# Patient Record
Sex: Female | Born: 1990
Health system: Southern US, Community
[De-identification: ages and names within clinical notes are randomized; demographics above are authoritative.]

## PROBLEM LIST (undated history)

## (undated) ENCOUNTER — Inpatient Hospital Stay (HOSPITAL_COMMUNITY): Payer: Self-pay

## (undated) DIAGNOSIS — R091 Pleurisy: Secondary | ICD-10-CM

## (undated) DIAGNOSIS — D509 Iron deficiency anemia, unspecified: Secondary | ICD-10-CM

## (undated) DIAGNOSIS — E039 Hypothyroidism, unspecified: Secondary | ICD-10-CM

## (undated) DIAGNOSIS — Z973 Presence of spectacles and contact lenses: Secondary | ICD-10-CM

## (undated) DIAGNOSIS — K90829 Short bowel syndrome, unspecified: Secondary | ICD-10-CM

## (undated) DIAGNOSIS — K912 Postsurgical malabsorption, not elsewhere classified: Secondary | ICD-10-CM

## (undated) DIAGNOSIS — K219 Gastro-esophageal reflux disease without esophagitis: Secondary | ICD-10-CM

## (undated) DIAGNOSIS — G43909 Migraine, unspecified, not intractable, without status migrainosus: Secondary | ICD-10-CM

## (undated) DIAGNOSIS — N3289 Other specified disorders of bladder: Secondary | ICD-10-CM

## (undated) DIAGNOSIS — Z8669 Personal history of other diseases of the nervous system and sense organs: Secondary | ICD-10-CM

## (undated) DIAGNOSIS — N3010 Interstitial cystitis (chronic) without hematuria: Secondary | ICD-10-CM

## (undated) DIAGNOSIS — M1990 Unspecified osteoarthritis, unspecified site: Secondary | ICD-10-CM

## (undated) DIAGNOSIS — J329 Chronic sinusitis, unspecified: Secondary | ICD-10-CM

## (undated) DIAGNOSIS — D649 Anemia, unspecified: Secondary | ICD-10-CM

## (undated) DIAGNOSIS — Z923 Personal history of irradiation: Secondary | ICD-10-CM

## (undated) DIAGNOSIS — L80 Vitiligo: Secondary | ICD-10-CM

## (undated) DIAGNOSIS — M3500 Sicca syndrome, unspecified: Secondary | ICD-10-CM

## (undated) DIAGNOSIS — Z87440 Personal history of urinary (tract) infections: Secondary | ICD-10-CM

## (undated) DIAGNOSIS — K909 Intestinal malabsorption, unspecified: Secondary | ICD-10-CM

## (undated) DIAGNOSIS — M329 Systemic lupus erythematosus, unspecified: Secondary | ICD-10-CM

## (undated) DIAGNOSIS — R310 Gross hematuria: Secondary | ICD-10-CM

## (undated) DIAGNOSIS — F419 Anxiety disorder, unspecified: Secondary | ICD-10-CM

## (undated) DIAGNOSIS — N3011 Interstitial cystitis (chronic) with hematuria: Secondary | ICD-10-CM

## (undated) DIAGNOSIS — M797 Fibromyalgia: Secondary | ICD-10-CM

## (undated) DIAGNOSIS — R399 Unspecified symptoms and signs involving the genitourinary system: Secondary | ICD-10-CM

## (undated) DIAGNOSIS — R195 Other fecal abnormalities: Secondary | ICD-10-CM

## (undated) DIAGNOSIS — IMO0002 Reserved for concepts with insufficient information to code with codable children: Secondary | ICD-10-CM

## (undated) DIAGNOSIS — Z8639 Personal history of other endocrine, nutritional and metabolic disease: Secondary | ICD-10-CM

## (undated) DIAGNOSIS — H9193 Unspecified hearing loss, bilateral: Secondary | ICD-10-CM

## (undated) HISTORY — DX: Sjogren syndrome, unspecified: M35.00

## (undated) HISTORY — DX: Unspecified osteoarthritis, unspecified site: M19.90

## (undated) HISTORY — DX: Interstitial cystitis (chronic) with hematuria: N30.11

## (undated) HISTORY — DX: Anemia, unspecified: D64.9

## (undated) HISTORY — DX: Intestinal malabsorption, unspecified: K90.9

## (undated) HISTORY — PX: WISDOM TOOTH EXTRACTION: SHX21

## (undated) HISTORY — DX: Anxiety disorder, unspecified: F41.9

## (undated) HISTORY — DX: Chronic sinusitis, unspecified: J32.9

## (undated) HISTORY — DX: Migraine, unspecified, not intractable, without status migrainosus: G43.909

## (undated) HISTORY — DX: Personal history of other endocrine, nutritional and metabolic disease: Z86.39

## (undated) HISTORY — PX: BOWEL RESECTION: SHX1257

## (undated) HISTORY — DX: Short bowel syndrome, unspecified: K90.829

## (undated) HISTORY — DX: Postsurgical malabsorption, not elsewhere classified: K91.2

## (undated) HISTORY — PX: SMALL INTESTINE SURGERY: SHX150

## (undated) HISTORY — DX: Unspecified hearing loss, bilateral: H91.93

## (undated) HISTORY — PX: LIVER SURGERY: SHX698

## (undated) HISTORY — DX: Interstitial cystitis (chronic) without hematuria: N30.10

## (undated) HISTORY — DX: Pleurisy: R09.1

---
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
## 2008-05-10 ENCOUNTER — Encounter: Admission: RE | Admit: 2008-05-10 | Discharge: 2008-05-10 | Payer: Self-pay | Admitting: Family Medicine

## 2010-07-23 ENCOUNTER — Encounter: Payer: Self-pay | Admitting: Urology

## 2010-11-17 NOTE — Discharge Summary (Signed)
[name], [name]           ACCOUNT NO.:  [id number]   MEDICAL RECORD NO.:  [id number]          PATIENT TYPE:  INP   LOCATION:  9308                          FACILITY:  [facility]   PHYSICIAN:  Gerri Spore B. [name], M.D.  DATE OF BIRTH:  17-Mar-1991   DATE OF ADMISSION:  01/04/2005  DATE OF DISCHARGE:                                 DISCHARGE SUMMARY   ADMISSION DIAGNOSIS:  Pelvic inflammatory disease with tubo-ovarian abscess.   DISCHARGE DIAGNOSIS:  Pelvic inflammatory disease with tubo-ovarian abscess.   HISTORY OF PRESENT ILLNESS:  A 20 year old African-American female who  presented to the office on the day of admission with an approximately four-  day history of right lower quadrant pain without fever, nausea or vomiting.  On exam, the patient was noted to have copious green vaginal discharge,  cervical motion tenderness and a right adnexal mass.  Ultrasound in the  office performed consistent with a right tubo-ovarian abscess.  The patient  was therefore admitted for intravenous antibiotics.   HOSPITAL COURSE:  On the day of admission, the patient was placed on the GYN  floor and started on intravenous gentamicin and Clindamycin.  Cultures for  gonorrhea and Chlamydia were taken in the office.  Results are pending at  this time.   Over the ensuing three days, the patient's pain dramatically improved.  She  remained afebrile throughout her stay with normal white count.   By the third hospital day, patient's pain had significantly improved.  She  was able to ambulate without pain, which was a big improvement.  She was  tolerating a regular diet and otherwise felt well.  She was therefore  discharged to home.   DISCHARGE MEDICATIONS:  1.  Doxycycline 100 mg p.o. b.i.d. x14 days.  2.  Flagyl 500 mg p.o. b.i.d. x14 days.  3.  Darvocet-N 100 one to two tablets every four to six hours p.r.n. pain.   ACTIVITY:  The patient is instructed to have light physical activity, no  heavy  lifting, running, etc.   FOLLOW-UP:  The patient is instructed to follow up in one week with Dr.  Earlene Bryan at [facility].  The patient is instructed to call with increasing  pain, fever, nausea or vomiting or other concerns.   DISPOSITION AT DISCHARGE:  Satisfactory and improved.     WBD/MEDQ  D:  01/06/2005  T:  01/06/2005  Job:  [id number]

## 2010-11-17 NOTE — H&P (Signed)
NAMERHIANA, MORASH NO.:  [id number]   MEDICAL RECORD NO.:  [id number]           PATIENT TYPE:   LOCATION:                                 FACILITY:   PHYSICIAN:  Gerri Spore B. Earlene Plater, M.D.       DATE OF BIRTH:   DATE OF ADMISSION:  DATE OF DISCHARGE:                                HISTORY & PHYSICAL   CHIEF COMPLAINT:  Right lower quadrant pain.   HISTORY OF PRESENT ILLNESS:  A 20 year old African-American female gravida 0  presents with a 4-day history of right lower quadrant pain with associated  nausea. Has been feeling somewhat fatigued but denies fevers or chills. Has  associated urinary frequency but no other urinary symptoms. History of  apparent ruptured ovarian cyst in May when she had right lower quadrant pain  and some free fluid noted on CT scan but negative exam in terms of cervical  discharge or cervical motion tenderness at that time. Has been sexually  active in the past although denies recent sexual activity.   In the office today the patient was noted to have marked cervical motion  tenderness and a right adnexal mass. Ultrasound showed an 8 cm complex mass  separate from the right ovary consistent with tuboovarian abscess. There was  also free fluid in the endometrial cavity. Gonorrhea and chlamydia cultures  were obtained.   PAST MEDICAL HISTORY:  1.  Short bowel syndrome.  2.  Vitiligo.   PAST SURGICAL HISTORY:  Intestinal resection after delivery.   MEDICATION:  Depo-Provera.   ALLERGIES:  PENICILLIN causes hives.   SOCIAL HISTORY:  No alcohol, tobacco, or other drugs.   FAMILY HISTORY:  Noncontributory.   REVIEW OF SYSTEMS:  Otherwise negative.   PHYSICAL EXAMINATION:  VITAL SIGNS:  Temperature 98.1, weight 99, blood  pressure 112/76.  GENERAL:  The patient is alert and oriented in no acute distress. Mildly ill-  appearing.  HEART:  Regular rate and rhythm.  LUNGS:  Clear to auscultation.  ABDOMEN:  Tender in the right lower  quadrant with no peritoneal signs noted.  PELVIC:  Normal external genitalia. Vagina:  There is a copious yellow-green  discharge. Cervix normal. Gonorrhea and chlamydia cultures are obtained.  Positive cervical motion tenderness and right adnexal tenderness and  fullness as outlined above.   LABORATORY DATA:  Urine pregnancy test is negative in the office. White  blood count is 8000 with normal differential. Ultrasound as outlined above  consistent with tuboovarian abscess.   ASSESSMENT:  Apparent pelvic inflammatory disease with right tuboovarian  abscess.   PLAN:  Admission for inpatient treatment with gentamycin and clindamycin  until shows clinical improvement.       WBD/MEDQ  D:  01/04/2005  T:  01/04/2005  Job:  [id number]   cc:   Lenox Nation, M.D.  [address]  [place]  [place] [address]  Fax: [phone]

## 2010-11-17 NOTE — Discharge Summary (Signed)
[name], [name]           ACCOUNT NO.:  [id number]   MEDICAL RECORD NO.:  [id number]          PATIENT TYPE:  INP   LOCATION:  6122                         FACILITY:  [facility]   PHYSICIAN:  Fortino Sic        DATE OF BIRTH:  05/22/1991   DATE OF ADMISSION:  08/23/2005  DATE OF DISCHARGE:                                 DISCHARGE SUMMARY   HOSPITAL COURSE:  [name] is a 20 year old African-American female with a  past medical history significant for a bowel resection as a neonate and tubo-  ovarian abscess in July 2006 with a history of chronic abdominal pain with  worsening left lower quadrant abdominal pain.  She was seen by her  gynecologist on February 21 and had a pelvic exam which was painful.  Her  wet prep was within normal limits and an ultrasound was within normal  limits.  She was admitted due to pain. She was started on doxycycline and  received a shot of rocephin at the gynecologists and started on IV  doxycycline and Cefotan on admission. Her abdominal pain improved and she  was able to walk and eat on February 23.  She was found to be GC and  Chlamydia positive.  It was felt that PID was likely the cause of her  abdominal pain.  Her treatment was discussed with her gynecologist consult  due to the continued recurrence of TIA.  They recommended a 14-day course of  doxycycline and Cipro and a test of cure following her treatment.  She was  also seen by pediatric surgery during this admission who felt  that she was  okay to go home and to followup on an as needed basis.   OPERATIONS AND PROCEDURES:  CBC white blood count 7.8, hemoglobin 13,  platelets 254,  GC and Chlamydia positive.   DIAGNOSIS:  Pelvic inflammatory disease.   MEDICATIONS:  1.  Doxycycline 100 mg p.o. b.i.d. for 14 days.  2.  Ciprofloxacin XR 100 mg daily x 14 days.   DISCHARGE WEIGHT:  46 kg.   DISCHARGE CONDITION:  Improved.   DISCHARGE INSTRUCTIONS AND FOLLOWUP:  She was to followup  with Dr. Earlene Plater on  March 12 at 4:15 for test of cure.   Dictated by Fortino Sic, PGY-1 dictated for Henrietta Hoover, MD           ______________________________  Fortino Sic    Jamie  D:  08/24/2005  T:  08/26/2005  Job:  [id number]   cc:   Henrietta Hoover, MD  Fax: [phone]

## 2011-03-22 LAB — CBC
HCT: 37
Hemoglobin: 12.7
MCV: 91.4
RBC: 4.04
WBC: 5.2

## 2011-03-22 LAB — WET PREP, GENITAL: Trich, Wet Prep: NONE SEEN

## 2011-03-22 LAB — HCG, QUANTITATIVE, PREGNANCY: hCG, Beta Chain, Quant, S: <2

## 2011-03-22 LAB — URINE MICROSCOPIC-ADD ON

## 2011-03-22 LAB — URINALYSIS, ROUTINE W REFLEX MICROSCOPIC
Glucose, UA: NEGATIVE
Nitrite: NEGATIVE

## 2011-03-22 LAB — POCT PREGNANCY, URINE: Operator id: 28886

## 2011-05-21 LAB — OB RESULTS CONSOLE HIV ANTIBODY (ROUTINE TESTING): HIV: NONREACTIVE

## 2011-09-06 ENCOUNTER — Encounter (HOSPITAL_COMMUNITY): Payer: Self-pay | Admitting: *Deleted

## 2011-09-06 ENCOUNTER — Inpatient Hospital Stay (HOSPITAL_COMMUNITY)
Admission: AD | Admit: 2011-09-06 | Discharge: 2011-09-06 | Disposition: A | Discharge status: Home / Self Care | Payer: BC Managed Care – PPO | Source: Ambulatory Visit | Attending: Obstetrics & Gynecology | Admitting: Obstetrics & Gynecology

## 2011-09-06 ENCOUNTER — Inpatient Hospital Stay (HOSPITAL_COMMUNITY): Payer: BC Managed Care – PPO

## 2011-09-06 DIAGNOSIS — O4700 False labor before 37 completed weeks of gestation, unspecified trimester: Secondary | ICD-10-CM | POA: Insufficient documentation

## 2011-09-06 DIAGNOSIS — O479 False labor, unspecified: Secondary | ICD-10-CM

## 2011-09-06 LAB — URINALYSIS, ROUTINE W REFLEX MICROSCOPIC
Bilirubin Urine: NEGATIVE
Glucose, UA: NEGATIVE mg/dL
Hgb urine dipstick: NEGATIVE
Specific Gravity, Urine: <1.005 — ABNORMAL LOW (ref 1.005–1.030)

## 2011-09-06 LAB — URINE MICROSCOPIC-ADD ON

## 2011-09-06 MED ORDER — LACTATED RINGERS IV SOLN
Freq: Once | INTRAVENOUS | Status: AC
  Administered 2011-09-06: 19:00:00 via INTRAVENOUS

## 2011-09-06 MED ORDER — LACTATED RINGERS IV SOLN: INTRAVENOUS | Status: DC

## 2011-09-06 NOTE — Progress Notes (Signed)
Pt complaining of abdominal pressure since Sunday

## 2011-09-06 NOTE — Discharge Instructions (Signed)

## 2011-09-06 NOTE — ED Provider Notes (Signed)
History   [name] is a 21 y.o. G3P0020 at [redacted]w[redacted]d. Hx ectopic and 14 wks loss. C/O pelvic pressure and discomfort for past few days, intermittent, worse today after trying to go to work. Not sure if contractions.  Denies dysuria / V/B / LOF / discharge. Good FM.  CUS at 19 wks  w/ nl CL (3.5cm).   PN course sig for hypothyroid on levothyroxine, stable TSH at 16 wks. Hx short bowel syndrome with chronic constipation. Hx chlamydia tx in early pregnancy.  Chief Complaint  Patient presents with  . Abdominal Pain   HPI  OB History    Grav Para Term Preterm Abortions TAB SAB Ect Mult Living   3 0 0 0 0 0 1 1 0 0       Past Medical History  Diagnosis Date  . Thyroid disease     Past Surgical History  Procedure Date  . Liver patched   . Intestines removed   . Knee surgery     Family History  Problem Relation Age of Onset  . Diabetes Father   . Hypertension Father     History  Substance Use Topics  . Smoking status: Never Smoker   . Smokeless tobacco: Not on file  . Alcohol Use: No    Allergies:  Allergies  Allergen Reactions  . Penicillins Anaphylaxis  . Morphine And Related Itching    Prescriptions prior to admission  Medication Sig Dispense Refill  . levothyroxine (SYNTHROID, LEVOTHROID) 150 MCG tablet Take 150 mcg by mouth daily.      . Magnesium 500 MG TABS Take 1 tablet by mouth once a week.      . Prenatal Vit-Fe Fumarate-FA (PRENATAL MULTIVITAMIN) TABS Take 1 tablet by mouth daily.        Review of Systems  Constitutional: Negative for fever and chills.  Eyes: Negative for blurred vision.  Gastrointestinal: Positive for constipation (usually BM q 4-6 days - hx short bowel). Negative for heartburn, nausea and vomiting.  Genitourinary: Negative for dysuria and urgency.  Neurological: Positive for headaches.   Physical Exam   Blood pressure 115/79, pulse 90, resp. rate 18, SpO2 99.00%.  Physical Exam  Constitutional: She is oriented to  person, place, and time. She appears well-developed and well-nourished. No distress.  HENT:  Head: Normocephalic.  Eyes: Pupils are equal, round, and reactive to light.  Neck: Normal range of motion.  Cardiovascular: Normal rate.   Respiratory: Breath sounds normal.  GI: Soft. Bowel sounds are normal. She exhibits no distension.       Gravid, S=D  Genitourinary:       Pelvic deferred (no available private bed in MAU, patient in hallway curtain area)  Musculoskeletal: Normal range of motion. She exhibits no edema.  Neurological: She is alert and oriented to person, place, and time. She has normal reflexes.  Skin: Skin is warm and dry.  Psychiatric: She has a normal mood and affect.   FHT's 150's, moderate variability, small variables, tracing appropriate for gestational age Toco: ctx q 9 min and in between irritability, mild, patient unaware  MAU Course  Procedures IV fluids LR bolus 500cc given  sono for cervical length done, CL = 4 cm, no funneling / change with valsalva or fundal pressure   Assessment and Plan  IUP ay 22w 4d Preterm contractions w/o progressive cervical change, reassuring findings w/ TVUS Reassuring FWB  No private area available immediately for collection of wet prep and GC/CC swab.  Plan f/u in  office tomorrow AM for pelvic exam and collection of labs, need to r/o vaginosis as possible cause uterine irritability UA and UCX pending D/C home w/ PTL precautions  Bland Rudzinski 09/06/2011, 7:37 PM

## 2011-09-08 LAB — URINE CULTURE
Colony Count: NO GROWTH
Culture  Setup Time: 201303080341

## 2011-09-22 ENCOUNTER — Inpatient Hospital Stay (HOSPITAL_COMMUNITY)
Admission: AD | Admit: 2011-09-22 | Discharge: 2011-09-22 | Disposition: A | Discharge status: Home / Self Care | Payer: BC Managed Care – PPO | Source: Ambulatory Visit | Attending: Obstetrics and Gynecology | Admitting: Obstetrics and Gynecology

## 2011-09-22 ENCOUNTER — Encounter (HOSPITAL_COMMUNITY): Payer: Self-pay | Admitting: Obstetrics and Gynecology

## 2011-09-22 DIAGNOSIS — M549 Dorsalgia, unspecified: Secondary | ICD-10-CM

## 2011-09-22 DIAGNOSIS — M545 Low back pain, unspecified: Secondary | ICD-10-CM | POA: Insufficient documentation

## 2011-09-22 DIAGNOSIS — O99891 Other specified diseases and conditions complicating pregnancy: Secondary | ICD-10-CM | POA: Insufficient documentation

## 2011-09-22 DIAGNOSIS — R109 Unspecified abdominal pain: Secondary | ICD-10-CM | POA: Insufficient documentation

## 2011-09-22 LAB — URINE MICROSCOPIC-ADD ON

## 2011-09-22 LAB — URINALYSIS, ROUTINE W REFLEX MICROSCOPIC
Bilirubin Urine: NEGATIVE
Glucose, UA: NEGATIVE mg/dL
Hgb urine dipstick: NEGATIVE
Ketones, ur: NEGATIVE mg/dL
Nitrite: NEGATIVE
Protein, ur: NEGATIVE mg/dL
Specific Gravity, Urine: 1.02 (ref 1.005–1.030)
Urobilinogen, UA: 0.2 mg/dL (ref 0.0–1.0)
pH: 6.5 (ref 5.0–8.0)

## 2011-09-22 LAB — FETAL FIBRONECTIN: Fetal Fibronectin: NEGATIVE

## 2011-09-22 MED ORDER — IBUPROFEN 600 MG PO TABS: 600.0000 mg | ORAL_TABLET | Freq: Three times a day (TID) | ORAL | Status: AC

## 2011-09-22 NOTE — MAU Note (Signed)
Pt presents to mau with chief complaint of abdominal pain and back pain that started at 1545 today. Pt is 109w6d, pt was at work when the pain started and felt she could not continue to work bc the pain was so intense. Pt says the back pain is constant and the abdominal pain comes and goes. The pain is accompanied by pelvic pressure.

## 2011-09-22 NOTE — MAU Provider Note (Signed)
  History   CSN: [id number]  Arrival date and time: 09/22/11 1801 Phone call from nurse and orders placed at 1845 Provider arrival to see patient at 1930  Chief Complaint  Patient presents with  . Abdominal Pain  . Back Pain   HPI Comments: Came from work due to abdominal and back pain this afternoon starting at 1700.  No bleeding or vaginal discharge.   Back pain - right flank and low back. Worse at work while sitting at desk.  Denies spasms / heat effective in pain relief.  Recently treated for chlamydia in past 2 weeks.  Managed by Dr Talmage Nap for uncontrolled hypothyroidism / complicated by non-compliance with medication administration.   Past Medical History  Diagnosis Date  . Thyroid disease     Past Surgical History  Procedure Date  . Liver patched   . Intestines removed   . Knee surgery     Family History  Problem Relation Age of Onset  . Diabetes Father   . Hypertension Father     History  Substance Use Topics  . Smoking status: Never Smoker   . Smokeless tobacco: Not on file  . Alcohol Use: No    Allergies:  Allergies  Allergen Reactions  . Penicillins Anaphylaxis  . Morphine And Related Itching    Prescriptions prior to admission  Medication Sig Dispense Refill  . levothyroxine (SYNTHROID, LEVOTHROID) 175 MCG tablet Take 175 mcg by mouth daily.      . Magnesium 500 MG TABS Take 1 tablet by mouth once a week.      . Prenatal Vit-Fe Fumarate-FA (PRENATAL MULTIVITAMIN) TABS Take 1 tablet by mouth daily.        ROS Physical Exam   Blood pressure 110/74, pulse 90, temperature 98.1 F (36.7 C), temperature source Oral, resp. rate 18.  Labs: Urinalysis: 1020 spec gravity / (+) leukocytes / (-) nitrites Fetal fibronectin - negative  Physical Exam  Constitutional: She is oriented to person, place, and time. She appears well-developed and well-nourished.  Cardiovascular: Normal rate and regular rhythm.   Respiratory: Effort normal.  GI: Soft. She  exhibits no distension. There is no tenderness. There is no CVA tenderness.  Genitourinary: Vagina normal. There is no rash or lesion on the right labia. There is no rash or lesion on the left labia.       Uterus non-tender / gravid Cervix: closed / soft / long  Musculoskeletal: Normal range of motion.  Neurological: She is alert and oriented to person, place, and time.  Skin: Skin is warm and dry.  Psychiatric: She has a normal mood and affect.   MAU Course  Procedures  Assessment and Plan   24 weeks 5/7 days pregnant No evidence of PTL Musculoskeletal pain  - low back pain from lordosis of pregnancy  1)  Discharge home 2)  Supportive management - topical heat / 48 hours of motrin 3) OOW tomorrow - return to work next scheduled day (Wed)  [name] 09/22/2011, 7:46 PM

## 2011-09-22 NOTE — Discharge Instructions (Signed)
Back Pain in Pregnancy Back pain during pregnancy is common. It happens in about half of all pregnancies. It is important for you and your baby that you remain active during your pregnancy.If you feel that back pain is not allowing you to remain active or sleep well, it is time to see your caregiver. Back pain may be caused by several factors related to changes during your pregnancy.  Low back pain usually occurs between the fifth and seventh months of pregnancy.  Factors that increase the risk of back problems include: previous back problems,injury to your back, twins, chronic cough, stress, job-related repetitive motions,muscle or spinal disease in the back.  CAUSES   When you are pregnant, your body produces a hormone called relaxin. This hormonemakes the ligaments connecting the low back and pubic bones more flexible. This flexibility allows the baby to be delivered more easily. When your ligaments are loose, your muscles need to work harder to support your back. Soreness in your back can come from tired muscles. Soreness can also come from back tissues that are irritated since they are receiving less support.   As the baby grows, it puts pressure on the nerves and blood vessels in your pelvis. This can cause back pain.   As the baby grows and gets heavier during pregnancy, the uterus pushes the stomach muscles forward and changes your center of gravity. This makes your back muscles work harder to maintain good posture.  SYMPTOMS  Lumbar pain during pregnancy Lumbar pain during pregnancy usually occurs at or above the waist in the center of the back. There may be pain and numbness that radiates into your leg or foot. This is similar to low back pain experienced by non-pregnant women. It usually increases with sitting for long periods of time, standing, or repetitive lifting. Tenderness may also be present in the muscles along your upper back. Posterior pelvic pain during pregnancy Pain in the  back of the pelvis is more common than lumbar pain in pregnancy. It is a deep pain felt in your side at the waistline, or across the tailbone (sacrum), or in both places. You may have pain on one or both sides. This pain can also go into the buttocks and backs of the upper thighs. Pubic and groin pain may also be present. The pain does not quickly resolve with rest, and morning stiffness may also be present. Pelvic pain during pregnancy can be brought on by most activities. A high level of fitness before and during pregnancy may or may not prevent this problem. Labor pain is usually 1 to 2 minutes apart, lasts for about 1 minute, and involves a bearing down feeling or pressure in your pelvis. However, if you are at term with the pregnancy, constant low back pain can be the beginning of early labor, and you should be aware of this. HOME CARE INSTRUCTIONS  Do not stand in one place for long periods of time.   Do not wear high heels.   Sit in chairs with good posture. Use a pillow on your lower back if necessary. Make sure your head rests over your shoulders and is not hanging forward.   Try sleeping on your side with a pillow or two between your legs. If you are sore after a night's rest, your bedmay betoo soft.Try placing a board between your mattress and box spring.   Listen to your body when lifting.If you are experiencing pain, ask for help or try bending yourknees more so you can  use your leg muscles rather than your back muscles. Squat down when picking up something from the floor. Do not bend over.   Eat a healthy diet.   Use heat or cold packs 3 to 4 times a day for 15 minutes to help with the pain.   Only take over-the-counter or prescription medicines for pain as directed by your caregiver.    Continued back pain See your caregiver if you have continued problems. Your caregiver can help or refer you for appropriate physical therapy. With conditioning, most back problems can be  avoided. Sometimes, a more serious issue may be the cause of back pain. You should be seen right away if new problems seem to be developing. Your caregiver may recommend:  A maternity girdle.   A back brace.   A massage therapist or acupuncture.  SEEK MEDICAL CARE IF:   You are not able to do most of your daily activities, even when taking the pain medicine you were given.   You need a referral to a physical therapist or chiropractor.   You want to try acupuncture.  SEEK IMMEDIATE MEDICAL CARE IF:  You develop numbness, tingling, weakness, or problems with the use of your arms or legs.   You develop severe back pain that is no longer relieved with medicines.   You have a sudden change in bowel or bladder control.   You have increasing pain in other areas of the body.   You develop shortness of breath, dizziness, or fainting.   You develop nausea, vomiting, or sweating.   You have back pain which is similar to labor pains.   You have back pain along with your water breaking or vaginal bleeding.   You have back pain or numbness that travels down your leg.   Your back pain developed after you fell.   You develop pain on one side of your back. You may have a kidney stone.   You see blood in your urine. You may have a bladder infection or kidney stone.   You have back pain with blisters. You may have shingles.    Document Released: 09/26/2005 Document Revised: 06/07/2011 Document Reviewed: 11/07/2010 [facility] Patient Information 2012 [place], [place].

## 2011-09-23 LAB — URINE CULTURE
Colony Count: NO GROWTH
Culture  Setup Time: 201303240151
Culture: NO GROWTH

## 2011-10-16 LAB — OB RESULTS CONSOLE GC/CHLAMYDIA
Chlamydia: NEGATIVE
Gonorrhea: NEGATIVE

## 2011-10-16 LAB — OB RESULTS CONSOLE RUBELLA ANTIBODY, IGM: Rubella: IMMUNE

## 2011-11-02 ENCOUNTER — Inpatient Hospital Stay (HOSPITAL_COMMUNITY)
Admission: AD | Admit: 2011-11-02 | Discharge: 2011-11-02 | Disposition: A | Discharge status: Home / Self Care | Payer: BC Managed Care – PPO | Source: Ambulatory Visit | Attending: Obstetrics and Gynecology | Admitting: Obstetrics and Gynecology

## 2011-11-02 ENCOUNTER — Encounter (HOSPITAL_COMMUNITY): Payer: Self-pay | Admitting: *Deleted

## 2011-11-02 DIAGNOSIS — IMO0002 Reserved for concepts with insufficient information to code with codable children: Secondary | ICD-10-CM | POA: Insufficient documentation

## 2011-11-02 DIAGNOSIS — E039 Hypothyroidism, unspecified: Secondary | ICD-10-CM | POA: Insufficient documentation

## 2011-11-02 DIAGNOSIS — E079 Disorder of thyroid, unspecified: Secondary | ICD-10-CM | POA: Insufficient documentation

## 2011-11-02 HISTORY — DX: Hypothyroidism, unspecified: E03.9

## 2011-11-02 LAB — URINALYSIS, DIPSTICK ONLY
Bilirubin Urine: NEGATIVE
Glucose, UA: NEGATIVE mg/dL
Hgb urine dipstick: NEGATIVE
Ketones, ur: NEGATIVE mg/dL
Nitrite: NEGATIVE
Protein, ur: NEGATIVE mg/dL
Specific Gravity, Urine: 1.02 (ref 1.005–1.030)
Urobilinogen, UA: 1 mg/dL (ref 0.0–1.0)
pH: 6 (ref 5.0–8.0)

## 2011-11-02 NOTE — Discharge Instructions (Signed)
  Out of work until Monday   HOME CARE  Eat a healthy diet.   Take your vitamins as told by your doctor.   Take your prescribed medications daily as instructed.  Drink enough fluids to keep your pee (urine) clear or pale yellow every day.   Get rest and sleep - out of work until end of weekend.  Follow your doctor's advice about activity, medicines, and tests.   Do not smoke.    GET HELP RIGHT AWAY IF:   You are having painful contractions.   You have bleeding from your vagina.   You have pain when you pee (urinate).   You have abnormal discharge from your vagina.   You have a temperature by mouth above 102 F (38.9 C).   Vomiting and unable to keep down fluids. MAKE SURE YOU:  Understand these instructions.   Will watch your condition.   Will get help if you are not doing well or get worse.    Document Released: 09/14/2008 Document Revised: 06/07/2011 Document Reviewed: 09/14/2008 [facility] Patient Information 2012 [place], [place].

## 2011-11-02 NOTE — Progress Notes (Signed)
Patient ID: [name], female   DOB: 01-Nov-1990, 21 y.o.   MRN: [id number]  S: feeling bad today - really tired / dizzy / fever last night and this am (felt hot - did not take temp)     Constant nausea all day - no vomiting      No pain / no LOF / no bleeding     Normal BM - no diarrhea - some cramping and backache     No URI symptoms     + FM      Some braxton-hicks ctx     Thinks she may have passed out at work -could not hear coworkers talking to her         Laid down on floor at work because she was so tired and dizzy        No evidence of syncope - patient remembers lying down        O: 98.1 - 101 - 24 - 110/71      Alert and oriented / NAD / texting on phone & watching tv      Heart RR - mild tachycardia 100      Lungs - unlabored      Abdomen - soft / non-tender / non-distended / gravid with non-tender uterus      FHR 150 moderate variability / no decels / no accels      Toco - rare ctx with some intermittent UI       Defer VE       Extremities - no edema  A/P: 30 weeks general malaise possible viral syndrome         Hypothyroidism - uncontrolled during pregnancy         No evidence of PTL  Recommend home  Rest Hydration  Light diet  Call if vomiting or diarrhea starts / no feeling well by Monday Keep apt with Endocrinologist WOB apt 1 week

## 2011-12-06 ENCOUNTER — Observation Stay (HOSPITAL_COMMUNITY)
Admission: AD | Admit: 2011-12-06 | Discharge: 2011-12-07 | Disposition: A | Discharge status: Home / Self Care | Payer: BC Managed Care – PPO | Source: Ambulatory Visit | Attending: Obstetrics & Gynecology | Admitting: Obstetrics & Gynecology

## 2011-12-06 ENCOUNTER — Encounter (HOSPITAL_COMMUNITY): Payer: Self-pay | Admitting: *Deleted

## 2011-12-06 DIAGNOSIS — O99280 Endocrine, nutritional and metabolic diseases complicating pregnancy, unspecified trimester: Secondary | ICD-10-CM | POA: Insufficient documentation

## 2011-12-06 DIAGNOSIS — O479 False labor, unspecified: Secondary | ICD-10-CM | POA: Diagnosis present

## 2011-12-06 DIAGNOSIS — E039 Hypothyroidism, unspecified: Secondary | ICD-10-CM | POA: Insufficient documentation

## 2011-12-06 DIAGNOSIS — E079 Disorder of thyroid, unspecified: Secondary | ICD-10-CM | POA: Insufficient documentation

## 2011-12-06 DIAGNOSIS — O4700 False labor before 37 completed weeks of gestation, unspecified trimester: Principal | ICD-10-CM | POA: Diagnosis present

## 2011-12-06 MED ORDER — ZOLPIDEM TARTRATE 10 MG PO TABS
10.0000 mg | ORAL_TABLET | Freq: Every evening | ORAL | Status: DC | PRN
  Administered 2011-12-07: 10 mg via ORAL
  Filled 2011-12-06: qty 1

## 2011-12-06 MED ORDER — PRENATAL MULTIVITAMIN CH: 1.0000 | ORAL_TABLET | Freq: Every day | ORAL | Status: DC

## 2011-12-06 MED ORDER — LACTATED RINGERS IV SOLN
INTRAVENOUS | Status: DC
  Administered 2011-12-06 – 2011-12-07 (×2): via INTRAVENOUS

## 2011-12-06 MED ORDER — ACETAMINOPHEN 325 MG PO TABS: 650.0000 mg | ORAL_TABLET | ORAL | Status: DC | PRN

## 2011-12-06 MED ORDER — CALCIUM CARBONATE ANTACID 500 MG PO CHEW: 2.0000 | CHEWABLE_TABLET | ORAL | Status: DC | PRN

## 2011-12-06 MED ORDER — DOCUSATE SODIUM 100 MG PO CAPS: 100.0000 mg | ORAL_CAPSULE | Freq: Every day | ORAL | Status: DC

## 2011-12-06 NOTE — MAU Provider Note (Signed)
OB ADMISSION/ HISTORY & PHYSICAL:  Admission Date: 12/06/2011  8:16 PM  Admit Diagnosis: Intrauterine pregnancy at 35 4/7 wks Preterm contractions  [name] is a 21 y.o. female presenting for painful abdominal cramping and rectal pressure w/ ctx. Started feeling ctx over the weekend, worsened since this afternoon. Normal BM at 6pm, no relief from cramping. SVE by triage RN 2 hrs ago 1-2 cm. Multiple episodes of chlamydia this pregnancy, last tx 12/03/2011 along w/ partner.   Prenatal History: G3P0020   EDC : 01/06/2012, by Other Basis  Prenatal care at [facility] since [redacted] weeks gestation  Prenatal course complicated by hypothyroidism, short bowel syndrome (preemie), vitiligo, multiple chlamydia episodes, poor dentition / cavities, non compliance  Prenatal Labs: ABO, Rh:   O pos Antibody:  neg Rubella:   immune RPR:   neg HBsAg:   neg HIV:   neg GBS:   neg 1 hr Glucola : 78 Quad screen neg Ultrasound nl female anatomy, growth sono 32 wks EFW 52%, nl AFI   Medical / Surgical History :  Past medical history:  Past Medical History  Diagnosis Date  . Thyroid disease   . Hypothyroidism   . Tachycardia   . Headache      Past surgical history:  Past Surgical History  Procedure Date  . Liver patched   . Intestines removed   . Knee surgery      Family History:  Family History  Problem Relation Age of Onset  . Diabetes Father   . Hypertension Father      Social History:  reports that she has never smoked. She does not have any smokeless tobacco history on file. She reports that she does not drink alcohol or use illicit drugs.   Allergies: Penicillins and Morphine and related    Current Medications at time of admission:  Prescriptions prior to admission  Medication Sig Dispense Refill  . acetaminophen (TYLENOL) 500 MG tablet Take 500 mg by mouth once.      . cyclobenzaprine (FLEXERIL) 10 MG tablet Take 5 mg by mouth 3 (three) times daily as  needed. Pian in back      . levothyroxine (SYNTHROID, LEVOTHROID) 175 MCG tablet Take 175 mcg by mouth daily.      . Prenatal Vit-Fe Fumarate-FA (PRENATAL MULTIVITAMIN) TABS Take 1 tablet by mouth daily.          Review of Systems: Pain w/ ctx, rates 10/10 in the back, able to hold conversation during ctx and appears comfortable.  No LOF, small show, no N/V. + fetal movement   Physical Exam:  Dilation: 1.5 Effacement (%): 60 Station: -2 Exam by:: Dorrene German RN Filed Vitals:   12/06/11 2024  BP: 111/75  Pulse: 102  Temp: 98.5 F (36.9 C)  TempSrc: Oral  Resp: 20   General: AAO x 3, NAD Heart: RRR Lungs: CTAB Abdomen: gravid, NT, uterine ctx palp mild, EFW 5.5 Extremities: no edema Genitalia / VE: 1-2 / 70 / -1, + show FHR: 150, moderate variability, no decel's, + accels TOCO: ctx q 1-6, mild  Labs:    No results found for this basename: WBC:2,HGB:2,HCT:2,PLT:2 in the last 72 hours   Assessment: IUP at 35 4/7 wks Preterm contractions Patient is poor historian with known noncompliance  Plan admit to antepartum for observation, IV hydration, reevaluate in am If progressive cervical change will admit to L&D Plan Azithromycin 1 gm IV in labor given heavy hx of chlamydia   Dr. Juliene Pina  consult, agrees.  Ishaaq Penna 12/06/2011, 10:30 PM

## 2011-12-06 NOTE — Progress Notes (Signed)
CNM informed of pt status, requests that RN perform SVE.

## 2011-12-06 NOTE — MAU Note (Signed)
Pt came in EMS, uc's since Saturday, pt began having pelvic pressure tonight @ 1815, uc's are stronger, pt denies bleeding or LOF.  Reports decreased FM

## 2011-12-06 NOTE — Progress Notes (Signed)
CNM notified of SVE 1-2 cm's, states pt to be observed & rechecked in an hour, is coming to see pt.

## 2011-12-07 ENCOUNTER — Encounter (HOSPITAL_COMMUNITY): Payer: Self-pay

## 2011-12-07 DIAGNOSIS — O479 False labor, unspecified: Secondary | ICD-10-CM | POA: Diagnosis present

## 2011-12-07 MED ORDER — ZOLPIDEM TARTRATE 10 MG PO TABS: 5.0000 mg | ORAL_TABLET | Freq: Every evening | ORAL | Status: DC | PRN

## 2011-12-07 NOTE — Progress Notes (Signed)
UR Chart review completed.  

## 2011-12-07 NOTE — MAU Provider Note (Signed)
Reviewed, agree with note and plan.  

## 2011-12-07 NOTE — Discharge Summary (Signed)
Patient ID: [name] MRN: [id number] DOB/AGE: 10/24/1990 21 y.o.  Admit date: 12/06/2011 Discharge date:  12/07/2011  Admission Diagnoses:  Intrauterine pregnancy at 35 /47 weeks Preterm contractions  Discharge Diagnoses:   intrauterine pregnancy at 35 6/7 weeks, undelivered Preterm contractions without cervical change  Prenatal history: G3P0020  EDC : 01/06/2012, by Other Basis  Prenatal care at [facility] since [redacted] weeks gestation  Prenatal course complicated by hypothyroidism, short bowel syndrome (preemie), vitiligo, multiple chlamydia episodes, poor dentition / cavities, non compliance  Prenatal Labs:  ABO, Rh: O pos  Antibody: neg  Rubella: immune  RPR: neg  HBsAg: neg  HIV: neg  GBS: neg  1 hr Glucola : 78  Quad screen neg  Ultrasound nl female anatomy, growth sono 32 wks EFW 52%, nl AFI  Medical / Surgical History :  Past medical history:  Past Medical History  Diagnosis Date  . Thyroid disease   . Hypothyroidism   . Tachycardia   . Headache   . Preterm contractions 12/07/2011    Past surgical history:  Past Surgical History  Procedure Date  . Liver patched   . Intestines removed   . Knee surgery     Family History:  Family History  Problem Relation Age of Onset  . Diabetes Father   . Hypertension Father     Social History:  reports that she has never smoked. She does not have any smokeless tobacco history on file. She reports that she does not drink alcohol or use illicit drugs.  Allergies: Penicillins and Morphine and related   Current Medications at time of admission:  Prior to Admission medications   Medication Sig Start Date End Date Taking? Authorizing Provider  acetaminophen (TYLENOL) 500 MG tablet Take 500 mg by mouth once.   Yes Historical Provider, MD  levothyroxine (SYNTHROID, LEVOTHROID) 175 MCG tablet Take 175 mcg by mouth daily.   Yes Historical Provider, MD  Prenatal Vit-Fe Fumarate-FA (PRENATAL  MULTIVITAMIN) TABS Take 1 tablet by mouth daily.   Yes Historical Provider, MD  zolpidem (AMBIEN) 10 MG tablet Take 0.5 tablets (5 mg total) by mouth at bedtime as needed for sleep. 12/07/11 01/06/12  Arlan Organ, [facility] Course:  Patient was maintained overnight for observation, slept well with Ambien. Intravenous fluids, Lactated Ringers 500 cc bolus then maintained at 125 cc/hour. In AM cervical exam remained unchanged and patient was discharged home with labor precautions. 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 Ambien PRN given for sleep.     Physical Exam:   VSS: Blood pressure 113/73, pulse 105, temperature 98.9 F (37.2 C), temperature source Oral, resp. rate 20, height 5\' 1"  (1.549 m), weight 53.071 kg (117 lb).  General: AAOX3, NAD Heart: RRR Lungs:CTAB Abdomen: gravid, non tender, mild contractions palpated Extremities: No edema Pelvic: cervix 1-2 / 70 / -1, midline Fetal assessment: baseline 130, + accel's, occasional small variables 10 BPM, moderate variability TOCO: ctx 5-8 min, mild  Discharge Instructions:  Discharged Condition: stable Activity: pelvic rest Diet: routine Medications:  Medication List  As of 12/07/2011  7:53 AM   STOP taking these medications         cyclobenzaprine 10 MG tablet         TAKE these medications         acetaminophen 500 MG tablet   Commonly  known as: TYLENOL   Take 500 mg by mouth once.      levothyroxine 175 MCG tablet   Commonly known as: SYNTHROID, LEVOTHROID   Take 175 mcg by mouth daily.      prenatal multivitamin Tabs   Take 1 tablet by mouth daily.      zolpidem 10 MG tablet   Commonly known as: AMBIEN   Take 0.5 tablets (5 mg total) by mouth at bedtime as needed for sleep.           Condition: stable Discharge Instructions: preterm labor instructions. Increased rest, increased hydration.  Fetal movement counts daily.  Discharge to: home Disposition: 01-Home or Self Care Follow up :  Follow-up Information    Follow up with [facility], CNM. (Keep scheduled appointment on Tuesday)    Contact information:   [address] [place] [address] [phone]           Signed: Arlan Organ 12/07/2011, 7:53 AM

## 2011-12-07 NOTE — Discharge Instructions (Signed)
See printed instructions

## 2011-12-07 NOTE — Progress Notes (Signed)
S: Slept through the night after Ambien. This AM, patient noted to have urinary incontinence during the night, wet bed. Reports ctx still present, not as intense as last night.   O: BP 113/73  Pulse 105  Temp(Src) 98.9 F (37.2 C) (Oral)  Resp 20  Ht 5\' 1"  (1.549 m)  Wt 53.071 kg (117 lb)  BMI 22.11 kg/m2   XBJ:YNWGNFAO: 150 bpm Toco: palp no ctx at this time, will trace toco next 30 min.  ZHY:QMVHQION: 1.5 Effacement (%): 70 Cervical Position: Middle Station: -1 Presentation: Vertex No show with exam Exam by:: Colon Flattery CNM  A/P- 21 y.o. admitted with preterm contractions  Preterm labor management: pelvic rest advised, preterm labor education provided  Dating:  [redacted]w[redacted]d  FWB:  Category 1 PTL:  No progressive cervical change through the night Will DC home after breakfast, Ambien 5 mg PRN at HS for sleep ROD: plan expectant management for vaginal birth    [facility] 12/07/2011 7:50 AM

## 2011-12-09 NOTE — Discharge Summary (Signed)
Reviewed, patient discussed, agree with above note and plan--V.Juliene Pina, MD

## 2011-12-28 ENCOUNTER — Encounter (HOSPITAL_COMMUNITY): Payer: Self-pay | Admitting: *Deleted

## 2011-12-28 ENCOUNTER — Inpatient Hospital Stay (HOSPITAL_COMMUNITY): Payer: BC Managed Care – PPO

## 2011-12-28 ENCOUNTER — Inpatient Hospital Stay (HOSPITAL_COMMUNITY)
Admission: AD | Admit: 2011-12-28 | Discharge: 2012-01-05 | DRG: 370 | Disposition: A | Discharge status: Home / Self Care | Payer: BC Managed Care – PPO | Source: Ambulatory Visit | Attending: Obstetrics | Admitting: Obstetrics

## 2011-12-28 DIAGNOSIS — IMO0002 Reserved for concepts with insufficient information to code with codable children: Secondary | ICD-10-CM | POA: Diagnosis present

## 2011-12-28 DIAGNOSIS — O411290 Chorioamnionitis, unspecified trimester, not applicable or unspecified: Secondary | ICD-10-CM | POA: Diagnosis present

## 2011-12-28 DIAGNOSIS — O99892 Other specified diseases and conditions complicating childbirth: Secondary | ICD-10-CM | POA: Diagnosis present

## 2011-12-28 DIAGNOSIS — O368190 Decreased fetal movements, unspecified trimester, not applicable or unspecified: Secondary | ICD-10-CM | POA: Diagnosis present

## 2011-12-28 DIAGNOSIS — Z22330 Carrier of Group B streptococcus: Secondary | ICD-10-CM

## 2011-12-28 DIAGNOSIS — O479 False labor, unspecified: Secondary | ICD-10-CM

## 2011-12-28 DIAGNOSIS — O99280 Endocrine, nutritional and metabolic diseases complicating pregnancy, unspecified trimester: Secondary | ICD-10-CM | POA: Diagnosis present

## 2011-12-28 DIAGNOSIS — J069 Acute upper respiratory infection, unspecified: Secondary | ICD-10-CM | POA: Diagnosis present

## 2011-12-28 DIAGNOSIS — D62 Acute posthemorrhagic anemia: Secondary | ICD-10-CM | POA: Diagnosis not present

## 2011-12-28 DIAGNOSIS — O2390 Unspecified genitourinary tract infection in pregnancy, unspecified trimester: Secondary | ICD-10-CM | POA: Diagnosis present

## 2011-12-28 DIAGNOSIS — E079 Disorder of thyroid, unspecified: Secondary | ICD-10-CM | POA: Diagnosis present

## 2011-12-28 DIAGNOSIS — O2300 Infections of kidney in pregnancy, unspecified trimester: Secondary | ICD-10-CM | POA: Diagnosis present

## 2011-12-28 DIAGNOSIS — A498 Other bacterial infections of unspecified site: Secondary | ICD-10-CM | POA: Diagnosis present

## 2011-12-28 DIAGNOSIS — E039 Hypothyroidism, unspecified: Secondary | ICD-10-CM | POA: Diagnosis present

## 2011-12-28 DIAGNOSIS — O9903 Anemia complicating the puerperium: Secondary | ICD-10-CM | POA: Diagnosis not present

## 2011-12-28 DIAGNOSIS — N12 Tubulo-interstitial nephritis, not specified as acute or chronic: Secondary | ICD-10-CM | POA: Diagnosis present

## 2011-12-28 LAB — CBC
HCT: 29.4 % — ABNORMAL LOW (ref 36.0–46.0)
Hemoglobin: 9.9 g/dL — ABNORMAL LOW (ref 12.0–15.0)
MCH: 29.5 pg (ref 26.0–34.0)
MCHC: 33.7 g/dL (ref 30.0–36.0)
MCV: 87.5 fL (ref 78.0–100.0)
Platelets: 251 10*3/uL (ref 150–400)
RBC: 3.36 MIL/uL — ABNORMAL LOW (ref 3.87–5.11)
RDW: 14.9 % (ref 11.5–15.5)
WBC: 14.5 10*3/uL — ABNORMAL HIGH (ref 4.0–10.5)

## 2011-12-28 LAB — COMPREHENSIVE METABOLIC PANEL
ALT: 5 U/L (ref 0–35)
AST: 10 U/L (ref 0–37)
Albumin: 2 g/dL — ABNORMAL LOW (ref 3.5–5.2)
Alkaline Phosphatase: 286 U/L — ABNORMAL HIGH (ref 39–117)
BUN: 3 mg/dL — ABNORMAL LOW (ref 6–23)
CO2: 17 mEq/L — ABNORMAL LOW (ref 19–32)
Calcium: 8.8 mg/dL (ref 8.4–10.5)
Chloride: 100 mEq/L (ref 96–112)
Creatinine, Ser: 0.56 mg/dL (ref 0.50–1.10)
GFR calc Af Amer: >90 mL/min (ref >90)
GFR calc non Af Amer: >90 mL/min (ref >90)
Glucose, Bld: 107 mg/dL — ABNORMAL HIGH (ref 70–99)
Potassium: 3.1 mEq/L — ABNORMAL LOW (ref 3.5–5.1)
Sodium: 131 mEq/L — ABNORMAL LOW (ref 135–145)
Total Bilirubin: 0.9 mg/dL (ref 0.3–1.2)
Total Protein: 6.8 g/dL (ref 6.0–8.3)

## 2011-12-28 MED ORDER — VANCOMYCIN HCL IN DEXTROSE 1-5 GM/200ML-% IV SOLN
1000.0000 mg | Freq: Two times a day (BID) | INTRAVENOUS | Status: DC
  Filled 2011-12-28: qty 200

## 2011-12-28 MED ORDER — GENTAMICIN SULFATE 40 MG/ML IJ SOLN
Freq: Three times a day (TID) | INTRAVENOUS | Status: DC
  Administered 2011-12-28 – 2012-01-01 (×11): via INTRAVENOUS
  Filled 2011-12-28 (×12): qty 3.25

## 2011-12-28 MED ORDER — IBUPROFEN 600 MG PO TABS: 600.0000 mg | ORAL_TABLET | Freq: Four times a day (QID) | ORAL | Status: DC | PRN

## 2011-12-28 MED ORDER — CLINDAMYCIN PHOSPHATE 900 MG/50ML IV SOLN: 900.0000 mg | Freq: Three times a day (TID) | INTRAVENOUS | Status: DC

## 2011-12-28 MED ORDER — LACTATED RINGERS IV SOLN
INTRAVENOUS | Status: DC
  Administered 2011-12-28 – 2011-12-29 (×3): via INTRAVENOUS

## 2011-12-28 MED ORDER — ACETAMINOPHEN 325 MG PO TABS
650.0000 mg | ORAL_TABLET | ORAL | Status: DC | PRN
  Administered 2011-12-29: 650 mg via ORAL
  Filled 2011-12-28: qty 2

## 2011-12-28 MED ORDER — GENTAMICIN SULFATE 40 MG/ML IJ SOLN
80.0000 mg | Freq: Three times a day (TID) | INTRAVENOUS | Status: DC
  Filled 2011-12-28 (×2): qty 2

## 2011-12-28 MED ORDER — CITRIC ACID-SODIUM CITRATE 334-500 MG/5ML PO SOLN: 30.0000 mL | ORAL | Status: DC | PRN

## 2011-12-28 MED ORDER — LACTATED RINGERS IV BOLUS (SEPSIS)
500.0000 mL | Freq: Once | INTRAVENOUS | Status: AC
  Administered 2011-12-28: 1000 mL via INTRAVENOUS

## 2011-12-28 MED ORDER — CLINDAMYCIN PHOSPHATE 900 MG/50ML IV SOLN
900.0000 mg | Freq: Three times a day (TID) | INTRAVENOUS | Status: DC
Start: 2011-12-28 — End: 2011-12-28

## 2011-12-28 MED ORDER — ACETAMINOPHEN 500 MG PO TABS
1000.0000 mg | ORAL_TABLET | ORAL | Status: AC
  Administered 2011-12-28: 1000 mg via ORAL
  Filled 2011-12-28: qty 2

## 2011-12-28 MED ORDER — LACTATED RINGERS IV SOLN: 500.0000 mL | INTRAVENOUS | Status: DC | PRN

## 2011-12-28 NOTE — H&P (Signed)
Pt also seen, history reviewed. Pt notes no ROM, no ctx, some back pain. No VB. Cough and fevers for the past few days and has been on a z-pack. Pt notes no CP, no SOB, no leg pain. No dysuria or hematuria.   History signif for GBS bacteruria, treated at the start of the preg, Clind/ EES sensitive. Also multple chlamydial infections. No FH/ personal h/o VTE/ DVT. Pt w/ bowel and liver surgery as an infant ("my intestines didn't form right") and prior ovarian cyst rupture, PID and ectopic treated by MTX. Pt notes PCN anaphylaxis  On exam coughing wet cough. R sided CVAT present. No fundal tenderness.  Toco: irritible FH: initially in 190-200's while pt febrile, coming down to 170's, 5 beat variable, no accels, no decels  A/P: while chorio in DDx, pt w/o fundal tenderness. Pt clinically w/ pyelonephritis. Will treat with gent/ Clinda. Avoid PCN/ cephalosporins due to PCN anaphylaxis. Will control infection and consider IOL once improved. Consdier aztreonam if pt does not respond to gent/ clinda - FWB. BPP 8/8. NST non-reactive and tachycardia but no evidence placental insufficiency. NST responding to fevers. Need to control infection. If fetal distress develops will proceed w/ c/s. Pt aware.  Abu Heavin A. 12/28/2011 8:54 PM

## 2011-12-28 NOTE — MAU Provider Note (Signed)
  History     CSN: [id number]  Orders called to MAU - Nicki Reaper at 1725 Arrival date and time: 12/28/11 1728 Provider in to review strip at 1745   First Provider Initiated Contact with Patient 12/28/11 1747     No chief complaint on file.  HPI Sent from office for fetal tachycardia and (?)fetal decel  - needs NST and BPP CBC in office - WBC 15.9 / hgb 10.8 URI this past week - on Z-pak with patient reported intermittent fever x 4 days in range of 100-101.5. Decreased fetal movement today. + cramping and few ctx  No LOF / no bleeding / no vaginal discharge / no UTI symptoms + nausea with some vomiting / normal BM yesterday   Past Medical History  Diagnosis Date  . Thyroid disease   . Hypothyroidism   . Tachycardia   . Headache   . Preterm contractions 12/07/2011    Past Surgical History  Procedure Date  . Liver patched   . Intestines removed   . Knee surgery     Family History  Problem Relation Age of Onset  . Diabetes Father   . Hypertension Father   . Other Neg Hx     History  Substance Use Topics  . Smoking status: Never Smoker   . Smokeless tobacco: Never Used  . Alcohol Use: No    Allergies:  Allergies  Allergen Reactions  . Penicillins Anaphylaxis  . Morphine And Related Itching    Prescriptions prior to admission  Medication Sig Dispense Refill  . acetaminophen (TYLENOL) 500 MG tablet Take 500 mg by mouth once.      Marland Kitchen levothyroxine (SYNTHROID, LEVOTHROID) 175 MCG tablet Take 175 mcg by mouth daily.      . Prenatal Vit-Fe Fumarate-FA (PRENATAL MULTIVITAMIN) TABS Take 1 tablet by mouth daily.      Marland Kitchen zolpidem (AMBIEN) 10 MG tablet Take 0.5 tablets (5 mg total) by mouth at bedtime as needed for sleep.  15 tablet  0    ROS Physical Exam   Blood pressure 127/97, pulse 98, temperature 99 F (37.2 C), temperature source Oral, resp. rate 20, height 5' (1.524 m), weight 51.801 kg (114 lb 3.2 oz), SpO2 100.00%.  Physical Exam Alert and oriented /  calm / NAD Lungs - clear / unlabored / non-productive cough at intervals Heart - RRR Abdomen -active bowel sounds / soft and non-distended/ skin adhesions from neonatal surgery Uterus - non-tender / gravid  VE deferred at this time  Extremities - no edema / no calf pain or tenderness   MAU Course  Procedures  EFM : baseline 160 / decreased variability / no accels Toco - UI every 1-2 minutes with irregular ctx  SONO - BPP 8-8  Assessment and Plan  38 5/7 hypothyroidism  Non-reassuring fetal monitoring - tachycardia with decreased fetal movement URI - no evidence of bronchitis or pneumonia Febrile - cannot rule out chorioamnionitis Recurrent STD in pregnancy (chlamydia) / PID and TOA in past Hx short bowel syndrome / neonatal bowel surgery - suspect significant abdominal adhesions  1) NST and BPP 2) Consult with MD after sono resulted 3) plan admission  [name] 12/28/2011, 5:51 PM

## 2011-12-28 NOTE — Progress Notes (Signed)
ANTIBIOTIC CONSULT NOTE - INITIAL  Pharmacy Consult for Gentamicin Indication:Pyelonephritis  Allergies  Allergen Reactions  . Penicillins Anaphylaxis  . Morphine And Related Itching    Patient Measurements: Height: 5' (152.4 cm) Weight: 114 lb (51.71 kg) IBW/kg (Calculated) : 45.5  Adjusted Body Weight: 51.7kg  Vital Signs: Temp: 100.1 F (37.8 C) (06/28 2106) Temp src: Oral (06/28 2002) BP: 107/72 mmHg (06/28 2106) Pulse Rate: 135  (06/28 2106)    Medical History: Past Medical History  Diagnosis Date  . Thyroid disease   . Hypothyroidism   . Tachycardia   . Headache   . Preterm contractions 12/07/2011    Medications:  Clindamycin 900mg  IV q8h Assessment: 21yo F 38weeks GBS + admitted with fever and fetal tachycardia to r/o pyelonephritis. Medications limited due to anaphylaxis to Penicillin.  Goal of Therapy:  Gentamicin peaks 6-56mcg/ml and troughs < 50mcg/ml  Plan:  1. Gentamicin 130mg  IV q8h (mixed with Clindamycin). 2. Draw SCr in am. 3. Will continue to follow and assess need for broader coverage for pyelonephritis as discussed with Dr. Ernestina Penna based on pt's clinical status. Thanks!  Claybon Jabs 12/28/2011,9:39 PM

## 2011-12-28 NOTE — H&P (Signed)
OB ADMISSION/ HISTORY & PHYSICAL:  Admission Date: 12/28/2011  5:28 PM  Admit Diagnosis:  38 5/7 non-reassuring FHR with tachycardia / decreased fetal movement  / hypothyroidism   [name] is a 21 y.o. female sent from office for fetal tachycardia and questionable FHR deceleration. Reports decreased FM and intermittent fever (100-101) with URI x 5 days - currently day 3 of Z-pak.   Prenatal History: G3P0020   EDC : 01/06/2012, by Other Basis  Prenatal care at [facility]  Primary Ob provider - [name] CNM / Endocrinology - Dr Althemier  Prenatal course complicated by: uncontrolled hypothyroidism (control not established until ~[redacted] weeks gestation due to non-compliance with medication) / anemia / repeat chlamydial infection / poor dentition with multiple cavities (seeing dentist in pregnancy) / threatened preterm labor / hx short bowel syndrome - chronic constipation (neonatal bowel resection) / undetermined paternity / social and financial stressors / vitiligo.  Prenatal Labs: ABO, Rh:   O positive Antibody:  negative Rubella:   Immune RPR:   NR / NR / NR HBsAg:   Negative HIV:   NR / NR 1 hr Glucola : 87 GC: Neg / Neg / Neg CHL: Neg / Pos / Neg / Pos / Neg / Pos GBS:   + bacturia in first trimester (clindamycin sensitive)                 36 weeks negative - treat intrapartum per CDC guidelines for GBS bacturia    Medical / Surgical History :  Past medical history:  Past Medical History  Diagnosis Date  . Thyroid disease   . Hypothyroidism   . Tachycardia   . Headache   . Preterm contractions 12/07/2011     Past surgical history:  Past Surgical History  Procedure Date  . Liver patched   . Intestines removed   . Knee surgery      Family History:  Family History  Problem Relation Age of Onset  . Diabetes Father   . Hypertension Father   . Other Neg Hx      Social History:  reports that she has never smoked. She has never used  smokeless tobacco. She reports that she does not drink alcohol or use illicit drugs.   Allergies: Penicillins and Morphine and related  PCN - anaphylaxis / hives Hydrocodone - rash Morphine - itching and rash  Current Medications at time of admission:  No current facility-administered medications for this encounter.  Prenatal vitamin daily Levothyroxine 150 mcg daily Azithromycin 250 mg daily (day 3) Tylenol prn  Review of Systems: Decreased FM Some cramping and ctx No LOF No bleeding or vaginal discharge No UTI symptoms No abdominal pain + nausea and occasional vomiting ( + reflux symptoms increased) Non-productive cough No SOB or chest pain Fever intermittently  Physical Exam:   VS: temp 99 / pulse 98 / Resp 18 / BP 127/97   General: NAD/ calm / generalized malaise / (+)vetiligo Heart: RRR Lungs: Clear and unlabored Abdomen: gravid / non-tender / (+) skin adhesions from surgery as infant Extremities: no edema  FHR: 160 / decreased variability / no accels / no decels TOCO: UI with irregular ctx   Assessment: 38 5/7 hypothyroidism controlled in third trimester Non-reassuring FHR - tachycardia and decreased fetal movements Febrile - unknown etiology (URI vs UTI vs chorioamnionitis vs other) - 101 in MAU Infection risk : recurrent STD / poor dentition / hx GBS bacturia / URI Abdominal adhesions - likely  significant internal adhesions from  neonatal bowel resection and PID hx with recurrent STD  (GC and chlamydia)   Plan:  Consult with Dr Ernestina Penna Admit - induction of labor  (fetal tachycardia - suspect chorioamnionitis) Vancomycin IV Tylenol 1000mg  po now IV fluid bolus 500 ml    Chamar Broughton SNM 12/28/2011, 6:11 PM

## 2011-12-29 ENCOUNTER — Inpatient Hospital Stay (HOSPITAL_COMMUNITY): Payer: BC Managed Care – PPO

## 2011-12-29 DIAGNOSIS — O2300 Infections of kidney in pregnancy, unspecified trimester: Secondary | ICD-10-CM | POA: Diagnosis present

## 2011-12-29 LAB — RPR: RPR Ser Ql: NONREACTIVE

## 2011-12-29 LAB — CREATININE, SERUM: GFR calc non Af Amer: >90 mL/min (ref >90)

## 2011-12-29 LAB — TSH: TSH: 7.533 u[IU]/mL — ABNORMAL HIGH (ref 0.350–4.500)

## 2011-12-29 LAB — T3, FREE: T3, Free: 1.8 pg/mL — ABNORMAL LOW (ref 2.3–4.2)

## 2011-12-29 LAB — T4, FREE: Free T4: 1.09 ng/dL (ref 0.80–1.80)

## 2011-12-29 MED ORDER — PRENATAL MULTIVITAMIN CH
1.0000 | ORAL_TABLET | Freq: Every day | ORAL | Status: DC
  Administered 2011-12-31 – 2012-01-05 (×6): 1 via ORAL
  Filled 2011-12-29 (×5): qty 1

## 2011-12-29 MED ORDER — LACTATED RINGERS IV SOLN
INTRAVENOUS | Status: DC
  Administered 2011-12-29 – 2011-12-30 (×3): via INTRAVENOUS
  Administered 2011-12-31: 125 mL/h via INTRAVENOUS
  Administered 2011-12-31 – 2012-01-02 (×6): via INTRAVENOUS

## 2011-12-29 MED ORDER — LEVOTHYROXINE SODIUM 175 MCG PO TABS
175.0000 ug | ORAL_TABLET | Freq: Every morning | ORAL | Status: DC
  Administered 2011-12-29 – 2012-01-05 (×8): 175 ug via ORAL
  Filled 2011-12-29 (×10): qty 1

## 2011-12-29 MED ORDER — DOCUSATE SODIUM 100 MG PO CAPS
100.0000 mg | ORAL_CAPSULE | Freq: Two times a day (BID) | ORAL | Status: DC | PRN
  Administered 2011-12-30: 100 mg via ORAL
  Filled 2011-12-29: qty 1

## 2011-12-29 MED ORDER — ACETAMINOPHEN 325 MG PO TABS
650.0000 mg | ORAL_TABLET | ORAL | Status: DC | PRN
  Administered 2011-12-30 (×3): 650 mg via ORAL
  Filled 2011-12-29 (×3): qty 2

## 2011-12-29 MED ORDER — CALCIUM CARBONATE ANTACID 500 MG PO CHEW: 2.0000 | CHEWABLE_TABLET | ORAL | Status: DC | PRN

## 2011-12-29 MED ORDER — ZOLPIDEM TARTRATE 10 MG PO TABS: 10.0000 mg | ORAL_TABLET | Freq: Every evening | ORAL | Status: DC | PRN

## 2011-12-29 MED ORDER — ZOLPIDEM TARTRATE 5 MG PO TABS
5.0000 mg | ORAL_TABLET | Freq: Every evening | ORAL | Status: DC | PRN
  Administered 2011-12-29 – 2011-12-30 (×3): 5 mg via ORAL
  Filled 2011-12-29 (×3): qty 1

## 2011-12-29 MED ORDER — POLYSACCHARIDE IRON COMPLEX 150 MG PO CAPS
150.0000 mg | ORAL_CAPSULE | Freq: Every day | ORAL | Status: DC
  Administered 2011-12-29 – 2012-01-05 (×7): 150 mg via ORAL
  Filled 2011-12-29 (×9): qty 1

## 2011-12-29 MED ORDER — POLYETHYLENE GLYCOL 3350 17 G PO PACK
17.0000 g | PACK | Freq: Every day | ORAL | Status: DC | PRN
  Administered 2011-12-29: 17 g via ORAL
  Filled 2011-12-29: qty 1

## 2011-12-29 MED ORDER — ZOLPIDEM TARTRATE 5 MG PO TABS: 5.0000 mg | ORAL_TABLET | Freq: Every evening | ORAL | Status: DC | PRN

## 2011-12-29 MED ORDER — ACETAMINOPHEN 325 MG PO TABS: 650.0000 mg | ORAL_TABLET | ORAL | Status: DC | PRN

## 2011-12-29 MED ORDER — SALINE SPRAY 0.65 % NA SOLN
1.0000 | NASAL | Status: DC | PRN
  Administered 2011-12-29: 1 via NASAL
  Filled 2011-12-29: qty 44

## 2011-12-29 MED ORDER — DOCUSATE SODIUM 100 MG PO CAPS
100.0000 mg | ORAL_CAPSULE | Freq: Two times a day (BID) | ORAL | Status: DC
  Administered 2011-12-29 (×2): 100 mg via ORAL
  Filled 2011-12-29 (×2): qty 1

## 2011-12-29 NOTE — Progress Notes (Addendum)
Hospital Day # 1 - Maternal fever with pyelonephritis (pending urine cx)  S:  Reports feeling agitated - FOB arrived this am / arguing with family (mother) this am             General malaise and fatigue             States urinary incontinence during the night and early this am / no dysuria             Congested with non-productive cough             Tolerating po/ No nausea or vomiting this am              No abdominal pain - some backache              O:  A & O x 3 / sitting up in bed / agitated and talking to visitor             VS: Blood pressure 95/57, pulse 95, temperature 98.6 F (37 C), temperature source Oral, resp. rate 20, height 5' (1.524 m), weight 51.71 kg (114 lb), SpO2 97.00%, unknown if currently breastfeeding.             Last temp 100.2 with increased FHR baseline to 160 this am   LABS: WBC/Hgb/Hct/Plts:  14.5/9.9/29.4/251 (06/28 2130)   I&O: + 1200 ml  Lungs: Clear without wheezing or adventitious sounds / unlabored / no SOB  Heart: Mild tachycardia at 118 apical / regular rhythm / no mumurs  Abdomen: soft, non-tender, non-distended              Uterus: non-tender  Extremities: no edema, no calf pain or tenderness    A:  38 6/7 weeks - febrile with presumed pyelonephritis       URI for past week - non-productive cough / no evidence of bronchitis or pneumonia at this time      Intermittent fetal tachycardia related to maternal fever      Hypothyroidism (hx non-compliance with medication)      Iron deficiency anemia (short bowel syndrome)     P:  continue antibiotic course       check thyroid labs       OB sono for growth  / renal sono       Tylenol prn for fever over 100.4 or if fetal tachycardia and over 100.0       Niferex 150mg  daily with colace BID / Miralax prn daily   [name] CNM, MSN 12/29/2011, 10:03 AM

## 2011-12-29 NOTE — Progress Notes (Signed)
Trans to Rm 151.  Ambulated without s/s of acute distress.  Accompanied by RN & personal belongings

## 2011-12-29 NOTE — Progress Notes (Signed)
  Transferred patient to Antenatal - recheck on patient status  TSH resulted - 7.53 ( hx non-compliance - likely has stopped meds again) Free T3 - 1.8  Free T4 - 1.09  All cultures still pending.  FHR baseline 150s mostly - some episodes of baseline in 135-145 range / moderate variability. Three episodes of FHR decel to 90s in past 24 hours      Today around 1900 with loss of continuous tracing - possible artifact, FHR decel, or maternal pulse.       Temperature improving - tmax 100.2 today / mostly 97-99 range  UI has resolved - only rare ctx  persistent cough - using saline spray for congestion                       (not candidate for cough medication with uncontrolled hypothyroidism)  [name] CNM MSN

## 2011-12-29 NOTE — Progress Notes (Addendum)
1920- [name] CNM notified of prolonged fhr decel, increased temp of 99.9, and fhr baseline of 150bpm from 135-140 bpm. States to recheck temp in 1 hour.  PROVIDER ADDENDUM:  EFM reviewed to assess decel - from 1903 to 1916 time range intermittent graphing of FHR - 150 range with questionable loss of contact versus FHR prolonged deceleration. Maternal position noted as being changed - no maternal HR noted for comparison. FHR after 1920 - baseline 150 range with moderate variability / no reflex tachycardia or loss of variability. No evidence of a significant prolonged fetal decel (? decel for over 10 minutes) - likely loss of contact with artifact, intermittent FHR, and/or maternal pulse graphing. [name] CNM

## 2011-12-29 NOTE — Progress Notes (Signed)
Patient ID: [name], female   DOB: 1990/09/28, 21 y.o.   MRN: [id number]  S:   Feeling "ok" Nursing staff request to move patient off L&D  O:    VS: Blood pressure 95/57, pulse 99, temperature 98.9 F (37.2 C), temperature source Oral, resp. rate 20, height 5' (1.524 m), weight 51.71 kg (114 lb), SpO2 100.00%, unknown if currently breastfeeding.         FHR : baseline 140 / variability moderate / accels none / decels none        Toco: UI         Labs: pending - TSH / Free T3 / Free T4 / blood cultures / urine culture         Renal sono : right -moderate hydronephrosis / left - mild hydronephoresis / normal renal jets / moderate bladder distention        OB sono: cephalic / AFI 18.14 / growth at 40% with EFW 6-12  (AC 17%)  A/P: Stable status         Will move to antepartum to continue inpatient IV antibiotics and continuous EFM         Will re-evaluate status with culture results         Continue strict I &O    [name] CNM/ MSN 12/29/2011, 3:56 PM

## 2011-12-29 NOTE — MAU Provider Note (Signed)
See admit note from same day

## 2011-12-30 ENCOUNTER — Inpatient Hospital Stay (HOSPITAL_COMMUNITY): Payer: BC Managed Care – PPO

## 2011-12-30 MED ORDER — LACTATED RINGERS IV BOLUS (SEPSIS)
300.0000 mL | Freq: Once | INTRAVENOUS | Status: AC
  Administered 2011-12-30: 300 mL via INTRAVENOUS

## 2011-12-30 MED ORDER — CITRIC ACID-SODIUM CITRATE 334-500 MG/5ML PO SOLN
ORAL | Status: AC
  Filled 2011-12-30: qty 15

## 2011-12-30 NOTE — Progress Notes (Signed)
Off monitor to toilet before trans to U/S.

## 2011-12-30 NOTE — Progress Notes (Signed)
Back from Ultrasound

## 2011-12-30 NOTE — Progress Notes (Signed)
To Ultrasound via wheelchair.  Accompanied by RN & pt's mother.

## 2011-12-30 NOTE — Progress Notes (Addendum)
Patient ID: [name], female   DOB: [birth year], 21 y.o.   MRN: [id number]  Here for evaluation - nurse call to say patient in active labor / SROM with (+) nitrazine / bloody show and dilated 5 cm Verbal order given for transfer to [facility] - provider enroute to evaluate  S: Feeling ok - cramping and ctx       urinary incontinence intermittently - woke this AM and bed was wet      + FM      Some bloody mucus with BRP      BM last pm - loose stools x 2      O:  VS: Blood pressure 102/59, pulse 81, temperature 97.4 F (36.3 C), temperature source Axillary, resp. rate 16, height 5' (1.524 m), weight 51.71 kg (114 lb), SpO2 100.00%, unknown if currently breastfeeding.         Tmax in past 24 hours : 100.4 (0100 am)        Lungs - clear        Heart - RRR        CVA (+) right but less reactive with exam        Abdomen - soft and non-tender / uterus non-tender        FHR : baseline 125 / variability moderate / accels none since admission / decels occasional variable x 10-30 to nadir 90                   several episodes of prolonged FHR deceleration - last one early this am ~ 0130 for 10 minutes with documented                   deceleration and return to baseline                   No rebound tachycardia but decreased variability after deceleration          FHR baseline 120-130 range when temp is 97-98 but FHR rises to 150 range when temp > 99         Toco: contractions every 8-10 minutes / moderate        Perineum : no fluid leakage / no bloody show        Cervix : anterior/ 2-3 cm / 80% / vtx / -1  (no cervical change)        Membranes: (-) ferning on exam / BBOW  A: No evidence of onset labor     Intermittent prolonged FHR decels / non-reactive since admission / unstable FHR baseline related to maternal temperature /concern for fetal tolerance of labor     Hypothyroidism - non-compliant with therapy / elevated TSH again (7)     Pyelonephritis - urine culture final result and  sensitivity pending / initial report show 25,000 gram negative rods      Blood cultures pending     URI with persistent cough - no worsening of symptoms     Chorio risk: pyelo / (+) GBS / 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     HX abdominal surgery and pelvic infections - concern of significant adhesions in maternal abdomen     Iron deficiency anemia (hgb9) - risk for severe anemia requiring transfusion / poor tissue perfusion            P: Consult with Dr Ernestina Penna - updated with events throughout night and current status       Recommendation/ Plan of  care -         repeat BPP today to assess fetal well-being /CST prior to attempting labor induction / BPP at bedside this am       delivery IF - onset of spontaneous labor / urine or blood cultures indicate need for different treatment course for pyelo ( ie current course not effective with pregnancy)                           Abnormal  BPP                           repetitive FHR decels       consider IOL after maintained afebrile status  and cultures confirm effective antibiotic course -                                    concern for delivery with active infection without clear indication (ie fetal indication)       attempt vaginal birth if fetal status stable                                           significant concern for abdominal and pelvic adhesions from surgery and infections (could consider general surgeon consult if CS indicated)                                          NO FSE due to infection risk / eye prophylaxis to newborn without delay / consult NICU - neonatalogist with impending delivery for newborn plan                                                          [name] CNM, MSN 12/30/2011, 9:58 AM  Pt seen. Pt notes urinary frequency improving but still some burning w/ urination. Mild back cramps but much improved. Pt notes no fevers all day today and feeling better. Pt states "cough the same as it has  always been" (indicating she has had the cough all pregnancy). Pt notes no LOF, no VB and good fetal movement.  Filed Vitals:   12/30/11 1625 12/30/11 1630 12/30/11 1635 12/30/11 1640  BP:      Pulse: 121 122 120 111  Temp: 98.5 F (36.9 C)     TempSrc: Oral     Resp: 20     Height:      Weight:      SpO2: 100% 99% 99% 99%   Gen: well appearing, no distress, coughing at time Pulm: CTAB, no crackles, no wheezes CV: mild tachycardia, no murmur Abd: gravid, NT Back minimal R sided CVAT, much improved from prior GU: def LE: NT, no edema Toco: no ctx FH: currently baseline in 150's, w/ 10 beat of variability and no accels. Previously baselines had been in 120s, rarely with accels and always with good variability. 40 min ago, with a temp to pt, baseline increased to 170's and with several periods of  decels to 70's. These were not with contractions and still w/ good variability in these periods of low FH. One place on strip where FH alternated from 170 to 60, each for about 30 sec to 1 min, total episode about 6 min. Per nursing, bedside u/s done at that time to confirm this was fetal tracing.  BPP 8/8  A/P: Overall agree w/ note by Fredric Mare above. - pyelonephritis, Clinically improving with decreased urinary frequency and improved costovertebral angle tenderness. Patient continues to spike fevers. Will rule out other sources of infection. Urine cultures with gram negatives rods, identification and sensitivity pending. Blood culture negative to date. Will check chest x-ray given patient's cough. Clinically no evidence of ARDS, though this will be assessed by x-ray. Will confirm antibiotic sensitivity when these come back. Patient likely to need more time for antibiotics to penetrate kidney.  - Fetal well being. Overall fetal testing is reassuring. BPP of 8 out of 8 with normal amniotic fluid. An NST is showing good variability. No evidence for placental insufficiency at this time. Episodes of  fetal bradycardia of unclear etiology. These are short lived, and as they are not associated with uterine contractions but instead are associated with periods of maternal tachycardia and maternal fever these are unlikely to be the result of placental insufficiency. For this reason will continue close fetal monitoring, but no indication to move to delivery. Will attempt to keep mom afebrile.  - Mode of delivery. Given patient with significant surgical history will plan for vaginal delivery. awaiting improvement in maternal infection will only improve the chances of a vaginal delivery. Given periods of fetal bradycardia, though it does not appear at this time to be related to placental insufficiency, would consider a contraction stress test prior to induction.  Smith Potenza A. 12/30/2011 5:31 PM

## 2011-12-30 NOTE — Progress Notes (Signed)
[name], CNM notified of SVE (5cm), increase from yesterday, possible SROM and prolong decel. TO received to transfer patient to L&D, CNM is on her way in to evaluate patient.

## 2011-12-30 NOTE — Progress Notes (Signed)
Arrived to Rm 162 from An[facility]atal.  Assumed pt care.

## 2011-12-30 NOTE — Progress Notes (Signed)
Taken to X-ray via wheelchair.  Accompanied by RN.

## 2011-12-30 NOTE — Progress Notes (Signed)
Used bedside Ultrasound to help locate & verify FHR on monitor.  Belenda Cruise, RNC, charge at bedside to assist).  On ultrasound, FHR is visibly fluctuating between 80-90 & 180.  Will notify provider.

## 2011-12-30 NOTE — Progress Notes (Signed)
On EFM, FHR appears to be alternating between 80s & 170s.  At bedside, FHR sounds unusual on monitor.  Checked against maternal radial pulse, and does not appear to be tracing MHR at this time.

## 2011-12-30 NOTE — Progress Notes (Signed)
Back from XRay

## 2011-12-31 ENCOUNTER — Inpatient Hospital Stay (HOSPITAL_COMMUNITY): Payer: BC Managed Care – PPO

## 2011-12-31 LAB — URINE CULTURE: Colony Count: 25000

## 2011-12-31 LAB — COMPREHENSIVE METABOLIC PANEL
AST: 7 U/L (ref 0–37)
Alkaline Phosphatase: 232 U/L — ABNORMAL HIGH (ref 39–117)
BUN: 6 mg/dL (ref 6–23)
CO2: 21 mEq/L (ref 19–32)
Chloride: 103 mEq/L (ref 96–112)
Creatinine, Ser: 0.76 mg/dL (ref 0.50–1.10)
GFR calc non Af Amer: >90 mL/min (ref >90)
Potassium: 3.6 mEq/L (ref 3.5–5.1)
Total Bilirubin: 0.4 mg/dL (ref 0.3–1.2)

## 2011-12-31 LAB — CBC WITH DIFFERENTIAL/PLATELET
Eosinophils Relative: 0 % (ref 0–5)
HCT: 25.4 % — ABNORMAL LOW (ref 36.0–46.0)
Lymphs Abs: 2.1 10*3/uL (ref 0.7–4.0)
MCV: 87 fL (ref 78.0–100.0)
Monocytes Relative: 6 % (ref 3–12)
Neutro Abs: 12 10*3/uL — ABNORMAL HIGH (ref 1.7–7.7)
RBC: 2.92 MIL/uL — ABNORMAL LOW (ref 3.87–5.11)
WBC: 15 10*3/uL — ABNORMAL HIGH (ref 4.0–10.5)

## 2011-12-31 LAB — PROTIME-INR
INR: 1.15 (ref 0.00–1.49)
Prothrombin Time: 14.9 seconds (ref 11.6–15.2)

## 2011-12-31 LAB — TYPE AND SCREEN
ABO/RH(D): O POS
Antibody Screen: NEGATIVE

## 2011-12-31 LAB — FIBRINOGEN: Fibrinogen: 758 mg/dL — ABNORMAL HIGH (ref 204–475)

## 2011-12-31 LAB — D-DIMER, QUANTITATIVE (NOT AT ARMC): D-Dimer, Quant: 2.09 ug/mL-FEU — ABNORMAL HIGH (ref 0.00–0.48)

## 2011-12-31 LAB — APTT: aPTT: 31 seconds (ref 24–37)

## 2011-12-31 LAB — PREPARE RBC (CROSSMATCH)

## 2011-12-31 MED ORDER — ACETAMINOPHEN 325 MG PO TABS
650.0000 mg | ORAL_TABLET | Freq: Four times a day (QID) | ORAL | Status: DC
  Administered 2011-12-31 – 2012-01-02 (×10): 650 mg via ORAL
  Filled 2011-12-31 (×10): qty 2

## 2011-12-31 MED ORDER — MENTHOL 3 MG MT LOZG
1.0000 | LOZENGE | OROMUCOSAL | Status: DC | PRN
  Filled 2011-12-31: qty 9

## 2011-12-31 NOTE — Progress Notes (Signed)
Addendum note:  Chest x-ray this pm - normal / O2 sat 97-100 RA / non-productive cough NO evidence of bronchitis / pneumonia / ARDS  FHR 145 baseline - increased baseline intermittently with maternal fever Discussion with nursing staff during witnessed FHR decel at bedside - notes irregular sound with decel and unstable baseline versus audible deceleration with artifact. FHR change only present with maternal fever and fetal tachycardia.  Possible fetal tachyarrhythmia - triggered by fetal tachycardia caused by maternal fever No identified fetal cardiac defect per CUS sono - no audible FHR irregularities  Non-reactive NST to date - Active FM / normal BPP   no evidence of fetal decomposition with prolonged FHR changhes which is more suggestive of tachyrhythmia than physiologic causes of prolonged fetal decels    Plan: Continue current management Tylenol changed to every 6 hour dosing to reduce maternal temperature / control fever curve Assess urine sensitivities when available Supportive care for cough - not candidate for cough medication with uncontrolled hypothyroidism / ok cepacol MFM consult tomorrow for delivery recommendation  [name] CNM, MSN

## 2011-12-31 NOTE — Consult Note (Signed)
Date of Admission:  12/28/2011  Date of Consult:  12/31/2011  Reason for Consult: Fever, Leukocytosis, Pyelonephritis Referring Physician: Claudean Severance  Impression/Recommendation: UTI Pregnant- 39 and 4/7 Would- Agree with stopping clinda Recheck her WBC, CMP Await BCx Consider renal u/s if further fevers.  Consider LE dopplers for DVT Comment- she has cough, fever and is at risk for DVT. She is not hypoxic (97% on RA). If this changes, would consider d-dimer (which can be false+ from pregnancy), spiral CT to eval for PE.  Could consider changing her to cephalosporin (some risk of cross reactivity), carbapenem (low risk of cross reactivity) or azactam (very low risk of cross reactivity). She appears to be doing well on the current anbx for now.  Clinically she appears well, awaiting delivery.    Thank you so much for this interesting consult,   Johny Sax [phone]  [name] is an 21 y.o. female.  HPI: 21 yo F G3P0020 with admission on 6-28 at 39 1/7 weeks, with fever, and leukocytosis. She had sx of URI prior to admission and had completed a z-pack prior to admission.  On admission she was started on clinda/gent (she has a hx of anaphylaxis to PEN). Her WBC was 14.5 and her temp was 101. Tmax last 24h 100.2. WBC not repeated. BCX ngtd. UCx 25k E coli (R-amp, bactrim; I- zosyn).   Past Medical History  Diagnosis Date  . Thyroid disease   . Hypothyroidism   . Tachycardia   . Headache   . Preterm contractions 12/07/2011    Past Surgical History  Procedure Date  . Liver patched   . Intestines removed   . Knee surgery   ergies:   Allergies  Allergen Reactions  . Penicillins Anaphylaxis  . Morphine And Related Itching    Medications:  Scheduled:   . acetaminophen  650 mg Oral Q6H  . gentamicin (GARAMYCIN) with clindamycin (CLEOCIN) IV   Intravenous Q8H  . iron polysaccharides  150 mg Oral Daily  . lactated ringers  300 mL Intravenous Once  . levothyroxine   175 mcg Oral q morning - 10a  . prenatal multivitamin  1 tablet Oral Daily    Social History:  reports that she has never smoked. She has never used smokeless tobacco. She reports that she does not drink alcohol or use illicit drugs.  Family History  Problem Relation Age of Onset  . Diabetes Father   . Hypertension Father   . Other Neg Hx     General ROS: no dysuria, dry cough, constipation, no diarrhea, previously had chest pain under L breast, no LE swelling, no calf pain. She recalls being treated with PEN when 21 yo and her throat closing. See HPI for further info.   Blood pressure 117/77, pulse 123, temperature 99 F (37.2 C), temperature source Oral, resp. rate 20, height 5' (1.524 m), weight 51.71 kg (114 lb), SpO2 97.00%, unknown if currently breastfeeding. General appearance: alert, cooperative and no distress Eyes: negative findings: pupils equal, round, reactive to light and accomodation and no photophobia Throat: abnormal findings: badly rotten tooth, L upper 1st molar (?). non-tender, not loose.  Neck: no adenopathy and non-tender.  Lungs: clear to auscultation bilaterally Heart: tachycardia Abdomen: normal findings: bowel sounds normal and soft, non-tender and appropriately gravid Extremities: edema none and no calf tenderness or cordis Skin: significant vitiligo   No results found for this or any previous visit (from the past 48 hour(s)).    Component Value Date/Time   SDES  URINE, CLEAN CATCH 12/28/2011 2330   SPECREQUEST azithromycin 12/28/2011 2330   CULT ESCHERICHIA COLI 12/28/2011 2330   REPTSTATUS 12/31/2011 FINAL 12/28/2011 2330   Dg Chest 2 View  12/30/2011  *RADIOLOGY REPORT*  Clinical Data: Cough and fever  CHEST - 2 VIEW  Comparison: None  Findings: The heart size and mediastinal contours are within normal limits.  Both lungs are clear.  The visualized skeletal structures are unremarkable.  IMPRESSION: Negative exam.  Original Report Authenticated By: Rosealee Albee, M.D.   US Fetal Bpp W/o Non Stress  12/31/2011  OBSTETRICAL ULTRASOUND: This exam was performed within a [facility]. The OB US report was generated in the [facility] system, and faxed to the ordering physician.   This report is also available in [employer] and in the [employer]. See [facility] Obstetric US report.   Recent Results (from the past 240 hour(s))  CULTURE, BLOOD (ROUTINE X 2)     Status: Normal (Preliminary result)   Collection Time   12/28/11  8:55 PM      Component Value Range Status Comment   Specimen Description BLOOD RIGHT ARM   Final    Special Requests BOTTLES DRAWN AEROBIC AND ANAEROBIC   Final    Culture  Setup Time 12/29/2011 01:30   Final    Culture     Final    Value:        BLOOD CULTURE RECEIVED NO GROWTH TO DATE CULTURE WILL BE HELD FOR 5 DAYS BEFORE ISSUING A FINAL NEGATIVE REPORT   Report Status PENDING   Incomplete   CULTURE, BLOOD (ROUTINE X 2)     Status: Normal (Preliminary result)   Collection Time   12/28/11  9:15 PM      Component Value Range Status Comment   Specimen Description BLOOD RIGHT ARM   Final    Special Requests BOTTLES DRAWN AEROBIC AND ANAEROBIC   Final    Culture  Setup Time 12/29/2011 01:30   Final    Culture     Final    Value:        BLOOD CULTURE RECEIVED NO GROWTH TO DATE CULTURE WILL BE HELD FOR 5 DAYS BEFORE ISSUING A FINAL NEGATIVE REPORT   Report Status PENDING   Incomplete   URINE CULTURE     Status: Normal   Collection Time   12/28/11 11:30 PM      Component Value Range Status Comment   Specimen Description URINE, CLEAN CATCH   Final    Special Requests azithromycin   Final    Culture  Setup Time 12/29/2011 03:55   Final    Colony Count 25,000 COLONIES/ML   Final    Culture ESCHERICHIA COLI   Final    Report Status 12/31/2011 FINAL   Final    Organism ID, Bacteria ESCHERICHIA COLI   Final       12/31/2011, 3:06 PM     LOS: 3 days

## 2011-12-31 NOTE — Progress Notes (Signed)
MFM note  [name] is a 21 yo G3P0020 currently at 28 1/7 weeks who was admitted on 6/28 due to suspected pyelonephritis.  At the time of admission, she also complained of some URI symptoms (sore throat, non-productive cough) had just recently completed a course of Azithromycin.  Tmax on the night of admission was 101.2.  She was started on Gent / Clindamycin - now 72 hrs into therapy.  Urine cultures grew out E. Coli sensitive to Gentamycin.  Her Tmax over the last 24 hrs was at 2300 hrs last night (100.2).  Her overall fever curve has improved.  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  The patient currently denies abdominal pain, urinary symptoms, vaginal bleeding, leakage of fluid or contractions.  Over the hospital course, some fetal decelerations have been noted with an otherwise Category I tracing and BPPs of 8/8.  She reports normal fetal movement today.  Past Medical History  Diagnosis Date  . Thyroid disease   . Hypothyroidism   . Tachycardia   . Headache   . Preterm contractions 12/07/2011   Past Surgical History  Procedure Date  . Liver patched   . Intestines removed   . Knee surgery    POBhx - early SAB, ectopic pregnancy  Scheduled Meds:   . acetaminophen  650 mg Oral Q6H  . gentamicin (GARAMYCIN) with clindamycin (CLEOCIN) IV   Intravenous Q8H  . iron polysaccharides  150 mg Oral Daily  . lactated ringers  300 mL Intravenous Once  . levothyroxine  175 mcg Oral q morning - 10a  . prenatal multivitamin  1 tablet Oral Daily   Continuous Infusions:   . lactated ringers 125 mL/hr at 12/31/11 1200   PRN Meds:.calcium carbonate, menthol-cetylpyridinium, sodium chloride, zolpidem, DISCONTD: acetaminophen, DISCONTD: docusate sodium, DISCONTD: polyethylene glycol  Allergies  Allergen Reactions    . Penicillins Anaphylaxis  . Morphine And Related Itching    Social - denies tobacco, ETOH use of Illicit drug use  Family hx - neg for birth defects/ hereditary disorders  BP 117/77  Pulse 81  Temp 99.4 F (37.4 C) (Axillary)  Resp 20  Ht 5' (1.524 m)  Wt 114 lb (51.71 kg)  BMI 22.26 kg/m2  SpO2 80%  Breastfeeding? Unknown  WDWN gravid female in NAD Lungs: CTA Cor: RRR without murmurs Back: neg CVAT Abdomen: soft, non-tender  CBC    Component Value Date/Time   WBC 14.5* 12/28/2011 2130   RBC 3.36* 12/28/2011 2130   HGB 9.9* 12/28/2011 2130   HCT 29.4* 12/28/2011 2130   PLT 251 12/28/2011 2130   MCV 87.5 12/28/2011 2130   MCH 29.5 12/28/2011 2130   MCHC 33.7 12/28/2011 2130   RDW 14.9 12/28/2011 2130   TFT - TSH 7.5, FT4 1.09, FT3 1.8 chest X-ray - neg for infiltratates  GBS - pos (Clindamycin sensitive)  Assessement : 1) Fever of unknown origin - Patient has now completed > 72 hr course of IV antibiotics.  Urine culture showed E. Coli sensitive to Gentamycin.  Based on clinical exam, have low index of suspicion for chorioamnionitis. CXR negative for infiltrates.  Her fever curve has overall improved.  I am suspicious of a viral etiology. 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  Her fever curve has overall improved and feel that chorioamnionitis is unlikely.  Ideally, would try to wait until patient has been afebrile for >24 hrs prior to induction of labor, but this may not be possible or reasonable.    Based on the patient's poor compliance and hypothyroidism alone, feel that it would be reasonable to induce labor- now at 39+ weeks.  Would make  every attempt for vaginal delivery given hx of multiple bowel surgeries- and if C-section were required would try to get assistance in anticipation of bowel adhesions.   Thank you for the opportunity to be a part of the care of [name]  Please contact our office if we can be of further assistance.   I spent approximately 30 minutes with this patient with over 50% of time spent in face-to-face counseling.  Alpha Gula, MD

## 2011-12-31 NOTE — Progress Notes (Signed)
Notes from MFM and ID reviewed. Hgb 8.5 today / WBC 15 (no change since admission) / PLT stable  Consult with Dr Ernestina Penna - plan to move toward delivery when afebrile for 24 hours (febrile this pm) / recommend transfusion prior to induction / have blood available for delivery.  Discussed transfusion with patient - states does not want blood / hx severe anemia in past with recommendation for blood that she declined / states hematologist told her that she uses cells too fast and transfusion or iron would not help correct the anemia - may be related to previous transfusions as infant. Not seen by hematology in more than 6 years/ does not remember who she saw that that time. Reviewed benefit to blood prior to induction to improve O2 carry ability short-term / need to consider transfusion for fetal well-being. In addition, may need transfusion for blood loss with complicated delivery - especially if CS. Blood count low now and could become critically low with surgery or heavy blood loss with birth. Will consider transfusions - will discuss further tomorrow as we prepare for delivery date.  Plan for next 24 hours: prepare for delivery w/ tentative delivery date of July 3  Continue current ABX course (gent and clinda) / needs clinda for GBS coverage intrapartum  Tylenol scheduled every 6 hours Continuous EFM  Discuss blood transfusion with patient (done see note above) Type and cross (4 units - Holiday weekend) / consider pre-induction transfusion of 1-2 units Baseline DIC panel / low tolerance for spiral CT postpartum  Anesthesia consult tomorrow (complex patient management - epidural instead of spinal if surgery) General surgeon consult tomorrow ( to have available if needed)  Repeat BPP in am CST prior to induction  Arrange 1:1 nurse and provider care for induction day  Considering IOL Wednesday if afebrile next 24 hours (tmax 100.3 this afternoon) Update on-coming attending physicians in next  48 hours of plan  [name] CNM, MSN

## 2011-12-31 NOTE — Progress Notes (Signed)
Pt with continuous pulse ox on and intermittent episode of desaturation possibly related to coughing.  Strip with loss of signal and prolonged decel with moderated variability with tachycardia for 15 min.    Low grade maternal temp noted.   Pt also with watery stools x 2.  PO fluids offered.

## 2011-12-31 NOTE — Progress Notes (Addendum)
Patient ID: [name], female   DOB: Apr 28, 1991, 21 y.o.   MRN: [id number]  S: Feeling frustrated about being inpatient / feeling better     Aware of ctx - no painful / no backache     No nausea or vomiting / + loose stools x 2 in past 24 hours     (+) cough non-productive - "same"     Active FM / no LOF     Voiding QS - no incontinence in past 24 hours    O:  VS: Blood pressure 117/77, pulse 81, temperature 99.4 F (37.4 C), temperature source Axillary, resp. rate 20, height 5' (1.524 m), weight 51.71 kg (114 lb), SpO2 80.00%, unknown if currently breastfeeding.        FHR : baseline fluctuates based on maternal temperature between 125-160 / variability moderate / accels - none ( never reactive EFM tracing even prior to admission) / decels variable decels and mixed prolonged decel vs fetal arrythmia         Toco: contractions mild and irregular minutes         Cervix : not examined today        Membranes: intact  Chest x-ray - negative Renal sono - mild pyelectasis on left /. Moderate pyelectasis on right Blood cultures - negative Urine culture - (+) Ecoli 25,000colonies - sensitive to gentamycin / resistant to ampicillin and Bactrim  General - sitting in chair at bedside / NAD / calm  Heart RRR Lungs clear / + non-productive cough Abdomen soft and non-tender CVA -resolving with only minimal tenderness on right Extremities - no edema / no calf pain/  wearing SCD while in bed   A: Pyelo - Right / Ecoli      Intermittent febrile episodes despite IV abx since Friday PM  / Zpak x 4 days prior to admit (URI)     FHR category 2 (see other notes)             non-reactive / periodic tachycardia / variable decels / non-specific prolonged FHR decels       Uncontrolled hypothyroidism - noncompliance issues   P: Consult with Dr Cherly Hensen -                        recommend inpatient consult with ID on abx course and other recommendations  MFM consult pending today ID consult  pending today   Discussed with patient the difficulty and complexity of her situation with compounding risk factors. Need recommendations from MFM and ID to best management course at this time. Acknowledge her frustration of prolonged hospitalization without clear course of action - needed test  Results and time for antibiotics to be effective prior to making plan. Understands but remains frustrated and "ready to have the baby and go home"     [name] CNM, MSN 12/31/2011, 12:22 PM  Chart and hx reviewed. Pt seen. Agree with above plans

## 2012-01-01 ENCOUNTER — Inpatient Hospital Stay (HOSPITAL_COMMUNITY): Payer: BC Managed Care – PPO

## 2012-01-01 DIAGNOSIS — Z9889 Other specified postprocedural states: Secondary | ICD-10-CM

## 2012-01-01 MED ORDER — ACETAMINOPHEN 500 MG PO TABS: 500.0000 mg | ORAL_TABLET | Freq: Once | ORAL | Status: DC

## 2012-01-01 MED ORDER — DIPHENHYDRAMINE HCL 50 MG/ML IJ SOLN
25.0000 mg | Freq: Once | INTRAMUSCULAR | Status: AC
  Administered 2012-01-01: 25 mg via INTRAVENOUS
  Filled 2012-01-01: qty 1

## 2012-01-01 MED ORDER — SODIUM CHLORIDE 0.9 % IV SOLN
INTRAVENOUS | Status: DC
  Administered 2012-01-01 – 2012-01-02 (×3): via INTRAVENOUS

## 2012-01-01 MED ORDER — GENTAMICIN SULFATE 40 MG/ML IJ SOLN
130.0000 mg | INTRAVENOUS | Status: DC
  Administered 2012-01-02: 130 mg via INTRAVENOUS
  Filled 2012-01-01 (×5): qty 3.25

## 2012-01-01 MED ORDER — CLINDAMYCIN PHOSPHATE 900 MG/50ML IV SOLN
900.0000 mg | Freq: Three times a day (TID) | INTRAVENOUS | Status: DC
  Administered 2012-01-01 – 2012-01-02 (×3): 900 mg via INTRAVENOUS
  Filled 2012-01-01 (×9): qty 50

## 2012-01-01 MED ORDER — GENTAMICIN SULFATE 40 MG/ML IJ SOLN
130.0000 mg | Freq: Three times a day (TID) | INTRAVENOUS | Status: DC
  Administered 2012-01-01: 130 mg via INTRAVENOUS
  Filled 2012-01-01 (×3): qty 3.25

## 2012-01-01 NOTE — Progress Notes (Addendum)
S: Feeling better - no sleep      Active FM / no backache      Wants shower and ambulation today      O:    VS: Blood pressure 109/67, pulse 99, temperature 98.5 F (36.9 C), temperature source Oral, resp. rate 18, height 5' (1.524 m), weight 51.71 kg (114 lb), SpO2 94.00%, unknown if currently breastfeeding.  Temperature max during the night 99.2         FHR : baseline intermittent episodes of different baselines 125-155 based on maternal temperature                   variability moderate / accels - none since admissions / decels - occasional variable decels / rare prolonged        Toco: contractions mild and irregular / UI        Cervix : deferred this am        Membranes: intact  Alert and oriented / frustrated and in poor mood again this am Heart RR - apical 118 Lungs - clear Abdomen - soft / non-tender Extremities - no edema / no calf tenderness / wearing SCD intermittently / out of bed ad lib  Type and screen completed  - no antibody screen 4 units (PRBC)- ready for infusion  Anesthesia and general surgeon consult today  MFM and BPP this am  A: 39+ weeks pregnant      Pyelo - Ecoli (r) / improving clinically      URI - no evidence of bronchitis or pneumonia       Maternal fever - stable less than 100 for past 16 hours ( tylenol every 6 hours)      Uncontrolled hypothyroidism (noncompliance issues)      Non-reactive EFM with intermittent tachycardia and fetal decels / questionable tachyarrhythemia intermittently      Anemia (8.5)      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) / pelvic infections (recurrent STD)      Favorable Bishops for induction plan / risk for CS  P:   TODAY - MFM revisit with BPP  ( if BPP normal - plan for induction tomorrow after afebrile for 24 hours)                 Anesthesia consult for possible surgery (need for epidural in labor in case of surgery)                 General surgery consult - significant risk for abdominal and  pelvic adhesions in case of CS                 Discussion of pre-induction transfusion today later this afternoon - started discussion yesterday about blood                 Continue gentamycin and clindamycin today                 Update attending MD today with status and plan  Tomorrow - initiate second IV line (large bored for blood)                     CST in early am - if negative CST will proceed with induction of labor                                                -  if positive will consult for operative delivery                     Plan CNM and nurse 1:1 for intrapartum care / MD attending immediately available to patient                           [name] CNM, MSN 01/01/2012, 8:19 AM  Patient is seen and agree with the plan as above. BPP 8 out of 8 today. Good AFI. Overall fetal strip read assuring and this does not appear to be placental insufficiency. The only times of fetal decelerations are with maternal tachycardia, maternal fever and fetal elevation in baseline. In order to minimize the chance of above will plan round the clock Tylenol to control fever and we discussed blood transfusion this morning. I discussed with patient that maternal anemia can increase the chance of fetal decelerations, as well as increase the chance of maternal and fetal tachycardia. Low hemoglobin can also affect the bodies ability to fight infection. I discussed with the patient that improving her hemoglobin can to help reduce the chance of fetal decelerations, which would increase her risk for a C-section. Patient understands infectious risks of blood transfusion as well as risk of fever and itching. We will plan one unit, slow transfusion, with Tylenol and Benadryl pre-medication. Will continue her gentamicin for her Escherichia coli which is sensitive to gentamicin. Will continue clindamycin as patient is GBS positive and we are planning induction of labor. Will start with a contraction stress test and  if tolerates will continue with induction of labor. Otherwise, will plan primary cesarean section. Anesthesia and general surgery consultants pending given patient's surgical history.  [name] A. 01/01/2012 11:11 AM

## 2012-01-01 NOTE — Progress Notes (Signed)
ANTIBIOTIC CONSULT NOTE - FOLLOW UP  Pharmacy Consult for gentamicin Indication: pyelonephritis  Allergies  Allergen Reactions  . Penicillins Anaphylaxis  . Morphine And Related Itching    Patient Measurements: Height: 5' (152.4 cm) Weight: 114 lb (51.71 kg) IBW/kg (Calculated) : 45.5   Vital Signs: Afebrile VSS  Intake/Output from previous day: I/O = 5620/7550  Labs:  [facility] 12/31/11 1627  WBC 15.0*  HGB 8.5*  PLT 261  LABCREA --  CREATININE 0.76   SCr - 0.76 (est CrCl ~ 83 ml/min)  Gentamicin trough 7/2 at 1230 - 2.9 mcg/mL (goal <1 mcg/mL)  Microbiology: 6/28 - BC x 2   NGTD 6/28 - UC + 25K E. Coli   Sensitive: cefazolin, ceftriaxone, ciprofloxacin, gentamicin, levofloxacin, nitrofurantoin, and tobramycin   Intermediate: Pip/tazo   Resistant: ampicillin, trimeth/sulfa    Medications: 6/28-7/2  Gentamicin 130mg  IV + clindamycin 900mg  IV q8h  7/2 - Gentamicin 130mg  IV q8h  Assessment: Pt is a 21 yo G1PO at [redacted]w[redacted]d currently receiving gentamicin for pyelonephritis. Urine culture positive for gentamicin sensitive E. Coli. Trough and peak scheduled to be drawn around the 1300 dose of gentamicin today. Trough drawn appropriately and was supratherapeutic at 2.9 mcg/mL (goal <1 mcg/mL). The peak was scheduled to be drawn at 1500 today, but the patient began received PRBC prior to this time. The peak was cancelled due to dilutional effect that would have produced an erroneous serum level. Pharmacokinetic calculations were used to roughly extrapolate a peak value and assess the current regimen. Based on this information, frequency will be empirically adjusted. Will hold off on drawing more levels at this time. The patient is being induced in the am. If gentamicin is continued post-partum, will consider re-checking levels.   Goal of Therapy:  Gentamicin peak 6-8 mcg/mL and trough <1 mcg/mL  Plan:  1. Gentamicin 130mg  IV q18h, first dose due 7/3 at 0700 2. If continued  post-partum, will recheck peak and trough to more accurately assess serum concentrations  [name] 01/01/2012,5:10 PM

## 2012-01-01 NOTE — Progress Notes (Signed)
Tmax= 98.2, UOP= + , Pain scale= 0, Ultrasound Report today= Reassuring BPP, so induction planned for tomorrow 7/3. Currently on Gent 130mg  IV Q 8 hr. Plan to check P/T steady state levels @ 1300 dose today.  Expecting induction delivery tomorrow.

## 2012-01-01 NOTE — Progress Notes (Signed)
INFECTIOUS DISEASE PROGRESS NOTE  ID: [name] is a 21 y.o. female with   Active Problems:  Fetal tachycardia   Possible Chorioamnionitis  Hypothyroidism complicating pregnancy  Maternal fever during labor, antepartum  Pyelonephritis complicating pregnancy - 39wk  Subjective: Without complaints, still mild occas cough (dry). No calf pain.   Abtx:  Anti-infectives     Start     Dose/Rate Route Frequency Ordered Stop   12/28/11 2200   clindamycin (CLEOCIN) IVPB 900 mg  Status:  Discontinued        900 mg 100 mL/hr over 30 Minutes Intravenous 3 times per day 12/28/11 2038 12/28/11 2047   12/28/11 2200   clindamycin (CLEOCIN) IVPB 900 mg  Status:  Discontinued        900 mg 100 mL/hr over 30 Minutes Intravenous 3 times per day 12/28/11 2048 12/28/11 2048   12/28/11 2100   vancomycin (VANCOCIN) IVPB 1000 mg/200 mL premix  Status:  Discontinued        1,000 mg 200 mL/hr over 60 Minutes Intravenous Every 12 hours 12/28/11 2003 12/28/11 2038   12/28/11 2100   gentamicin (GARAMYCIN) 130 mg, clindamycin (CLEOCIN) 900 mg in dextrose 5 % 100 mL IVPB        218.5 mL/hr over 30 Minutes Intravenous Every 8 hours 12/28/11 2048     12/28/11 2030   gentamicin (GARAMYCIN) 80 mg in dextrose 5 % 50 mL IVPB  Status:  Discontinued        80 mg 104 mL/hr over 30 Minutes Intravenous Every 8 hours 12/28/11 2009 12/28/11 2038          Medications:  Scheduled:   . acetaminophen  500 mg Oral Once  . acetaminophen  650 mg Oral Q6H  . diphenhydrAMINE  25 mg Intravenous Once  . gentamicin (GARAMYCIN) with clindamycin (CLEOCIN) IV   Intravenous Q8H  . iron polysaccharides  150 mg Oral Daily  . levothyroxine  175 mcg Oral q morning - 10a  . prenatal multivitamin  1 tablet Oral Daily  . DISCONTD: acetaminophen  500 mg Oral Once    Objective: Vital signs in last 24 hours: Temp:  [98.2 F (36.8 C)-99.4 F (37.4 C)] 98.2 F (36.8 C) (07/02 0837) Pulse Rate:  [81-131] 110  (07/02  1022) Resp:  [16-20] 16  (07/02 0837) BP: (97-120)/(57-79) 120/79 mmHg (07/02 0837) SpO2:  [78 %-100 %] 100 % (07/02 1022)   General appearance: alert, cooperative and no distress Resp: clear to auscultation bilaterally Cardio: tachycardia GI: normal findings: bowel sounds normal, soft, non-tender and appopriately gravid Extremities: no cordis noted in LE  Lab Results  Basename 12/31/11 1627  WBC 15.0*  HGB 8.5*  HCT 25.4*  NA 135  K 3.6  CL 103  CO2 21  BUN 6  CREATININE 0.76  GLU --   Liver Panel  Basename 12/31/11 1627  PROT 6.0  ALBUMIN 1.7*  AST 7  ALT <5  ALKPHOS 232*  BILITOT 0.4  BILIDIR --  IBILI --   Sedimentation Rate No results found for this basename: ESRSEDRATE in the last 72 hours C-Reactive Protein No results found for this basename: CRP:2 in the last 72 hours  Microbiology: Recent Results (from the past 240 hour(s))  CULTURE, BLOOD (ROUTINE X 2)     Status: Normal (Preliminary result)   Collection Time   12/28/11  8:55 PM      Component Value Range Status Comment   Specimen Description BLOOD RIGHT ARM   Final  Special Requests BOTTLES DRAWN AEROBIC AND ANAEROBIC   Final    Culture  Setup Time 12/29/2011 01:30   Final    Culture     Final    Value:        BLOOD CULTURE RECEIVED NO GROWTH TO DATE CULTURE WILL BE HELD FOR 5 DAYS BEFORE ISSUING A FINAL NEGATIVE REPORT   Report Status PENDING   Incomplete   CULTURE, BLOOD (ROUTINE X 2)     Status: Normal (Preliminary result)   Collection Time   12/28/11  9:15 PM      Component Value Range Status Comment   Specimen Description BLOOD RIGHT ARM   Final    Special Requests BOTTLES DRAWN AEROBIC AND ANAEROBIC   Final    Culture  Setup Time 12/29/2011 01:30   Final    Culture     Final    Value:        BLOOD CULTURE RECEIVED NO GROWTH TO DATE CULTURE WILL BE HELD FOR 5 DAYS BEFORE ISSUING A FINAL NEGATIVE REPORT   Report Status PENDING   Incomplete   URINE CULTURE     Status: Normal    Collection Time   12/28/11 11:30 PM      Component Value Range Status Comment   Specimen Description URINE, CLEAN CATCH   Final    Special Requests azithromycin   Final    Culture  Setup Time 12/29/2011 03:55   Final    Colony Count 25,000 COLONIES/ML   Final    Culture ESCHERICHIA COLI   Final    Report Status 12/31/2011 FINAL   Final    Organism ID, Bacteria ESCHERICHIA COLI   Final     Studies/Results: Dg Chest 2 View  12/30/2011  *RADIOLOGY REPORT*  Clinical Data: Cough and fever  CHEST - 2 VIEW  Comparison: None  Findings: The heart size and mediastinal contours are within normal limits.  Both lungs are clear.  The visualized skeletal structures are unremarkable.  IMPRESSION: Negative exam.  Original Report Authenticated By: Rosealee Albee, M.D.   US Fetal Bpp W/o Non Stress  01/01/2012  OBSTETRICAL ULTRASOUND: This exam was performed within a [facility]. The OB US report was generated in the AS system, and faxed to the ordering physician.   This report is also available in [employer] and in the [employer]. See AS Obstetric US report.   US Fetal Bpp W/o Non Stress  12/31/2011  OBSTETRICAL ULTRASOUND: This exam was performed within a [facility]. The OB US report was generated in the AS system, and faxed to the ordering physician.   This report is also available in [employer] and in the [employer]. See AS Obstetric US report.     Assessment/Plan: Fever, Leukocytosis (fever resolved due to scheduled tylenol) Gent/Clinda +D-dimer 39 & 5/7 will- check LE dopplers Stop clinda, use gent alone (Cr stable) Check ANA Consider spiral CT consider stopping tylenol Day 5 anbx HIV - x2 in prenatal note/admit H and P.   Johny Sax Infectious Diseases [phone] 01/01/2012, 11:05 AM   LOS: 4 days

## 2012-01-01 NOTE — Consults (Signed)
I have reviewed this patients chart, talked with [name] CNM, and had a conversation with Drs. Malen Gauze and [place] about this patient this morning. The most pressing concern that I can discern is the status of the patients abdomen should a surgical delivery of her now 39 week fetus is necessary. The Anesthesia plan for this young lady will be an early epidural if she goes into labor. The baby has experienced fetal tachycardia today. If she needs a C-S and does not have an epidural a CSE as versus a spinal would be appropriate given the justified concern that the surgical delivery would be difficult and potentially prolonged. She is currently on broad spectrum antibiotics so there is no reason to deny her regional anesthetic. I hope this note addresses your concerns. If there is anything else you may need please feel free to contact us at [phone].  Caren Macadam., M.D.

## 2012-01-01 NOTE — Progress Notes (Signed)
  Addendum:  Negative Venous dopplers - no evidence of DVT or PE.   Clindamycin was discontinued by ID physician without consultation to [facility] providers based on urine culture only.  Ob plan was to continue clindamycin due to impending induction of labor within next 24 hours - rationale for clindamycin based on history of (+) gbs bacturia in pregnancy and CDC treatment  guidelines for treatment of GBS in pregnancy.     Treatment restarted this pm with plan for IOL early am with pitocin and cervical balloon if negative CST.   Patient at risk for chorioamnionitis.  Completed transfusion of 1 unit PRBC this pm - cbc scheduled for 0600 in am.  No blood reaction - temperature remains stable. Three additional units available as needed.  Anesthesia consult requested in person / report and patient history presented to Dr Arby Barrette (anesthesia). Plan epidural intrapartum.   Call to General surgeon - will await return call / call on-call MD in am to give notice of potential surgical patient.  Will compete OCT in am at 0800. If negative - will place cervical balloon and start pitocin induction.  Discussion between OB providers at [facility] for plan for tomorrow - collaborative management plan of complex patient in place.    [name] CNM, MSN

## 2012-01-01 NOTE — Progress Notes (Addendum)
Bilateral lower extremity venous duplex completed.  Preliminary report is negative for DVT, SVT, or a Baker's cyst.  Rouleau flow is noted in the common femoral, femoral, profunda, and popliteal veins bilaterally.

## 2012-01-02 ENCOUNTER — Inpatient Hospital Stay (HOSPITAL_COMMUNITY): Payer: BC Managed Care – PPO | Admitting: Anesthesiology

## 2012-01-02 ENCOUNTER — Encounter (HOSPITAL_COMMUNITY)
Admission: AD | Disposition: A | Discharge status: Home / Self Care | Payer: Self-pay | Source: Ambulatory Visit | Attending: Obstetrics

## 2012-01-02 ENCOUNTER — Encounter (HOSPITAL_COMMUNITY): Payer: Self-pay | Admitting: *Deleted

## 2012-01-02 ENCOUNTER — Encounter (HOSPITAL_COMMUNITY): Payer: Self-pay | Admitting: Anesthesiology

## 2012-01-02 LAB — CBC WITH DIFFERENTIAL/PLATELET
Basophils Absolute: 0 K/uL (ref 0.0–0.1)
Basophils Relative: 0 % (ref 0–1)
Eosinophils Absolute: 0.1 K/uL (ref 0.0–0.7)
Eosinophils Relative: 0 % (ref 0–5)
HCT: 27 % — ABNORMAL LOW (ref 36.0–46.0)
Hemoglobin: 9.1 g/dL — ABNORMAL LOW (ref 12.0–15.0)
Lymphocytes Relative: 15 % (ref 12–46)
Lymphs Abs: 1.9 K/uL (ref 0.7–4.0)
MCH: 29.4 pg (ref 26.0–34.0)
MCHC: 33.7 g/dL (ref 30.0–36.0)
MCV: 87.4 fL (ref 78.0–100.0)
Monocytes Absolute: 0.6 K/uL (ref 0.1–1.0)
Monocytes Relative: 5 % (ref 3–12)
Neutro Abs: 10.3 K/uL — ABNORMAL HIGH (ref 1.7–7.7)
Neutrophils Relative %: 80 % — ABNORMAL HIGH (ref 43–77)
Platelets: 226 K/uL (ref 150–400)
RBC: 3.09 MIL/uL — ABNORMAL LOW (ref 3.87–5.11)
RDW: 15.8 % — ABNORMAL HIGH (ref 11.5–15.5)
WBC: 12.8 K/uL — ABNORMAL HIGH (ref 4.0–10.5)

## 2012-01-02 LAB — ANTI-NUCLEAR AB-TITER (ANA TITER): ANA Titer 1: NEGATIVE

## 2012-01-02 LAB — ANA: Anti Nuclear Antibody(ANA): POSITIVE — AB

## 2012-01-02 LAB — HIV ANTIBODY (ROUTINE TESTING W REFLEX): HIV: NONREACTIVE

## 2012-01-02 SURGERY — Surgical Case
Anesthesia: Regional

## 2012-01-02 MED ORDER — SCOPOLAMINE 1 MG/3DAYS TD PT72
1.0000 | MEDICATED_PATCH | Freq: Once | TRANSDERMAL | Status: DC
  Administered 2012-01-03: 1.5 mg via TRANSDERMAL

## 2012-01-02 MED ORDER — FENTANYL CITRATE 0.05 MG/ML IJ SOLN: 25.0000 ug | INTRAMUSCULAR | Status: DC | PRN

## 2012-01-02 MED ORDER — KETOROLAC TROMETHAMINE 60 MG/2ML IM SOLN
60.0000 mg | Freq: Once | INTRAMUSCULAR | Status: AC | PRN
  Administered 2012-01-02: 60 mg via INTRAMUSCULAR

## 2012-01-02 MED ORDER — EPHEDRINE 5 MG/ML INJ: 10.0000 mg | INTRAVENOUS | Status: DC | PRN

## 2012-01-02 MED ORDER — PHENYLEPHRINE HCL 10 MG/ML IJ SOLN
INTRAMUSCULAR | Status: DC | PRN
  Administered 2012-01-02: 40 ug via INTRAVENOUS
  Administered 2012-01-02 (×3): 80 ug via INTRAVENOUS
  Administered 2012-01-02: 40 ug via INTRAVENOUS
  Administered 2012-01-02: 80 ug via INTRAVENOUS

## 2012-01-02 MED ORDER — OXYTOCIN 40 UNITS IN LACTATED RINGERS INFUSION - SIMPLE MED
INTRAVENOUS | Status: DC | PRN
  Administered 2012-01-02: 40 [IU] via INTRAVENOUS

## 2012-01-02 MED ORDER — ONDANSETRON HCL 4 MG/2ML IJ SOLN
INTRAMUSCULAR | Status: DC | PRN
  Administered 2012-01-02: 4 mg via INTRAVENOUS

## 2012-01-02 MED ORDER — KETOROLAC TROMETHAMINE 60 MG/2ML IM SOLN
INTRAMUSCULAR | Status: AC
  Administered 2012-01-02: 60 mg via INTRAMUSCULAR
  Filled 2012-01-02: qty 2

## 2012-01-02 MED ORDER — LACTATED RINGERS IV SOLN
INTRAVENOUS | Status: DC
  Administered 2012-01-02: 16:00:00 via INTRAUTERINE

## 2012-01-02 MED ORDER — CITRIC ACID-SODIUM CITRATE 334-500 MG/5ML PO SOLN
ORAL | Status: AC
  Administered 2012-01-02: 30 mL
  Filled 2012-01-02: qty 15

## 2012-01-02 MED ORDER — DIPHENHYDRAMINE HCL 50 MG/ML IJ SOLN: 12.5000 mg | INTRAMUSCULAR | Status: DC | PRN

## 2012-01-02 MED ORDER — CITRIC ACID-SODIUM CITRATE 334-500 MG/5ML PO SOLN
ORAL | Status: AC
  Filled 2012-01-02: qty 15

## 2012-01-02 MED ORDER — MORPHINE SULFATE (PF) 0.5 MG/ML IJ SOLN
INTRAMUSCULAR | Status: DC | PRN
  Administered 2012-01-02: 4 mg via EPIDURAL

## 2012-01-02 MED ORDER — EPHEDRINE 5 MG/ML INJ
10.0000 mg | INTRAVENOUS | Status: DC | PRN
  Filled 2012-01-02: qty 4

## 2012-01-02 MED ORDER — PHENYLEPHRINE 40 MCG/ML (10ML) SYRINGE FOR IV PUSH (FOR BLOOD PRESSURE SUPPORT): 80.0000 ug | PREFILLED_SYRINGE | INTRAVENOUS | Status: DC | PRN

## 2012-01-02 MED ORDER — MEPERIDINE HCL 25 MG/ML IJ SOLN: 6.2500 mg | INTRAMUSCULAR | Status: DC | PRN

## 2012-01-02 MED ORDER — BUPIVACAINE HCL (PF) 0.25 % IJ SOLN
INTRAMUSCULAR | Status: AC
  Filled 2012-01-02: qty 30

## 2012-01-02 MED ORDER — MORPHINE SULFATE 0.5 MG/ML IJ SOLN
INTRAMUSCULAR | Status: AC
  Filled 2012-01-02: qty 10

## 2012-01-02 MED ORDER — SODIUM BICARBONATE 8.4 % IV SOLN
INTRAVENOUS | Status: DC | PRN
  Administered 2012-01-02: 5 mL via EPIDURAL

## 2012-01-02 MED ORDER — SODIUM CHLORIDE 0.9 % IJ SOLN
INTRAMUSCULAR | Status: AC
  Filled 2012-01-02: qty 6

## 2012-01-02 MED ORDER — LACTATED RINGERS IV SOLN: 500.0000 mL | Freq: Once | INTRAVENOUS | Status: DC

## 2012-01-02 MED ORDER — OXYTOCIN 40 UNITS IN LACTATED RINGERS INFUSION - SIMPLE MED
1.0000 m[IU]/min | INTRAVENOUS | Status: DC
  Administered 2012-01-02: 6 m[IU]/min via INTRAVENOUS
  Administered 2012-01-02: 2 m[IU]/min via INTRAVENOUS
  Filled 2012-01-02: qty 1000

## 2012-01-02 MED ORDER — LIDOCAINE HCL (PF) 1 % IJ SOLN
INTRAMUSCULAR | Status: DC | PRN
  Administered 2012-01-02 (×2): 8 mL

## 2012-01-02 MED ORDER — ONDANSETRON HCL 4 MG PO TABS: 4.0000 mg | ORAL_TABLET | ORAL | Status: DC | PRN

## 2012-01-02 MED ORDER — BUPIVACAINE HCL (PF) 0.25 % IJ SOLN
INTRAMUSCULAR | Status: DC | PRN
  Administered 2012-01-02: 30 mL

## 2012-01-02 MED ORDER — FENTANYL 2.5 MCG/ML BUPIVACAINE 1/10 % EPIDURAL INFUSION (WH - ANES)
14.0000 mL/h | INTRAMUSCULAR | Status: DC
  Filled 2012-01-02 (×2): qty 60

## 2012-01-02 MED ORDER — FENTANYL 2.5 MCG/ML BUPIVACAINE 1/10 % EPIDURAL INFUSION (WH - ANES)
INTRAMUSCULAR | Status: DC | PRN
  Administered 2012-01-02: 14 mL/h
  Administered 2012-01-02: 14 mL/h via EPIDURAL

## 2012-01-02 MED ORDER — PHENYLEPHRINE 40 MCG/ML (10ML) SYRINGE FOR IV PUSH (FOR BLOOD PRESSURE SUPPORT)
80.0000 ug | PREFILLED_SYRINGE | INTRAVENOUS | Status: DC | PRN
  Filled 2012-01-02: qty 5

## 2012-01-02 MED ORDER — ONDANSETRON HCL 4 MG/2ML IJ SOLN: 4.0000 mg | INTRAMUSCULAR | Status: DC | PRN

## 2012-01-02 SURGICAL SUPPLY — 30 items
CLOTH BEACON ORANGE TIMEOUT ST (SAFETY) ×2 IMPLANT
CONTAINER PREFILL 10% NBF 15ML (MISCELLANEOUS) IMPLANT
DRESSING TELFA 8X3 (GAUZE/BANDAGES/DRESSINGS) IMPLANT
ELECT REM PT RETURN 9FT ADLT (ELECTROSURGICAL) ×2
ELECTRODE REM PT RTRN 9FT ADLT (ELECTROSURGICAL) ×1 IMPLANT
EXTRACTOR VACUUM M CUP 4 TUBE (SUCTIONS) IMPLANT
GAUZE SPONGE 4X4 12PLY STRL LF (GAUZE/BANDAGES/DRESSINGS) ×4 IMPLANT
GLOVE BIO SURGEON STRL SZ7.5 (GLOVE) ×4 IMPLANT
GOWN PREVENTION PLUS LG XLONG (DISPOSABLE) ×4 IMPLANT
GOWN PREVENTION PLUS XLARGE (GOWN DISPOSABLE) ×2 IMPLANT
KIT ABG SYR 3ML LUER SLIP (SYRINGE) IMPLANT
NDL HYPO 25X1 1.5 SAFETY (NEEDLE) ×1 IMPLANT
NDL HYPO 25X5/8 SAFETYGLIDE (NEEDLE) IMPLANT
NEEDLE HYPO 25X1 1.5 SAFETY (NEEDLE) ×2 IMPLANT
NEEDLE HYPO 25X5/8 SAFETYGLIDE (NEEDLE) IMPLANT
NS IRRIG 1000ML POUR BTL (IV SOLUTION) ×2 IMPLANT
PACK C SECTION WH (CUSTOM PROCEDURE TRAY) ×2 IMPLANT
PAD ABD 7.5X8 STRL (GAUZE/BANDAGES/DRESSINGS) IMPLANT
SLEEVE SCD COMPRESS KNEE MED (MISCELLANEOUS) IMPLANT
STAPLER VISISTAT 35W (STAPLE) ×2 IMPLANT
SUT MNCRL 0 VIOLET CTX 36 (SUTURE) ×2 IMPLANT
SUT MON AB 2-0 CT1 27 (SUTURE) ×2 IMPLANT
SUT MON AB-0 CT1 36 (SUTURE) ×4 IMPLANT
SUT MONOCRYL 0 CTX 36 (SUTURE) ×2
SUT PLAIN 0 NONE (SUTURE) IMPLANT
SUT PLAIN 2 0 XLH (SUTURE) IMPLANT
SYR CONTROL 10ML LL (SYRINGE) ×2 IMPLANT
TOWEL OR 17X24 6PK STRL BLUE (TOWEL DISPOSABLE) ×4 IMPLANT
TRAY FOLEY CATH 14FR (SET/KITS/TRAYS/PACK) ×2 IMPLANT
WATER STERILE IRR 1000ML POUR (IV SOLUTION) ×2 IMPLANT

## 2012-01-02 NOTE — Op Note (Signed)
Cesarean Section Procedure Note  Indications: non-reassuring fetal status  Pre-operative Diagnosis: 39 week 3 day pregnancy.  Post-operative Diagnosis: same  Surgeon: Lenoard Aden   Assistants: Fredric Mare  Anesthesia: epidural anesthesia  ASA Class: 2  Procedure Details  The patient was seen in the Holding Room. The risks, benefits, complications, treatment options, and expected outcomes were discussed with the patient.  The patient concurred with the proposed plan, giving informed consent. The risks of anesthesia, infection, bleeding and possible injury to other organs discussed. Injury to bowel, bladder, or ureter with possible need for repair discussed. Possible need for transfusion with secondary risks of hepatitis or HIV acquisition discussed. Post operative complications to include but not limited to DVT, PE and Pneumonia noted. The site of surgery properly noted/marked. The patient was taken to Operating Room # 1, identified as [name] and the procedure verified as C-Section Delivery. A Time Out was held and the above information confirmed.  After induction of anesthesia, the patient was draped and prepped in the usual sterile manner. A Pfannenstiel incision was made and carried down through the subcutaneous tissue to the fascia. Fascial incision was made and extended transversely using Mayo scissors. The fascia was separated from the underlying rectus tissue superiorly and inferiorly. The peritoneum was identified and entered. Peritoneal incision was extended longitudinally. The utero-vesical peritoneal reflection was incised transversely and the bladder flap was bluntly freed from the lower uterine segment. A low transverse uterine incision(Kerr hysterotomy) was made. Delivered from OT presentation  With Robinson x one reduced was a  female with Apgar scores of 8 at one minute and 9 at five minutes.  Cord ph 7.32. Bulb suctioning gently performed. Neonatal team in attendance.After  the umbilical cord was clamped and cut cord blood was obtained for evaluation. The placenta was removed intact and appeared normal. The uterus was curetted with a dry lap pack. Good hemostasis was noted.The uterine outline, tubes and ovaries appeared normal. The uterine incision was closed with running locked sutures of 0 Monocryl x 2 layers. Hemostasis was observed. Lavage was carried out until clear.The parietal peritoneum was closed with a running 2-0 Monocryl suture. The fascia was then reapproximated with running sutures of 0 Monocryl. The skin was reapproximated with staples.  Instrument, sponge, and needle counts were correct prior the abdominal closure and at the conclusion of the case.   Findings: above  Estimated Blood Loss:  300 mL         Drains: foley                 Specimens: placenta to path                 Complications:  None; patient tolerated the procedure well.         Disposition: PACU - hemodynamically stable.         Condition: stable  Attending Attestation: I performed the procedure.

## 2012-01-02 NOTE — Anesthesia Procedure Notes (Signed)
Epidural Patient location during procedure: OB Start time: 01/02/2012 4:23 PM End time: 01/02/2012 4:27 PM  Staffing Anesthesiologist: Sandrea Hughs Performed by: anesthesiologist   Preanesthetic Checklist Completed: patient identified, site marked, surgical consent, pre-op evaluation, timeout performed, IV checked, risks and benefits discussed and monitors and equipment checked  Epidural Patient position: sitting Prep: site prepped and draped and DuraPrep Patient monitoring: continuous pulse ox and blood pressure Approach: midline Injection technique: LOR air  Needle:  Needle type: Tuohy  Needle gauge: 17 G Needle length: 9 cm Needle insertion depth: 4 cm Catheter type: closed end flexible Catheter size: 19 Gauge Catheter at skin depth: 9 cm Test dose: negative and Other  Assessment Sensory level: T10 Events: blood not aspirated, injection not painful, no injection resistance, negative IV test and no paresthesia

## 2012-01-02 NOTE — Anesthesia Preprocedure Evaluation (Addendum)
Anesthesia Evaluation  Patient identified by MRN, date of birth, ID band Patient awake    Reviewed: Allergy & Precautions, H&P , NPO status , Patient's Chart, lab work & pertinent test results  Airway Mallampati: I TM Distance: >3 FB Neck ROM: full    Dental No notable dental hx.    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   Endo/Other  Hypothyroidism   Renal/GU negative Renal ROS  negative genitourinary   Musculoskeletal negative musculoskeletal ROS (+)   Abdominal Normal abdominal exam  (+)   Peds negative pediatric ROS (+)  Hematology negative hematology ROS (+)   Anesthesia Other Findings   Reproductive/Obstetrics (+) Pregnancy (STAT C/S for NRFS)                         Anesthesia Physical Anesthesia Plan  ASA: II and Emergent  Anesthesia Plan: Epidural   Post-op Pain Management:    Induction:   Airway Management Planned:   Additional Equipment:   Intra-op Plan:   Post-operative Plan:   Informed Consent: I have reviewed the patients History and Physical, chart, labs and discussed the procedure including the risks, benefits and alternatives for the proposed anesthesia with the patient or authorized representative who has indicated his/her understanding and acceptance.     Plan Discussed with: Surgeon and CRNA  Anesthesia Plan Comments:        Anesthesia Quick Evaluation

## 2012-01-02 NOTE — Progress Notes (Signed)
Patient ID: [name], female   DOB: 1991-02-15, 21 y.o.   MRN: [id number]  S: Feeling no pain - - just bad cramps and backache     Tolerating contractions well   O:  VS: Blood pressure 116/74, pulse 107, temperature 98.9 F (37.2 C), temperature source Oral, resp. rate 16, height 5' (1.524 m), weight 51.71 kg (114 lb), SpO2 75.00%, unknown if currently breastfeeding.        FHR : baseline 140 / variability moderate / accels +  / decels variable decels with each ctx        Toco: contractions every 2-3 minutes / pitocin 12 mu/min         Cervix : 7-8 cm / 90% / vtx 0 station        Membranes: AROM - thin green MSF                               IUPC placed for amnioinfusion  A: Active labor     FHR category 2  P: place epidural     Amnioinfusion for repetitive variable decels     Monitor closely     Dr Billy Coast updated with status    [name] CNM, MSN 01/02/2012, 3:57 PM

## 2012-01-02 NOTE — Transfer of Care (Signed)
Immediate Anesthesia Transfer of Care Note  Patient: [name]  Procedure(s) Performed: Procedure(s) (LRB): CESAREAN SECTION (N/A)  Patient Location: PACU  Anesthesia Type: Regional  Level of Consciousness: awake, alert  and oriented  Airway & Oxygen Therapy: Patient Spontanous Breathing  Post-op Assessment: Report given to PACU RN  Post vital signs: Reviewed and stable  Complications: No apparent anesthesia complications

## 2012-01-02 NOTE — Progress Notes (Signed)
CNM attempting to reduce cervix by having pt push with contractions.

## 2012-01-02 NOTE — Progress Notes (Signed)
[name] is a 21 y.o. G4P0020 at [redacted]w[redacted]d by LMP admitted for induction of labor due to Fever, tachycardia and poorly controlled Thyroid disease. Delivery recommended by MFM.  Subjective: Comfortable  Objective: BP 114/61  Pulse 81  Temp 97.9 F (36.6 C) (Oral)  Resp 16  Ht 5' (1.524 m)  Wt 51.71 kg (114 lb)  BMI 22.26 kg/m2  SpO2 100%  Breastfeeding? Unknown I/O last 3 completed shifts: In: 6158.6 [P.O.:1370; I.V.:3832.1; Blood:350; Other:350; IV Piggyback:256.5] Out: 5950 [Urine:5950] Total I/O In: 637.8 [I.V.:637.8] Out: 900 [Urine:900]  FHT:  FHR: 80-110 bpm, variability: moderate,  accelerations:  Abscent,  decelerations:  Present recurrent deep variables with late return UC:   regular, every 2 minutes SVE:   Dilation: 9 Effacement (%): 100 Station: 0 Exam by:: [name], CNM No cervical change x 3 hours. Unable to reduce cervix.  Labs: Lab Results  Component Value Date   WBC 12.8* 01/02/2012   HGB 9.1* 01/02/2012   HCT 27.0* 01/02/2012   MCV 87.4 01/02/2012   PLT 226 01/02/2012    Assessment / Plan: Arrest in active phase of labor Non reassuring FHR  Labor: Emergent Cesarean section Preeclampsia:  na Fetal Wellbeing:  Category III Pain Control:  Epidural I/D:  n/a Anticipated MOD:  Cesarean section discussed. OR called. Possible need for transfusion previously discussed. Will make General Surgery aware.  [name] 01/02/2012, 8:56 PM

## 2012-01-02 NOTE — Anesthesia Postprocedure Evaluation (Signed)
Anesthesia Post Note  Patient: [name]  Procedure(s) Performed: Procedure(s) (LRB): CESAREAN SECTION (N/A)  Anesthesia type: Epidural  Patient location: PACU  Post pain: Pain level controlled  Post assessment: Post-op Vital signs reviewed  Last Vitals:  Filed Vitals:   01/02/12 2315  BP: 108/82  Pulse: 80  Temp:   Resp: 13    Post vital signs: stable  Level of consciousness: awake  Complications: No apparent anesthesia complications

## 2012-01-02 NOTE — Progress Notes (Signed)
UR chart review completed.  

## 2012-01-02 NOTE — Progress Notes (Signed)
Patient ID: [name], female   DOB: 02/01/1991, 21 y.o.   MRN: [id number]  S: Feeling ok - no pain     Tolerating contractions well     Unsure about analgesia or epidural - discussed benefit of epidural   O:  VS: Blood pressure 116/74, pulse 107, temperature 98.9 F (37.2 C), temperature source Oral, resp. rate 16, height 5' (1.524 m), weight 51.71 kg (114 lb), SpO2 75.00%, unknown if currently breastfeeding.        FHR : baseline 155 / variability moderate / accels +  / decels few variables / no prolonged decels        Toco: contractions every 2 minutes / pitocin at 10 mu/min        Cervical balloon out        Cervix : 6 / 90 / vtx / 0 / BBOW        Membranes: Bulging  A: Latent - active labor     FHR category 1  P: Increase pitocin per protocol for induction      Recheck in 30-60 minutes      Encouraged epidural prior to AROM - patient considering      AROM with active labor - cervical progression   [name] CNM, MSN 01/02/2012, 2:51 PM

## 2012-01-02 NOTE — Progress Notes (Signed)
Patient ID: [name], female   DOB: [birth year], 21 y.o.   MRN: [id number]  S: Feeling no pain or pressure      Epidural effective   O:  VS: Blood pressure 131/77, pulse 91, temperature 98.5 F (36.9 C), temperature source Oral, resp. rate 16, height 5' (1.524 m), weight 51.71 kg (114 lb), SpO2 100.00%, unknown if currently breastfeeding.        FHR : baseline 140 / variability moderate / accels +  / decels variable with each ctx                  Prolonged variable to 90 x 4 minutes - pitocin off / O2 mask / position change                  Recover to 150 x 1 minutes with decels x 5 minutes to 90s  - placed in hands-knees                  FHR 145 / mild variables (+)         Toco: contractions every 2-3 minutes / pitocin at 6 mu//min then stopped with prolonged decel        Cervix : 9cm / non-reducible / vtx / +1 / + show        Membranes: amniotic cleared with amnioinfusion  A: Active  labor     FHR category 2 - recurrent variables with prolonged decel with recovery  P: Updated Dr Billy Coast with status and FHR pattern     Maintain position to promote fetal well-being / fetal descent    Continue labor plan with hope for SVB in next 1-2-hours   [name] CNM, MSN 01/02/2012, 6:07 PM

## 2012-01-02 NOTE — Progress Notes (Signed)
INFECTIOUS DISEASE PROGRESS NOTE  ID: [name] is a 21 y.o. female with  Active Problems:  Fetal tachycardia   Possible Chorioamnionitis  Hypothyroidism complicating pregnancy  Maternal fever during labor, antepartum  Pyelonephritis complicating pregnancy - 39wk  Subjective: Without complaint No SOB, occas dry cough.  No dysuria. She has occas loose BM (with each infusion of anbx).   Abtx:  Anti-infectives     Start     Dose/Rate Route Frequency Ordered Stop   01/02/12 0700   gentamicin (GARAMYCIN) 130 mg in dextrose 5 % 50 mL IVPB        130 mg 106.5 mL/hr over 30 Minutes Intravenous Every 18 hours 01/01/12 1743     01/01/12 2200   clindamycin (CLEOCIN) IVPB 900 mg        900 mg 100 mL/hr over 30 Minutes Intravenous 3 times per day 01/01/12 2016     01/01/12 1300   gentamicin (GARAMYCIN) 130 mg in dextrose 5 % 50 mL IVPB  Status:  Discontinued        130 mg 106.5 mL/hr over 30 Minutes Intravenous Every 8 hours 01/01/12 1127 01/01/12 1743   12/28/11 2200   clindamycin (CLEOCIN) IVPB 900 mg  Status:  Discontinued        900 mg 100 mL/hr over 30 Minutes Intravenous 3 times per day 12/28/11 2038 12/28/11 2047   12/28/11 2200   clindamycin (CLEOCIN) IVPB 900 mg  Status:  Discontinued        900 mg 100 mL/hr over 30 Minutes Intravenous 3 times per day 12/28/11 2048 12/28/11 2048   12/28/11 2100   vancomycin (VANCOCIN) IVPB 1000 mg/200 mL premix  Status:  Discontinued        1,000 mg 200 mL/hr over 60 Minutes Intravenous Every 12 hours 12/28/11 2003 12/28/11 2038   12/28/11 2100   gentamicin (GARAMYCIN) 130 mg, clindamycin (CLEOCIN) 900 mg in dextrose 5 % 100 mL IVPB  Status:  Discontinued        218.5 mL/hr over 30 Minutes Intravenous Every 8 hours 12/28/11 2048 01/01/12 1127   12/28/11 2030   gentamicin (GARAMYCIN) 80 mg in dextrose 5 % 50 mL IVPB  Status:  Discontinued        80 mg 104 mL/hr over 30 Minutes Intravenous Every 8 hours 12/28/11 2009 12/28/11 2038            Medications:  Scheduled:   . acetaminophen  500 mg Oral Once  . acetaminophen  650 mg Oral Q6H  . clindamycin (CLEOCIN) IV  900 mg Intravenous Q8H  . gentamicin  130 mg Intravenous Q18H  . iron polysaccharides  150 mg Oral Daily  . levothyroxine  175 mcg Oral q morning - 10a  . prenatal multivitamin  1 tablet Oral Daily  . DISCONTD: gentamicin  130 mg Intravenous Q8H    Objective: Vital signs in last 24 hours: Temp:  [97.8 F (36.6 C)-99.6 F (37.6 C)] 99.6 F (37.6 C) (07/03 1301) Pulse Rate:  [57-122] 109  (07/03 1301) Resp:  [16-18] 16  (07/03 1301) BP: (102-148)/(65-85) 119/79 mmHg (07/03 1301) SpO2:  [75 %-99 %] 75 % (07/02 1344)   General appearance: alert, cooperative and no distress Resp: clear to auscultation bilaterally Cardio: regular rate and rhythm GI: normal findings: bowel sounds normal, soft, non-tender and appropriately gravid Extremities: edema none and no cordis in LE  Lab Results  Basename 01/02/12 0540 12/31/11 1627  WBC 12.8* 15.0*  HGB 9.1* 8.5*  HCT  27.0* 25.4*  NA -- 135  K -- 3.6  CL -- 103  CO2 -- 21  BUN -- 6  CREATININE -- 0.76  GLU -- --   Liver Panel  Basename 12/31/11 1627  PROT 6.0  ALBUMIN 1.7*  AST 7  ALT <5  ALKPHOS 232*  BILITOT 0.4  BILIDIR --  IBILI --   Sedimentation Rate No results found for this basename: ESRSEDRATE in the last 72 hours C-Reactive Protein No results found for this basename: CRP:2 in the last 72 hours  Microbiology: Recent Results (from the past 240 hour(s))  CULTURE, BLOOD (ROUTINE X 2)     Status: Normal (Preliminary result)   Collection Time   12/28/11  8:55 PM      Component Value Range Status Comment   Specimen Description BLOOD RIGHT ARM   Final    Special Requests BOTTLES DRAWN AEROBIC AND ANAEROBIC   Final    Culture  Setup Time 12/29/2011 01:30   Final    Culture     Final    Value:        BLOOD CULTURE RECEIVED NO GROWTH TO DATE CULTURE WILL BE HELD FOR 5 DAYS  BEFORE ISSUING A FINAL NEGATIVE REPORT   Report Status PENDING   Incomplete   CULTURE, BLOOD (ROUTINE X 2)     Status: Normal (Preliminary result)   Collection Time   12/28/11  9:15 PM      Component Value Range Status Comment   Specimen Description BLOOD RIGHT ARM   Final    Special Requests BOTTLES DRAWN AEROBIC AND ANAEROBIC   Final    Culture  Setup Time 12/29/2011 01:30   Final    Culture     Final    Value:        BLOOD CULTURE RECEIVED NO GROWTH TO DATE CULTURE WILL BE HELD FOR 5 DAYS BEFORE ISSUING A FINAL NEGATIVE REPORT   Report Status PENDING   Incomplete   URINE CULTURE     Status: Normal   Collection Time   12/28/11 11:30 PM      Component Value Range Status Comment   Specimen Description URINE, CLEAN CATCH   Final    Special Requests azithromycin   Final    Culture  Setup Time 12/29/2011 03:55   Final    Colony Count 25,000 COLONIES/ML   Final    Culture ESCHERICHIA COLI   Final    Report Status 12/31/2011 FINAL   Final    Organism ID, Bacteria ESCHERICHIA COLI   Final     Studies/Results: US Fetal Bpp W/o Non Stress  01/01/2012  OBSTETRICAL ULTRASOUND: This exam was performed within a [place] [facility] Department. The OB US report was generated in the [facility] system, and faxed to the ordering physician.   This report is also available in [employer] and in the [employer]. See [facility] Obstetric US report.     Assessment/Plan: Fever in pregnancy GBS E coli UTI Day 6 anbx (clinda/gent) Would- stop tylenol post-partum (hard to say she is afebrile when she is getting round the clock tylenol) Stop anbx post partum to see if fever source manifests (she has received IDSA recommended duration of rx for UTI in pregnancy) Repeat UCx in 1 week Check ANA titer Check Parvo Check stool C diff (this seems unlikely given relation of anbx and her loose BM, however clinda is stronly associated with C diff and health system is limiting its use due to  concerns  about C diff) Pt on clinda for induction of labor/gbs. Prior to today, she was not in labor and membranes were intact.  Will defer to OB's expertise at this point in managing her GBS carriage.  Please call if questions, concerns Dr Orvan Falconer available 01-03-12    Johny Sax Infectious Diseases [phone] 01/02/2012, 1:08 PM   LOS: 5 days

## 2012-01-02 NOTE — Progress Notes (Signed)
Patient ID: [name], female   DOB: May 28, 1991, 21 y.o.   MRN: [id number]   S: Feeling frustrated - wants out of bed     Tolerating contractions well - some cramping   O:  VS: Blood pressure 117/77, pulse 98, temperature 98.4 F (36.9 C), temperature source Oral, resp. rate 16, height 5' (1.524 m), weight 51.71 kg (114 lb), SpO2 75.00%, unknown if currently breastfeeding.        FHR : baseline 145 / variability moderate / accels 10x10 / decels mild variables        OCT : negative - no late decels / + repetitive variable decels with most ctx        Cervix : 3/ 90/ vtx -1        Membranes: intact        Cervical balloon placed without difficulty by Raelyn Mora SNM - inflated with 60 ml NS         Tolerated well / secured to leg          Pitocin to 8 mu/min (down from 14 mu/min for OCT)   TC Dr Abbey Chatters (on-call surgeon today)- in OR [facility] / nurse report of labor induction at [facility] - if requires CS may need general surgeon due to hx bowel surgery and anticipated pelvic and abdominal adhesions.  A/P: Induction of labor         FHR overall reassuring - category 1         Proceed with cervical balloon with pitocin induction         Traction to balloon every 1-2 hours - active management    [name] CNM, MSN 01/02/2012, 12:33 PM

## 2012-01-02 NOTE — Progress Notes (Signed)
[name] is a 21 y.o. G4P0020 at [redacted]w[redacted]d, well dated. Brought to LDR for IOL after being afebrile for 24 hrs, stable WBC (slightly improved) and s/p 1 unit pRBCs with slightly improved Hgb as well.  Patient's H&P and hospital course reviewed and discussed with primary provider CNM Fredric Mare.    Objective: BP 112/76  Pulse 87  Temp 98.4 F (36.9 C) (Oral)  Resp 16  Ht 5' (1.524 m)  Wt 51.71 kg (114 lb)  BMI 22.26 kg/m2  SpO2 75%  Breastfeeding? Unknown I/O last 3 completed shifts: In: 6888.6 [P.O.:2140; I.V.:3842.1; Blood:350; Other:350; IV Piggyback:206.5] Out: 7025 [Urine:7025] Total I/O In: 306.4 [I.V.:306.4] Out: 1000 [Urine:1000]  FHT: 150s/ + accels/ no decels/ moderate variab - category I this morning.  UC:   Irregular SVE:   Dilation: 2.5 Effacement (%): 80 Station: -1 Exam by:: Carloyn Jaeger SNM  Labs: Lab Results  Component Value Date   WBC 12.8* 01/02/2012   HGB 9.1* 01/02/2012   HCT 27.0* 01/02/2012   MCV 87.4 01/02/2012   PLT 226 01/02/2012    Assessment / Plan: 39+ weeks pregnant  Pyelo - Ecoli (r) / improving clinically  URI - no evidence of bronchitis or pneumonia  Maternal fever - stable, afebrile >24 hrs and no new focal findings  Uncontrolled hypothyroidism (noncompliance issues)  Non-reactive EFM with intermittent tachycardia and fetal decels / questionable tachyarrhythemia intermittently - much improved since maternal fever resolved Anemia (8.5) - slightly improved 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    Kizer Nobbe R 01/02/2012, 11:07 AM

## 2012-01-03 ENCOUNTER — Encounter (HOSPITAL_COMMUNITY): Payer: Self-pay

## 2012-01-03 DIAGNOSIS — IMO0002 Reserved for concepts with insufficient information to code with codable children: Secondary | ICD-10-CM | POA: Diagnosis present

## 2012-01-03 LAB — CBC
HCT: 28.1 % — ABNORMAL LOW (ref 36.0–46.0)
Hemoglobin: 9.3 g/dL — ABNORMAL LOW (ref 12.0–15.0)
WBC: 14.1 10*3/uL — ABNORMAL HIGH (ref 4.0–10.5)

## 2012-01-03 LAB — CLOSTRIDIUM DIFFICILE BY PCR: Toxigenic C. Difficile by PCR: NEGATIVE

## 2012-01-03 MED ORDER — DIPHENHYDRAMINE HCL 25 MG PO CAPS: 25.0000 mg | ORAL_CAPSULE | Freq: Four times a day (QID) | ORAL | Status: DC | PRN

## 2012-01-03 MED ORDER — DIPHENHYDRAMINE HCL 50 MG/ML IJ SOLN: 12.5000 mg | INTRAMUSCULAR | Status: DC | PRN

## 2012-01-03 MED ORDER — WITCH HAZEL-GLYCERIN EX PADS: 1.0000 "application " | MEDICATED_PAD | CUTANEOUS | Status: DC | PRN

## 2012-01-03 MED ORDER — SIMETHICONE 80 MG PO CHEW
80.0000 mg | CHEWABLE_TABLET | Freq: Three times a day (TID) | ORAL | Status: DC
  Administered 2012-01-03 – 2012-01-05 (×8): 80 mg via ORAL

## 2012-01-03 MED ORDER — PRENATAL MULTIVITAMIN CH: 1.0000 | ORAL_TABLET | Freq: Every day | ORAL | Status: DC

## 2012-01-03 MED ORDER — SCOPOLAMINE 1 MG/3DAYS TD PT72
MEDICATED_PATCH | TRANSDERMAL | Status: AC
  Administered 2012-01-03: 1.5 mg via TRANSDERMAL
  Filled 2012-01-03: qty 1

## 2012-01-03 MED ORDER — ONDANSETRON HCL 4 MG/2ML IJ SOLN: 4.0000 mg | Freq: Three times a day (TID) | INTRAMUSCULAR | Status: DC | PRN

## 2012-01-03 MED ORDER — IBUPROFEN 600 MG PO TABS
600.0000 mg | ORAL_TABLET | Freq: Four times a day (QID) | ORAL | Status: DC
  Administered 2012-01-03 – 2012-01-05 (×9): 600 mg via ORAL

## 2012-01-03 MED ORDER — MENTHOL 3 MG MT LOZG
1.0000 | LOZENGE | OROMUCOSAL | Status: DC | PRN
  Filled 2012-01-03: qty 9

## 2012-01-03 MED ORDER — IBUPROFEN 600 MG PO TABS
600.0000 mg | ORAL_TABLET | Freq: Four times a day (QID) | ORAL | Status: DC | PRN
  Filled 2012-01-03 (×9): qty 1

## 2012-01-03 MED ORDER — KETOROLAC TROMETHAMINE 30 MG/ML IJ SOLN
30.0000 mg | Freq: Four times a day (QID) | INTRAMUSCULAR | Status: AC | PRN
  Administered 2012-01-03: 30 mg via INTRAVENOUS
  Filled 2012-01-03: qty 1

## 2012-01-03 MED ORDER — METHYLERGONOVINE MALEATE 0.2 MG PO TABS: 0.2000 mg | ORAL_TABLET | ORAL | Status: DC | PRN

## 2012-01-03 MED ORDER — KETOROLAC TROMETHAMINE 30 MG/ML IJ SOLN: 30.0000 mg | Freq: Four times a day (QID) | INTRAMUSCULAR | Status: AC | PRN

## 2012-01-03 MED ORDER — SENNOSIDES-DOCUSATE SODIUM 8.6-50 MG PO TABS
2.0000 | ORAL_TABLET | Freq: Every day | ORAL | Status: DC
  Administered 2012-01-03: 2 via ORAL

## 2012-01-03 MED ORDER — NALBUPHINE HCL 10 MG/ML IJ SOLN
5.0000 mg | INTRAMUSCULAR | Status: DC | PRN
  Administered 2012-01-03 (×2): 10 mg via INTRAVENOUS
  Filled 2012-01-03 (×3): qty 1

## 2012-01-03 MED ORDER — LACTATED RINGERS IV SOLN
INTRAVENOUS | Status: DC
  Administered 2012-01-03: 05:00:00 via INTRAVENOUS

## 2012-01-03 MED ORDER — METHYLERGONOVINE MALEATE 0.2 MG/ML IJ SOLN: 0.2000 mg | INTRAMUSCULAR | Status: DC | PRN

## 2012-01-03 MED ORDER — METOCLOPRAMIDE HCL 5 MG/ML IJ SOLN: 10.0000 mg | Freq: Three times a day (TID) | INTRAMUSCULAR | Status: DC | PRN

## 2012-01-03 MED ORDER — NALBUPHINE HCL 10 MG/ML IJ SOLN
5.0000 mg | INTRAMUSCULAR | Status: DC | PRN
  Administered 2012-01-03: 5 mg via SUBCUTANEOUS
  Filled 2012-01-03 (×2): qty 1

## 2012-01-03 MED ORDER — SODIUM CHLORIDE 0.9 % IV SOLN
1.0000 ug/kg/h | INTRAVENOUS | Status: DC | PRN
  Filled 2012-01-03: qty 2.5

## 2012-01-03 MED ORDER — TETANUS-DIPHTH-ACELL PERTUSSIS 5-2.5-18.5 LF-MCG/0.5 IM SUSP: 0.5000 mL | Freq: Once | INTRAMUSCULAR | Status: DC

## 2012-01-03 MED ORDER — DIBUCAINE 1 % RE OINT: 1.0000 "application " | TOPICAL_OINTMENT | RECTAL | Status: DC | PRN

## 2012-01-03 MED ORDER — SODIUM CHLORIDE 0.9 % IJ SOLN: 3.0000 mL | INTRAMUSCULAR | Status: DC | PRN

## 2012-01-03 MED ORDER — DIPHENHYDRAMINE HCL 25 MG PO CAPS
25.0000 mg | ORAL_CAPSULE | ORAL | Status: DC | PRN
  Administered 2012-01-03: 25 mg via ORAL
  Filled 2012-01-03: qty 1

## 2012-01-03 MED ORDER — LANOLIN HYDROUS EX OINT: 1.0000 "application " | TOPICAL_OINTMENT | CUTANEOUS | Status: DC | PRN

## 2012-01-03 MED ORDER — ZOLPIDEM TARTRATE 5 MG PO TABS: 5.0000 mg | ORAL_TABLET | Freq: Every evening | ORAL | Status: DC | PRN

## 2012-01-03 MED ORDER — DIPHENHYDRAMINE HCL 50 MG/ML IJ SOLN: 25.0000 mg | INTRAMUSCULAR | Status: DC | PRN

## 2012-01-03 MED ORDER — OXYCODONE-ACETAMINOPHEN 5-325 MG PO TABS
1.0000 | ORAL_TABLET | ORAL | Status: DC | PRN
  Administered 2012-01-04 (×2): 1 via ORAL
  Filled 2012-01-03 (×2): qty 1

## 2012-01-03 MED ORDER — SIMETHICONE 80 MG PO CHEW: 80.0000 mg | CHEWABLE_TABLET | ORAL | Status: DC | PRN

## 2012-01-03 MED ORDER — NALOXONE HCL 0.4 MG/ML IJ SOLN: 0.4000 mg | INTRAMUSCULAR | Status: DC | PRN

## 2012-01-03 MED ORDER — OXYTOCIN 40 UNITS IN LACTATED RINGERS INFUSION - SIMPLE MED: 62.5000 mL/h | INTRAVENOUS | Status: AC

## 2012-01-03 NOTE — Progress Notes (Signed)
UR chart review completed.  

## 2012-01-03 NOTE — Anesthesia Postprocedure Evaluation (Signed)
  Anesthesia Post-op Note  Patient: [name]  Procedure(s) Performed: Procedure(s) (LRB): CESAREAN SECTION (N/A)  Patient Location: Mother/Baby  Anesthesia Type: Epidural  Level of Consciousness: awake, alert  and oriented  Airway and Oxygen Therapy: Patient Spontanous Breathing  Post-op Pain: mild  Post-op Assessment: Patient's Cardiovascular Status Stable, Respiratory Function Stable, Patent Airway, No signs of Nausea or vomiting and Pain level controlled  Post-op Vital Signs: stable  Complications: No apparent anesthesia complications

## 2012-01-03 NOTE — Addendum Note (Signed)
Addendum  created 01/03/12 0954 by Lincoln Brigham, CRNA   Modules edited:Notes Section

## 2012-01-03 NOTE — Progress Notes (Addendum)
Subjective: POD# 1 Information for the patient's newborn:  Ivey, Nembhard [[id number]]  female   Reports feeling tired but well. Feeding: breast, reports no difficulties w/ latch. Patient reports tolerating PO.  Breast symptoms: none Pain controlled withprescription NSAID's including ketorolac (Toradol) Denies HA/SOB/C/P/N/V/dizziness. Flatus absent. She reports vaginal bleeding as normal, without clots.  She stood at [facility] x 1, foley cath in place.   Objective:   VS:  Filed Vitals:   01/03/12 0115 01/03/12 0230 01/03/12 0430 01/03/12 0630  BP: 117/90 111/82 106/73 113/78  Pulse: 70 92 81 75  Temp: 98 F (36.7 C) 97.4 F (36.3 C) 97.8 F (36.6 C) 98 F (36.7 C)  TempSrc:  Oral Oral Oral  Resp: 20 18 18 18   Height:      Weight:      SpO2: 97% 97% 95% 95%     Intake/Output Summary (Last 24 hours) at 01/03/12 1107 Last data filed at 01/03/12 0630  Gross per 24 hour  Intake 5582.3 ml  Output   3325 ml  Net 2257.3 ml        Basename 01/03/12 0543 01/02/12 0540  WBC 14.1* 12.8*  HGB 9.3* 9.1*  HCT 28.1* 27.0*  PLT 211 226     Blood type: --/--/O POS (07/01 1935)  Rubella: Immune (04/16 0000)     Physical Exam:  General: alert, cooperative and no distress CV: Regular rate and rhythm Resp: clear Abdomen: soft, nontender, normal bowel sounds, moderate gas distention Incision: dressing to LST, small soil Uterine Fundus: firm, below umbilicus, nontender Lochia: minimal Ext: Homans sign is negative, no sign of DVT and no edema, redness or tenderness in the calves or thighs      Assessment/Plan: 21 y.o.   POD# 1.  s/p Cesarean Delivery.  Indications: failure to progress and fetal intolerance to labor                Principal Problem:  *Postpartum care following cesarean delivery (7/3) Active Problems:  Hypothyroidism complicating pregnancy / delivered  On Synthroid, plan 2 wks F/U with endo after DC  Pyelonephritis complicating pregnancy - 39wk /  delivered  Afebrile for past 20 hours, last Tylenol dose 13 hours ago.   C Diff culture negative  Will continue IV ABX (Clinda and Gent) until afebrile and off acetaminophen x 24 hours.  Anemia (chronic, w/ ? Hx of hemolytic anemia)  S/P 1 unit transfusion, stable hgb post-op    Doing well, stable post-op.               Advance diet as tolerated, warm PO fluids D/C foley  Encouraged ambulation today to promote gas motility Routine post-op care  Lewayne Pauley 01/03/2012, 11:07 AM

## 2012-01-04 ENCOUNTER — Encounter (HOSPITAL_COMMUNITY): Payer: Self-pay | Admitting: Obstetrics and Gynecology

## 2012-01-04 LAB — TYPE AND SCREEN
ABO/RH(D): O POS
Antibody Screen: NEGATIVE
Unit division: 0
Unit division: 0
Unit division: 0
Unit division: 0

## 2012-01-04 LAB — CULTURE, BLOOD (ROUTINE X 2): Culture: NO GROWTH

## 2012-01-04 LAB — RAPID STREP SCREEN (MED CTR MEBANE ONLY): Streptococcus, Group A Screen (Direct): NEGATIVE

## 2012-01-04 MED ORDER — GENTAMICIN SULFATE 40 MG/ML IJ SOLN
130.0000 mg | INTRAMUSCULAR | Status: DC
  Administered 2012-01-04: 130 mg via INTRAMUSCULAR
  Filled 2012-01-04 (×3): qty 4

## 2012-01-04 MED ORDER — GUAIFENESIN ER 600 MG PO TB12
600.0000 mg | ORAL_TABLET | Freq: Two times a day (BID) | ORAL | Status: DC
  Filled 2012-01-04: qty 1

## 2012-01-04 NOTE — Progress Notes (Signed)
Patient has ambulated halls x2. Patient has had a BM today. Patient is drinking , and eating better today. Patient is afebrile and is urinating adequately. Patient encouraged to feed infant every 3 hours. She verbalized understanding. Lactation was in to work with patient.

## 2012-01-04 NOTE — Progress Notes (Signed)
PT HAS VITILIGO.

## 2012-01-04 NOTE — Progress Notes (Signed)
Patient ID: [name], female   DOB: 25-Apr-1991, 21 y.o.   MRN: [id number] POD # 2  Subjective: Pt reports feeling "better", but poor pain control at present as she has declined percocet until this am.  Also c/o significant sore throat and "feels like something is caught in my throat" Tolerating po/Voiding without problems/ No n/v/Flatus pos and has had BM Activity: out of bed and ambulate Bleeding is light Newborn info:  Information for the patient's newborn:  Calvina, Liptak [[id number]]  female Feeding: breast   Objective: VS: Blood pressure 109/77, pulse 84, temperature 99 F (37.2 C), temperature source Oral, resp. rate 18.    Physical Exam:  General: alert, cooperative and no distress HEENT: mild lymphedema; mod erythema and mild edema to pharynx CV: Regular rate and rhythm Resp: clear Abdomen: soft, nontender, normal bowel sounds Uterine Fundus: firm, below umbilicus, nontender Perineum: not inspected Lochia: minimal Ext: edema trace and Homans sign is negative, no sign of DVT    A/P: POD # 2/ G4P1021/ S/P C/Section d/t failure to progress Hx pyelo; per ID, ok to d/c gent/clinda...afebrile >24hrs ABL and chronic anemia; stable s/p 1u PRBC's; cont Fe supplement Hx hypothyroidism; stable Pharyngitis; will send rapid strep screen, urged warm salt water gargles and lozenges Improve pain control and increase ambulation Continue routine post op orders   Signed: Demetrius Revel, MSN, [facility] 01/04/2012, 9:59 AM

## 2012-01-04 NOTE — Progress Notes (Signed)
PT COMPLAINED OF SORE THROAT. RAPID STREP DONE. JULIE FISHER NOTIFIED OF PT'S STATUS AND TEMPERATURE OF 98.3.

## 2012-01-04 NOTE — Progress Notes (Signed)
Patient ID: [name], female   DOB: [birth year], 21 y.o.   MRN: [id number]    [facility]    Date of Admission:  12/28/2011   Total days of antibiotics 8          Principal Problem:  *Postpartum care following cesarean delivery (7/3) Active Problems:  Hypothyroidism complicating pregnancy / delivered  Pyelonephritis complicating pregnancy - 39wk / delivered  Maternal anemia complicating pregnancy, childbirth, or the puerperium      . clindamycin (CLEOCIN) IV  900 mg Intravenous Q8H  . gentamicin  130 mg Intravenous Q18H  . ibuprofen  600 mg Oral Q6H  . iron polysaccharides  150 mg Oral Daily  . levothyroxine  175 mcg Oral q morning - 10a  . prenatal multivitamin  1 tablet Oral Daily  . prenatal multivitamin  1 tablet Oral Daily  . scopolamine  1 patch Transdermal Once  . senna-docusate  2 tablet Oral QHS  . simethicone  80 mg Oral TID PC & HS  . sodium chloride      . TDaP  0.5 mL Intramuscular Once    Subjective: C/O abdominal pain. No dysuria. Voiding well.  Objective: Temp:  [97.8 F (36.6 C)-99 F (37.2 C)] 99 F (37.2 C) (07/05 0656) Pulse Rate:  [20-100] 84  (07/05 0656) Resp:  [18-20] 18  (07/05 0656) BP: (95-118)/(63-77) 109/77 mmHg (07/05 0656) SpO2:  [95 %-99 %] 99 % (07/05 0656)  General: alert, comfortable Skin: vitiligo Lungs: clear Cor: reg S1 and S2 without murmur Abdomen: no CVA tenderness   Lab Results Lab Results  Component Value Date   WBC 14.1* 01/03/2012   HGB 9.3* 01/03/2012   HCT 28.1* 01/03/2012   MCV 87.0 01/03/2012   PLT 211 01/03/2012    Lab Results  Component Value Date   CREATININE 0.76 12/31/2011   BUN 6 12/31/2011   NA 135 12/31/2011   K 3.6 12/31/2011   CL 103 12/31/2011   CO2 21 12/31/2011    Lab Results  Component Value Date   ALT <5 12/31/2011   AST 7 12/31/2011   ALKPHOS 232* 12/31/2011   BILITOT 0.4 12/31/2011      Microbiology: Recent Results (from the past 240 hour(s))  CULTURE, BLOOD (ROUTINE X  2)     Status: Normal   Collection Time   12/28/11  8:55 PM      Component Value Range Status Comment   Specimen Description BLOOD RIGHT ARM   Final    Special Requests BOTTLES DRAWN AEROBIC AND ANAEROBIC   Final    Culture  Setup Time 12/29/2011 01:30   Final    Culture NO GROWTH 5 DAYS   Final    Report Status 01/04/2012 FINAL   Final   CULTURE, BLOOD (ROUTINE X 2)     Status: Normal   Collection Time   12/28/11  9:15 PM      Component Value Range Status Comment   Specimen Description BLOOD RIGHT ARM   Final    Special Requests BOTTLES DRAWN AEROBIC AND ANAEROBIC   Final    Culture  Setup Time 12/29/2011 01:30   Final    Culture NO GROWTH 5 DAYS   Final    Report Status 01/04/2012 FINAL   Final   URINE CULTURE     Status: Normal   Collection Time   12/28/11 11:30 PM      Component Value Range Status Comment   Specimen Description URINE,  CLEAN CATCH   Final    Special Requests azithromycin   Final    Culture  Setup Time 12/29/2011 03:55   Final    Colony Count 25,000 COLONIES/ML   Final    Culture ESCHERICHIA COLI   Final    Report Status 12/31/2011 FINAL   Final    Organism ID, Bacteria ESCHERICHIA COLI   Final   CLOSTRIDIUM DIFFICILE BY PCR     Status: Normal   Collection Time   01/02/12  8:43 PM      Component Value Range Status Comment   C difficile by pcr NEGATIVE  NEGATIVE Final   ANAEROBIC CULTURE     Status: Normal (Preliminary result)   Collection Time   01/02/12  9:45 PM      Component Value Range Status Comment   Specimen Description PLACENTA   Final    Special Requests NONE   Final    Gram Stain     Final    Value: RARE WBC PRESENT,BOTH PMN AND MONONUCLEAR     NO SQUAMOUS EPITHELIAL CELLS SEEN     NO ORGANISMS SEEN   Culture PENDING   Incomplete    Report Status PENDING   Incomplete   WOUND CULTURE     Status: Normal (Preliminary result)   Collection Time   01/02/12  9:45 PM      Component Value Range Status Comment   Specimen Description PLACENTA    Final    Special Requests NONE   Final    Gram Stain     Final    Value: RARE WBC PRESENT,BOTH PMN AND MONONUCLEAR     NO SQUAMOUS EPITHELIAL CELLS SEEN     NO ORGANISMS SEEN   Culture NO GROWTH 1 DAY   Final    Report Status PENDING   Incomplete     Assessment: She remains afebrile off of acetamenaphen. I agree with plans to discontinue antibiotics today.  Plan: 1. Recommend discontinue antibiotics 2. Please call if we can assist further  Cliffton Asters, MD [facility] [phone] pager   [phone] cell 01/04/2012, 9:29 AM

## 2012-01-04 NOTE — Progress Notes (Signed)
Hx pyelo, was on IV [place] and Clinda prior to C/S. No abx given for 24 hrs post-op as planned.  Afebrile for past 24 hours.  Consulted Dr. Orvan Falconer (ID) and Dr. Cherly Hensen. Patient w/ poor compliance hx and recurrent infx. At risk for pyelo. Not candidate for quinolone w/ BFing and severe PCN allergy therefore no cephalosporins. No good oral option for continuing ABX course for a least 10 days.   Plan give Gentamycin 130 mg IM Q 18 hrs (based on previous P/T) x 3 doses prior to D/C.   PAUL,DANIELA  01/04/2012 1:07 PM

## 2012-01-05 LAB — WOUND CULTURE

## 2012-01-05 LAB — PARVOVIRUS B19 ANTIBODY, IGG AND IGM: Parovirus B19 IgG Abs: 0.2 index (ref <0.9)

## 2012-01-05 MED ORDER — OXYCODONE-ACETAMINOPHEN 5-325 MG PO TABS: 1.0000 | ORAL_TABLET | ORAL | Status: AC | PRN

## 2012-01-05 MED ORDER — CIPROFLOXACIN HCL 500 MG PO TABS
500.0000 mg | ORAL_TABLET | Freq: Two times a day (BID) | ORAL | Status: DC
  Administered 2012-01-05: 500 mg via ORAL
  Filled 2012-01-05: qty 1

## 2012-01-05 MED ORDER — POLYSACCHARIDE IRON COMPLEX 150 MG PO CAPS: 150.0000 mg | ORAL_CAPSULE | Freq: Every day | ORAL | Status: DC

## 2012-01-05 MED ORDER — IBUPROFEN 600 MG PO TABS: 600.0000 mg | ORAL_TABLET | Freq: Four times a day (QID) | ORAL | Status: AC | PRN

## 2012-01-05 MED ORDER — CIPROFLOXACIN HCL 500 MG PO TABS
500.0000 mg | ORAL_TABLET | Freq: Two times a day (BID) | ORAL | Status: AC
Start: 2012-01-05 — End: 2012-01-15

## 2012-01-05 NOTE — Progress Notes (Signed)
Subjective: POD# 3 Information for the patient's newborn:  Zian, Mohamed [[id number]]  female    Reports feeling well, up in room ad lib. Feeding: breast, reports no difficulties w/ latch. Patient reports tolerating PO.  Breast symptoms: none Pain controlled with Motrin and rare Percocet. Denies HA/SOB/C/P/N/V/dizziness. Flatus present, +BM. She reports vaginal bleeding as normal, without clots.  Voiding well.    Objective:   VS:  Filed Vitals:   01/04/12 0656 01/04/12 1359 01/04/12 2100 01/05/12 0600  BP: 109/77 118/78 98/61 95/57   Pulse: 84 76 72 71  Temp: 99 F (37.2 C) 98.7 F (37.1 C) 98 F (36.7 C) 98.3 F (36.8 C)  TempSrc: Oral Oral Oral Oral  Resp: 18 18 18 16   Height:      Weight:      SpO2: 99%  98%       Intake/Output Summary (Last 24 hours) at 01/05/12 1001 Last data filed at 01/04/12 1717  Gross per 24 hour  Intake      0 ml  Output    501 ml  Net   -501 ml         Basename 01/03/12 0543  WBC 14.1*  HGB 9.3*  HCT 28.1*  PLT 211     Blood type: --/--/O POS (07/01 1935)  Rubella: Immune (04/16 0000)     Physical Exam:  General: alert, cooperative and no distress CV: Regular rate and rhythm Resp: clear Abdomen: soft, nontender, normal bowel sounds, moderate gas distention Incision: dressing to LST, small soil Uterine Fundus: firm, below umbilicus, nontender Lochia: minimal Ext: edema trace and Homans sign is negative, no sign of DVT      Assessment/Plan: 21 y.o.   POD# 3.  s/p Cesarean Delivery.  Indications: failure to progress and fetal intolerance to labor                Principal Problem:  *Postpartum care following cesarean delivery (7/3) Active Problems:  Hypothyroidism complicating pregnancy / delivered  On Synthroid, plan 2 wks F/U with endo after DC  Pyelonephritis complicating pregnancy - 39wk / delivered  S/p one dose Gent 130 mg IM, afebrile for past 48 hours  Changed to Cipro 500 PO BID x 10 doses this am  after consult last night w/ Dr. Orvan Falconer, peds on call and pharmacist. Plan continue PO ABX x 5 days to complete 14 total days on ABX given patient's hx of recurrent infections and non-compliance  Plan TOC at [facility] in 2 weeks  Anemia (chronic, w/ ? Hx of hemolytic anemia)  S/P 1 unit transfusion, stable hgb post-op  Oral Fe supplement x 4-6 wks    Doing well, stable post-op.    Staples remove prior to discharge and replace with benzoin and steri strips.  Routine post-op care DC home w/ instructions  PAUL,DANIELA 01/05/2012, 10:01 AM

## 2012-01-05 NOTE — Discharge Summary (Signed)
POSTOPERATIVE DISCHARGE SUMMARY:  Patient ID: [name] MRN: [id number] DOB/AGE: 22-Dec-1990 21 y.o.  Admit date: 12/28/2011 Discharge date:  01/05/2012   Admission Diagnoses:  38 5/7 non-reassuring FHR with tachycardia / decreased fetal movement / hypothyroidism   Discharge Diagnoses:   Term Pregnancy-delivered and status post cesarean section for arrest of descent and fetal intolerance to labor Hypothyroidism, delivered Chronic and acute blood loss anemia  Prenatal history: [id number]   EDC : 01/06/2012, by Other Basis  Prenatal care at [facility] since first trimester Primary OB provider [name] CNM / Endocrinology - Dr Althemier  Prenatal course complicated by uncontrolled hypothyroidism (control not established until ~[redacted] weeks gestation due to non-compliance with medication) / anemia / repeat chlamydial infection / poor dentition with multiple cavities (seeing dentist in pregnancy) / threatened preterm labor / hx short bowel syndrome - chronic constipation (neonatal bowel resection) / undetermined paternity / social and financial stressors / vitiligo.   Prenatal Labs:  ABO, Rh: O positive  Antibody: negative  Rubella: Immune  RPR: NR / NR / NR  HBsAg: Negative  HIV: NR / NR  1 hr Glucola : 87  GC: Neg / Neg / Neg  CHL: Neg / Pos / Neg / Pos / Neg / Pos  GBS: + bacturia in first trimester (clindamycin sensitive)  36 weeks negative - treat intrapartum per CDC guidelines for GBS bacturia   Medical / Surgical History :  Past medical history:  Past Medical History  Diagnosis Date  . Thyroid disease   . Hypothyroidism   . Tachycardia   . Headache   . Preterm contractions 12/07/2011  . Maternal anemia complicating pregnancy, childbirth, or the puerperium 01/03/2012    Past surgical history:  Past Surgical History  Procedure Date  . Liver patched   . Intestines removed   . Knee surgery   . Cesarean section 01/02/2012    Procedure: CESAREAN  SECTION;  Surgeon: Lenoard Aden, MD;  Location: [facility];  Service: Gynecology;  Laterality: N/A;    Family History:  Family History  Problem Relation Age of Onset  . Diabetes Father   . Hypertension Father   . Other Neg Hx     Social History:  reports that she has never smoked. She has never used smokeless tobacco. She reports that she does not drink alcohol or use illicit drugs.   Allergies: Penicillins and Morphine and related    Current Medications at time of admission:  Prescriptions prior to admission  Medication Sig Dispense Refill  . acetaminophen (TYLENOL) 500 MG tablet Take 500 mg by mouth every 4 (four) hours as needed. To reduce fever.      . levothyroxine (SYNTHROID, LEVOTHROID) 175 MCG tablet Take 175 mcg by mouth daily.      . Prenatal Vit-Fe Fumarate-FA (PRENATAL MULTIVITAMIN) TABS Take 1 tablet by mouth daily.      Marland Kitchen DISCONTD: azithromycin (ZITHROMAX) 250 MG tablet Take 250 mg by mouth daily. Pt takes 2 tablets on day 1 and 1 tablet for 4 days. Pt is on day 3 of a 5 day course of medication.      Marland Kitchen DISCONTD: zolpidem (AMBIEN) 10 MG tablet Take 0.5 tablets (5 mg total) by mouth at bedtime as needed for sleep.  15 tablet  0      Admit labs:   12/28/2011 21:30  Sodium 131 (L)  Potassium 3.1 (L)  Chloride 100  CO2 17 (L)  BUN 3 (L)  Creat 0.56  Calcium 8.8  GFR calc non Af Amer >90  GFR calc Af Amer >90  Glucose 107 (H)  Alkaline Phosphatase 286 (H)  Albumin 2.0 (L)  AST 10  ALT 5  Total Protein 6.8  Total Bilirubin 0.9  WBC 14.5 (H)  RBC 3.36 (L)  Hemoglobin 9.9 (L)  HCT 29.4 (L)  MCV 87.5  MCH 29.5  MCHC 33.7  RDW 14.9  Platelets 251    Antepartum / Intrapartum Course: Patient was admitted to antepartum unit with fever and fetal tachycardia, labs and clinical presentation suspicious of pyelonephritis. She was started on intravenous antibiotics - Gentamycin and Clindamycin, and was given Tylenol for fever (T-max 101.8), and her fever curve  improved over the next 5 days. Infectious Disease consult done. Urine cultures grew e-coli sensitive to Gentamycin after 2 days. Fetal heart rate decreased to normal baseline over the course of the next few days, with occasional spontaneous decelerations, but mostly reassuring with moderate variability and accelerations present. Biophysical profiles were repeated and remained reassuring. Patient was counseled for and received one unit or red blood cells transfusion prophylactic in anticipation of complicated and possible surgical delivery in setting of chronic anemia (interval hemoglobin 8.5). On hospital day 5 patient was afebrile for 24 hours and decision was made to induce labor after consult with Maternal Fetal Medicine physician. Labor induction with cervical balloon and Pitocin. Patient progressed well in labor to 9 cm dilation. She received an epidural for pain control and an amnioinfusion for repetitive fetal heart tracing variable decelerations. She remained 9 cm dilation and 0 station for 3 hours, repeated attempt to reduce cervix were unsuccessful and fetal heart tracing became category III with deep variable decelerations and late returns to baseline. Decision was made to proceed with cesarean section and patient was consented accordingly. General surgeon was notified and on stand-by for history of extensive bowel surgery.   Procedures: Cesarean section delivery of female newborn by Dr Billy Coast  See operative report for further details  Postoperative / postpartum course: uneventful Oral Iron supplementation started and to continue for 4-6 weeks postpartum. Antibiotics were changed to Gentamycin IM after IV site discontinued prematurely, then after consult with pharmacist and pediatric provider, Cipro PO was given to complete a total of 14 days antibiotic course for pyelonephritis. Patient to follow up in office 2 weeks for urine test-of-cure.  Physical Exam:  VSS: Blood pressure 95/57, pulse 71,  temperature 98.3 F (36.8 C), temperature source Oral, resp. rate 16, height 5' (1.524 m), weight 51.71 kg (114 lb), SpO2 98.00%, unknown if currently breastfeeding.   LABS:  Basename 01/03/12 0543  WBC 14.1*  HGB 9.3*  HCT 28.1*  PLT 211    I&O: I/O last 3 completed shifts: In: 900 [P.O.:600; Other:300] Out: 1501 [Urine:1500; Stool:1]      Incision:  approximated with staples / no erythema / no ecchymosis / no drainage Staples:  removed prior to discharge and replaced with benzoin and steri strips.   Discharge Instructions:  Discharged Condition: good Activity: pelvic rest and weight lifting and driving restrictions x 2 weeks. Diet: routine Medications:  Medication List  As of 01/05/2012 10:19 AM   STOP taking these medications         azithromycin 250 MG tablet      zolpidem 10 MG tablet         TAKE these medications         acetaminophen 500 MG tablet   Commonly known as: TYLENOL   Take  500 mg by mouth every 4 (four) hours as needed. To reduce fever.      ciprofloxacin 500 MG tablet   Commonly known as: CIPRO   Take 1 tablet (500 mg total) by mouth 2 (two) times daily.      ibuprofen 600 MG tablet   Commonly known as: ADVIL,MOTRIN   Take 1 tablet (600 mg total) by mouth every 6 (six) hours as needed.      iron polysaccharides 150 MG capsule   Commonly known as: NIFEREX   Take 1 capsule (150 mg total) by mouth daily.      levothyroxine 175 MCG tablet   Commonly known as: SYNTHROID, LEVOTHROID   Take 175 mcg by mouth daily.      oxyCODONE-acetaminophen 5-325 MG per tablet   Commonly known as: PERCOCET   Take 1-2 tablets by mouth every 3 (three) hours as needed (moderate - severe pain).      prenatal multivitamin Tabs   Take 1 tablet by mouth daily.           Condition: stable Postpartum Instructions: refer to practice specific booklet Discharge to: home Disposition: 01-Home or Self Care Follow up :  Follow-up Information    Follow up with  [name], CNM. Schedule an appointment as soon as possible for a visit in 2 weeks.   Contact information:   [address] [place] [address] [phone]       Follow up with Junious Silk, MD. Schedule an appointment as soon as possible for a visit in 2 weeks. (need follow up to adjust thyroid medication)    Contact information:   [address] [place] [address] [phone]           Signed: Arlan Organ 01/05/2012, 10:19 AM

## 2012-01-08 LAB — ANAEROBIC CULTURE

## 2012-04-14 ENCOUNTER — Emergency Department (HOSPITAL_COMMUNITY): Payer: 59

## 2012-04-14 ENCOUNTER — Encounter (HOSPITAL_COMMUNITY): Payer: Self-pay | Admitting: Emergency Medicine

## 2012-04-14 ENCOUNTER — Emergency Department (HOSPITAL_COMMUNITY)
Admission: EM | Admit: 2012-04-14 | Discharge: 2012-04-15 | Disposition: A | Discharge status: Home / Self Care | Payer: 59 | Attending: Emergency Medicine | Admitting: Emergency Medicine

## 2012-04-14 DIAGNOSIS — E079 Disorder of thyroid, unspecified: Secondary | ICD-10-CM | POA: Insufficient documentation

## 2012-04-14 DIAGNOSIS — R209 Unspecified disturbances of skin sensation: Secondary | ICD-10-CM | POA: Insufficient documentation

## 2012-04-14 DIAGNOSIS — R51 Headache: Secondary | ICD-10-CM | POA: Insufficient documentation

## 2012-04-14 LAB — BASIC METABOLIC PANEL
CO2: 22 mEq/L (ref 19–32)
Calcium: 9.5 mg/dL (ref 8.4–10.5)
GFR calc non Af Amer: >90 mL/min (ref >90)
Glucose, Bld: 100 mg/dL — ABNORMAL HIGH (ref 70–99)
Potassium: 3 mEq/L — ABNORMAL LOW (ref 3.5–5.1)
Sodium: 143 mEq/L (ref 135–145)

## 2012-04-14 LAB — CBC WITH DIFFERENTIAL/PLATELET
Basophils Absolute: 0 10*3/uL (ref 0.0–0.1)
Eosinophils Relative: 1 % (ref 0–5)
Lymphocytes Relative: 37 % (ref 12–46)
Lymphs Abs: 2 10*3/uL (ref 0.7–4.0)
MCV: 85.8 fL (ref 78.0–100.0)
Neutrophils Relative %: 58 % (ref 43–77)
Platelets: 266 10*3/uL (ref 150–400)
RBC: 4.23 MIL/uL (ref 3.87–5.11)
RDW: 13.8 % (ref 11.5–15.5)
WBC: 5.4 10*3/uL (ref 4.0–10.5)

## 2012-04-14 NOTE — ED Provider Notes (Signed)
History     CSN: [id number]  Arrival date & time 04/14/12  1609   First MD Initiated Contact with Patient 04/14/12 2049      Chief Complaint  Patient presents with  . Headache    HPI  21 year old female past history of hypothyroidism, prior headaches, recent C-section 3 months ago presents with 2 weeks history of worsening right sided headache. Patient states she suffers from chronic nosebleeds. And has had limited sleep over the last 3 months. Headache is described as a throbbing pain. Is worsened by bright lights. It is improved after sleep. Has also noted some nausea. Ibuprofen has mildly improved symptoms. The patient is breast -feeding. The patient also reports intermittent right upper and lower extremities numbness. She has had a total of 2 episodes of the last 2 weeks episodes last less than 30 seconds.   Past Medical History  Diagnosis Date  . Thyroid disease   . Hypothyroidism   . Tachycardia   . Headache   . Preterm contractions 12/07/2011  . Maternal anemia complicating pregnancy, childbirth, or the puerperium 01/03/2012    Past Surgical History  Procedure Date  . Liver patched   . Intestines removed   . Knee surgery   . Cesarean section 01/02/2012    Procedure: CESAREAN SECTION;  Surgeon: Lenoard Aden, MD;  Location: [facility];  Service: Gynecology;  Laterality: N/A;    Family History  Problem Relation Age of Onset  . Diabetes Father   . Hypertension Father   . Other Neg Hx     History  Substance Use Topics  . Smoking status: Never Smoker   . Smokeless tobacco: Never Used  . Alcohol Use: No    OB History    Grav Para Term Preterm Abortions TAB SAB Ect Mult Living   4 1 1  0 2 1 0 1 0 1      Review of Systems  Constitutional: Negative for fever, chills, activity change and appetite change.  HENT: Negative for ear pain, congestion, rhinorrhea and neck pain.   Eyes: Negative for pain.  Respiratory: Negative for cough and shortness of breath.     Cardiovascular: Negative for chest pain and palpitations.  Gastrointestinal: Negative for nausea, vomiting and abdominal pain.  Genitourinary: Negative for dysuria, difficulty urinating and pelvic pain.  Musculoskeletal: Negative for back pain.  Skin: Negative for rash and wound.  Neurological: Positive for numbness ( Intermittent right upper and lower extremity.) and headaches. Negative for dizziness, tremors, seizures, syncope, facial asymmetry, speech difficulty, weakness and light-headedness.  Psychiatric/Behavioral: Negative for behavioral problems, confusion and agitation.    Allergies  Penicillins and Morphine and related  Home Medications   Current Outpatient Rx  Name Route Sig Dispense Refill  . ACETAMINOPHEN 500 MG PO TABS Oral Take 500 mg by mouth every 4 (four) hours as needed. To reduce fever.    Marland Kitchen POLYSACCHARIDE IRON COMPLEX 150 MG PO CAPS Oral Take 1 capsule (150 mg total) by mouth daily. 30 capsule 3  . LEVOTHYROXINE SODIUM 175 MCG PO TABS Oral Take 175 mcg by mouth daily.    Marland Kitchen PRENATAL MULTIVITAMIN CH Oral Take 1 tablet by mouth daily.      BP 133/92  Pulse 95  Temp 98.4 F (36.9 C) (Oral)  Resp 18  SpO2 98%  Breastfeeding? Unknown  Physical Exam  Constitutional: She is oriented to person, place, and time. She appears well-developed and well-nourished. No distress.  HENT:  Head: Normocephalic and atraumatic.  Nose: Nose  normal.  Mouth/Throat: Oropharynx is clear and moist.  Eyes: EOM are normal. Pupils are equal, round, and reactive to light.       Funduscopic exam unremarkable. No papilledema or hemorrhage.  Neck: Normal range of motion. Neck supple. No tracheal deviation present.  Cardiovascular: Normal rate, regular rhythm, normal heart sounds and intact distal pulses.   Pulmonary/Chest: Effort normal and breath sounds normal. She has no rales.  Abdominal: Soft. Bowel sounds are normal. She exhibits no distension. There is no tenderness. There is no  rebound and no guarding.  Musculoskeletal: Normal range of motion. She exhibits no tenderness.  Neurological: She is alert and oriented to person, place, and time. She has normal strength. No cranial nerve deficit or sensory deficit. She displays a negative Romberg sign. Coordination normal. GCS eye subscore is 4. GCS verbal subscore is 5. GCS motor subscore is 6.  Reflex Scores:      Patellar reflexes are 2+ on the right side and 2+ on the left side.      Achilles reflexes are 2+ on the right side and 2+ on the left side. Skin: Skin is warm and dry. No rash noted.  Psychiatric: She has a normal mood and affect. Her behavior is normal.    ED Course  Procedures (including critical care time)  Results for orders placed during the hospital encounter of 04/14/12  CBC WITH DIFFERENTIAL      Component Value Range   WBC 5.4  4.0 - 10.5 K/uL   RBC 4.23  3.87 - 5.11 MIL/uL   Hemoglobin 12.2  12.0 - 15.0 g/dL   HCT 16.1  09.6 - 04.5 %   MCV 85.8  78.0 - 100.0 fL   MCH 28.8  26.0 - 34.0 pg   MCHC 33.6  30.0 - 36.0 g/dL   RDW 40.9  81.1 - 91.4 %   Platelets 266  150 - 400 K/uL   Neutrophils Relative 58  43 - 77 %   Neutro Abs 3.1  1.7 - 7.7 K/uL   Lymphocytes Relative 37  12 - 46 %   Lymphs Abs 2.0  0.7 - 4.0 K/uL   Monocytes Relative 4  3 - 12 %   Monocytes Absolute 0.2  0.1 - 1.0 K/uL   Eosinophils Relative 1  0 - 5 %   Eosinophils Absolute 0.0  0.0 - 0.7 K/uL   Basophils Relative 0  0 - 1 %   Basophils Absolute 0.0  0.0 - 0.1 K/uL  BASIC METABOLIC PANEL      Component Value Range   Sodium 143  135 - 145 mEq/L   Potassium 3.0 (*) 3.5 - 5.1 mEq/L   Chloride 110  96 - 112 mEq/L   CO2 22  19 - 32 mEq/L   Glucose, Bld 100 (*) 70 - 99 mg/dL   BUN 8  6 - 23 mg/dL   Creatinine, Ser 7.82  0.50 - 1.10 mg/dL   Calcium 9.5  8.4 - 95.6 mg/dL   GFR calc non Af Amer >90  >90 mL/min   GFR calc Af Amer >90  >90 mL/min    CT Head Wo Contrast (Final result)   Result time:04/14/12 2303    Final  result by Rad Results In Interface (04/14/12 23:03:16)    Narrative:   *RADIOLOGY REPORT*  Clinical Data: Left-sided temporal headache for 3 days. Light sensitivity.  CT HEAD WITHOUT CONTRAST  Technique: Contiguous axial images were obtained from the base of the skull  through the vertex without contrast.  Comparison: None.  Findings: Bone windows demonstrate clear paranasal sinuses and mastoid air cells. Aerated petrous apices.  Soft tissue windows demonstrate no mass lesion, hemorrhage, hydrocephalus, acute infarct, intra-axial, or extra-axial fluid collection.  IMPRESSION: Normal head CT.   Original Report Authenticated By: Consuello Bossier, M.D.          1. Headache       MDM    21 year old female in no acute distress, afebrile, vital signs stable, non toxic appearing who presents with headache. No fever, white blood cell count 5.4, no neck stiffness, no evidence of meningismus doubt meningitis. CT with no acute intracranial abnormality. Neurologic exam nonfocal. Patient also reports chronic sleep deprivation. Sleeping 4-5 hours a day over the last couple weeks due to infant at home. Exam demonstrated Boggy nasal turbinates. Headache transiently improved after intranasal aerosolized lidocaine.  Oral Compazine and Benadryl provided mild improvement in headache. Ordered normal saline bolus and 2 g IV magnesium. Case discussed with Dr. Sunnie Nielsen at 1:27 AM. Will plan to reassess patient after magnesium infusion.        Nadara Mustard, MD 04/15/12 0130

## 2012-04-14 NOTE — ED Notes (Signed)
Pt c/o left sided temporal HA x 3 days after having nose bleed; pt sts some light sensitivity; pt denies nausea

## 2012-04-15 LAB — TSH: TSH: 0.157 u[IU]/mL — ABNORMAL LOW (ref 0.350–4.500)

## 2012-04-15 MED ORDER — PROMETHAZINE HCL 25 MG PO TABS: 25.0000 mg | ORAL_TABLET | Freq: Four times a day (QID) | ORAL | Status: DC | PRN

## 2012-04-15 MED ORDER — PROCHLORPERAZINE MALEATE 10 MG PO TABS
10.0000 mg | ORAL_TABLET | Freq: Once | ORAL | Status: AC
  Administered 2012-04-15: 10 mg via ORAL
  Filled 2012-04-15: qty 1

## 2012-04-15 MED ORDER — POTASSIUM CHLORIDE CRYS ER 20 MEQ PO TBCR
40.0000 meq | EXTENDED_RELEASE_TABLET | Freq: Once | ORAL | Status: AC
  Administered 2012-04-15: 40 meq via ORAL
  Filled 2012-04-15 (×2): qty 2

## 2012-04-15 MED ORDER — DIPHENHYDRAMINE HCL 25 MG PO CAPS
25.0000 mg | ORAL_CAPSULE | Freq: Once | ORAL | Status: AC
  Administered 2012-04-15: 25 mg via ORAL
  Filled 2012-04-15 (×2): qty 1

## 2012-04-15 MED ORDER — SODIUM CHLORIDE 0.9 % IV BOLUS (SEPSIS)
1000.0000 mL | Freq: Once | INTRAVENOUS | Status: AC
  Administered 2012-04-15: 1000 mL via INTRAVENOUS

## 2012-04-15 MED ORDER — NAPROXEN 500 MG PO TABS: 500.0000 mg | ORAL_TABLET | Freq: Two times a day (BID) | ORAL | Status: DC

## 2012-04-15 MED ORDER — DIPHENHYDRAMINE HCL 25 MG PO CAPS: 50.0000 mg | ORAL_CAPSULE | Freq: Every day | ORAL | Status: DC

## 2012-04-15 MED ORDER — MAGNESIUM SULFATE 40 MG/ML IJ SOLN
2.0000 g | Freq: Once | INTRAMUSCULAR | Status: AC
  Administered 2012-04-15: 2 g via INTRAVENOUS
  Filled 2012-04-15: qty 50

## 2012-04-15 NOTE — ED Provider Notes (Signed)
I was present consultation and examined the patient in question during their ED stay and agree with the resident's documentation and resident's medical plan.  Jones Skene, MD   Jones Skene, MD 04/15/12 [phone]

## 2012-04-15 NOTE — ED Provider Notes (Signed)
HA cocktail and IV Magnesium]  3:45 AM on recheck feeling better and would like to go home, no neuro deficits on exam. Stable for d/c home with HA precautions verbalized as understood.    Sunnie Nielsen, MD 04/15/12 [phone]

## 2012-06-22 ENCOUNTER — Emergency Department (HOSPITAL_BASED_OUTPATIENT_CLINIC_OR_DEPARTMENT_OTHER)
Admission: EM | Admit: 2012-06-22 | Discharge: 2012-06-22 | Disposition: A | Discharge status: Home / Self Care | Payer: 59 | Attending: Emergency Medicine | Admitting: Emergency Medicine

## 2012-06-22 ENCOUNTER — Encounter (HOSPITAL_BASED_OUTPATIENT_CLINIC_OR_DEPARTMENT_OTHER): Payer: Self-pay | Admitting: *Deleted

## 2012-06-22 DIAGNOSIS — Z9889 Other specified postprocedural states: Secondary | ICD-10-CM | POA: Insufficient documentation

## 2012-06-22 DIAGNOSIS — K029 Dental caries, unspecified: Secondary | ICD-10-CM | POA: Insufficient documentation

## 2012-06-22 DIAGNOSIS — Z8742 Personal history of other diseases of the female genital tract: Secondary | ICD-10-CM | POA: Insufficient documentation

## 2012-06-22 DIAGNOSIS — Z791 Long term (current) use of non-steroidal anti-inflammatories (NSAID): Secondary | ICD-10-CM | POA: Insufficient documentation

## 2012-06-22 DIAGNOSIS — Z79899 Other long term (current) drug therapy: Secondary | ICD-10-CM | POA: Insufficient documentation

## 2012-06-22 DIAGNOSIS — R110 Nausea: Secondary | ICD-10-CM | POA: Insufficient documentation

## 2012-06-22 DIAGNOSIS — L519 Erythema multiforme, unspecified: Secondary | ICD-10-CM | POA: Insufficient documentation

## 2012-06-22 DIAGNOSIS — Z8679 Personal history of other diseases of the circulatory system: Secondary | ICD-10-CM | POA: Insufficient documentation

## 2012-06-22 DIAGNOSIS — E079 Disorder of thyroid, unspecified: Secondary | ICD-10-CM | POA: Insufficient documentation

## 2012-06-22 MED ORDER — CLINDAMYCIN HCL 150 MG PO CAPS
300.0000 mg | ORAL_CAPSULE | Freq: Four times a day (QID) | ORAL | Status: DC
Start: 2012-06-22 — End: 2012-10-29

## 2012-06-22 MED ORDER — HYDROCODONE-ACETAMINOPHEN 5-325 MG PO TABS: 2.0000 | ORAL_TABLET | ORAL | Status: DC | PRN

## 2012-06-22 NOTE — ED Provider Notes (Signed)
History     CSN: [id number]  Arrival date & time 06/22/12  4132   First MD Initiated Contact with Patient 06/22/12 [phone]      Chief Complaint  Patient presents with  . Jaw Pain    (Consider location/radiation/quality/duration/timing/severity/associated sxs/prior treatment) HPI Comments: Patient presents with a two-day history of worsening tooth pain. It started yesterday and got markedly worse this morning. She has pain to her left upper tooth. She denies any facial swelling. She denies any vomiting although she has had some nausea. She denies any known fevers. She does currently have a dentist. She also is currently breast-feeding   Past Medical History  Diagnosis Date  . Thyroid disease   . Hypothyroidism   . Tachycardia   . Headache   . Preterm contractions 12/07/2011  . Maternal anemia complicating pregnancy, childbirth, or the puerperium 01/03/2012    Past Surgical History  Procedure Date  . Liver patched   . Intestines removed   . Knee surgery   . Cesarean section 01/02/2012    Procedure: CESAREAN SECTION;  Surgeon: Lenoard Aden, MD;  Location: [facility];  Service: Gynecology;  Laterality: N/A;    Family History  Problem Relation Age of Onset  . Diabetes Father   . Hypertension Father   . Other Neg Hx     History  Substance Use Topics  . Smoking status: Never Smoker   . Smokeless tobacco: Never Used  . Alcohol Use: No    OB History    Grav Para Term Preterm Abortions TAB SAB Ect Mult Living   4 1 1  0 2 1 0 1 0 1      Review of Systems  Constitutional: Negative for fever.  HENT: Positive for dental problem.   Gastrointestinal: Positive for nausea. Negative for vomiting.  Skin: Negative for rash.  Neurological: Negative for headaches.    Allergies  Penicillins and Morphine and related  Home Medications   Current Outpatient Rx  Name  Route  Sig  Dispense  Refill  . ACETAMINOPHEN 500 MG PO TABS   Oral   Take 500 mg by mouth every 4 (four) hours  as needed. To reduce fever.         Marland Kitchen CLINDAMYCIN HCL 150 MG PO CAPS   Oral   Take 2 capsules (300 mg total) by mouth every 6 (six) hours. For 7 days   28 capsule   0   . DIPHENHYDRAMINE HCL 25 MG PO CAPS   Oral   Take 2 capsules (50 mg total) by mouth at bedtime.   30 capsule   0   . HYDROCODONE-ACETAMINOPHEN 5-325 MG PO TABS   Oral   Take 2 tablets by mouth every 4 (four) hours as needed for pain.   15 tablet   0   . POLYSACCHARIDE IRON COMPLEX 150 MG PO CAPS   Oral   Take 1 capsule (150 mg total) by mouth daily.   30 capsule   3   . LEVOTHYROXINE SODIUM 175 MCG PO TABS   Oral   Take 175 mcg by mouth daily.         Marland Kitchen NAPROXEN 500 MG PO TABS   Oral   Take 1 tablet (500 mg total) by mouth 2 (two) times daily.   30 tablet   0   . PRENATAL MULTIVITAMIN CH   Oral   Take 1 tablet by mouth daily.         Marland Kitchen PROMETHAZINE HCL 25 MG  PO TABS   Oral   Take 1 tablet (25 mg total) by mouth every 6 (six) hours as needed for nausea.   30 tablet   0     BP 141/101  Pulse 78  Temp 98.5 F (36.9 C) (Oral)  Resp 20  Ht 5' (1.524 m)  Wt 96 lb (43.545 kg)  BMI 18.75 kg/m2  SpO2 100%  Breastfeeding? Yes  Physical Exam  Constitutional: She appears well-developed and well-nourished.  HENT:  Mouth/Throat: Oropharynx is clear and moist.       Positive for erythema and mild swelling with significant tooth decay to the left upper first molar. There is no facial swelling. No induration or fluctuance. No trismus. Uvula is midline.  Neck: Normal range of motion. Neck supple.  Cardiovascular: Normal rate.   Pulmonary/Chest: Effort normal.  Skin: Skin is warm and dry.    ED Course  Procedures (including critical care time)  Labs Reviewed - No data to display No results found.   1. Dental caries       MDM  Will start patient on clindamycin and Vicodin for pain. I instructed her followup with the dentist as to his possible return here as needed for any worsening  symptoms. I also did notify her that clindamycin is controversial and lactation and that for now it would be best to pump and dump while she's taking antibiotics unless she gets cleared by her OB/GYN.        Rolan Bucco, MD 06/22/12 [phone]

## 2012-06-22 NOTE — ED Notes (Signed)
Instructed patient to call ob/gyn Monday morning to discuss breast feeding related to antibiotics, patient verbalized understanding

## 2012-06-22 NOTE — ED Notes (Signed)
C/o l upper jaw pain pain,  She does not know of any injury

## 2012-07-02 HISTORY — PX: BRONCHOSCOPY: SUR163

## 2012-10-29 ENCOUNTER — Encounter (HOSPITAL_COMMUNITY): Payer: Self-pay | Admitting: *Deleted

## 2012-10-29 ENCOUNTER — Emergency Department (HOSPITAL_COMMUNITY)
Admission: EM | Admit: 2012-10-29 | Discharge: 2012-10-29 | Disposition: A | Discharge status: Home / Self Care | Payer: 59 | Attending: Emergency Medicine | Admitting: Emergency Medicine

## 2012-10-29 ENCOUNTER — Emergency Department (HOSPITAL_COMMUNITY): Payer: 59

## 2012-10-29 DIAGNOSIS — Z880 Allergy status to penicillin: Secondary | ICD-10-CM | POA: Insufficient documentation

## 2012-10-29 DIAGNOSIS — Z3202 Encounter for pregnancy test, result negative: Secondary | ICD-10-CM | POA: Insufficient documentation

## 2012-10-29 DIAGNOSIS — R042 Hemoptysis: Secondary | ICD-10-CM | POA: Insufficient documentation

## 2012-10-29 DIAGNOSIS — R071 Chest pain on breathing: Secondary | ICD-10-CM | POA: Insufficient documentation

## 2012-10-29 DIAGNOSIS — R0602 Shortness of breath: Secondary | ICD-10-CM | POA: Insufficient documentation

## 2012-10-29 DIAGNOSIS — Z79899 Other long term (current) drug therapy: Secondary | ICD-10-CM | POA: Insufficient documentation

## 2012-10-29 DIAGNOSIS — E039 Hypothyroidism, unspecified: Secondary | ICD-10-CM | POA: Insufficient documentation

## 2012-10-29 DIAGNOSIS — Z8679 Personal history of other diseases of the circulatory system: Secondary | ICD-10-CM | POA: Insufficient documentation

## 2012-10-29 DIAGNOSIS — Z8669 Personal history of other diseases of the nervous system and sense organs: Secondary | ICD-10-CM | POA: Insufficient documentation

## 2012-10-29 DIAGNOSIS — J40 Bronchitis, not specified as acute or chronic: Secondary | ICD-10-CM | POA: Insufficient documentation

## 2012-10-29 LAB — CBC WITH DIFFERENTIAL/PLATELET
Basophils Absolute: 0 10*3/uL (ref 0.0–0.1)
HCT: 37.4 % (ref 36.0–46.0)
Lymphocytes Relative: 34 % (ref 12–46)
Lymphs Abs: 2.4 10*3/uL (ref 0.7–4.0)
MCV: 88.2 fL (ref 78.0–100.0)
Neutro Abs: 4.2 10*3/uL (ref 1.7–7.7)
Platelets: 218 10*3/uL (ref 150–400)
RBC: 4.24 MIL/uL (ref 3.87–5.11)
RDW: 14.2 % (ref 11.5–15.5)
WBC: 7 10*3/uL (ref 4.0–10.5)

## 2012-10-29 LAB — COMPREHENSIVE METABOLIC PANEL
ALT: 65 U/L — ABNORMAL HIGH (ref 0–35)
AST: 56 U/L — ABNORMAL HIGH (ref 0–37)
Alkaline Phosphatase: 112 U/L (ref 39–117)
CO2: 23 mEq/L (ref 19–32)
Chloride: 107 mEq/L (ref 96–112)
GFR calc Af Amer: >90 mL/min (ref >90)
GFR calc non Af Amer: >90 mL/min (ref >90)
Glucose, Bld: 77 mg/dL (ref 70–99)
Sodium: 140 mEq/L (ref 135–145)
Total Bilirubin: 2 mg/dL — ABNORMAL HIGH (ref 0.3–1.2)

## 2012-10-29 LAB — POCT PREGNANCY, URINE: Preg Test, Ur: NEGATIVE

## 2012-10-29 MED ORDER — IOHEXOL 350 MG/ML SOLN
100.0000 mL | Freq: Once | INTRAVENOUS | Status: AC | PRN
  Administered 2012-10-29: 100 mL via INTRAVENOUS

## 2012-10-29 MED ORDER — HYDROMORPHONE HCL PF 1 MG/ML IJ SOLN
0.5000 mg | Freq: Once | INTRAMUSCULAR | Status: AC
  Administered 2012-10-29: 0.5 mg via INTRAVENOUS
  Filled 2012-10-29: qty 1

## 2012-10-29 MED ORDER — AZITHROMYCIN 250 MG PO TABS: 250.0000 mg | ORAL_TABLET | Freq: Every day | ORAL | Status: DC

## 2012-10-29 NOTE — ED Notes (Signed)
[id number]<GM> Expected date:<BR> Expected time:<BR> Means of arrival:<BR> Comments:<BR> Coughing blood

## 2012-10-29 NOTE — ED Provider Notes (Signed)
History     CSN: [id number]  Arrival date & time 10/29/12  1607   First MD Initiated Contact with Patient 10/29/12 1610      Chief Complaint  Patient presents with  . Chest Pain  . Cough    (Consider location/radiation/quality/duration/timing/severity/associated sxs/prior treatment) HPI Comments: Patient is a 22 year old female with a past medical history of hypothyroidism who presents with a 3 week history of hemoptysis. Symptoms started gradually and progressively worsened since the onset. Patient reports coughing up a handful of blood a few times per day. In the past week, the patient has developed associated substernal chest pain and SOB. The chest pain is pleuritic. No aggravating/alleviating factors. Patient reports seeing her PCP the other day where she was tested for TB, which was negative. Patient reports recently stopping birth control pills 2 weeks ago. She denies recent travel, recent surgery, previous DVT/PE.   Patient is a 22 y.o. female presenting with chest pain and cough.  Chest Pain Associated symptoms: cough and shortness of breath   Cough Associated symptoms: chest pain and shortness of breath     Past Medical History  Diagnosis Date  . Thyroid disease   . Hypothyroidism   . Tachycardia   . Headache   . Preterm contractions 12/07/2011  . Maternal anemia complicating pregnancy, childbirth, or the puerperium 01/03/2012    Past Surgical History  Procedure Laterality Date  . Liver patched    . Intestines removed    . Knee surgery    . Cesarean section  01/02/2012    Procedure: CESAREAN SECTION;  Surgeon: Lenoard Aden, MD;  Location: [facility];  Service: Gynecology;  Laterality: N/A;    Family History  Problem Relation Age of Onset  . Diabetes Father   . Hypertension Father   . Other Neg Hx     History  Substance Use Topics  . Smoking status: Never Smoker   . Smokeless tobacco: Never Used  . Alcohol Use: No    OB History   Grav Para Term Preterm  Abortions TAB SAB Ect Mult Living   4 1 1  0 2 1 0 1 0 1      Review of Systems  Respiratory: Positive for cough and shortness of breath.   Cardiovascular: Positive for chest pain.  All other systems reviewed and are negative.    Allergies  Penicillins and Morphine and related  Home Medications   Current Outpatient Rx  Name  Route  Sig  Dispense  Refill  . diphenhydrAMINE (BENADRYL) 25 mg capsule   Oral   Take 25 mg by mouth every 6 (six) hours as needed for itching or allergies.         Marland Kitchen iron polysaccharides (NIFEREX) 150 MG capsule   Oral   Take 1 capsule (150 mg total) by mouth daily.   30 capsule   3   . levothyroxine (SYNTHROID, LEVOTHROID) 175 MCG tablet   Oral   Take 175 mcg by mouth daily.         . Prenatal Vit-Fe Fumarate-FA (PRENATAL MULTIVITAMIN) TABS   Oral   Take 1 tablet by mouth daily.         . promethazine (PHENERGAN) 25 MG tablet   Oral   Take 1 tablet (25 mg total) by mouth every 6 (six) hours as needed for nausea.   30 tablet   0     BP 130/90  Pulse 67  Temp(Src) 98.4 F (36.9 C) (Oral)  Resp  18  SpO2 100%  Breastfeeding? Yes  Physical Exam  Nursing note and vitals reviewed. Constitutional: She is oriented to person, place, and time. She appears well-developed and well-nourished. No distress.  HENT:  Head: Normocephalic and atraumatic.  Eyes: Conjunctivae and EOM are normal. Pupils are equal, round, and reactive to light. No scleral icterus.  Neck: Normal range of motion.  Cardiovascular: Normal rate and regular rhythm.  Exam reveals no gallop and no friction rub.   No murmur heard. Pulmonary/Chest: Effort normal and breath sounds normal. She has no wheezes. She has no rales. She exhibits no tenderness.  Abdominal: Soft. She exhibits no distension. There is no tenderness. There is no rebound and no guarding.  Musculoskeletal: Normal range of motion.  Neurological: She is alert and oriented to person, place, and time.  Coordination normal.  Speech is goal-oriented. Moves limbs without ataxia.   Skin: Skin is warm and dry.  Psychiatric: She has a normal mood and affect. Her behavior is normal.    ED Course  Procedures (including critical care time)   Date: 10/29/2012  Rate: 68  Rhythm: normal sinus rhythm  QRS Axis: normal  Intervals: normal  ST/T Wave abnormalities: normal  Conduction Disutrbances:none  Narrative Interpretation: NSR  Old EKG Reviewed: none available    Labs Reviewed  COMPREHENSIVE METABOLIC PANEL - Abnormal; Notable for the following:    AST 56 (*)    ALT 65 (*)    Total Bilirubin 2.0 (*)    All other components within normal limits  CBC WITH DIFFERENTIAL  PROTIME-INR  APTT  POCT I-STAT TROPONIN I  POCT PREGNANCY, URINE   Dg Chest 2 View  10/29/2012  *RADIOLOGY REPORT*  Clinical Data: Chest pain.  Cough.  Tachycardia.  CHEST - 2 VIEW  Comparison: 10/22/2012.  Findings:  Cardiopericardial silhouette within normal limits. Mediastinal contours normal. Trachea midline.  No airspace disease or effusion. Monitoring leads are projected over the chest. No pneumothorax.  Linear density at the left apex is probably external to the patient, not present on recent prior examination.  IMPRESSION: No active cardiopulmonary disease.   Original Report Authenticated By: Andreas Newport, M.D.    Ct Angio Chest Pe W/cm &/or Wo Cm  10/29/2012  *RADIOLOGY REPORT*  Clinical Data: 2-week history of chest pain, shortness of breath, and hemoptysis.  CT ANGIOGRAPHY CHEST  Technique:  Multidetector CT imaging of the chest using the standard protocol during bolus administration of intravenous contrast. Multiplanar reconstructed images including MIPs were obtained and reviewed to evaluate the vascular anatomy.  Contrast: OMNIPAQUE IOHEXOL 350 MG/ML SOLN  Comparison: None.  Findings: Contrast opacification of the pulmonary arteries is good. Motion is present due to the hyperdynamic heart consistent with  the young age.  Overall, the study is of good diagnostic quality.  No filling defects within either main pulmonary artery or their branches in either lung to suggest pulmonary embolism.  Heart size normal.  No visible coronary atherosclerosis.  No visible atherosclerosis involving the thoracic or upper abdominal aorta or their visualized branches.  No pericardial effusion.  Suboptimal inspiration accounts for minimal atelectasis in the lower lobes.  Scattered areas of hyperlucency consistent with localized air trapping.  No confluent airspace consolidation.  No pleural effusions.  No pulmonary parenchymal nodules or masses. Central airways patent with moderate bronchial wall thickening.  No significant mediastinal, hilar, or axillary lymphadenopathy. Visualized upper abdomen unremarkable for the early arterial phase of enhancement.  Bone window images unremarkable.  IMPRESSION:  1.  No evidence of pulmonary embolism. 2.  Scattered areas of hyperlucency consistent with localized air trapping as can be seen in patients with bronchitis and/or asthma. Moderate central peribronchial thickening is also consistent with this diagnosis. 3.  No acute cardiopulmonary disease otherwise.   Original Report Authenticated By: Hulan Saas, M.D.      1. Bronchitis       MDM  5:07 PM Labs pending. Patient declines pain medication at this time. Vitals stable and patient is afebrile. Patient will have CT chest.   7:24 PM Labs, chest xray, and CT chest unremarkable for acute changes or emergent conditions. I will treat the patient for bronchitis based on symptoms and impression from CT scan. Vital stable and patient afebrile. Patient has a follow up appointment with a Pulmonologist soon. Patient instructed to return with worsening or concerning symptoms. No further evaluation needed at this time.       Emilia Beck, [place] 10/29/12 2116

## 2012-10-29 NOTE — ED Notes (Addendum)
Pt c/o bilateral chest pain and coughing up blood x's 2 weeks. Reports SOB when pain comes. Denies n/v. Reports pain is worse when lying.

## 2012-10-30 NOTE — ED Provider Notes (Signed)
Medical screening examination/treatment/procedure(s) were performed by non-physician practitioner and as supervising physician I was immediately available for consultation/collaboration.   Lorin Hauck, MD 10/30/12 0011 

## 2013-01-15 ENCOUNTER — Emergency Department (INDEPENDENT_AMBULATORY_CARE_PROVIDER_SITE_OTHER)
Admission: EM | Admit: 2013-01-15 | Discharge: 2013-01-15 | Disposition: A | Discharge status: Nursing Home (Includes ICF) | Payer: 59 | Source: Home / Self Care

## 2013-01-15 ENCOUNTER — Encounter (HOSPITAL_COMMUNITY): Payer: Self-pay

## 2013-01-15 ENCOUNTER — Emergency Department (HOSPITAL_COMMUNITY)
Admission: EM | Admit: 2013-01-15 | Discharge: 2013-01-15 | Discharge status: Against Medical Advice | Payer: 59 | Attending: Emergency Medicine | Admitting: Emergency Medicine

## 2013-01-15 ENCOUNTER — Encounter (HOSPITAL_COMMUNITY): Payer: Self-pay | Admitting: *Deleted

## 2013-01-15 DIAGNOSIS — R110 Nausea: Secondary | ICD-10-CM | POA: Insufficient documentation

## 2013-01-15 DIAGNOSIS — R51 Headache: Secondary | ICD-10-CM

## 2013-01-15 DIAGNOSIS — G43909 Migraine, unspecified, not intractable, without status migrainosus: Secondary | ICD-10-CM | POA: Insufficient documentation

## 2013-01-15 LAB — CBC WITH DIFFERENTIAL/PLATELET
Basophils Relative: 0 % (ref 0–1)
HCT: 38.4 % (ref 36.0–46.0)
Hemoglobin: 13.1 g/dL (ref 12.0–15.0)
Lymphs Abs: 1.7 10*3/uL (ref 0.7–4.0)
MCHC: 34.1 g/dL (ref 30.0–36.0)
Monocytes Absolute: 0.3 10*3/uL (ref 0.1–1.0)
Monocytes Relative: 5 % (ref 3–12)
Neutro Abs: 3.4 10*3/uL (ref 1.7–7.7)
RBC: 4.35 MIL/uL (ref 3.87–5.11)

## 2013-01-15 LAB — POCT I-STAT, CHEM 8
BUN: 14 mg/dL (ref 6–23)
Calcium, Ion: 1.19 mmol/L (ref 1.12–1.23)
Chloride: 107 mEq/L (ref 96–112)
Creatinine, Ser: 0.7 mg/dL (ref 0.50–1.10)
Glucose, Bld: 78 mg/dL (ref 70–99)
HCT: 43 % (ref 36.0–46.0)

## 2013-01-15 MED ORDER — ONDANSETRON 4 MG PO TBDP
4.0000 mg | ORAL_TABLET | Freq: Once | ORAL | Status: AC
  Administered 2013-01-15: 4 mg via ORAL

## 2013-01-15 MED ORDER — HYDROMORPHONE HCL PF 1 MG/ML IJ SOLN
INTRAMUSCULAR | Status: AC
  Filled 2013-01-15: qty 1

## 2013-01-15 MED ORDER — DIPHENHYDRAMINE HCL 50 MG/ML IJ SOLN
INTRAMUSCULAR | Status: AC
  Filled 2013-01-15: qty 1

## 2013-01-15 MED ORDER — DIPHENHYDRAMINE HCL 50 MG/ML IJ SOLN
25.0000 mg | Freq: Once | INTRAMUSCULAR | Status: AC
  Administered 2013-01-15: 25 mg via INTRAMUSCULAR

## 2013-01-15 MED ORDER — HYDROMORPHONE HCL 1 MG/ML IJ SOLN
1.0000 mg | Freq: Once | INTRAMUSCULAR | Status: AC
Start: 2013-01-15 — End: 2013-01-15
  Administered 2013-01-15: 1 mg via INTRAMUSCULAR

## 2013-01-15 MED ORDER — ONDANSETRON 4 MG PO TBDP
ORAL_TABLET | ORAL | Status: AC
  Filled 2013-01-15: qty 1

## 2013-01-15 NOTE — ED Provider Notes (Signed)
Medical screening examination/treatment/procedure(s) were performed by a resident physician or non-physician practitioner and as the supervising physician I was immediately available for consultation/collaboration.  Clementeen Graham, MD   Rodolph Bong, MD 01/15/13 [id number]

## 2013-01-15 NOTE — ED Notes (Signed)
Pt  Reports headache  Better    No  Signs of any rash  Or reaction to  meds   Pt  Awaiting  Transfer  To  [facility]

## 2013-01-15 NOTE — ED Provider Notes (Signed)
History    CSN: [id number] Arrival date & time 01/15/13  1118  First MD Initiated Contact with Patient 01/15/13 1250     Chief Complaint  Patient presents with  . Headache   (Consider location/radiation/quality/duration/timing/severity/associated sxs/prior Treatment) HPI  22 yo female presents today with severe headache x 3 days.  Progressively worsening last 24 hours.  Feels like this is "the worst headache i've had."  Hx of migraines and has been seen by dr Fayrene Fearing [facility] neurologist.  Has not called his office since symptoms started.  Feels like the room is spinning and has photophobia.  Some nausea.  Has tried ibuprofen and excedrin without relief.   Past Medical History  Diagnosis Date  . Thyroid disease   . Hypothyroidism   . Tachycardia   . Headache(784.0)   . Preterm contractions 12/07/2011  . Maternal anemia complicating pregnancy, childbirth, or the puerperium 01/03/2012   Past Surgical History  Procedure Laterality Date  . Liver patched    . Intestines removed    . Knee surgery    . Cesarean section  01/02/2012    Procedure: CESAREAN SECTION;  Surgeon: Lenoard Aden, MD;  Location: [facility];  Service: Gynecology;  Laterality: N/A;   Family History  Problem Relation Age of Onset  . Diabetes Father   . Hypertension Father   . Other Neg Hx    History  Substance Use Topics  . Smoking status: Never Smoker   . Smokeless tobacco: Never Used  . Alcohol Use: No   OB History   Grav Para Term Preterm Abortions TAB SAB Ect Mult Living   4 1 1  0 2 1 0 1 0 1     Review of Systems  Constitutional: Positive for activity change, appetite change and fatigue. Negative for fever and chills.  Eyes: Positive for photophobia.  Respiratory: Negative.   Cardiovascular: Negative.   Gastrointestinal: Negative.   Endocrine: Negative.   Genitourinary: Negative.   Musculoskeletal: Negative.   Skin: Negative.   Allergic/Immunologic: Negative.   Neurological: Positive for dizziness,  light-headedness and headaches. Negative for tremors, seizures, syncope, speech difficulty, weakness and numbness.  Psychiatric/Behavioral: Negative.     Allergies  Penicillins and Morphine and related  Home Medications   Current Outpatient Rx  Name  Route  Sig  Dispense  Refill  . azithromycin (ZITHROMAX Z-PAK) 250 MG tablet   Oral   Take 1 tablet (250 mg total) by mouth daily. 500mg  PO day 1, then 250mg  PO days 205   6 tablet   0   . diphenhydrAMINE (BENADRYL) 25 mg capsule   Oral   Take 25 mg by mouth every 6 (six) hours as needed for itching or allergies.         Marland Kitchen EXPIRED: iron polysaccharides (NIFEREX) 150 MG capsule   Oral   Take 1 capsule (150 mg total) by mouth daily.   30 capsule   3   . levothyroxine (SYNTHROID, LEVOTHROID) 175 MCG tablet   Oral   Take 175 mcg by mouth daily.         . Prenatal Vit-Fe Fumarate-FA (PRENATAL MULTIVITAMIN) TABS   Oral   Take 1 tablet by mouth daily.         . promethazine (PHENERGAN) 25 MG tablet   Oral   Take 1 tablet (25 mg total) by mouth every 6 (six) hours as needed for nausea.   30 tablet   0    BP 111/86  Pulse 78  Temp(Src)  99 F (37.2 C) (Oral)  Resp 16  SpO2 100% Physical Exam  Constitutional: She is oriented to person, place, and time. She appears well-developed and well-nourished. She appears distressed.  HENT:  Head: Normocephalic and atraumatic.  Eyes: EOM are normal. Pupils are equal, round, and reactive to light.  Neck: Normal range of motion.  Cardiovascular: Normal rate and regular rhythm.   Pulmonary/Chest: Effort normal.  Musculoskeletal: Normal range of motion.  Neurological: She is alert and oriented to person, place, and time.  Skin: Skin is warm and dry.    ED Course  Procedures (including critical care time) Labs Reviewed  CBC WITH DIFFERENTIAL   No results found. No diagnosis found.   Dx:  Severe headache  MDM  Will transfer to ED for further evaluation.  Advised that she  may need further workup with a scan.  Voices understanding.    Zonia Kief, PA-C 01/15/13 1322

## 2013-01-15 NOTE — ED Notes (Signed)
Pt. Has a hx of migraines began 3 days ago. But today the pain is severe.  Sent to [place] from urgent care for treatment.  Denies any vomiting.  Urgent care pt. Benadryl IM, dilaudid Im and Zofran IM,  No change in headache.  Pt. Does fell nauseated/  Alert and oriented X4.

## 2013-01-15 NOTE — ED Notes (Signed)
Provider  Aware  Of   Itching  From  Morphine       He  Ordered  Benadryl   As  Well

## 2013-01-15 NOTE — ED Notes (Addendum)
Called for room at this time.

## 2013-01-15 NOTE — ED Notes (Signed)
Pt  Reports  Symptoms  Of  Headache      X  3  Days   -  History  Of  Migraines         Became  Dizzy  This  Am  At  Work    She  Reports  Some  Photophobia  As  Well

## 2013-01-15 NOTE — ED Notes (Signed)
Called again for room with no answer

## 2013-02-05 ENCOUNTER — Encounter: Payer: Self-pay | Admitting: Gastroenterology

## 2013-03-06 ENCOUNTER — Ambulatory Visit (INDEPENDENT_AMBULATORY_CARE_PROVIDER_SITE_OTHER): Payer: 59 | Admitting: Gastroenterology

## 2013-03-06 ENCOUNTER — Encounter: Payer: Self-pay | Admitting: Gastroenterology

## 2013-03-06 ENCOUNTER — Ambulatory Visit (INDEPENDENT_AMBULATORY_CARE_PROVIDER_SITE_OTHER): Payer: 59

## 2013-03-06 VITALS — BP 92/64 | HR 60 | Ht 60.0 in | Wt 94.6 lb

## 2013-03-06 DIAGNOSIS — R109 Unspecified abdominal pain: Secondary | ICD-10-CM

## 2013-03-06 LAB — HEPATIC FUNCTION PANEL
ALT: 19 U/L (ref 0–35)
AST: 17 U/L (ref 0–37)
Bilirubin, Direct: 0.2 mg/dL (ref 0.0–0.3)
Total Bilirubin: 3.3 mg/dL — ABNORMAL HIGH (ref 0.3–1.2)
Total Protein: 7.8 g/dL (ref 6.0–8.3)

## 2013-03-06 LAB — CBC WITH DIFFERENTIAL/PLATELET
Basophils Absolute: 0 10*3/uL (ref 0.0–0.1)
Eosinophils Absolute: 0.1 10*3/uL (ref 0.0–0.7)
HCT: 38.7 % (ref 36.0–46.0)
Hemoglobin: 13 g/dL (ref 12.0–15.0)
Lymphs Abs: 2.6 10*3/uL (ref 0.7–4.0)
MCHC: 33.5 g/dL (ref 30.0–36.0)
MCV: 88.1 fl (ref 78.0–100.0)
Monocytes Absolute: 0.3 10*3/uL (ref 0.1–1.0)
Neutro Abs: 3.5 10*3/uL (ref 1.4–7.7)
Platelets: 211 10*3/uL (ref 150.0–400.0)
RDW: 14.1 % (ref 11.5–14.6)

## 2013-03-06 LAB — BASIC METABOLIC PANEL
BUN: 10 mg/dL (ref 6–23)
CO2: 26 mEq/L (ref 19–32)
Chloride: 106 mEq/L (ref 96–112)
Creatinine, Ser: 0.7 mg/dL (ref 0.4–1.2)
Potassium: 3.5 mEq/L (ref 3.5–5.1)

## 2013-03-06 NOTE — Progress Notes (Signed)
HPI: This is a   very pleasant 22 year old woman whom I am meeting for the first time today. She is here with her mother and her 67-month-old daughter.  1 month old daughter.  Had major abdominal surgery as an infant, "over half of her intestines removed"  Done at [facility].  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  Has BM 2-3 times per day.  Never constipated. Always loose stools.  Never sees blood in her stool.  Occasional post prandial epig pains.  Gas like, but not exactly.  Last 4-6 hours.  Nausea is common for her.  Had malabsorption.  She is here for left sided abd pains, could feel a knot.  That pain occurs periodically.  Someone told her she may have ulcerative colitis.  She has chronic pains in abdomen, every day.  They are annoying.  The most concerning is left sided pain.  Doesn't really take pain meds for these, not asking for any either.  No vomiting.  Takes one MVI daily.     She feels like she is losing wieght (2 pounds over past year).  Review of systems: Pertinent positive and negative review of systems were noted in the above HPI section. Complete review of systems was performed and was otherwise normal.    Past Medical History  Diagnosis Date  . Thyroid disease   . Hypothyroidism   . Tachycardia   . Headache(784.0)   . Preterm contractions 12/07/2011  . Maternal anemia complicating pregnancy, childbirth, or the puerperium 01/03/2012    Past Surgical History  Procedure Laterality Date  . Liver patched    . Intestines removed    . Knee surgery    . Cesarean section  01/02/2012    Procedure: CESAREAN SECTION;  Surgeon: Lenoard Aden, MD;  Location: [facility];  Service: Gynecology;  Laterality: N/A;    Current Outpatient Prescriptions  Medication Sig Dispense Refill  . iron polysaccharides (NIFEREX) 150 MG capsule Take 150 mg by mouth 2 (two) times daily.      Marland Kitchen levothyroxine (SYNTHROID, LEVOTHROID) 112 MCG tablet Take 224 mcg by  mouth daily before breakfast.       No current facility-administered medications for this visit.    Allergies as of 03/06/2013 - Review Complete 03/06/2013  Allergen Reaction Noted  . Penicillins Anaphylaxis 09/06/2011  . Morphine and related Itching 09/06/2011    Family History  Problem Relation Age of Onset  . Diabetes Father   . Hypertension Father   . Other Neg Hx     History   Social History  . Marital Status: Married    Spouse Name: N/A    Number of Children: 1  . Years of Education: N/A   Occupational History  . Student    Social History Main Topics  . Smoking status: Never Smoker   . Smokeless tobacco: Never Used  . Alcohol Use: No  . Drug Use: No  . Sexual Activity: No   Other Topics Concern  . Not on file   Social History Narrative  . No narrative on file       Physical Exam: BP 92/64  Pulse 60  Ht 5' (1.524 m)  Wt 94 lb 9.6 oz (42.91 kg)  BMI 18.48 kg/m2 Constitutional: Vitiligo on hands, face, abdomen Psychiatric: alert and oriented x3 Eyes: extraocular movements intact Mouth: oral pharynx moist, no lesions Neck: supple no lymphadenopathy Cardiovascular: heart regular rate and rhythm Lungs: clear to auscultation bilaterally Abdomen: soft, nontender, nondistended,  no obvious ascites, no peritoneal signs, normal bowel sounds Extremities: no lower extremity edema bilaterally Skin: no lesions on visible extremities    Assessment and plan: 22 y.o. 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 She is not here seeking drug drugs or pain medicines at all. She is thin and has always been thin, probably losing a little weight lately. Always has trouble keeping up her weight. 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 Going to get a basic set of labs including CBC, liver tests, serum vitamin levels. 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 We'll try to get records from [facility] sent here for review. I'm getting a CT scan of her abdomen and pelvis for this left lower quadrant pain which I suspect is related to intra-abdominal effusions. Indeed she tells me that during C-section her obstetrician told her she had a battle a lot of scar tissue.

## 2013-03-06 NOTE — Patient Instructions (Addendum)
We will get records regarding your colon, intestinal surgery [facility]). You will have labs checked today in the basement lab.  Please head down after you check out with the front desk  (cbc, bmet, LFTs (fractionated bilirubin), vitamin A, vitamin D, vitamin E, vitamin K, inr). Nutritionist referral for general recommendations for "short gut syndrome".   You will be set up for a CT scan of abdomen and pelvis with IV and oral contrast for left sided abdominal pains. Please return to see Dr. Christella Hartigan in 6-8 weeks.  You have been scheduled for a CT scan of the abdomen and pelvis at [place] [facility] ([address]---this is in the same building as Architectural technologist).   You are scheduled on 03/09/13 at 10 am. You should arrive 15 minutes prior to your appointment time for registration. Please follow the written instructions below on the day of your exam:  WARNING: IF YOU ARE ALLERGIC TO IODINE/X-RAY DYE, PLEASE NOTIFY RADIOLOGY IMMEDIATELY AT [phone]! YOU WILL BE GIVEN A 13 HOUR PREMEDICATION PREP.  1) Do not eat or drink anything after 6 am (4 hours prior to your test) 2) You have been given 2 bottles of oral contrast to drink. The solution may taste better if refrigerated, but do NOT add ice or any other liquid to this solution. Shake well before drinking.    Drink 1 bottle of contrast @ 8 am (2 hours prior to your exam)  Drink 1 bottle of contrast @ 9 am (1 hour prior to your exam)  You may take any medications as prescribed with a small amount of water except for the following: Metformin, Glucophage, Glucovance, Avandamet, Riomet, Fortamet, Actoplus Met, Janumet, Glumetza or Metaglip. The above medications must be held the day of the exam AND 48 hours after the exam.  The purpose of you drinking the oral contrast is to aid in the visualization of your intestinal tract. The contrast solution may cause some diarrhea. Before your exam is started, you will be given a small amount of  fluid to drink. Depending on your individual set of symptoms, you may also receive an intravenous injection of x-ray contrast/dye. Plan on being at [facility] for 30 minutes or long, depending on the type of exam you are having performed.  This test typically takes 30-45 minutes to complete.  If you have any questions regarding your exam or if you need to reschedule, you may call the CT department at [phone] between the hours of 8:00 am and 5:00 pm, Monday-Friday.  ________________________________________________________________________

## 2013-03-07 LAB — BILIRUBIN, TOTAL: Total Bilirubin: 3.5 mg/dL — ABNORMAL HIGH (ref 0.3–1.2)

## 2013-03-07 LAB — VITAMIN D 25 HYDROXY (VIT D DEFICIENCY, FRACTURES): Vit D, 25-Hydroxy: 19 ng/mL — ABNORMAL LOW (ref 30–89)

## 2013-03-07 LAB — BILIRUBIN,DIRECT & INDIRECT (FRACTIONATED)
Bilirubin, Direct: 0.6 mg/dL — ABNORMAL HIGH (ref 0.0–0.3)
Indirect Bilirubin: 2.9 mg/dL — ABNORMAL HIGH (ref 0.0–0.9)

## 2013-03-09 ENCOUNTER — Ambulatory Visit (INDEPENDENT_AMBULATORY_CARE_PROVIDER_SITE_OTHER)
Admission: RE | Admit: 2013-03-09 | Discharge: 2013-03-09 | Disposition: A | Discharge status: Home / Self Care | Payer: 59 | Source: Ambulatory Visit | Attending: Gastroenterology | Admitting: Gastroenterology

## 2013-03-09 DIAGNOSIS — R109 Unspecified abdominal pain: Secondary | ICD-10-CM

## 2013-03-09 MED ORDER — IOHEXOL 300 MG/ML  SOLN
80.0000 mL | Freq: Once | INTRAMUSCULAR | Status: AC | PRN
  Administered 2013-03-09: 80 mL via INTRAVENOUS

## 2013-04-13 ENCOUNTER — Ambulatory Visit: Payer: 59 | Admitting: *Deleted

## 2013-04-14 ENCOUNTER — Ambulatory Visit: Payer: 59 | Admitting: *Deleted

## 2013-04-27 ENCOUNTER — Ambulatory Visit (INDEPENDENT_AMBULATORY_CARE_PROVIDER_SITE_OTHER): Payer: 59 | Admitting: Gastroenterology

## 2013-04-27 ENCOUNTER — Encounter: Payer: Self-pay | Admitting: Gastroenterology

## 2013-04-27 VITALS — BP 94/68 | HR 68 | Ht 60.0 in | Wt 94.8 lb

## 2013-04-27 DIAGNOSIS — R933 Abnormal findings on diagnostic imaging of other parts of digestive tract: Secondary | ICD-10-CM

## 2013-04-27 MED ORDER — MOVIPREP 100 G PO SOLR: 1.0000 | Freq: Once | ORAL | Status: DC

## 2013-04-27 NOTE — Patient Instructions (Signed)
Will defer breast examination to upcoming gynecology appt. You will be set up for a colonoscopy (MAC sedation, LEC) for intermittent abdominal pains, loose stools, abnormal colon on CT. If you have signficant abd pains, distended belly, vomiting please call her for expedited CT scan for possible bowel obstruction.

## 2013-04-27 NOTE — Progress Notes (Signed)
Review of pertinent gastrointestinal problems: 1. Necrotizing enterocolitis as infant, s/p major abd surgery, bowel resection.  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 2. Constipation: large amount of stool in dilated colon 03/2013   HPI: This is a very pleasant 22 year old woman whom I last saw about a month ago. Since then   CT scan last month, see above.  She completed miralax purge, seemingly pretty well. Not still on miralax.  Vomited at same time as BMs.    She has what sounds like obstruction 1 time every 4-6 months.    Between those episodes she has cramps nearly daily.  BMs do not help relieve the cramps.   Past Medical History  Diagnosis Date  . Thyroid disease   . Hypothyroidism   . Tachycardia   . Headache(784.0)   . Preterm contractions 12/07/2011  . Maternal anemia complicating pregnancy, childbirth, or the puerperium 01/03/2012    Past Surgical History  Procedure Laterality Date  . Liver patched    . Intestines removed    . Knee surgery    . Cesarean section  01/02/2012    Procedure: CESAREAN SECTION;  Surgeon: Lenoard Aden, MD;  Location: [facility];  Service: Gynecology;  Laterality: N/A;    Current Outpatient Prescriptions  Medication Sig Dispense Refill  . iron polysaccharides (NIFEREX) 150 MG capsule Take 150 mg by mouth 2 (two) times daily.      Marland Kitchen levothyroxine (SYNTHROID, LEVOTHROID) 112 MCG tablet Take 224 mcg by mouth daily before breakfast.       No current facility-administered medications for this visit.    Allergies as of 04/27/2013 - Review Complete 04/27/2013  Allergen Reaction Noted  . Penicillins Anaphylaxis 09/06/2011  . Morphine and related Itching 09/06/2011    Family History  Problem Relation Age of Onset  . Diabetes Father   . Hypertension Father   . Other Neg Hx     History   Social History  . Marital Status: Married    Spouse Name: N/A    Number of Children: 1  . Years of Education: N/A   Occupational History   . Student    Social History Main Topics  . Smoking status: Never Smoker   . Smokeless tobacco: Never Used  . Alcohol Use: No  . Drug Use: No  . Sexual Activity: No   Other Topics Concern  . Not on file   Social History Narrative  . No narrative on file      Physical Exam: BP 94/68  Pulse 68  Ht 5' (1.524 m)  Wt 94 lb 12.8 oz (43.001 kg)  BMI 18.51 kg/m2 Constitutional: generally well-appearing Psychiatric: alert and oriented x3 Abdomen: soft, nontender, nondistended, no obvious ascites, no peritoneal signs, normal bowel sounds Breast exam deferred for upcoming gynecology examination    Assessment and plan: 22 y.o. female with previous bowel resection when an infant, well described adhesive disease at recent gynecologic exam, intermittent abdominal of onset seem obstructive, chronic loose stools  Her sigmoid colon was very dilated on recent CT scan. I wonder about postobstructive diarrhea, loose stools. I would like to proceed with colonoscopy at her soonest convenience to really examine the colon mucosa, check for a narrow areas. She has intermittent episodic abdominal symptoms of vomiting, abdominal distention, abdominal pain that can last for one to 2 days but really sound obstructive in nature. She has well known adhesive disease and perhaps this is the cause of obstruction. She knows to call here if she  has another one of those episodes like to proceed with CT scan at that point with IV and oral contrast on expedited fashion. Her left breast was of normal on recent CT scan, she is breast-feeding. She has noticed her left breast has felt unusual to her and is scheduled to meet with her gynecologist in the next 2-3 weeks for breast examination as well as pelvic exam. I deferred breast exam for that upcoming examination.

## 2013-04-28 ENCOUNTER — Encounter: Payer: 59 | Attending: Gastroenterology | Admitting: *Deleted

## 2013-04-28 ENCOUNTER — Encounter: Payer: Self-pay | Admitting: *Deleted

## 2013-04-28 ENCOUNTER — Encounter: Payer: Self-pay | Admitting: Gastroenterology

## 2013-04-28 VITALS — Ht 60.0 in | Wt 94.5 lb

## 2013-04-28 DIAGNOSIS — R109 Unspecified abdominal pain: Secondary | ICD-10-CM | POA: Insufficient documentation

## 2013-04-28 DIAGNOSIS — R198 Other specified symptoms and signs involving the digestive system and abdomen: Secondary | ICD-10-CM

## 2013-04-28 DIAGNOSIS — R634 Abnormal weight loss: Secondary | ICD-10-CM | POA: Insufficient documentation

## 2013-04-28 DIAGNOSIS — R197 Diarrhea, unspecified: Secondary | ICD-10-CM | POA: Insufficient documentation

## 2013-04-28 DIAGNOSIS — Z713 Dietary counseling and surveillance: Secondary | ICD-10-CM | POA: Insufficient documentation

## 2013-04-28 NOTE — Progress Notes (Signed)
  Medical Nutrition Therapy:  Appt start time: 0800   End time:  0845.  Assessment:  Left-sided abdominal pain (789.0)  Pt comes today with 21 lb wt loss in 6 months. MD reports necrotizing enterocolitis as infant and pt is s/p major abd surgery (bowel resection). Pt reports eating mainly egg whites for protein and raw fruits and vegetables (with skins). Has received conflicting information regarding foods to eat/avoid.  Breastfeeds daughter 5 times/day. Reports diarrhea ~5+ times/day starting around 20-30 min post-prandial no matter what she eats.  Appt ended early; daughter became agitated d/t time for her nap.   Preferred Learning Style:  No preference indicated   Learning Readiness:  Ready/Change in progress  MEDICATIONS: Levothyroxine, Iron   DIETARY INTAKE:  Usual eating pattern includes 3 meals and 2 snacks per day. Drinks Ensure protein drinks TID, though no longer receiving WIC benefits so may not be able to afford them. Gave samples of Premier Protein shakes.    Usual physical activity:  Not discussed d/t time constraints  Estimated energy needs: 1600 calories 200 g carbohydrates 90-100 g protein 40-45 g fat  Progress Towards Goal(s):  In progress.   Nutritional Diagnosis:  Wheeler-3.2 Unintentional weight loss As related to short gut syndrome.  As evidenced by patient reported diarrhea 5 times/day and current breastfeeding 5 times/day with low PO intake.    Intervention:  Nutrition education regarding recs for short guy syndrome.  Teaching Method Utilized:  Auditory, Visual  Samples given during visit include:   Premier Protein Shake: 12 bottles Lot: [id number]; Exp: 04/01/14  Barriers to learning/adherence to lifestyle change:  Possibly financial; otherwise none noted.  Demonstrated degree of understanding via:  Teach Back   Monitoring/Evaluation:  Dietary intake, exercise, and body weight in 4 week(s).

## 2013-04-28 NOTE — Patient Instructions (Addendum)
Goals:  Eat small meals more often (5-6 small meals/day)    Estimated energy needs: 1600 calories 200 g carbohydrates 90-100 g protein 40-45 g fat

## 2013-05-07 ENCOUNTER — Other Ambulatory Visit: Payer: Self-pay

## 2013-05-26 ENCOUNTER — Ambulatory Visit: Payer: 59 | Admitting: *Deleted

## 2013-06-10 ENCOUNTER — Telehealth: Payer: Self-pay | Admitting: Gastroenterology

## 2013-06-10 NOTE — Telephone Encounter (Signed)
Free voucher for movi prep left at the front desk for pick up pt is aware

## 2013-06-12 ENCOUNTER — Telehealth: Payer: Self-pay | Admitting: Gastroenterology

## 2013-06-12 ENCOUNTER — Ambulatory Visit: Payer: 59 | Admitting: Gastroenterology

## 2013-06-12 DIAGNOSIS — R220 Localized swelling, mass and lump, head: Secondary | ICD-10-CM

## 2013-06-12 NOTE — Telephone Encounter (Signed)
Pt has been rescheduled for colon and previsit

## 2013-06-12 NOTE — Telephone Encounter (Signed)
[name], I cancelled her colonoscopy this AM (4 hour nose bleed, also 2-3 weeks of facial swelling rquiring internal and external drainage procedure by ENT. Also has CT scan later today with ENT ROV...  Today is not a good day for colonoscopy).  Can you call in her to reschedule colonoscopy for about a month from now, +propofol day.  Thanks

## 2013-06-15 ENCOUNTER — Ambulatory Visit: Payer: 59 | Admitting: Dietician

## 2013-06-19 ENCOUNTER — Emergency Department (HOSPITAL_COMMUNITY): Payer: 59

## 2013-06-19 ENCOUNTER — Emergency Department (HOSPITAL_COMMUNITY)
Admission: EM | Admit: 2013-06-19 | Discharge: 2013-06-19 | Disposition: A | Discharge status: Home / Self Care | Payer: 59 | Attending: Emergency Medicine | Admitting: Emergency Medicine

## 2013-06-19 ENCOUNTER — Encounter (HOSPITAL_COMMUNITY): Payer: Self-pay | Admitting: Emergency Medicine

## 2013-06-19 DIAGNOSIS — Z79899 Other long term (current) drug therapy: Secondary | ICD-10-CM | POA: Insufficient documentation

## 2013-06-19 DIAGNOSIS — L03211 Cellulitis of face: Secondary | ICD-10-CM

## 2013-06-19 DIAGNOSIS — E039 Hypothyroidism, unspecified: Secondary | ICD-10-CM | POA: Insufficient documentation

## 2013-06-19 DIAGNOSIS — Z862 Personal history of diseases of the blood and blood-forming organs and certain disorders involving the immune mechanism: Secondary | ICD-10-CM | POA: Insufficient documentation

## 2013-06-19 DIAGNOSIS — Z880 Allergy status to penicillin: Secondary | ICD-10-CM | POA: Insufficient documentation

## 2013-06-19 DIAGNOSIS — L0201 Cutaneous abscess of face: Secondary | ICD-10-CM | POA: Insufficient documentation

## 2013-06-19 DIAGNOSIS — J329 Chronic sinusitis, unspecified: Secondary | ICD-10-CM

## 2013-06-19 DIAGNOSIS — Z8679 Personal history of other diseases of the circulatory system: Secondary | ICD-10-CM | POA: Insufficient documentation

## 2013-06-19 MED ORDER — LEVOFLOXACIN 750 MG PO TABS: 750.0000 mg | ORAL_TABLET | Freq: Every day | ORAL | Status: DC

## 2013-06-19 MED ORDER — IOHEXOL 300 MG/ML  SOLN
75.0000 mL | Freq: Once | INTRAMUSCULAR | Status: AC | PRN
  Administered 2013-06-19: 75 mL via INTRAVENOUS

## 2013-06-19 NOTE — ED Notes (Signed)
Unable to locate patient x2.

## 2013-06-19 NOTE — ED Provider Notes (Signed)
Medical screening examination/treatment/procedure(s) were performed by non-physician practitioner and as supervising physician I was immediately available for consultation/collaboration.  EKG Interpretation   None         Dajha Urquilla B. Bernette Mayers, MD 06/19/13 [phone]

## 2013-06-19 NOTE — ED Notes (Signed)
Pt returned from CT °

## 2013-06-19 NOTE — ED Notes (Signed)
Pt in c/o continued drainage and pain from tooth extraction site, states the tooth was pulled to help with nerve pain she was experiencing and due to continued pain and drainage she was sent here for further evaluation

## 2013-06-19 NOTE — ED Provider Notes (Signed)
CSN: [id number]     Arrival date & time 06/19/13  1434 History   First MD Initiated Contact with Patient 06/19/13 1625     Chief Complaint  Patient presents with  . Facial Swelling   (Consider location/radiation/quality/duration/timing/severity/associated sxs/prior Treatment) HPI [name] is a 22 y.o. female who presents emergency department complaining of facial swelling. Patient states that she had a left upper tooth extracted 3 weeks ago. States this was done by a Investment banker, corporate. States since then she has had recurrent facial swelling and infections in the left maxillary area. States she has had that area opened up multiple times by her surgeon and has had a drain placed in the 2 weeks ago. States drain was removed after it improved. States as soon as the drain was removed the area began to swell again. Last week she states it was worse than ever before with the swelling in the extended around her left thigh. States her eye was swollen shut. States she was seen by her doctor that time and had it drained. States it has improved since then, but she was sent here for further evaluation and CT of soft tissue to r/o deeper abscess. Pt states she was also seen by ENT doctor last week for a nose bleed, who stated that the needed to follow up with her surgeon regarding this abscess. Pt stats she finished 10 days of antibiotics 1 week ago. She denies any fever, chills. states has some pain with extraocular movement but it is improving.     Past Medical History  Diagnosis Date  . Thyroid disease   . Hypothyroidism   . Tachycardia   . Headache(784.0)   . Preterm contractions 12/07/2011  . Maternal anemia complicating pregnancy, childbirth, or the puerperium 01/03/2012   Past Surgical History  Procedure Laterality Date  . Liver patched    . Intestines removed    . Knee surgery    . Cesarean section  01/02/2012    Procedure: CESAREAN SECTION;  Surgeon: Lenoard Aden, MD;  Location:  [facility];  Service: Gynecology;  Laterality: N/A;   Family History  Problem Relation Age of Onset  . Diabetes Father   . Hypertension Father   . Other Neg Hx    History  Substance Use Topics  . Smoking status: Never Smoker   . Smokeless tobacco: Never Used  . Alcohol Use: No   OB History   Grav Para Term Preterm Abortions TAB SAB Ect Mult Living   4 1 1  0 2 1 0 1 0 1     Review of Systems  Constitutional: Negative for fever and chills.  HENT: Positive for dental problem and facial swelling. Negative for congestion and ear pain.   Eyes: Negative for photophobia, pain and visual disturbance.  Respiratory: Negative for cough, chest tightness and shortness of breath.   Cardiovascular: Negative for chest pain, palpitations and leg swelling.  Musculoskeletal: Negative for arthralgias, myalgias, neck pain and neck stiffness.  Skin: Negative for rash.  Neurological: Negative for dizziness, weakness and headaches.  All other systems reviewed and are negative.    Allergies  Penicillins and Morphine and related  Home Medications   Current Outpatient Rx  Name  Route  Sig  Dispense  Refill  . iron polysaccharides (NIFEREX) 150 MG capsule   Oral   Take 150 mg by mouth 2 (two) times daily.         Marland Kitchen levothyroxine (SYNTHROID, LEVOTHROID) 112 MCG tablet   Oral  Take 224 mcg by mouth daily before breakfast.          BP 116/77  Pulse 72  Temp(Src) 98 F (36.7 C) (Oral)  Resp 18  SpO2 100% Physical Exam  Nursing note and vitals reviewed. Constitutional: She appears well-developed and well-nourished. No distress.  HENT:  Head: Normocephalic.    Right Ear: External ear normal.  Left Ear: External ear normal.  Nose: Nose normal.  Swelling to the left maxilla in the area just inferior to the eye around her nose. Nares appear normal. Dentition normal, no swelling or tenderness around extracted left upper tooth. Mild erythema over medial left eye lid. Tender to palpation.     Eyes: Conjunctivae are normal.  Neck: Neck supple.  Cardiovascular: Normal rate, regular rhythm and normal heart sounds.   Pulmonary/Chest: Effort normal and breath sounds normal. No respiratory distress. She has no wheezes. She has no rales.  Abdominal: Soft. Bowel sounds are normal. She exhibits no distension. There is no tenderness. There is no rebound.  Musculoskeletal: She exhibits no edema.  Neurological: She is alert.  Skin: Skin is warm and dry.  Psychiatric: She has a normal mood and affect. Her behavior is normal.    ED Course  Procedures (including critical care time) Labs Review Labs Reviewed - No data to display Imaging Review Ct Maxillofacial W/cm  06/19/2013   CLINICAL DATA:  Continued pain and drainage from tooth extraction site, facial swelling  EXAM: CT MAXILLOFACIAL WITH CONTRAST  TECHNIQUE: Multidetector CT imaging of the maxillofacial structures was performed with intravenous contrast. Multiplanar CT image reconstructions were also generated. A small metallic BB was placed on the right temple in order to reliably differentiate right from left.  CONTRAST:  75mL OMNIPAQUE IOHEXOL 300 MG/ML  SOLN  COMPARISON:  None.  FINDINGS: Soft tissue swelling overlying the left maxilla (series 2/image 44), suspicious for cellulitis. No drainable fluid collection/abscess.  No evidence of odontogenic abscess. Multiple prior tooth extractions involving the left mandible and maxilla. Multiple dental caries with fillings. Apical lucencies involving the right upper 1st and 2nd bicuspid (tooth #4-5).  Moderate mucosal thickening/partial opacification involving the left maxillary sinus. The visualized paranasal sinuses and mastoid air cells are otherwise clear.  No evidence of maxillofacial fracture.  The bilateral orbits, including the globes and retroconal soft tissues, are within normal limits.  Visualized brain parenchyma is unremarkable.  Cervical spine is within normal limits to C5.   IMPRESSION: Soft tissue swelling overlying the left maxilla, suspicious for cellulitis. No drainable fluid collection/abscess.  Apical lucencies involving the right upper 1st and 2nd bicuspid (tooth #4-5). Multiple prior left tooth extractions. No evidence of odontogenic abscess.  Paranasal sinus disease involving the left maxillary sinus.   Electronically Signed   By: Charline Bills M.D.   On: 06/19/2013 18:42    EKG Interpretation   None       MDM   1. Sinusitis   2. Cellulitis of face     CT results as above. There is suspicion for cellulitis with no drainable fluid collection. Also paranasal sinus disease involving the left maxillary sinus seen. I will start patient on antibiotic. Patient states she recently finished clindamycin x2 in the last month. Will try Levaquin. She is allergic to penicillins. She will followup with her maxillofacial surgeon next week. She states that she was recently worked up for possible lupus and was told that her labs were abnormal and suspicious for lupus but she needed to see a rheumatologist. Will  refer her to a rheumatologist but also explained that she may need to followup with primary care Dr. have them refer her. She was understanding. Home with followup.  Filed Vitals:   06/19/13 1446 06/19/13 1912  BP: 116/77 120/82  Pulse: 72 108  Temp: 98 F (36.7 C) 98.1 F (36.7 C)  TempSrc: Oral Oral  Resp: 18 16  SpO2: 100% 100%       Lottie Mussel, PA-C 06/19/13 1938

## 2013-06-19 NOTE — ED Notes (Signed)
Pt reports that she had a tooth pulled recently and has had intermittent swelling to the left side of her face since then. States that she was sent by her dentist to have a CT done with soft tissue. Reports the swelling is better today. Airway intact. No distress noted.

## 2013-06-21 ENCOUNTER — Telehealth (HOSPITAL_COMMUNITY): Payer: Self-pay | Admitting: Emergency Medicine

## 2013-07-08 ENCOUNTER — Telehealth: Payer: Self-pay | Admitting: Nurse Practitioner

## 2013-07-08 ENCOUNTER — Telehealth: Payer: Self-pay | Admitting: Gynecology

## 2013-07-08 NOTE — Telephone Encounter (Signed)
LMTCB to schedule referral.

## 2013-07-22 ENCOUNTER — Ambulatory Visit (AMBULATORY_SURGERY_CENTER): Payer: Self-pay

## 2013-07-22 ENCOUNTER — Ambulatory Visit (INDEPENDENT_AMBULATORY_CARE_PROVIDER_SITE_OTHER): Payer: Managed Care, Other (non HMO) | Admitting: Gynecology

## 2013-07-22 ENCOUNTER — Telehealth: Payer: Self-pay

## 2013-07-22 ENCOUNTER — Encounter: Payer: Self-pay | Admitting: Gynecology

## 2013-07-22 VITALS — Ht 61.0 in | Wt 92.0 lb

## 2013-07-22 VITALS — BP 104/80 | HR 68 | Resp 14 | Ht 61.0 in | Wt 91.0 lb

## 2013-07-22 DIAGNOSIS — Z0000 Encounter for general adult medical examination without abnormal findings: Secondary | ICD-10-CM

## 2013-07-22 DIAGNOSIS — Z01419 Encounter for gynecological examination (general) (routine) without abnormal findings: Secondary | ICD-10-CM

## 2013-07-22 DIAGNOSIS — Z862 Personal history of diseases of the blood and blood-forming organs and certain disorders involving the immune mechanism: Secondary | ICD-10-CM

## 2013-07-22 DIAGNOSIS — R933 Abnormal findings on diagnostic imaging of other parts of digestive tract: Secondary | ICD-10-CM

## 2013-07-22 DIAGNOSIS — Z8639 Personal history of other endocrine, nutritional and metabolic disease: Secondary | ICD-10-CM

## 2013-07-22 DIAGNOSIS — Z124 Encounter for screening for malignant neoplasm of cervix: Secondary | ICD-10-CM

## 2013-07-22 DIAGNOSIS — K909 Intestinal malabsorption, unspecified: Secondary | ICD-10-CM

## 2013-07-22 DIAGNOSIS — R928 Other abnormal and inconclusive findings on diagnostic imaging of breast: Secondary | ICD-10-CM

## 2013-07-22 DIAGNOSIS — N926 Irregular menstruation, unspecified: Secondary | ICD-10-CM

## 2013-07-22 DIAGNOSIS — G43109 Migraine with aura, not intractable, without status migrainosus: Secondary | ICD-10-CM

## 2013-07-22 DIAGNOSIS — N736 Female pelvic peritoneal adhesions (postinfective): Secondary | ICD-10-CM

## 2013-07-22 DIAGNOSIS — Z309 Encounter for contraceptive management, unspecified: Secondary | ICD-10-CM

## 2013-07-22 DIAGNOSIS — M3500 Sicca syndrome, unspecified: Secondary | ICD-10-CM

## 2013-07-22 LAB — POCT URINALYSIS DIPSTICK
Leukocytes, UA: NEGATIVE
PH UA: 5
Urobilinogen, UA: NEGATIVE

## 2013-07-22 LAB — HEMOGLOBIN, FINGERSTICK: Hemoglobin, fingerstick: 13.4 g/dL (ref 12.0–16.0)

## 2013-07-22 MED ORDER — MOVIPREP 100 G PO SOLR: 1.0000 | Freq: Once | ORAL | Status: DC
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
## 2013-07-22 NOTE — Telephone Encounter (Signed)
Pt lactating/breast feeding.  Give any special instructions to recovery i.e. Does pt need to waste breast milk for 24 hrs?

## 2013-07-22 NOTE — Telephone Encounter (Signed)
[name],  You can breastfeed  hours after propofol.  Thanks,  Jonny RuizJohn

## 2013-07-22 NOTE — Patient Instructions (Signed)
Levonorgestrel intrauterine device (IUD) What is this medicine? LEVONORGESTREL IUD (LEE voe nor jes trel) is a contraceptive (birth control) device. The device is placed inside the uterus by a healthcare professional. It is used to prevent pregnancy and can also be used to treat heavy bleeding that occurs during your period. Depending on the device, it can be used for 3 to 5 years. This medicine may be used for other purposes; ask your health care provider or pharmacist if you have questions. COMMON BRAND NAME(S): Mirena, Skyla What should I tell my health care provider before I take this medicine? They need to know if you have any of these conditions: -abnormal Pap smear -cancer of the breast, uterus, or cervix -diabetes -endometritis -genital or pelvic infection now or in the past -have more than one sexual partner or your partner has more than one partner -heart disease -history of an ectopic or tubal pregnancy -immune system problems -IUD in place -liver disease or tumor -problems with blood clots or take blood-thinners -use intravenous drugs -uterus of unusual shape -vaginal bleeding that has not been explained -an unusual or allergic reaction to levonorgestrel, other hormones, silicone, or polyethylene, medicines, foods, dyes, or preservatives -pregnant or trying to get pregnant -breast-feeding How should I use this medicine? This device is placed inside the uterus by a health care professional. Talk to your pediatrician regarding the use of this medicine in children. Special care may be needed. Overdosage: If you think you have taken too much of this medicine contact a poison control center or emergency room at once. NOTE: This medicine is only for you. Do not share this medicine with others. What if I miss a dose? This does not apply. What may interact with this medicine? Do not take this medicine with any of the following  medications: -amprenavir -bosentan -fosamprenavir This medicine may also interact with the following medications: -aprepitant -barbiturate medicines for inducing sleep or treating seizures -bexarotene -griseofulvin -medicines to treat seizures like carbamazepine, ethotoin, felbamate, oxcarbazepine, phenytoin, topiramate -modafinil -pioglitazone -rifabutin -rifampin -rifapentine -some medicines to treat HIV infection like atazanavir, indinavir, lopinavir, nelfinavir, tipranavir, ritonavir -St. John's wort -warfarin This list may not describe all possible interactions. Give your health care provider a list of all the medicines, herbs, non-prescription drugs, or dietary supplements you use. Also tell them if you smoke, drink alcohol, or use illegal drugs. Some items may interact with your medicine. What should I watch for while using this medicine? Visit your doctor or health care professional for regular check ups. See your doctor if you or your partner has sexual contact with others, becomes HIV positive, or gets a sexual transmitted disease. This product does not protect you against HIV infection (AIDS) or other sexually transmitted diseases. You can check the placement of the IUD yourself by reaching up to the top of your vagina with clean fingers to feel the threads. Do not pull on the threads. It is a good habit to check placement after each menstrual period. Call your doctor right away if you feel more of the IUD than just the threads or if you cannot feel the threads at all. The IUD may come out by itself. You may become pregnant if the device comes out. If you notice that the IUD has come out use a backup birth control method like condoms and call your health care provider. Using tampons will not change the position of the IUD and are okay to use during your period. What side effects may I   notice from receiving this medicine? Side effects that you should report to your doctor or  health care professional as soon as possible: -allergic reactions like skin rash, itching or hives, swelling of the face, lips, or tongue -fever, flu-like symptoms -genital sores -high blood pressure -no menstrual period for 6 weeks during use -pain, swelling, warmth in the leg -pelvic pain or tenderness -severe or sudden headache -signs of pregnancy -stomach cramping -sudden shortness of breath -trouble with balance, talking, or walking -unusual vaginal bleeding, discharge -yellowing of the eyes or skin Side effects that usually do not require medical attention (report to your doctor or health care professional if they continue or are bothersome): -acne -breast pain -change in sex drive or performance -changes in weight -cramping, dizziness, or faintness while the device is being inserted -headache -irregular menstrual bleeding within first 3 to 6 months of use -nausea This list may not describe all possible side effects. Call your doctor for medical advice about side effects. You may report side effects to FDA at 1-800-FDA-1088. Where should I keep my medicine? This does not apply. NOTE: This sheet is a summary. It may not cover all possible information. If you have questions about this medicine, talk to your doctor, pharmacist, or health care provider.  2014, Elsevier/Gold Standard. (2011-07-19 13:54:04)  

## 2013-07-23 LAB — IPS PAP TEST WITH REFLEX TO HPV

## 2013-07-23 LAB — PROLACTIN: PROLACTIN: 2.2 ng/mL

## 2013-07-23 LAB — TSH: TSH: 0.076 u[IU]/mL — AB (ref 0.350–4.500)

## 2013-07-24 ENCOUNTER — Encounter: Payer: Self-pay | Admitting: Gastroenterology

## 2013-07-24 LAB — IPS N GONORRHOEA AND CHLAMYDIA BY PCR

## 2013-07-27 MED ORDER — FLUCONAZOLE 150 MG PO TABS: 150.0000 mg | ORAL_TABLET | Freq: Once | ORAL | Status: DC

## 2013-07-27 NOTE — Addendum Note (Signed)
Addended by: Lorraine LaxSHAW, Marx Doig J on: 07/27/2013 09:39 AM   Modules accepted: Orders

## 2013-07-28 ENCOUNTER — Ambulatory Visit
Admission: RE | Admit: 2013-07-28 | Discharge: 2013-07-28 | Disposition: A | Discharge status: Home / Self Care | Payer: Commercial Indemnity | Source: Ambulatory Visit | Attending: Gynecology | Admitting: Gynecology

## 2013-07-28 DIAGNOSIS — R928 Other abnormal and inconclusive findings on diagnostic imaging of breast: Secondary | ICD-10-CM

## 2013-07-29 ENCOUNTER — Encounter: Payer: Self-pay | Admitting: Gastroenterology

## 2013-07-29 ENCOUNTER — Encounter: Payer: 59 | Admitting: Gastroenterology

## 2013-07-29 ENCOUNTER — Ambulatory Visit (AMBULATORY_SURGERY_CENTER): Payer: Commercial Indemnity | Admitting: Gastroenterology

## 2013-07-29 VITALS — BP 103/66 | HR 50 | Temp 96.5°F | Resp 15 | Ht 61.0 in | Wt 92.0 lb

## 2013-07-29 DIAGNOSIS — R933 Abnormal findings on diagnostic imaging of other parts of digestive tract: Secondary | ICD-10-CM

## 2013-07-29 HISTORY — PX: COLONOSCOPY: SHX174

## 2013-07-29 MED ORDER — SODIUM CHLORIDE 0.9 % IV SOLN: 500.0000 mL | INTRAVENOUS | Status: DC

## 2013-07-29 MED ORDER — CHOLESTYRAMINE 4 G PO PACK
2.0000 g | PACK | Freq: Every day | ORAL | Status: DC
Start: 2013-07-29 — End: 2016-03-07

## 2013-07-29 NOTE — Progress Notes (Signed)
Lidocaine-40mg IV prior to Propofol InductionPropofol given over incremental dosages 

## 2013-07-29 NOTE — Op Note (Signed)
[facility] [address] [place], [id number]   COLONOSCOPY PROCEDURE REPORT  PATIENT: [name], Jamie B.  MR#: [id number] BIRTHDATE: 1991-01-30 , 22  yrs. old GENDER: Female ENDOSCOPIST: Rachael Feeaniel P Jacobs, MD REFERRED XB:JYNWGBY:Brent Doristine CounterBurnett, M.D. PROCEDURE DATE:  07/29/2013 PROCEDURE:   Colonoscopy, diagnostic First Screening Colonoscopy - Avg.  risk and is 50 yrs.  old or older - No.  Prior Negative Screening - Now for repeat screening. N/A  History of Adenoma - Now for follow-up colonoscopy & has been > or = to 3 yrs.  N/A  Polyps Removed Today? No.  Recommend repeat exam, <10 yrs? No. ASA CLASS:   Class II INDICATIONS:1.  Necrotizing enterocolitis as infant, s/p major abd surgery, bowel resection.  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 2. chronic constipation.  Abdominal pain MEDICATIONS: propofol (Diprivan) 100mg  IV and MAC sedation, administered by CRNA  DESCRIPTION OF PROCEDURE:   After the risks benefits and alternatives of the procedure were thoroughly explained, informed consent was obtained.  A digital rectal exam revealed no abnormalities of the rectum.   The LB PFC-H190 [id number]  endoscope was introduced through the anus and advanced to the surgical anastomosis. No adverse events experienced.   The quality of the prep was excellent.  The instrument was then slowly withdrawn as the colon was fully examined.      COLON FINDINGS: The ileocolonic anastomosis in right colon visible, normal appearing.  The colon was a bit more tortuous than usual but was otherwise normal.  Retroflexed views revealed no abnormalities. The time to cecum=NA.  Withdrawal time=NA .  The scope was withdrawn and the procedure completed. COMPLICATIONS: There were no complications.  ENDOSCOPIC IMPRESSION: The ileocolonic anastomosis in right colon visible, normal appearing.  The colon was a bit more tortuous than usual but was otherwise normal.  Perhaps lack of IC  valve, terminal ileum is playing a role her in her chronic loose stools.  RECOMMENDATIONS: Trial of cholestyramine (1/2 dose once daily). Return visit with Dr. Christella HartiganJacobs in 5 weeks.  eSigned:  Rachael Feeaniel P Jacobs, MD 07/29/2013 9:00 AM

## 2013-07-29 NOTE — Patient Instructions (Signed)
Discharge instructions given with verbal understanding. Resume previous medications.YOU HAD AN ENDOSCOPIC PROCEDURE TODAY AT THE [facility]: Refer to the procedure report that was given to you for any specific questions about what was found during the examination.  If the procedure report does not answer your questions, please call your gastroenterologist to clarify.  If you requested that your care partner not be given the details of your procedure findings, then the procedure report has been included in a sealed envelope for you to review at your convenience later.  YOU SHOULD EXPECT: Some feelings of bloating in the abdomen. Passage of more gas than usual.  Walking can help get rid of the air that was put into your GI tract during the procedure and reduce the bloating. If you had a lower endoscopy (such as a colonoscopy or flexible sigmoidoscopy) you may notice spotting of blood in your stool or on the toilet paper. If you underwent a bowel prep for your procedure, then you may not have a normal bowel movement for a few days.  DIET: Your first meal following the procedure should be a light meal and then it is ok to progress to your normal diet.  A half-sandwich or bowl of soup is an example of a good first meal.  Heavy or fried foods are harder to digest and may make you feel nauseous or bloated.  Likewise meals heavy in dairy and vegetables can cause extra gas to form and this can also increase the bloating.  Drink plenty of fluids but you should avoid alcoholic beverages for 24 hours.  ACTIVITY: Your care partner should take you home directly after the procedure.  You should plan to take it easy, moving slowly for the rest of the day.  You can resume normal activity the day after the procedure however you should NOT DRIVE or use heavy machinery for 24 hours (because of the sedation medicines used during the test).    SYMPTOMS TO REPORT IMMEDIATELY: A gastroenterologist can be reached at  any hour.  During normal business hours, 8:30 AM to 5:00 PM Monday through Friday, call [phone].  After hours and on weekends, please call the GI answering service at [phone] who will take a message and have the physician on call contact you.   Following lower endoscopy (colonoscopy or flexible sigmoidoscopy):  Excessive amounts of blood in the stool  Significant tenderness or worsening of abdominal pains  Swelling of the abdomen that is new, acute  Fever of 100F or higher  FOLLOW UP: If any biopsies were taken you will be contacted by phone or by letter within the next 1-3 weeks.  Call your gastroenterologist if you have not heard about the biopsies in 3 weeks.  Our staff will call the home number listed on your records the next business day following your procedure to check on you and address any questions or concerns that you may have at that time regarding the information given to you following your procedure. This is a courtesy call and so if there is no answer at the home number and we have not heard from you through the emergency physician on call, we will assume that you have returned to your regular daily activities without incident.  SIGNATURES/CONFIDENTIALITY: You and/or your care partner have signed paperwork which will be entered into your electronic medical record.  These signatures attest to the fact that that the information above on your After Visit Summary has been reviewed and is understood.    Full responsibility of the confidentiality of this discharge information lies with you and/or your care-partner.  

## 2013-07-30 ENCOUNTER — Telehealth: Payer: Self-pay | Admitting: *Deleted

## 2013-07-30 NOTE — Telephone Encounter (Signed)
Lm on VM that pt left in admitting to return call if problems or questions. ewm

## 2013-07-31 ENCOUNTER — Encounter: Payer: Commercial Indemnity | Attending: Gastroenterology | Admitting: Dietician

## 2013-07-31 ENCOUNTER — Encounter: Payer: Self-pay | Admitting: Dietician

## 2013-07-31 VITALS — Wt 97.4 lb

## 2013-07-31 DIAGNOSIS — K909 Intestinal malabsorption, unspecified: Secondary | ICD-10-CM

## 2013-07-31 DIAGNOSIS — Z9049 Acquired absence of other specified parts of digestive tract: Secondary | ICD-10-CM | POA: Insufficient documentation

## 2013-07-31 DIAGNOSIS — Z713 Dietary counseling and surveillance: Secondary | ICD-10-CM | POA: Insufficient documentation

## 2013-07-31 DIAGNOSIS — K912 Postsurgical malabsorption, not elsewhere classified: Secondary | ICD-10-CM | POA: Insufficient documentation

## 2013-07-31 NOTE — Progress Notes (Signed)
Medical Nutrition Therapy:  Appt start time: 1100 end time:  1130.  Assessment:  Primary concerns today: Pt reports with modest wt gain on F/U from [name] in late October for short bowel syndrome. Pt reports loose stools >5 times daily. This is often in close proximity to eating and may be successive diarhheal bouts in short timeframes. She also reports vomiting when no stools for 45 min to one hour after eating. This happens more often when eating higher fat meals. Resection of small intestine, approximately "half", in 1990. Pt states no one has told her exactly what part of SI or exactly how much was removed. Recent diarrhea may be exasperated by hyperactive thyroid (very low TSH recently).   MEDICATIONS: see list.    DIETARY INTAKE: Usual eating pattern includes 3 meals and 3 snacks per day. Everyday foods include egg whites, fruit, rice.  Avoided foods include most fatty meats.    24-hr recall:  B ( AM): egg whites (6) with OJ, 2 pieces bacon  Snk ( AM): fruit- nectarines, bananas (most common)  L ( PM): rice with mix vegetable and grilled chix (breast) or shrimp. Sweet tea decaf.  Snk ( PM): pomegranate, popsicle (fruit juice based) D ( PM): rice, greens or green beans or broccoli, sometimes meats (sometimes no proteins)- roast beef. Lima beans and okra are also common. Sweet tea or water. Snk ( PM): gatorade, sugar free jello.  Beverages: fruit juices, water, sweet tea decaf, gatorade Pt notes that starchy foods tend to increase fullness more than anything else.  Usual physical activity: yoga and stretches, play with child.   Progress Towards Goal(s):  In progress.   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  As evidenced by pt c/o diarrhea, struggles to maintain weight.    Intervention:  Nutrition counseling provided regarding prevention of diarrhea and vomiting symptoms, improving  absorption of nutrients, preventing oxalate impairment of Zinc, Iron, and Calcium absorption. In particular, RD focused on ensuring adequate protein intake from lean meats, fishes, poultry, lowfat (and very low lactose) dairy options like Kefir and [place] yogurt, as well as choosing refined starches for higher absorption, coconut oil as main cooking oil to add MCTs to diet (very high and rapid absorption). Limiting simple sugars in large doses was a key point of emphasis to prevent diarrhea symptoms, from fruit juices, sweet tea, or simply very large amounts of fruit in any sitting.   Teaching Method Utilized:  Visual Auditory  Handouts given during visit include:  Leanest protein choices  Short Bowel Syndrome eating guide  Barriers to learning/adherence to lifestyle change: preference for many fruits and vegetables, distaste for many proteins.   Low carb protein supplement also provided- Premier Protein 4 bottles.   Demonstrated degree of understanding via:  Teach Back   Monitoring/Evaluation:  Dietary intake, exercise, portion control, and body weight in 2 month(s).

## 2013-08-06 ENCOUNTER — Telehealth: Payer: Self-pay | Admitting: Emergency Medicine

## 2013-08-06 NOTE — Telephone Encounter (Signed)
Message copied by Joeseph AmorFAST, Chanse Kagel L on Thu Aug 06, 2013 12:06 PM ------      Message from: Douglass RiversLATHROP, Severin Bou      Created: Fri Jul 31, 2013  8:33 AM       I had the understanding that this pt was seeing Dr Lucianne MussKumar, so I sent the labs to him, he told me that she was seeing Dr Sharl MaKerr, so I sent the labs again, now Dr Sharl MaKerr tells me that it is not his patient.  Can we find out who her endocrinologist is?  I do not want to adjust medications that were started from another specialist.      Thanks!      TL      ----- Message -----         From: Talmage CoinJeffrey Kerr, MD         Sent: 07/30/2013   6:58 PM           To: Bennye Almracy H Lathrop, MD            Dr. Farrel GobbleLathrop,            I appreciate your attention to this.  It appears that she had been a patient of endocrinologist, Dr. Casimiro NeedleMichael Altheimer, but I do not find any records from any visit with me.            Debara PickettJeff Kerr        ----- Message -----         From: Bennye Almracy H Lathrop, MD         Sent: 07/24/2013  11:06 AM           To: Talmage CoinJeffrey Kerr, MD            I saw Jacques NavyKateria in the office for irregular menses, she was noted to be tachycardic-108 and sweaty, her TSH was low and I have asked her to follow to with you             ------

## 2013-08-06 NOTE — Telephone Encounter (Signed)
Dr. Farrel GobbleLathrop,  [facility] and she is an active patient there.  Records can be faxed to [phone]. Do you need to contact Dr. Leslie DalesAltheimer first? Phone for office is [phone]  I will fax records to Dr. Layne BentonAltheimers attention.

## 2013-09-02 ENCOUNTER — Encounter: Payer: Self-pay | Admitting: Gastroenterology

## 2013-09-02 ENCOUNTER — Ambulatory Visit (INDEPENDENT_AMBULATORY_CARE_PROVIDER_SITE_OTHER): Payer: Commercial Indemnity | Admitting: Gastroenterology

## 2013-09-02 VITALS — BP 70/50 | HR 104 | Ht 60.25 in | Wt 94.1 lb

## 2013-09-02 DIAGNOSIS — K589 Irritable bowel syndrome without diarrhea: Secondary | ICD-10-CM

## 2013-09-02 MED ORDER — HYOSCYAMINE SULFATE ER 0.375 MG PO TB12: 0.3750 mg | ORAL_TABLET | Freq: Two times a day (BID) | ORAL | Status: DC

## 2013-09-02 NOTE — Progress Notes (Signed)
Review of pertinent gastrointestinal problems:  1. 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  2. Constipation: large amount of stool in dilated colon 03/2013; Colonsocopy Christella Hartigan 07/2013: IC anastomosis in right colon, tortuous colon, otherwise normal.     HPI: This is a  very pleasant 23 year old woman whom I last saw the time of colonoscopy about 2 months ago.    She has been taking 1 dose of cholestyramine.  She has noticed firmer stools, much less frequency.  Goes twice per day (much less than 10 per day).  She is however bothered by a lot of abdominal spasm like pains.   Past Medical History  Diagnosis Date  . Hypothyroidism   . Tachycardia   . Preterm contractions 12/07/2011  . Maternal anemia complicating pregnancy, childbirth, or the puerperium 01/03/2012  . Blood transfusion without reported diagnosis 01/02/2012  . Palpitations   . Sjogren's disease   . History of Graves' disease   . Malabsorption syndrome   . Migraine with typical aura     single sided numbness    Past Surgical History  Procedure Laterality Date  . Liver patched    . Intestines removed      newborn  . Knee surgery    . Cesarean section  01/02/2012    Procedure: CESAREAN SECTION;  Surgeon: Lenoard Aden, MD;  Location: [facility];  Service: Gynecology;  Laterality: N/A;    Current Outpatient Prescriptions  Medication Sig Dispense Refill  . cholestyramine (QUESTRAN) 4 G packet Take 0.5 packets (2 g total) by mouth daily.  30 each  12  . ibuprofen (ADVIL,MOTRIN) 600 MG tablet       . iron polysaccharides (NIFEREX) 150 MG capsule Take 150 mg by mouth 2 (two) times daily.      Marland Kitchen levothyroxine (SYNTHROID, LEVOTHROID) 112 MCG tablet Take 224 mcg by mouth 2 (two) times daily.        No current facility-administered medications for this visit.    Allergies as of 09/02/2013 - Review Complete 09/02/2013  Allergen Reaction Noted   . Penicillins Anaphylaxis 09/06/2011  . Morphine and related Itching 09/06/2011    Family History  Problem Relation Age of Onset  . Diabetes Father   . Other Neg Hx   . Colon cancer Neg Hx   . Pancreatic cancer Neg Hx   . Stomach cancer Neg Hx   . Endometriosis Mother   . Heart disease Maternal Grandmother   . Hypertension Maternal Grandmother   . Osteoporosis Maternal Grandmother   . Heart disease Maternal Grandfather   . Hypertension Maternal Grandfather   . Heart disease Paternal Grandmother     History   Social History  . Marital Status: Married    Spouse Name: N/A    Number of Children: 1  . Years of Education: N/A   Occupational History  . Student    Social History Main Topics  . Smoking status: Never Smoker   . Smokeless tobacco: Never Used  . Alcohol Use: No  . Drug Use: No  . Sexual Activity: No   Other Topics Concern  . Not on file   Social History Narrative  . No narrative on file      Physical Exam: BP 70/50  Pulse 104  Ht 5' 0.25" (1.53 m)  Wt 94 lb 2 oz (42.695 kg)  BMI 18.24 kg/m2  Breastfeeding? Yes Constitutional: generally well-appearing Psychiatric: alert and oriented x3 Abdomen: soft,  nontender, nondistended, no obvious ascites, no peritoneal signs, normal bowel sounds     Assessment and plan: [age] y.o. female with tortuous colon, status post very remote bowel resection resulting in ileocolon  anastomosis  She started cholestyramine powder about 2 months ago she is very happy with the results. She was x-ray having predominant diarrhea around that time and notices a big improvement. She has gone from 10 stools a day to about 2 stools a day. I have prescribed her hyoscyamine twice daily for antispasm which she has been bothered by since starting the cholestyramine. She will return to see me on an as-needed basis.

## 2013-09-02 NOTE — Patient Instructions (Signed)
New prescription for twice daily antispasm called in. Continue on cholestryamine powder, call if you run out. Return to see Dr. Christella HartiganJacobs on as needed bases.

## 2013-10-01 ENCOUNTER — Ambulatory Visit: Payer: Commercial Indemnity | Admitting: Dietician

## 2013-11-13 ENCOUNTER — Encounter: Payer: Self-pay | Admitting: Gynecology

## 2013-11-17 NOTE — Telephone Encounter (Signed)
See My Chart message from patient. LMP 10-20-13 but was lighter than normal and history of irregular cycles.    Please advise.

## 2013-11-18 ENCOUNTER — Telehealth: Payer: Self-pay | Admitting: Emergency Medicine

## 2013-11-18 ENCOUNTER — Encounter: Payer: Self-pay | Admitting: Gynecology

## 2013-11-18 ENCOUNTER — Ambulatory Visit (INDEPENDENT_AMBULATORY_CARE_PROVIDER_SITE_OTHER): Payer: 59 | Admitting: Gynecology

## 2013-11-18 VITALS — BP 110/76 | HR 92 | Resp 18 | Ht 60.25 in | Wt 96.0 lb

## 2013-11-18 DIAGNOSIS — N926 Irregular menstruation, unspecified: Secondary | ICD-10-CM

## 2013-11-18 DIAGNOSIS — Z3009 Encounter for other general counseling and advice on contraception: Secondary | ICD-10-CM

## 2013-11-18 DIAGNOSIS — N912 Amenorrhea, unspecified: Secondary | ICD-10-CM

## 2013-11-18 DIAGNOSIS — N736 Female pelvic peritoneal adhesions (postinfective): Secondary | ICD-10-CM

## 2013-11-18 LAB — POCT URINE PREGNANCY: Preg Test, Ur: NEGATIVE

## 2013-11-18 NOTE — Telephone Encounter (Signed)
REPORT Message [[id number]]    From [name] ChadKateria B Gammel   To Bennye Almracy H Lathrop, MD [P [id number]]   Composed 11/18/2013 4:00 PM   For Delivery On 11/18/2013 4:00 PM   Subject RE: Non-Urgent Medical Question   Message Type Patient Medical Advice Request   Read Status N   Message Body That was what I was concerned about I have been having alot of pain on my left side . I have had a miscarriage before so I was trying to be safe   ----- Message -----  From: Bennye AlmLATHROP,Micheala Morissette H, MD  Sent: 11/18/2013 3:31 PM EDT  To: [name] ChadKateria B [place]  Subject: RE: Non-Urgent Medical Question   [place]. I think we should do an u/s here due to the short time that the pill is affective and in addition because of your pelvic adhesions, you are at risk of ectopic pregnancy.  TL   ----- Message -----  From: [name] ChadPATTERSON,Danyale B  Sent: 11/16/2013 6:23 PM EDT  To: Bennye AlmLATHROP,Deaundre Allston H, MD  Subject: RE: Non-Urgent Medical Question   Is it possible to get a prescription for Mifeprex? I took an early result test last night and got a positive result and I honestly would not like to continue a pregnancy right now because of my health.   ----- Message -----  From: Bennye AlmLATHROP,Davone Shinault H, MD  Sent: 11/13/2013 3:12 PM EDT  To: [name] ChadKateria B [place]  Subject: RE: Non-Urgent Medical Question   Any reputable otc pregnancy test is fine, i would avoid those frm the dollar store and wait if you miss your cycle first.  TL   ----- Message -----  From: [name] ChadPATTERSON,Johanne B  Sent: 11/13/2013 1:20 PM EDT  To: Bennye AlmLATHROP,Slayter Moorhouse H, MD  Subject: RE: Non-Urgent Medical Question   I was not expecting pregnancy my last cycle was on April 21st but it was very light and short my hormones are so sporadic I just wanted to be sure it was just a short cycle instead of pregnancy but I had sex once and it was protected however the condom busted in the process of ending intercourse   ----- Message -----  From: Nurse  Kennis CarinaSarah S Y  Sent: 11/13/2013 12:31 PM EDT  To: [name] ChadKateria B [place]  Subject: RE: Non-Urgent Medical Question   Although we use a pregnancy test that is only distributed to medical offices, any of the over the counter tests is generally fine. When was your last cycle and were you attempting pregnancy? We would be happy to schedule and office visit for you and can do a test here to confirm.   Thank you,   Billie RuddySally Yeakley, RN   ----- Message -----  From: [name] ChadPATTERSON,Lasundra B  Sent: 11/13/2013 11:20 AM EDT  To: Bennye AlmLATHROP,Lysette Lindenbaum H, MD  Subject: Non-Urgent Medical Question   Good morning I was just wanting to ask a question about what Nida BoatmanBrad of urine pregnancy test you use In the office or if I should schedule an office blood hcg test just to be safe

## 2013-11-18 NOTE — Telephone Encounter (Signed)
Reached pt by phone after hours, she states that pain is on left and that it started 5d ago and she had light spotting on Saturday-4d ago.  Pt is not as bad as it was on Friday, she mostly feels it at night when she tries to sleep.  Pt took motrin with relief.  Pain is low back.  Pt has mild nasuea, no emesis, breast pain has resolved.  Pt seems not concerned, i have asked her for to come in to be assessed, she is on her way. 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

## 2013-11-18 NOTE — Telephone Encounter (Signed)
Message left to return call to Lockettracy at [phone] on mobile.   Mychart message left and advised to call our office or on call doctor as soon as possible as patient needs to be evaluated.   Routing to Dr. Farrel GobbleLathrop and cc Dr. Edward JollySilva (on call provider)

## 2013-11-19 ENCOUNTER — Encounter: Payer: Self-pay | Admitting: Gynecology
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
## 2013-11-19 NOTE — Telephone Encounter (Signed)
Patient was evaluated by Dr. Farrel GobbleLathrop 11/18/13. Will close encounter.  Routing to provider for final review. Patient agreeable to disposition. Will close encounter

## 2013-11-24 ENCOUNTER — Telehealth: Payer: Self-pay

## 2013-11-24 NOTE — Telephone Encounter (Signed)
Spoke with patient regarding mychart message with Dr.Lathrop about IUD insertion. Patient states that she would like to schedule insertion with this cycle which begin yesterday. Appointment scheduled for tomorrow at 6:30pm with Dr.Lathrop. Pre procedure instructions given.  Motrin instructions given. Motrin=Advil=Ibuprofen, 800 mg one hour before appointment. Eat a meal and hydrate well before appointment. Advised of benefits and OOP cost for insertion.Patient agreeable and verbalizes understanding.  Routing to provider for final review. Patient agreeable to disposition. Will close encounter

## 2013-11-25 ENCOUNTER — Ambulatory Visit (INDEPENDENT_AMBULATORY_CARE_PROVIDER_SITE_OTHER): Payer: 59 | Admitting: Gynecology

## 2013-11-25 ENCOUNTER — Encounter: Payer: Self-pay | Admitting: Gynecology

## 2013-11-25 VITALS — BP 94/60 | HR 60 | Resp 16 | Ht 60.25 in | Wt 96.0 lb

## 2013-11-25 DIAGNOSIS — Z30430 Encounter for insertion of intrauterine contraceptive device: Secondary | ICD-10-CM

## 2013-11-25 DIAGNOSIS — Z3009 Encounter for other general counseling and advice on contraception: Secondary | ICD-10-CM

## 2013-11-25 NOTE — Patient Instructions (Signed)

## 2013-11-25 NOTE — Progress Notes (Signed)
23 yrsMarriedAfrican Americanfemale presents for  insertion of Mirena. Denies any vaginal symptoms or STD concerns.  LMP: 11/22/13  Patient read information regarding IUD insertion.  All questions addressed.    Healthy female,time, place and person Abdomen: soft, non-tender [place] inguinal nodes palpated  Pelvic exam: Vulva;normal female genitalia  Vagina:normal vagina, no discharge, exudate, lesion, or erythema  Cervix:Non-tender, Negative CMT, no lesions or redness, nulliparous/parous os  Uterus:normal shape, position and consistency     Procedure:  Speculum inserted into vagina. Cervix visualized and cleansed with betadine solution X 3. Tenaculum placed on cervix at 6 o'clock position(s).  Uterus sounded to 7 centimeters.  IUD removed from sterile packet and under sterile conditions inserted to fundus of uterus.  Introducer removed without difficulty.  IUD string trimmed to 3 centimeters.  Remainder string given to patient to feel for identification.  Tenaculum removed.  No bleeding noted.  Speculum removed.  Uterus palpated normal.  Patient tolerated procedure well.  A: Insertion of Mirena, Lot # [id number], Expiration date 8/17   P:  Instructions and warnings signs given.       IUD identification card given with IUD removal 10/2018       Return visit 56M

## 2013-12-16 ENCOUNTER — Telehealth: Payer: Self-pay | Admitting: Gynecology

## 2013-12-16 NOTE — Telephone Encounter (Signed)
Need to move patients appt up in the day to 1:30 or 2:30 whichever is not taken. Patient has been called just waiting on a call back.

## 2013-12-17 NOTE — Telephone Encounter (Signed)
Patient called to confirm she will be here at 2:30 12/28/13.

## 2013-12-28 ENCOUNTER — Ambulatory Visit (INDEPENDENT_AMBULATORY_CARE_PROVIDER_SITE_OTHER): Payer: Managed Care, Other (non HMO) | Admitting: Gynecology

## 2013-12-28 ENCOUNTER — Ambulatory Visit: Payer: Managed Care, Other (non HMO) | Admitting: Gynecology

## 2013-12-28 ENCOUNTER — Encounter: Payer: Self-pay | Admitting: Gynecology

## 2013-12-28 VITALS — BP 94/60 | Resp 12 | Ht 60.25 in | Wt 93.0 lb

## 2013-12-28 DIAGNOSIS — Z30431 Encounter for routine checking of intrauterine contraceptive device: Secondary | ICD-10-CM

## 2013-12-28 DIAGNOSIS — N949 Unspecified condition associated with female genital organs and menstrual cycle: Secondary | ICD-10-CM

## 2013-12-28 DIAGNOSIS — N938 Other specified abnormal uterine and vaginal bleeding: Secondary | ICD-10-CM

## 2013-12-28 NOTE — Progress Notes (Signed)
Pt here 5182m after placing mirena IUD for contraception.  Pt reports mild cramping for 2w, not improving. Did not try NSAIDS.  No recent sex.  Bleeding for 2w-pads, changing very 3h, clots first few days.  ROS: as above  BP 94/60  Resp 12  Ht 5' 0.25" (1.53 m)  Wt 93 lb (42.185 kg)  BMI 18.02 kg/m2  LMP 12/12/2013 General appearance: alert, cooperative and appears stated age Abdomen: soft non-sepcific left lower quadrant tenderness  Pelvic: External genitalia:  no lesions              Urethra:  normal appearing urethra with no masses, tenderness or lesions              Bartholins and Skenes: normal                 Vagina: normal appearing vagina with normal color and discharge, no lesions              Cervix: normal appearance, strings noted                      Bimanual Exam:  Uterus:  uterus is normal size, shape, consistency and nontender                                      Adnexa: normal adnexa in size, nontender and no masses     Assessment: IUD f/u exam Left lower quadrant pain  Plan: PUS for assurance CBC Doing well

## 2013-12-29 ENCOUNTER — Encounter: Payer: Commercial Indemnity | Attending: Family Medicine | Admitting: Dietician

## 2013-12-29 ENCOUNTER — Telehealth: Payer: Self-pay

## 2013-12-29 ENCOUNTER — Telehealth: Payer: Self-pay | Admitting: Gynecology

## 2013-12-29 VITALS — Ht 60.0 in | Wt 92.5 lb

## 2013-12-29 DIAGNOSIS — R634 Abnormal weight loss: Secondary | ICD-10-CM | POA: Diagnosis not present

## 2013-12-29 DIAGNOSIS — K909 Intestinal malabsorption, unspecified: Secondary | ICD-10-CM | POA: Insufficient documentation

## 2013-12-29 DIAGNOSIS — Z713 Dietary counseling and surveillance: Secondary | ICD-10-CM | POA: Diagnosis not present

## 2013-12-29 LAB — CBC
HCT: 36.9 % (ref 36.0–46.0)
Hemoglobin: 12.2 g/dL (ref 12.0–15.0)
MCH: 28.2 pg (ref 26.0–34.0)
MCHC: 33.1 g/dL (ref 30.0–36.0)
MCV: 85.2 fL (ref 78.0–100.0)
Platelets: 230 10*3/uL (ref 150–400)
RBC: 4.33 MIL/uL (ref 3.87–5.11)
RDW: 14.3 % (ref 11.5–15.5)
WBC: 5.3 10*3/uL (ref 4.0–10.5)

## 2013-12-29 NOTE — Progress Notes (Signed)
Medical Nutrition Therapy:  Appt start time: 1130 end time:  1200  Follow up:  Primary concerns today:  [name] returns today with a 3.5 pound weight loss since May 2015. She states she has been trying to eat more and her thyroid has "leveled out". However, in the past week her diarrhea has been more severe. She reports that she is working on getting more protein in like beans. She does not like to eat meat and cannot tolerate egg yolks. The patient reports she has been cooking with coconut oil and is tolerating that well.    Wt Readings from Last 3 Encounters:  12/29/13 92 lb 8 oz (41.958 kg)  12/28/13 93 lb (42.185 kg)  11/25/13 96 lb (43.545 kg)   Ht Readings from Last 3 Encounters:  12/29/13 5' (1.524 m)  12/28/13 5' 0.25" (1.53 m)  11/25/13 5' 0.25" (1.53 m)   Body mass index is 18.07 kg/(m^2). @BMIFA @ Normalized weight-for-age data available only for age 69 to 20 years. Normalized stature-for-age data available only for age 69 to 20 years.   MEDICATIONS: see list.    DIETARY INTAKE: Usual eating pattern includes 3 meals and 2 snacks per day. Everyday foods include egg whites, fruit, rice.  Avoided foods include pork, spicy foods, cabbage, high-fat, high sugar. (causes dumping syndrome)    24-hr recall:  B ( AM): egg whites (5) with OJ, sometimes bacon and sausage  Snk ( AM): fruit- nectarines, bananas (most common)  L ( PM): rice with mix vegetable and shrimp. With lemonade  Snk ( PM): none D ( PM): meat and rice or pasta with vegetables; juice or sweet decaf tea. Snk ( PM): sherbet or popsicle   Beverages: fruit juices, sweet tea decaf, water with lemon Pt notes that starchy foods tend to increase fullness more than anything else.  Usual physical activity: yoga and stretches, walking 3x a week, play with child.   Progress Towards Goal(s):  In progress.   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  As evidenced by pt c/o diarrhea, struggles to maintain weight.    Intervention:  Nutrition counseling provided regarding prevention of diarrhea. Encouraged protein intake at each meal or snack.    Samples provided and patient instructed on proper use: Premier protein shake (vanilla - qty 2) Lot#: [id number] Exp: 04/2014  Teaching Method Utilized:  Visual Auditory  Handouts given during visit include:  Vegetarian proteins  Barriers to learning/adherence to lifestyle change: preference for many fruits and vegetables, distaste for many proteins.   Demonstrated degree of understanding via:  Teach Back   Monitoring/Evaluation:  Dietary intake and body weight in 4 month(s).

## 2013-12-29 NOTE — Telephone Encounter (Signed)
Spoke with patient. Advised that per benefit quote received, she will be responsible to pay $278.10 when she comes in for her PUS. Patient stated that she would not be able to pay that amount next week. She asked that the appointment be cancelled and stated that she will call back to reschedule.

## 2013-12-29 NOTE — Telephone Encounter (Signed)
Message copied by Jannet AskewHINES, KAITLYN E on Tue Dec 29, 2013 11:08 AM ------      Message from: Douglass RiversLATHROP, TRACY      Created: Tue Dec 29, 2013  9:44 AM       Cbc is perfect! Normal white count, should keep u/s if pain persists ------

## 2013-12-29 NOTE — Telephone Encounter (Signed)
Routing to Dr.Lathrop as FYI. Patient cancelled ultrasound appointment due to cost.Previous telephone encounter seen below.   Jannet AskewKaitlyn E Hines, RN at 12/29/2013 11:09 AM     Status: Signed        Spoke with patient. Advised of message from Dr.Lathrop as seen below. Patient states that the cramping and pain is still present. Advised will need to schedule ultrasound for further evaluation. Ultrasound scheduled for 7/7 at 3:30pm with 4:00pm consult with Dr.Lathrop. Patient agreeable to date and time. Patient states that Dr.Lathrop told her to take ibuprofen but can not remember how much. Advised patient to take 800mg  of ibuprofen for relief. Advised if pain increases or new symptoms arise will need to be seen sooner or by ER. Patient agreeable and verbalizes understanding. Advised billing department will be in contact with patient regarding coverage and benefits for PUS.  Routing to provider for final review. Patient agreeable to disposition. Will close encounter   Routing to provider for final review. Patient agreeable to disposition. Will close encounter

## 2013-12-29 NOTE — Patient Instructions (Addendum)
Goals:  -Increase protein foods: nuts and seeds, beans, soy yogurt, soymilk, seafood, cheese -Add protein powder to foods/drinks -Add egg whites to afternoon snack -Continue cooking with coconut oil

## 2013-12-29 NOTE — Telephone Encounter (Signed)
Spoke with patient. Advised of message from Dr.Lathrop as seen below. Patient states that the cramping and pain is still present. Advised will need to schedule ultrasound for further evaluation. Ultrasound scheduled for 7/7 at 3:30pm with 4:00pm consult with Dr.Lathrop. Patient agreeable to date and time. Patient states that Dr.Lathrop told her to take ibuprofen but can not remember how much. Advised patient to take 800mg  of ibuprofen for relief. Advised if pain increases or new symptoms arise will need to be seen sooner or by ER. Patient agreeable and verbalizes understanding. Advised billing department will be in contact with patient regarding coverage and benefits for PUS.  Routing to provider for final review. Patient agreeable to disposition. Will close encounter

## 2014-01-05 ENCOUNTER — Other Ambulatory Visit: Payer: Managed Care, Other (non HMO)

## 2014-01-05 ENCOUNTER — Other Ambulatory Visit: Payer: Managed Care, Other (non HMO) | Admitting: Gynecology

## 2014-01-12 ENCOUNTER — Encounter: Payer: Self-pay | Admitting: Gynecology

## 2014-03-14 ENCOUNTER — Encounter: Payer: Self-pay | Admitting: Gynecology

## 2014-03-16 ENCOUNTER — Telehealth: Payer: Self-pay | Admitting: *Deleted

## 2014-03-16 ENCOUNTER — Encounter: Payer: Managed Care, Other (non HMO) | Admitting: Gynecology

## 2014-03-16 ENCOUNTER — Telehealth: Payer: Self-pay | Admitting: Emergency Medicine

## 2014-03-16 ENCOUNTER — Encounter: Payer: Self-pay | Admitting: Gynecology

## 2014-03-16 NOTE — Telephone Encounter (Signed)
Visit Follow-Up Question 03/14/2014 5:45 PM Reply  To: [facility]   From: [name]   Created: 03/14/2014 5:45 PM    *-*-*This message has not been handled.*-*-*   Hello I was just wondering if the cramping and stomach spasms should stop now that it has been over a month of the mirena because along with the uterine pain I have been having ovary pain on my right side along with nausea and headaches and back and leg pain it almost feels the same as round ligament pain during pregnancy and phantom like movements and of course I know I'm not pregnant.

## 2014-03-16 NOTE — Telephone Encounter (Signed)
Message left to return call to Annya Lizana at [phone].    

## 2014-03-16 NOTE — Telephone Encounter (Signed)
Patient was here for visit today had to leave due to work said if we could call her and reschedule. Scheduled her for 03/19/14 at 10:30 with Dr. Farrel Gobble. LM on patient's vm regarding this and to call us back if this time does not work best for her.

## 2014-03-16 NOTE — Telephone Encounter (Signed)
Spoke with patient. Patient states that she has been experiencing cramping that has been getting worse. Patient also has pain on her right side. Has abdominal bloating and her stomach is "hard." "The pain feels like contractions. I am nauseous and have headaches." States that her pain is an 8/10 today. Patient has to be to work today at 1:30pm today. Has to be at work at 11:30am tomorrow. Advised patient will need to be seen and that I will talk with Dr.Lathrop about best appointment date and time and give her a call back. Patient agreeable.

## 2014-03-16 NOTE — Telephone Encounter (Signed)
Please see telephone encounter.  Message left to return call to Frank at [phone].

## 2014-03-16 NOTE — Telephone Encounter (Signed)
Spoke with patient. Advised spoke with Dr.Lathrop who would like patient to be seen today. Advised patient can be seen today at 12pm per Dr.Lathrop. Patient agreeable to date and time.  Routing to provider for final review. Patient agreeable to disposition. Will close encounter

## 2014-03-19 ENCOUNTER — Telehealth: Payer: Self-pay | Admitting: Gynecology

## 2014-03-19 ENCOUNTER — Ambulatory Visit (INDEPENDENT_AMBULATORY_CARE_PROVIDER_SITE_OTHER): Payer: Managed Care, Other (non HMO) | Admitting: Gynecology

## 2014-03-19 ENCOUNTER — Other Ambulatory Visit: Payer: Self-pay | Admitting: *Deleted

## 2014-03-19 ENCOUNTER — Encounter: Payer: Self-pay | Admitting: Gynecology

## 2014-03-19 VITALS — BP 102/60 | Resp 12 | Ht 60.0 in | Wt 100.0 lb

## 2014-03-19 DIAGNOSIS — Z975 Presence of (intrauterine) contraceptive device: Secondary | ICD-10-CM

## 2014-03-19 DIAGNOSIS — N949 Unspecified condition associated with female genital organs and menstrual cycle: Secondary | ICD-10-CM

## 2014-03-19 DIAGNOSIS — N736 Female pelvic peritoneal adhesions (postinfective): Secondary | ICD-10-CM

## 2014-03-19 DIAGNOSIS — R102 Pelvic and perineal pain: Principal | ICD-10-CM

## 2014-03-19 DIAGNOSIS — G8929 Other chronic pain: Secondary | ICD-10-CM

## 2014-03-19 DIAGNOSIS — N9489 Other specified conditions associated with female genital organs and menstrual cycle: Secondary | ICD-10-CM

## 2014-03-19 NOTE — Telephone Encounter (Signed)
Left message for patient to call back. Need to go over benefits for PUS scheduled 09.24.2015.  PR: $55.62.  Also need to advise of consult 09.25.2015 @ 1100.

## 2014-03-19 NOTE — Telephone Encounter (Signed)
Message copied by Vangie Bicker on Fri Mar 19, 2014 12:26 PM ------      Message from: Alisa Graff      Created: Fri Mar 19, 2014 11:23 AM      Regarding: pus thurs       whne you call her with benefits, can you tell her I scheduled her consult with Dr Farrel Gobble for the next day 03-26-14 at 11 to discuss results. ------

## 2014-03-21 NOTE — Progress Notes (Signed)
Subjective:     Patient ID: [name], female   DOB: 1991-04-30, 23 y.o.   MRN: [id number]  HPI Comments: Pt here reporting pelvic pain that is sharp, shooting towards her hip.  No associated nausea or vomiting.  Pt states that she has had irregular bleeding since the IUD was placed.   She reports the pain is similar to when she was here a few months ago, pt did not come for her PUS at that time.  Pelvic Pain The patient's primary symptoms include pelvic pain. The patient's pertinent negatives include no vaginal discharge. Pertinent negatives include no chills, fever or hematuria.     Review of Systems  Constitutional: Negative for fever, chills and diaphoresis.  Genitourinary: Positive for vaginal bleeding, pelvic pain and dyspareunia. Negative for hematuria, vaginal discharge and difficulty urinating.       Objective:   Physical Exam  Constitutional: She appears well-developed and well-nourished.  Genitourinary: Vagina normal. There is no rash or tenderness on the right labia. There is no rash or tenderness on the left labia. Uterus is tender. Cervix exhibits no motion tenderness and no discharge. Right adnexum displays no mass and no tenderness. Left adnexum displays no mass and no tenderness. No tenderness around the vagina. No vaginal discharge found.  IUD strings noted Uterus anteflexed and limited mobility, tender?  Lymphadenopathy:       Right: No inguinal adenopathy present.       Left: No inguinal adenopathy present.       Assessment:     Pelvic pain with IUD in place and extensive pelvic adhesions     Plan:     PUS otc motrin as needed Pt declines other medications

## 2014-03-22 NOTE — Telephone Encounter (Signed)
Patient was seen 03/19/14 for OV   Encounter closed.

## 2014-03-22 NOTE — Telephone Encounter (Signed)
° °  Left message for patient to call back. Need to go over benefits for PUS scheduled 09.24.2015. PR: $55.62. Also need to advise of consult 09.25.2015 @ 1100.

## 2014-03-23 NOTE — Telephone Encounter (Signed)
Patient returned call. Advised that per benefit quote received, she will be responsible for $55.62 when she comes in 09.24.2015 for PUS. Also advised that she is scheduled for a consult w/Dr Farrel Gobble on Friday, 09.25.2015 @ 1100. Patient agreeable.

## 2014-03-23 NOTE — Progress Notes (Signed)
Patient ID: [name], female   DOB: [birth year], 23 y.o.   MRN: [id number] Error, pt not seen

## 2014-03-25 ENCOUNTER — Ambulatory Visit (INDEPENDENT_AMBULATORY_CARE_PROVIDER_SITE_OTHER): Payer: Managed Care, Other (non HMO)

## 2014-03-25 DIAGNOSIS — N9489 Other specified conditions associated with female genital organs and menstrual cycle: Secondary | ICD-10-CM

## 2014-03-26 ENCOUNTER — Ambulatory Visit (INDEPENDENT_AMBULATORY_CARE_PROVIDER_SITE_OTHER): Payer: Managed Care, Other (non HMO) | Admitting: Gynecology

## 2014-03-26 ENCOUNTER — Encounter: Payer: Self-pay | Admitting: Gynecology

## 2014-03-26 VITALS — BP 104/70 | HR 68 | Resp 16 | Ht 60.0 in | Wt 99.0 lb

## 2014-03-26 DIAGNOSIS — Z8639 Personal history of other endocrine, nutritional and metabolic disease: Secondary | ICD-10-CM

## 2014-03-26 DIAGNOSIS — R102 Pelvic and perineal pain unspecified side: Secondary | ICD-10-CM

## 2014-03-26 DIAGNOSIS — Z862 Personal history of diseases of the blood and blood-forming organs and certain disorders involving the immune mechanism: Secondary | ICD-10-CM

## 2014-03-26 DIAGNOSIS — N736 Female pelvic peritoneal adhesions (postinfective): Secondary | ICD-10-CM

## 2014-03-26 DIAGNOSIS — K909 Intestinal malabsorption, unspecified: Secondary | ICD-10-CM

## 2014-03-26 DIAGNOSIS — G8929 Other chronic pain: Secondary | ICD-10-CM

## 2014-03-26 DIAGNOSIS — M3500 Sicca syndrome, unspecified: Secondary | ICD-10-CM

## 2014-03-26 DIAGNOSIS — N949 Unspecified condition associated with female genital organs and menstrual cycle: Secondary | ICD-10-CM

## 2014-03-26 DIAGNOSIS — Z975 Presence of (intrauterine) contraceptive device: Secondary | ICD-10-CM
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
## 2014-03-29 NOTE — Addendum Note (Signed)
Addended by: Lorraine Lax on: 03/29/2014 10:27 AM   Modules accepted: Orders

## 2014-03-29 NOTE — Addendum Note (Signed)
Addended by: Lorraine Lax on: 03/29/2014 09:36 AM   Modules accepted: Orders

## 2014-03-29 NOTE — Addendum Note (Signed)
Addended by: Lorraine Lax on: 03/29/2014 08:16 AM   Modules accepted: Orders

## 2014-04-30 ENCOUNTER — Encounter: Payer: Commercial Indemnity | Attending: Gynecology | Admitting: Dietician

## 2014-04-30 VITALS — Wt 99.5 lb

## 2014-04-30 DIAGNOSIS — R636 Underweight: Secondary | ICD-10-CM

## 2014-04-30 DIAGNOSIS — Z713 Dietary counseling and surveillance: Secondary | ICD-10-CM | POA: Insufficient documentation

## 2014-04-30 NOTE — Patient Instructions (Addendum)
Goals:  -Increase protein foods: nuts and seeds, beans, soy yogurt, unsweetened soymilk, seafood, soy nuts, eggs, meat -Add cheese to eggs and grits, etc -Add protein powder to foods/drinks -Add egg whites to afternoon snack -Continue cooking with coconut oil -Keep high-protein snacks in your car/purse: nuts, protein shakes, [employer] bar -Protein goal: 100-150 grams per day -Reduce foods that cause cramping and dumping syndrome: juice and other concentrated sweets

## 2014-04-30 NOTE — Progress Notes (Signed)
Medical Nutrition Therapy:  Appt start time: 1025 end time: 1055  Follow up:  Primary concerns today:    [name] returns with her boyfriend having maintained her weight. 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 She recently began taking more thyroid medications. She has been making fruit smoothies with protein powder and trying to eat more beans and nuts. She reports that she usually eats about 4x a day.    Wt Readings from Last 3 Encounters:  03/26/14 99 lb (44.906 kg)  03/19/14 100 lb (45.36 kg)  03/16/14 99 lb (44.906 kg)   Ht Readings from Last 3 Encounters:  03/26/14 5' (1.524 m)  03/19/14 5' (1.524 m)  03/16/14 5' (1.524 m)   There is no weight on file to calculate BMI. @BMIFA @ Normalized weight-for-age data available only for age 8 to 20 years. Normalized stature-for-age data available only for age 8 to 20 years.   MEDICATIONS: see list.    DIETARY INTAKE: Usual eating pattern includes 3 meals and 2 snacks per day. Everyday foods include egg whites, fruit, rice.  Avoided foods include pork, spicy foods, cabbage, high-fat, high sugar. (causes dumping syndrome), lactose intolerant    24-hr recall:  B ( AM): egg whites (5) with OJ and bacon and sausage  Snk ( AM): fruit- nectarines, bananas (most common)  L ( PM): chicken noodle soup  Snk ( PM): fries D ( PM): shrimp with rice and broccoli, carrots, onions, and zucchini Snk ( PM): none   Beverages: fruit juices, sweet tea decaf, water with lemon Pt notes that starchy foods tend to increase fullness more than anything else.  Usual physical activity: yoga and stretches, walking 3x a week, play with child.   Progress Towards Goal(s):  In progress.   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  As evidenced by pt c/o diarrhea, struggles to maintain weight.    Intervention:  Nutrition counseling provided regarding  prevention of diarrhea. Encouraged protein intake at each meal or snack.     Teaching Method Utilized:  Visual Auditory  Barriers to learning/adherence to lifestyle change: preference for many fruits and vegetables, distaste for many proteins.   Demonstrated degree of understanding via:  Teach Back   Monitoring/Evaluation:  Dietary intake and body weight in 4 month(s).

## 2014-05-03 ENCOUNTER — Encounter: Payer: Self-pay | Admitting: Gynecology

## 2014-05-31 ENCOUNTER — Telehealth: Payer: Self-pay

## 2014-05-31 NOTE — Telephone Encounter (Signed)
Called to reschedule AEX with Dr. Farrel GobbleLathrop. Her appt is 08/03/14 with Ms. Patty. Encounter closed

## 2014-06-04 ENCOUNTER — Encounter: Payer: Self-pay | Admitting: Nurse Practitioner

## 2014-06-04 ENCOUNTER — Ambulatory Visit (INDEPENDENT_AMBULATORY_CARE_PROVIDER_SITE_OTHER): Payer: Managed Care, Other (non HMO) | Admitting: Nurse Practitioner

## 2014-06-04 ENCOUNTER — Telehealth: Payer: Self-pay | Admitting: Gynecology

## 2014-06-04 VITALS — BP 110/80 | HR 96 | Temp 98.8°F | Resp 16 | Ht 60.0 in | Wt 97.0 lb

## 2014-06-04 DIAGNOSIS — R300 Dysuria: Secondary | ICD-10-CM

## 2014-06-04 LAB — POCT URINALYSIS DIPSTICK
Bilirubin, UA: NEGATIVE
Glucose, UA: NEGATIVE
KETONES UA: NEGATIVE
Nitrite, UA: NEGATIVE
UROBILINOGEN UA: NEGATIVE
pH, UA: 6

## 2014-06-04 MED ORDER — CIPROFLOXACIN HCL 500 MG PO TABS: 500.0000 mg | ORAL_TABLET | Freq: Two times a day (BID) | ORAL | Status: DC

## 2014-06-04 MED ORDER — PHENAZOPYRIDINE HCL 200 MG PO TABS
200.0000 mg | ORAL_TABLET | Freq: Three times a day (TID) | ORAL | Status: DC | PRN
Start: 2014-06-04 — End: 2014-09-27

## 2014-06-04 NOTE — Telephone Encounter (Signed)
Patient is passing blood clots and would like painful urination.

## 2014-06-04 NOTE — Patient Instructions (Signed)

## 2014-06-04 NOTE — Telephone Encounter (Signed)
Spoke with patient. Patient states that a couple of days ago she did not have any urge to use the restroom and "All of a sudden I peed my pants. I changed and just went on. Then it felt like I couldn't use the bathroom and now I am going all the time." Patient is having pain after urination. Went to the restroom today and has blood with urination and with wiping. Slight abdominal cramping. Denies back pain, fevers, and chills. LMP was on 11/20. Advised patient will need to be seen for evaluation today. Patient is agreeable. Appointment scheduled for today at 1:45pm with Jamie FranklinPatricia Rolen-Grubb, FNP. Patient is agreeable to date and time.  Routing to provider for final review. Patient agreeable to disposition. Will close encounter

## 2014-06-04 NOTE — Progress Notes (Signed)
Subjective:     Patient ID: [name], female   DOB: June 02, 1991, 23 y.o.   MRN: [id number]  HPI  This 23 yo SAA female with history of hematuria and dysuria since Tuesday.   Achy pain across the lower suprapubic area.  She thought it was flare fibromyalgia.  No fever or chills, N/V.   Still able to work without problems. Also complains of constipation, with last BM on Sunday. Mirena  IUD since 10/2013. Menses normally last 6 days but lighter vs. 2 weeks of heavy off the IUD.   LMP 05/21/14.   Not SA since September 16 th. No change in partner.   Partner is upset as they recently broke up.  He then comes to her home last Wednesday and gets her 25 year old daughter and 'hides' her.  Patient got police involved and she got her daughter back home on Monday.  The daughter is now in the care of her parents.  With the emotional upset she thinks this has started the urinary symptoms.  She has never passed a kidney stone - but did have a renal stone found on CT scan 03/2013.   Review of Systems  Constitutional: Positive for appetite change. Negative for fever, chills, diaphoresis and fatigue.  Respiratory: Negative.   Gastrointestinal: Positive for constipation. Negative for nausea, vomiting, abdominal pain, diarrhea and blood in stool.  Genitourinary: Positive for dysuria, urgency, frequency, hematuria and dyspareunia.  Musculoskeletal: Negative.   Skin: Negative.   Neurological: Negative.   Psychiatric/Behavioral: Negative.        Objective:   Physical Exam  Constitutional: She is oriented to person, place, and time. She appears well-developed and well-nourished. No distress.  Abdominal: Soft. She exhibits no distension. There is no tenderness. There is no rebound and no guarding.  No flank pain.  Genitourinary:  Normal vaginal discharge.  IUD strings are visible.   Neurological: She is alert and oriented to person, place, and time.  Psychiatric: She has a normal mood and affect. Her  behavior is normal. Thought content normal.       Assessment:     R/O UTI History of Mirena IUD 10/2013 History of recent emotional stressors History of Sjogren's disease, Graves disease Normal history of diarrhea - now constipation Previous abdominal CT with findings of renal calculi     Plan:     Will follow with urine C&S Will start on Cipro 500 mg BID and Pyridium 200 mg prn If symptoms worsens to go to ED, she will observe for any stone that she may pass - do not have a urine strainer here.

## 2014-06-05 LAB — URINALYSIS, MICROSCOPIC ONLY
Casts: NONE SEEN
Crystals: NONE SEEN
Squamous Epithelial / LPF: NONE SEEN

## 2014-06-06 NOTE — Progress Notes (Signed)
Encounter reviewed by Dr. Brook Silva.  

## 2014-06-07 ENCOUNTER — Emergency Department (HOSPITAL_COMMUNITY)
Admission: EM | Admit: 2014-06-07 | Discharge: 2014-06-08 | Disposition: A | Discharge status: Home / Self Care | Payer: Commercial Indemnity | Attending: Emergency Medicine | Admitting: Emergency Medicine

## 2014-06-07 ENCOUNTER — Encounter (HOSPITAL_COMMUNITY): Payer: Self-pay | Admitting: Emergency Medicine

## 2014-06-07 DIAGNOSIS — Z8719 Personal history of other diseases of the digestive system: Secondary | ICD-10-CM | POA: Diagnosis not present

## 2014-06-07 DIAGNOSIS — E039 Hypothyroidism, unspecified: Secondary | ICD-10-CM | POA: Insufficient documentation

## 2014-06-07 DIAGNOSIS — Z8679 Personal history of other diseases of the circulatory system: Secondary | ICD-10-CM | POA: Insufficient documentation

## 2014-06-07 DIAGNOSIS — Z880 Allergy status to penicillin: Secondary | ICD-10-CM | POA: Insufficient documentation

## 2014-06-07 DIAGNOSIS — Z3202 Encounter for pregnancy test, result negative: Secondary | ICD-10-CM | POA: Diagnosis not present

## 2014-06-07 DIAGNOSIS — R110 Nausea: Secondary | ICD-10-CM

## 2014-06-07 DIAGNOSIS — R319 Hematuria, unspecified: Secondary | ICD-10-CM | POA: Diagnosis present

## 2014-06-07 DIAGNOSIS — N390 Urinary tract infection, site not specified: Secondary | ICD-10-CM | POA: Diagnosis not present

## 2014-06-07 DIAGNOSIS — Z872 Personal history of diseases of the skin and subcutaneous tissue: Secondary | ICD-10-CM | POA: Diagnosis not present

## 2014-06-07 DIAGNOSIS — Z8739 Personal history of other diseases of the musculoskeletal system and connective tissue: Secondary | ICD-10-CM | POA: Diagnosis not present

## 2014-06-07 DIAGNOSIS — Z79899 Other long term (current) drug therapy: Secondary | ICD-10-CM | POA: Diagnosis not present

## 2014-06-07 HISTORY — DX: Vitiligo: L80

## 2014-06-07 HISTORY — DX: Systemic lupus erythematosus, unspecified: M32.9

## 2014-06-07 HISTORY — DX: Reserved for concepts with insufficient information to code with codable children: IMO0002

## 2014-06-07 LAB — WET PREP, GENITAL
Clue Cells Wet Prep HPF POC: NONE SEEN
TRICH WET PREP: NONE SEEN
YEAST WET PREP: NONE SEEN

## 2014-06-07 LAB — URINALYSIS, ROUTINE W REFLEX MICROSCOPIC
GLUCOSE, UA: NEGATIVE mg/dL
Ketones, ur: NEGATIVE mg/dL
Nitrite: POSITIVE — AB
PH: 6 (ref 5.0–8.0)
Protein, ur: >300 mg/dL — AB
Specific Gravity, Urine: 1.023 (ref 1.005–1.030)
Urobilinogen, UA: 1 mg/dL (ref 0.0–1.0)

## 2014-06-07 LAB — CBC WITH DIFFERENTIAL/PLATELET
Basophils Absolute: 0 10*3/uL (ref 0.0–0.1)
Basophils Relative: 0 % (ref 0–1)
Eosinophils Absolute: 0.1 10*3/uL (ref 0.0–0.7)
Eosinophils Relative: 2 % (ref 0–5)
HCT: 39.3 % (ref 36.0–46.0)
Hemoglobin: 12.6 g/dL (ref 12.0–15.0)
LYMPHS ABS: 2.3 10*3/uL (ref 0.7–4.0)
LYMPHS PCT: 40 % (ref 12–46)
MCH: 27.4 pg (ref 26.0–34.0)
MCHC: 32.1 g/dL (ref 30.0–36.0)
MCV: 85.4 fL (ref 78.0–100.0)
Monocytes Absolute: 0.3 10*3/uL (ref 0.1–1.0)
Monocytes Relative: 5 % (ref 3–12)
NEUTROS PCT: 53 % (ref 43–77)
Neutro Abs: 3.1 10*3/uL (ref 1.7–7.7)
Platelets: 259 10*3/uL (ref 150–400)
RBC: 4.6 MIL/uL (ref 3.87–5.11)
RDW: 16.5 % — ABNORMAL HIGH (ref 11.5–15.5)
WBC: 5.8 10*3/uL (ref 4.0–10.5)

## 2014-06-07 LAB — BASIC METABOLIC PANEL
Anion gap: 14 (ref 5–15)
BUN: 9 mg/dL (ref 6–23)
CHLORIDE: 101 meq/L (ref 96–112)
CO2: 21 mEq/L (ref 19–32)
Calcium: 10.1 mg/dL (ref 8.4–10.5)
Creatinine, Ser: 0.84 mg/dL (ref 0.50–1.10)
GFR calc non Af Amer: >90 mL/min (ref >90)
Glucose, Bld: 98 mg/dL (ref 70–99)
POTASSIUM: 3.7 meq/L (ref 3.7–5.3)
Sodium: 136 mEq/L — ABNORMAL LOW (ref 137–147)

## 2014-06-07 LAB — URINE CULTURE: Colony Count: >=100000

## 2014-06-07 LAB — PREGNANCY, URINE: Preg Test, Ur: NEGATIVE

## 2014-06-07 LAB — URINE MICROSCOPIC-ADD ON

## 2014-06-07 MED ORDER — OXYCODONE-ACETAMINOPHEN 5-325 MG PO TABS
2.0000 | ORAL_TABLET | Freq: Once | ORAL | Status: AC
  Administered 2014-06-07: 2 via ORAL
  Filled 2014-06-07: qty 2

## 2014-06-07 NOTE — ED Notes (Signed)
Pt with Hx of lupus c/o groin/ urinary tract pain states she had a flare-up last week, states on Monday she was unable to urinate or defecate, on Tuesday she had urinary incontinence with some blood clots exiting the urethra, pt's PCP told her that there was no urinary infection but that the blood seemed to be coming from her kidneys s/t the lupus flare-up. Pt states her left foot has also been numb since last week.

## 2014-06-07 NOTE — ED Notes (Signed)
Patient smiling while giving her pain level.

## 2014-06-07 NOTE — ED Notes (Signed)
Provider at bedside

## 2014-06-07 NOTE — ED Provider Notes (Signed)
CSN: [id number]     Arrival date & time 06/07/14  1809 History   First MD Initiated Contact with Patient 06/07/14 2134     Chief Complaint  Patient presents with  . Hematuria  . Groin Pain   [name] is a 23 y.o. female with a history of lupus who presents to the ED complaining of dysuria, hematuria or frequency and urinary urgency for the past week. Patient states that she feels like she is having pain in her urethra. Her pain is worse after urinating or with walking. Patient reports she's had an episode of leaking a small amount of urine on herself when she has urgency to urinate. The patient was seen at her OB/GYN office on 06/04/2014 and diagnosed with a UTI and started on Cipro and Pyridium. Patient put she's been taking these medicines as prescribed. Patient denies abdominal pain. Patient reports she's not been sexually active in several months. Patient denies history of STDs. Patient reports her last menstrual cycle was 05/21/2014 and was normal. Patient is on the Mirena IUD as of 10/2013. Patient denies abdominal pain, nausea, vomiting, diarrhea, vaginal discharge, vaginal bleeding, rashes, lesions, fevers or chills.  (Consider location/radiation/quality/duration/timing/severity/associated sxs/prior Treatment) HPI  Past Medical History  Diagnosis Date  . Hypothyroidism   . Tachycardia   . Preterm contractions 12/07/2011  . Maternal anemia complicating pregnancy, childbirth, or the puerperium 01/03/2012  . Blood transfusion without reported diagnosis 01/02/2012  . Palpitations   . Sjogren's disease   . History of Graves' disease   . Malabsorption syndrome   . Migraine with typical aura     single sided numbness  . Lupus   . Vitiligo    Past Surgical History  Procedure Laterality Date  . Liver patched    . Intestines removed      newborn  . Knee surgery    . Cesarean section  01/02/2012    Procedure: CESAREAN SECTION;  Surgeon: Lenoard Adenichard J Taavon, MD;  Location: [facility];   Service: Gynecology;  Laterality: N/A;   Family History  Problem Relation Age of Onset  . Diabetes Father   . Other Neg Hx   . Colon cancer Neg Hx   . Pancreatic cancer Neg Hx   . Stomach cancer Neg Hx   . Endometriosis Mother   . Heart disease Maternal Grandmother   . Hypertension Maternal Grandmother   . Osteoporosis Maternal Grandmother   . Heart disease Maternal Grandfather   . Hypertension Maternal Grandfather   . Heart disease Paternal Grandmother    History  Substance Use Topics  . Smoking status: Never Smoker   . Smokeless tobacco: Never Used  . Alcohol Use: No   OB History    Gravida Para Term Preterm AB TAB SAB Ectopic Multiple Living   3 1 1  0 1 0 0 1 0 1     Review of Systems  Constitutional: Negative for fever and chills.  HENT: Negative for congestion, ear pain, sore throat and trouble swallowing.   Eyes: Negative for pain and visual disturbance.  Respiratory: Negative for cough, shortness of breath and wheezing.   Cardiovascular: Negative for chest pain, palpitations and leg swelling.  Gastrointestinal: Positive for nausea. Negative for vomiting, abdominal pain, diarrhea and blood in stool.  Genitourinary: Positive for dysuria, urgency, frequency and hematuria. Negative for flank pain, vaginal bleeding, vaginal discharge and difficulty urinating.  Musculoskeletal: Negative for back pain and neck pain.  Skin: Negative for rash and wound.  Neurological: Negative for  dizziness, weakness and headaches.  All other systems reviewed and are negative.     Allergies  Penicillins and Morphine and related  Home Medications   Prior to Admission medications   Medication Sig Start Date End Date Taking? Authorizing Provider  ciprofloxacin (CIPRO) 500 MG tablet Take 1 tablet (500 mg total) by mouth 2 (two) times daily. 06/04/14  Yes [name], FNP  hydroxychloroquine (PLAQUENIL) 200 MG tablet Take 400 mg by mouth daily.   Yes Historical Provider, MD   hyoscyamine (LEVBID) 0.375 MG 12 hr tablet Take 1 tablet (0.375 mg total) by mouth 2 (two) times daily. 09/02/13  Yes Rachael Feeaniel P Jacobs, MD  iron polysaccharides (NIFEREX) 150 MG capsule Take 150 mg by mouth 2 (two) times daily.   Yes Historical Provider, MD  levothyroxine (SYNTHROID, LEVOTHROID) 112 MCG tablet Take 224 mcg by mouth 2 (two) times daily.    Yes Historical Provider, MD  OMEPRAZOLE PO Take 20 mg by mouth at bedtime.    Yes Historical Provider, MD  phenazopyridine (PYRIDIUM) 200 MG tablet Take 1 tablet (200 mg total) by mouth 3 (three) times daily as needed for pain. 06/04/14  Yes [name], FNP  pilocarpine (SALAGEN) 5 MG tablet Take 5 mg by mouth at bedtime.   Yes Historical Provider, MD  tiZANidine (ZANAFLEX) 4 MG tablet Take 4 mg by mouth every 8 (eight) hours as needed for muscle spasms.  09/02/13  Yes Historical Provider, MD  cholestyramine Lanetta Inch(QUESTRAN) 4 G packet Take 0.5 packets (2 g total) by mouth daily. Patient not taking: Reported on 06/07/2014 07/29/13   Rachael Feeaniel P Jacobs, MD  ibuprofen (ADVIL,MOTRIN) 600 MG tablet Take 600 mg by mouth every 8 (eight) hours as needed.  05/26/13   Historical Provider, MD  naproxen (NAPROSYN) 250 MG tablet Take 1 tablet (250 mg total) by mouth 2 (two) times daily with a meal. 06/08/14   Einar GipWilliam Duncan Gates Jividen, PA-C  nitrofurantoin (MACRODANTIN) 100 MG capsule Take 1 capsule (100 mg total) by mouth 2 (two) times daily. 06/08/14   Einar GipWilliam Duncan Marian Grandt, PA-C  ondansetron (ZOFRAN ODT) 4 MG disintegrating tablet Take 1 tablet (4 mg total) by mouth every 8 (eight) hours as needed for nausea or vomiting. 06/08/14   Einar GipWilliam Duncan Macauley Mossberg, PA-C  oxyCODONE-acetaminophen (PERCOCET/ROXICET) 5-325 MG per tablet Take 1 tablet by mouth every 4 (four) hours as needed for moderate pain or severe pain. 06/08/14   Einar GipWilliam Duncan Tanzie Rothschild, PA-C   BP 104/74 mmHg  Pulse 68  Temp(Src) 97.5 F (36.4 C) (Oral)  Resp 20  SpO2 99%  LMP 05/21/2014 Physical Exam   Constitutional: She appears well-developed and well-nourished. No distress.  HENT:  Head: Normocephalic and atraumatic.  Mouth/Throat: Oropharynx is clear and moist. No oropharyngeal exudate.  Eyes: Conjunctivae are normal. Pupils are equal, round, and reactive to light. Right eye exhibits no discharge. Left eye exhibits no discharge.  Neck: Neck supple.  Cardiovascular: Normal rate, regular rhythm, normal heart sounds and intact distal pulses.  Exam reveals no gallop and no friction rub.   No murmur heard. Pulmonary/Chest: Effort normal and breath sounds normal. No respiratory distress. She has no wheezes. She has no rales.  Abdominal: Soft. Bowel sounds are normal. She exhibits no distension and no mass. There is no tenderness. There is no rebound and no guarding. Hernia confirmed negative in the right inguinal area and confirmed negative in the left inguinal area.  Patient's abdomen is soft and nontender to palpation. Negative psoas and obturator sign. No rebound  tenderness. No CVA tenderness.   Genitourinary: There is no rash, tenderness, lesion or injury on the right labia. There is no rash, tenderness, lesion or injury on the left labia. Uterus is not enlarged and not tender. Cervix exhibits no motion tenderness, no discharge and no friability. Right adnexum displays no mass, no tenderness and no fullness. Left adnexum displays no mass, no tenderness and no fullness. No erythema, tenderness or bleeding in the vagina. No foreign body around the vagina. No signs of injury around the vagina. Vaginal discharge found.  Pelvic exam performed by me with female chaperone. No vaginal lesions or rashes noted. There is a small amount of creamy white vaginal discharge noted. Patient's Mirena IUD string is present. No cervical motion tenderness. No adnexal fullness or tenderness.  Musculoskeletal: She exhibits no edema.  Lymphadenopathy:    She has no cervical adenopathy.       Right: No inguinal  adenopathy present.       Left: No inguinal adenopathy present.  Neurological: She is alert. Coordination normal.  Skin: Skin is warm and dry. No rash noted. She is not diaphoretic. No erythema. No pallor.  Psychiatric: She has a normal mood and affect. Her behavior is normal.  Nursing note and vitals reviewed.   ED Course  Procedures (including critical care time) Labs Review Labs Reviewed  WET PREP, GENITAL - Abnormal; Notable for the following:    WBC, Wet Prep HPF POC RARE (*)    All other components within normal limits  URINALYSIS, ROUTINE W REFLEX MICROSCOPIC - Abnormal; Notable for the following:    Color, Urine ORANGE (*)    APPearance TURBID (*)    Hgb urine dipstick LARGE (*)    Bilirubin Urine SMALL (*)    Protein, ur >300 (*)    Nitrite POSITIVE (*)    Leukocytes, UA MODERATE (*)    All other components within normal limits  BASIC METABOLIC PANEL - Abnormal; Notable for the following:    Sodium 136 (*)    All other components within normal limits  CBC WITH DIFFERENTIAL - Abnormal; Notable for the following:    RDW 16.5 (*)    All other components within normal limits  URINE MICROSCOPIC-ADD ON - Abnormal; Notable for the following:    Squamous Epithelial / LPF MANY (*)    All other components within normal limits  GC/CHLAMYDIA PROBE AMP  URINE CULTURE  PREGNANCY, URINE  HIV ANTIBODY (ROUTINE TESTING)  RPR    Imaging Review Ct Abdomen Pelvis Wo Contrast  06/08/2014   CLINICAL DATA:  Groin pain, UTI symptoms, lupus flare up last week, had gross hematuria  EXAM: CT ABDOMEN AND PELVIS WITHOUT CONTRAST  TECHNIQUE: Multidetector CT imaging of the abdomen and pelvis was performed following the standard protocol without IV contrast. Sagittal and coronal MPR images reconstructed from axial data set. Oral contrast was not administered.  COMPARISON:  03/09/2013  FINDINGS: Lung bases clear.  Within limits of a nonenhanced exam no focal abnormalities of the liver, spleen,  pancreas, kidneys, or adrenal glands.  Specifically no urinary tract calcification, mass, or dilatation.  Bladder partially distended, no gross abnormality seen.  IUD within uterus.  Unremarkable adnexae.  Appendix not identified but no pericecal inflammatory process seen.  Stomach and bowel loops grossly unremarkable for technique.  No mass, adenopathy, free fluid or free air.  Bones unremarkable.  IMPRESSION: No acute intra-abdominal or intrapelvic abnormalities identified.   Electronically Signed   By: Angelyn Punt.D.  On: 06/08/2014 01:04     EKG Interpretation None      Filed Vitals:   06/07/14 1825 06/07/14 2223 06/08/14 0028 06/08/14 0142  BP:  157/97 117/80 104/74  Pulse:  65 87 68  Temp: 97.8 F (36.6 C) 98 F (36.7 C) 98 F (36.7 C) 97.5 F (36.4 C)  TempSrc: Oral Oral Oral Oral  Resp: 16 18 20 20   SpO2:  100% 96% 99%     MDM   Final diagnoses:  UTI (lower urinary tract infection)  Nausea   [name] is a 23 y.o. female with a history of lupus who presents to the ED complaining of dysuria, hematuria or frequency and urinary urgency for the past week. Patient is diagnosed with a UTI by her OB/GYN on 06/04/2014 and started on Cipro. Patient's culture grew Escherichia coli greater than 100,000 CFU. Patient is afebrile and nontoxic-appearing. The patient's abdomen is soft and nontender to palpation. On pelvic exam there is a mild amount of white vaginal discharge. There is no adnexal tenderness or fullness. The patient's cervix is closed and IUD string is visible. There are no rashes or lesions present. The patient is concerned that she might have a kidney stone due to having blood in her urine. CT scan her abdomen and pelvis is unremarkable. Patient's wet prep returned with rare white blood cells. Patient's BMP was unremarkable. Patient's CBC was unremarkable. Patient had negative urine pregnancy test. Patient's urinalysis returned with positive nitrites and many  bacteria. We'll switch this patient to [facility] for her UTI. Advised patient she needs to follow-up with her OB/GYN this week. Advised patient return to the emergency department with new or worsening symptoms or new concerns. Patient verbalized understanding and agreement with plan.  This patient was discussed with Dr. Patria Mane who agrees with assessment and plan.        Lawana Chambers, PA-C 06/08/14 0230  Lyanne Co, MD 06/08/14 [phone]

## 2014-06-08 ENCOUNTER — Emergency Department (HOSPITAL_COMMUNITY): Payer: Commercial Indemnity

## 2014-06-08 LAB — GC/CHLAMYDIA PROBE AMP
CT Probe RNA: POSITIVE — AB
GC PROBE AMP APTIMA: NEGATIVE

## 2014-06-08 LAB — RPR

## 2014-06-08 LAB — HIV ANTIBODY (ROUTINE TESTING W REFLEX): HIV 1&2 Ab, 4th Generation: NONREACTIVE

## 2014-06-08 MED ORDER — ONDANSETRON 4 MG PO TBDP: 4.0000 mg | ORAL_TABLET | Freq: Three times a day (TID) | ORAL | Status: DC | PRN

## 2014-06-08 MED ORDER — NAPROXEN 250 MG PO TABS: 250.0000 mg | ORAL_TABLET | Freq: Two times a day (BID) | ORAL | Status: DC

## 2014-06-08 MED ORDER — NITROFURANTOIN MACROCRYSTAL 100 MG PO CAPS: 100.0000 mg | ORAL_CAPSULE | Freq: Two times a day (BID) | ORAL | Status: DC

## 2014-06-08 MED ORDER — OXYCODONE-ACETAMINOPHEN 5-325 MG PO TABS: 1.0000 | ORAL_TABLET | ORAL | Status: DC | PRN

## 2014-06-08 NOTE — Discharge Instructions (Signed)
Urinary Tract Infection °Urinary tract infections (UTIs) can develop anywhere along your urinary tract. Your urinary tract is your body's drainage system for removing wastes and extra water. Your urinary tract includes two kidneys, two ureters, a bladder, and a urethra. Your kidneys are a pair of bean-shaped organs. Each kidney is about the size of your fist. They are located below your ribs, one on each side of your spine. °CAUSES °Infections are caused by microbes, which are microscopic organisms, including fungi, viruses, and bacteria. These organisms are so small that they can only be seen through a microscope. Bacteria are the microbes that most commonly cause UTIs. °SYMPTOMS  °Symptoms of UTIs may vary by age and gender of the patient and by the location of the infection. Symptoms in young women typically include a frequent and intense urge to urinate and a painful, burning feeling in the bladder or urethra during urination. Older women and men are more likely to be tired, shaky, and weak and have muscle aches and abdominal pain. A fever may mean the infection is in your kidneys. Other symptoms of a kidney infection include pain in your back or sides below the ribs, nausea, and vomiting. °DIAGNOSIS °To diagnose a UTI, your caregiver will ask you about your symptoms. Your caregiver also will ask to provide a urine sample. The urine sample will be tested for bacteria and white blood cells. White blood cells are made by your body to help fight infection. °TREATMENT  °Typically, UTIs can be treated with medication. Because most UTIs are caused by a bacterial infection, they usually can be treated with the use of antibiotics. The choice of antibiotic and length of treatment depend on your symptoms and the type of bacteria causing your infection. °HOME CARE INSTRUCTIONS °· If you were prescribed antibiotics, take them exactly as your caregiver instructs you. Finish the medication even if you feel better after you  have only taken some of the medication. °· Drink enough water and fluids to keep your urine clear or pale yellow. °· Avoid caffeine, tea, and carbonated beverages. They tend to irritate your bladder. °· Empty your bladder often. Avoid holding urine for long periods of time. °· Empty your bladder before and after sexual intercourse. °· After a bowel movement, women should cleanse from front to back. Use each tissue only once. °SEEK MEDICAL CARE IF:  °· You have back pain. °· You develop a fever. °· Your symptoms do not begin to resolve within 3 days. °SEEK IMMEDIATE MEDICAL CARE IF:  °· You have severe back pain or lower abdominal pain. °· You develop chills. °· You have nausea or vomiting. °· You have continued burning or discomfort with urination. °MAKE SURE YOU:  °· Understand these instructions. °· Will watch your condition. °· Will get help right away if you are not doing well or get worse. °Document Released: 03/28/2005 Document Revised: 12/18/2011 Document Reviewed: 07/27/2011 °ExitCare® Patient Information ©2015 [employer]. This information is not intended to replace advice given to you by your health care provider. Make sure you discuss any questions you have with your health care provider. ° °Antibiotic Medication °Antibiotics are among the most frequently prescribed medicines. Antibiotics cure illness by assisting our body to injure or kill the bacteria that cause infection. While antibiotics are useful to treat a wide variety of infections they are useless against viruses. Antibiotics cannot cure colds, flu, or other viral infections.  °There are many types of antibiotics available. Your caregiver will decide which antibiotic will   be useful for an illness. Never take or give someone else's antibiotics or left over medicine. °Your caregiver may also take into account: °· Allergies. °· The cost of the medicine. °· Dosing schedules. °· Taste. °· Common side effects when choosing an antibiotic for an  infection. °Ask your caregiver if you have questions about why a certain medicine was chosen. °HOME CARE INSTRUCTIONS °Read all instructions and labels on medicine bottles carefully. Some antibiotics should be taken on an empty stomach while others should be taken with food. Taking antibiotics incorrectly may reduce how well they work. Some antibiotics need to be kept in the refrigerator. Others should be kept at room temperature. Ask your caregiver or pharmacist if you do not understand how to give the medicine. °Be sure to give the amount of medicine your caregiver has prescribed. Even if you feel better and your symptoms improve, bacteria may still remain alive in the body. Taking all of the medicine will prevent: °· The infection from returning and becoming harder to treat. °· Complications from partially treated infections. °If there is any medicine left over after you have taken the medicine as your caregiver has instructed, throw the medicine away. °Be sure to tell your caregiver if you: °· Are allergic to any medicines. °· Are pregnant or intend to become pregnant while using this medicine. °· Are breastfeeding. °· Are taking any other prescription, non-prescription medicine, or herbal remedies. °· Have any other medical conditions or problems you have not already discussed. °If you are taking birth control pills, they may not work while you are on antibiotics. To avoid unwanted pregnancy: °· Continue taking your birth control pills as usual. °· Use a second form of birth control (such as condoms) while you are taking antibiotic medicine. °· When you finish taking the antibiotic medicine, continue using the second form of birth control until you are finished with your current 1 month cycle of birth control pills. °Try not to miss any doses of medicine. If you miss a dose, take it as soon as possible. However, if it is almost time for the next dose and the dosing schedule is: °· 2 doses a day, take the missed  dose and the next dose 5 to 6 hours apart. °· 3 or more doses a day, take the missed dose and the next dose 2 to 4 hours apart, then go back to the normal schedule. °· If you are unable to make up a missed dose, take the next scheduled dose on time and complete the missed dose at the end of the prescribed time for your medicine. °SIDE EFFECTS TO TAKING ANTIBIOTICS °Common side effects to antibiotic use include: °· Soft stools or diarrhea. °· Mild stomach upset. °· Sun sensitivity. °SEEK MEDICAL CARE IF:  °· If you get worse or do not improve within a few days of starting the medicine. °· Vomiting develops. °· Diaper rash or rash on the genitals appears. °· Vaginal itching occurs. °· White patches appear on the tongue or in the mouth. °· Severe watery diarrhea and abdominal cramps occur. °· Signs of an allergy develop (hives, unknown itchy rash appears). STOP TAKING THE ANTIBIOTIC. °SEEK IMMEDIATE MEDICAL CARE IF:  °· Urine turns dark or blood colored. °· Skin turns yellow. °· Easy bruising or bleeding occurs. °· Joint pain or muscle aches occur. °· Fever returns. °· Severe headache occurs. °· Signs of an allergy develop (trouble breathing, wheezing, swelling of the lips, face or tongue, fainting, or blisters on the   skin or in the mouth). STOP TAKING THE ANTIBIOTIC. °Document Released: 02/29/2004 Document Revised: 09/10/2011 Document Reviewed: 03/10/2009 °ExitCare® Patient Information ©2015 [employer]. This information is not intended to replace advice given to you by your health care provider. Make sure you discuss any questions you have with your health care provider. ° °

## 2014-06-08 NOTE — ED Notes (Signed)
Patient is using cell phone while assessing. She is resting quietly with no signs of distress.

## 2014-06-09 ENCOUNTER — Telehealth (HOSPITAL_COMMUNITY): Payer: Self-pay

## 2014-06-09 LAB — URINE CULTURE
Colony Count: NO GROWTH
Culture: NO GROWTH
Special Requests: NORMAL

## 2014-06-09 NOTE — Telephone Encounter (Signed)
Results received from [facility].  (+) Chlamydia   No antibiotic treatment or prescription given for STD.  Chart to MD office for review.  [facility] form attached. 

## 2014-06-11 ENCOUNTER — Telehealth: Payer: Self-pay | Admitting: Nurse Practitioner

## 2014-06-11 MED ORDER — AZITHROMYCIN 250 MG PO TABS: ORAL_TABLET | ORAL | Status: DC

## 2014-06-11 NOTE — Telephone Encounter (Signed)
Patein was called to schedule TOC for UTI.  We then realized that [facility]  The urine culture there was negative so does not TOC for UTI.  But she has + chlamydia test.  She is still symptomatic with pelvic pain but no vaginal bleeding.  She is requesting a Zithromax Z pack stating "this is the only thing that helps her".  We will go ahead and treat her at this time with Zirhromax 1 gm today. Orders have been placed.  Judeth CornfieldStephanie will call her with this test result and will go ahead and treat but still may be notified from the ED.  She will need TOC for chlamydia in 3 months and will schedule.

## 2014-06-11 NOTE — Telephone Encounter (Signed)
Pt returning call to stephanie

## 2014-06-11 NOTE — Telephone Encounter (Signed)
See phone note from Shirlyn GoltzPatty Grubb dated today for return call details. Closing encounter.

## 2014-06-11 NOTE — Telephone Encounter (Signed)
Pt returned call.  Advised of positive results and of need to treat.  Pt is agreeable.  Instructions given on how to take medication.  She will pick up RX today.  Advised pt she will need 3 month TOC.  She will call to schedule. Notified pt other labs were negative.  Also urine culture done on 12/7 was negative so she does not need 2 week TOC. Pt agreeable with this plan.  Routing to provider for final review.

## 2014-06-11 NOTE — Telephone Encounter (Signed)
I have attempted to contact this patient by phone with the following results: left message to return my call on answering machine.  [phone] Advised pt in message I would be leaving for the day and to ask for a triage nurse if I am unavailable.

## 2014-06-12 ENCOUNTER — Telehealth: Payer: Self-pay | Admitting: *Deleted

## 2014-07-23 ENCOUNTER — Ambulatory Visit: Payer: Managed Care, Other (non HMO) | Admitting: Gynecology

## 2014-08-03 ENCOUNTER — Ambulatory Visit: Payer: Self-pay | Admitting: Nurse Practitioner

## 2014-09-03 ENCOUNTER — Ambulatory Visit: Payer: Commercial Indemnity | Admitting: Dietician

## 2014-09-24 ENCOUNTER — Other Ambulatory Visit: Payer: Self-pay | Admitting: Nurse Practitioner

## 2014-09-27 ENCOUNTER — Ambulatory Visit (INDEPENDENT_AMBULATORY_CARE_PROVIDER_SITE_OTHER): Payer: Self-pay | Admitting: Obstetrics and Gynecology

## 2014-09-27 ENCOUNTER — Encounter: Payer: Self-pay | Admitting: Obstetrics and Gynecology

## 2014-09-27 ENCOUNTER — Telehealth: Payer: Self-pay | Admitting: Nurse Practitioner

## 2014-09-27 VITALS — BP 138/70 | HR 80 | Ht 60.0 in | Wt 96.8 lb

## 2014-09-27 DIAGNOSIS — Z30432 Encounter for removal of intrauterine contraceptive device: Secondary | ICD-10-CM

## 2014-09-27 DIAGNOSIS — Z708 Other sex counseling: Secondary | ICD-10-CM

## 2014-09-27 DIAGNOSIS — M25559 Pain in unspecified hip: Secondary | ICD-10-CM

## 2014-09-27 DIAGNOSIS — R8290 Unspecified abnormal findings in urine: Secondary | ICD-10-CM

## 2014-09-27 LAB — POCT URINALYSIS DIPSTICK
Urobilinogen, UA: NEGATIVE
pH, UA: 5

## 2014-09-27 LAB — CBC WITH DIFFERENTIAL/PLATELET
BASOS ABS: 0 10*3/uL (ref 0.0–0.1)
Basophils Relative: 0 % (ref 0–1)
Eosinophils Absolute: 0.1 10*3/uL (ref 0.0–0.7)
Eosinophils Relative: 1 % (ref 0–5)
HCT: 39.5 % (ref 36.0–46.0)
HEMOGLOBIN: 12.8 g/dL (ref 12.0–15.0)
LYMPHS ABS: 2.1 10*3/uL (ref 0.7–4.0)
Lymphocytes Relative: 29 % (ref 12–46)
MCH: 28.2 pg (ref 26.0–34.0)
MCHC: 32.4 g/dL (ref 30.0–36.0)
MCV: 87 fL (ref 78.0–100.0)
MPV: 10.8 fL (ref 8.6–12.4)
Monocytes Absolute: 0.4 10*3/uL (ref 0.1–1.0)
Monocytes Relative: 6 % (ref 3–12)
NEUTROS PCT: 64 % (ref 43–77)
Neutro Abs: 4.5 10*3/uL (ref 1.7–7.7)
PLATELETS: 225 10*3/uL (ref 150–400)
RBC: 4.54 MIL/uL (ref 3.87–5.11)
RDW: 16.1 % — ABNORMAL HIGH (ref 11.5–15.5)
WBC: 7.1 10*3/uL (ref 4.0–10.5)

## 2014-09-27 MED ORDER — LEVOFLOXACIN 500 MG PO TABS: 500.0000 mg | ORAL_TABLET | Freq: Every day | ORAL | Status: DC

## 2014-09-27 MED ORDER — METRONIDAZOLE 500 MG PO TABS: 500.0000 mg | ORAL_TABLET | Freq: Two times a day (BID) | ORAL | Status: DC

## 2014-09-27 NOTE — Telephone Encounter (Signed)
Pt states her iud is causing her pain and bleeding and would like it removed. Has continual bladder infections since getting iud. Would like removal asap. Pt states ok to leave detailed message on cell phone - she is at work.

## 2014-09-27 NOTE — Telephone Encounter (Signed)
[facility] to [facility], patient coverage termed 12.31.2015. I called patient to find out if she has new coverage. She stated that she does not. I advised of the allowed amount for this service. Patient agreeable and states that she would like to keep appointment as scheduled.

## 2014-09-27 NOTE — Telephone Encounter (Signed)
Spoke with patient this morning. Patient is scheduled to see Dr.Silva today at 3:45pm. Please see telephone encounter.  Routing to provider for final review. I am unable to close encounter due to pending medications.

## 2014-09-27 NOTE — Telephone Encounter (Signed)
Spoke with patient. Patient states that she has been having abdominal pain and spotting since December with IUD. Patient was seen with Jamie FranklinPatricia Rolen-Grubb, FNP 06/04/2014. Patient has had Mirena IUD since 10/2013. "I have had a urinary tract infection since December. I have been treated three times and still feel like it is there." Patient is having urinary frequency and burning with urination. "I am having a cycle every month and the bleeding is heavier." Patient is currently having spotting with urination. Denies any lower back pain, fevers, or chills. Pain is 10/10 with little relief with ibuprofen. Patient would like to have IUD removed. Appointment scheduled for today at 3:45pm with Dr.Silva. Patient is agreeable to date and time. Order placed for precert.  Cc: Jamie MusterSabrina Bryan  Routing to provider for final review. Patient agreeable to disposition. Will close encounter

## 2014-09-27 NOTE — Progress Notes (Signed)
GYNECOLOGY  VISIT   HPI: 24 y.o.   Married  [place]  female   [phone] with Patient's last menstrual period was 09/09/2014.   here for  IUD removal and dysuria.   Mirena IUD placed in May 2015.  Dysmenorrhea and irregular menses prior to menses.  These symptoms have not resolved.   Unremarkable ultrasound in September 2015. Normal CT scan of abdomen and pelvis in December 2015.   Having dysuria today.  Has some chills. Feels hot and cold.  Some nausea and vomiting.   Last sexual activity was in November 2015.   Positive chlamydia test in December 2015.  Treated with a Z pack.   Urine - Blood 2+, WBCs 2+  GYNECOLOGIC HISTORY: Patient's last menstrual period was 09/09/2014.        OB History    Gravida Para Term Preterm AB TAB SAB Ectopic Multiple Living   0 1 0 0 1 0 1         Patient Active Problem List   Diagnosis Date Noted  . H/O Graves' disease 07/22/2013  . Sjogren's disease 07/22/2013  . Malabsorption syndrome 07/22/2013  . Migraine with aura 07/22/2013  . Pelvic adhesive disease 07/22/2013  . Hypothyroidism complicating pregnancy / delivered 12/28/2011    Past Medical History  Diagnosis Date  . Hypothyroidism   . Tachycardia   . Preterm contractions 12/07/2011  . Maternal anemia complicating pregnancy, childbirth, or the puerperium 01/03/2012  . Blood transfusion without reported diagnosis 01/02/2012  . Palpitations   . Sjogren's disease   . History of Graves' disease   . Malabsorption syndrome   . Migraine with typical aura     single sided numbness  . Lupus   . Vitiligo     Past Surgical History  Procedure Laterality Date  . Liver patched    . Intestines removed      newborn  . Knee surgery    . Cesarean section  01/02/2012    Procedure: CESAREAN SECTION;  Surgeon: Lenoard Aden, MD;  Location: [facility];  Service: Gynecology;  Laterality: N/A;    Current Outpatient Prescriptions  Medication Sig Dispense Refill  . cholestyramine  (QUESTRAN) 4 G packet Take 0.5 packets (2 g total) by mouth daily. 30 each 12  . hydroxychloroquine (PLAQUENIL) 200 MG tablet Take 400 mg by mouth daily.    . hyoscyamine (LEVBID) 0.375 MG 12 hr tablet Take 1 tablet (0.375 mg total) by mouth 2 (two) times daily. 60 tablet 6  . ibuprofen (ADVIL,MOTRIN) 600 MG tablet Take 600 mg by mouth every 8 (eight) hours as needed.     . iron polysaccharides (NIFEREX) 150 MG capsule Take 150 mg by mouth 2 (two) times daily.    Marland Kitchen levothyroxine (SYNTHROID, LEVOTHROID) 112 MCG tablet Take 224 mcg by mouth 2 (two) times daily.     . naproxen (NAPROSYN) 250 MG tablet Take 1 tablet (250 mg total) by mouth 2 (two) times daily with a meal. 20 tablet 0  . OMEPRAZOLE PO Take 20 mg by mouth at bedtime.     . ondansetron (ZOFRAN ODT) 4 MG disintegrating tablet Take 1 tablet (4 mg total) by mouth every 8 (eight) hours as needed for nausea or vomiting. 10 tablet 0  . pilocarpine (SALAGEN) 5 MG tablet Take 5 mg by mouth at bedtime.    . ciprofloxacin (CIPRO) 500 MG tablet Take 1 tablet (500 mg total) by mouth 2 (two) times daily. (Patient not taking: Reported  on 09/27/2014) 14 tablet 0  . oxyCODONE-acetaminophen (PERCOCET/ROXICET) 5-325 MG per tablet Take 1 tablet by mouth every 4 (four) hours as needed for moderate pain or severe pain. (Patient not taking: Reported on 09/27/2014) 8 tablet 0  . tiZANidine (ZANAFLEX) 4 MG tablet Take 4 mg by mouth every 8 (eight) hours as needed for muscle spasms.      No current facility-administered medications for this visit.     ALLERGIES: Penicillins and Morphine and related  Family History  Problem Relation Age of Onset  . Diabetes Father   . Other Neg Hx   . Colon cancer Neg Hx   . Pancreatic cancer Neg Hx   . Stomach cancer Neg Hx   . Endometriosis Mother   . Heart disease Maternal Grandmother   . Hypertension Maternal Grandmother   . Osteoporosis Maternal Grandmother   . Heart disease Maternal Grandfather   . Hypertension  Maternal Grandfather   . Heart disease Paternal Grandmother     History   Social History  . Marital Status: Married    Spouse Name: N/A  . Number of Children: 1  . Years of Education: N/A   Occupational History  . Student    Social History Main Topics  . Smoking status: Never Smoker   . Smokeless tobacco: Never Used  . Alcohol Use: No  . Drug Use: No  . Sexual Activity: Yes    Birth Control/ Protection: None, Condom   Other Topics Concern  . Not on file   Social History Narrative    ROS:  Pertinent items are noted in HPI.  PHYSICAL EXAMINATION:    BP 138/70 mmHg  Pulse 80  Ht 5' (1.524 m)  Wt 96 lb 12.8 oz (43.908 kg)  BMI 18.90 kg/m2  LMP 09/09/2014    Temp - 98.5 General appearance: alert, cooperative and appears stated age Lungs: clear to auscultation bilaterally Heart: regular rate and rhythm Abdomen: soft, tender across lower abdomen; no guarding or rebound; no masses,  no organomegaly No abnormal inguinal nodes palpated  Pelvic: External genitalia:  no lesions              Urethra:  normal appearing urethra with no masses, tenderness or lesions              Bartholins and Skenes: normal                 Vagina: normal appearing vagina with normal color and discharge, no lesions              Cervix: normal appearance.  IUD strings noted.  Orange mucous present (took Pyridium).  CMT present.  IUD removed with ring forceps after verbal consent.                    Bimanual Exam:  Uterus:  uterus is normal size, shape, consistency and tender                                      Adnexa: normal adnexa in size, tender bilaterally, and no masses                                      ASSESSMENT  Pelvic pain.  Possible PID.  No acute abdomen.  Recent chlamydia.  Mirena IUD removal.  History  of irregular menses and pelvic pain.   Normal pelvic ultrasound last Fall.  Positive urinalysis.  PCN allergy - anaphylaxis.  PLAN  CBC with differential, GC/CT, urine  micro and culture.  Will treat with Levaquin 500 mg po bid for 14 days and Flagyl 500 mg po bid for 14 days.  OK for Motrin or Naprosyn for pain.  Return to office for a recheck on 3/30 or 3/331/ for recheck.  Call for fever, worsening pain, or heavy bleeding.  Patient understands she is to abstain from sexual activity at this time and that she does not have pregnancy prevention now that her IUD is removed.  An After Visit Summary was printed and given to the patient.  __25____ minutes face to face time of which over 50% was spent in counseling.

## 2014-09-28 ENCOUNTER — Telehealth: Payer: Self-pay | Admitting: *Deleted

## 2014-09-28 LAB — URINALYSIS, MICROSCOPIC ONLY
BACTERIA UA: NONE SEEN
Casts: NONE SEEN
Crystals: NONE SEEN
Squamous Epithelial / LPF: NONE SEEN

## 2014-09-28 LAB — URINE CULTURE
COLONY COUNT: NO GROWTH
ORGANISM ID, BACTERIA: NO GROWTH

## 2014-09-28 NOTE — Telephone Encounter (Signed)
Call to patient to schedule follow-up appointment for tomorrow. Left message to call back.

## 2014-09-29 ENCOUNTER — Emergency Department (HOSPITAL_COMMUNITY)
Admission: EM | Admit: 2014-09-29 | Discharge: 2014-09-29 | Disposition: A | Discharge status: Home / Self Care | Payer: Self-pay | Attending: Emergency Medicine | Admitting: Emergency Medicine

## 2014-09-29 ENCOUNTER — Encounter (HOSPITAL_COMMUNITY): Payer: Self-pay | Admitting: Emergency Medicine

## 2014-09-29 ENCOUNTER — Telehealth: Payer: Self-pay | Admitting: Emergency Medicine

## 2014-09-29 DIAGNOSIS — Z8739 Personal history of other diseases of the musculoskeletal system and connective tissue: Secondary | ICD-10-CM | POA: Insufficient documentation

## 2014-09-29 DIAGNOSIS — N3090 Cystitis, unspecified without hematuria: Secondary | ICD-10-CM | POA: Insufficient documentation

## 2014-09-29 DIAGNOSIS — Z3202 Encounter for pregnancy test, result negative: Secondary | ICD-10-CM | POA: Insufficient documentation

## 2014-09-29 DIAGNOSIS — E039 Hypothyroidism, unspecified: Secondary | ICD-10-CM | POA: Insufficient documentation

## 2014-09-29 DIAGNOSIS — Z79899 Other long term (current) drug therapy: Secondary | ICD-10-CM | POA: Insufficient documentation

## 2014-09-29 DIAGNOSIS — Z792 Long term (current) use of antibiotics: Secondary | ICD-10-CM | POA: Insufficient documentation

## 2014-09-29 DIAGNOSIS — Z8679 Personal history of other diseases of the circulatory system: Secondary | ICD-10-CM | POA: Insufficient documentation

## 2014-09-29 DIAGNOSIS — Z880 Allergy status to penicillin: Secondary | ICD-10-CM | POA: Insufficient documentation

## 2014-09-29 DIAGNOSIS — Z872 Personal history of diseases of the skin and subcutaneous tissue: Secondary | ICD-10-CM | POA: Insufficient documentation

## 2014-09-29 DIAGNOSIS — N939 Abnormal uterine and vaginal bleeding, unspecified: Secondary | ICD-10-CM | POA: Insufficient documentation

## 2014-09-29 LAB — CBC WITH DIFFERENTIAL/PLATELET
Basophils Absolute: 0 10*3/uL (ref 0.0–0.1)
Basophils Relative: 0 % (ref 0–1)
Eosinophils Absolute: 0.1 10*3/uL (ref 0.0–0.7)
Eosinophils Relative: 1 % (ref 0–5)
HEMATOCRIT: 40.8 % (ref 36.0–46.0)
HEMOGLOBIN: 13.2 g/dL (ref 12.0–15.0)
Lymphocytes Relative: 21 % (ref 12–46)
Lymphs Abs: 1.3 10*3/uL (ref 0.7–4.0)
MCH: 28.6 pg (ref 26.0–34.0)
MCHC: 32.4 g/dL (ref 30.0–36.0)
MCV: 88.3 fL (ref 78.0–100.0)
MONO ABS: 0.3 10*3/uL (ref 0.1–1.0)
Monocytes Relative: 4 % (ref 3–12)
NEUTROS ABS: 4.5 10*3/uL (ref 1.7–7.7)
Neutrophils Relative %: 74 % (ref 43–77)
Platelets: 190 10*3/uL (ref 150–400)
RBC: 4.62 MIL/uL (ref 3.87–5.11)
RDW: 16 % — AB (ref 11.5–15.5)
WBC: 6.1 10*3/uL (ref 4.0–10.5)

## 2014-09-29 LAB — BASIC METABOLIC PANEL
Anion gap: 8 (ref 5–15)
BUN: 11 mg/dL (ref 6–23)
CHLORIDE: 107 mmol/L (ref 96–112)
CO2: 22 mmol/L (ref 19–32)
Calcium: 9.2 mg/dL (ref 8.4–10.5)
Creatinine, Ser: 1.01 mg/dL (ref 0.50–1.10)
GFR calc non Af Amer: 76 mL/min — ABNORMAL LOW (ref >90)
GFR, EST AFRICAN AMERICAN: 88 mL/min — AB (ref >90)
GLUCOSE: 87 mg/dL (ref 70–99)
Potassium: 3.4 mmol/L — ABNORMAL LOW (ref 3.5–5.1)
SODIUM: 137 mmol/L (ref 135–145)

## 2014-09-29 LAB — URINALYSIS, ROUTINE W REFLEX MICROSCOPIC
Glucose, UA: NEGATIVE mg/dL
KETONES UR: NEGATIVE mg/dL
Nitrite: POSITIVE — AB
PROTEIN: 100 mg/dL — AB
Specific Gravity, Urine: 1.014 (ref 1.005–1.030)
Urobilinogen, UA: 1 mg/dL (ref 0.0–1.0)
pH: 6 (ref 5.0–8.0)

## 2014-09-29 LAB — POC URINE PREG, ED: Preg Test, Ur: NEGATIVE

## 2014-09-29 LAB — WET PREP, GENITAL
Clue Cells Wet Prep HPF POC: NONE SEEN
Trich, Wet Prep: NONE SEEN
WBC WET PREP: NONE SEEN
Yeast Wet Prep HPF POC: NONE SEEN

## 2014-09-29 LAB — URINE MICROSCOPIC-ADD ON

## 2014-09-29 MED ORDER — NITROFURANTOIN MONOHYD MACRO 100 MG PO CAPS: 100.0000 mg | ORAL_CAPSULE | Freq: Two times a day (BID) | ORAL | Status: DC

## 2014-09-29 NOTE — Telephone Encounter (Signed)
-----   Message from Patton SallesBrook E Amundson C Silva, MD sent at 09/28/2014  8:43 PM EDT ----- Regarding: Needs appointment with Dr. Edward JollySilva on 09/30/14 Please schedule a recheck appointment for me to see the patient on 09/30/14.   I am treating her for PID, and she did not reschedule an appointment for a recheck.    Thank you,   Conley SimmondsBrook Silva, MD

## 2014-09-29 NOTE — Discharge Instructions (Signed)

## 2014-09-29 NOTE — Telephone Encounter (Signed)
[facility].  Will see patient tomorrow for a recheck.  We will contact her when the GC/CT are back.

## 2014-09-29 NOTE — ED Provider Notes (Signed)
CSN: [id number]     Arrival date & time 09/29/14  9147 History   First MD Initiated Contact with Patient 09/29/14 [phone]     Chief Complaint  Patient presents with  . Abdominal Pain     (Consider location/radiation/quality/duration/timing/severity/associated sxs/prior Treatment) Patient is a 24 y.o. female presenting with abdominal pain.  Abdominal Pain Pain location:  Suprapubic and periumbilical Pain quality: sharp   Pain radiates to:  Does not radiate Pain severity:  Moderate Onset quality:  Gradual Duration:  1 day Timing:  Constant Progression:  Worsening Chronicity:  New Context comment:  Ho some abd pain, had IUD removed 2 days ago Relieved by:  Nothing Worsened by:  Palpation and urination Ineffective treatments:  None tried Associated symptoms: hematuria (end of stream), nausea and vaginal bleeding   Associated symptoms: no anorexia, no constipation, no diarrhea, no dysuria, no fever, no flatus, no shortness of breath and no vomiting     Past Medical History  Diagnosis Date  . Hypothyroidism   . Tachycardia   . Preterm contractions 12/07/2011  . Maternal anemia complicating pregnancy, childbirth, or the puerperium 01/03/2012  . Blood transfusion without reported diagnosis 01/02/2012  . Palpitations   . Sjogren's disease   . History of Graves' disease   . Malabsorption syndrome   . Migraine with typical aura     single sided numbness  . Lupus   . Vitiligo    Past Surgical History  Procedure Laterality Date  . Liver patched    . Intestines removed      newborn  . Knee surgery    . Cesarean section  01/02/2012    Procedure: CESAREAN SECTION;  Surgeon: Lenoard Aden, MD;  Location: [facility];  Service: Gynecology;  Laterality: N/A;   Family History  Problem Relation Age of Onset  . Diabetes Father   . Other Neg Hx   . Colon cancer Neg Hx   . Pancreatic cancer Neg Hx   . Stomach cancer Neg Hx   . Endometriosis Mother   . Heart disease Maternal Grandmother   .  Hypertension Maternal Grandmother   . Osteoporosis Maternal Grandmother   . Heart disease Maternal Grandfather   . Hypertension Maternal Grandfather   . Heart disease Paternal Grandmother    History  Substance Use Topics  . Smoking status: Never Smoker   . Smokeless tobacco: Never Used  . Alcohol Use: No   OB History    Gravida Para Term Preterm AB TAB SAB Ectopic Multiple Living   0 1 0 0 1 0 1     Review of Systems  Constitutional: Negative for fever.  Respiratory: Negative for shortness of breath.   Gastrointestinal: Positive for nausea and abdominal pain. Negative for vomiting, diarrhea, constipation, anorexia and flatus.  Genitourinary: Positive for hematuria (end of stream) and vaginal bleeding. Negative for dysuria.  All other systems reviewed and are negative.     Allergies  Penicillins and Morphine and related  Home Medications   Prior to Admission medications   Medication Sig Start Date End Date Taking? Authorizing Provider  cholestyramine (QUESTRAN) 4 G packet Take 0.5 packets (2 g total) by mouth daily. Patient taking differently: Take 4 g by mouth daily as needed (loose stool).  07/29/13  Yes Rachael Fee, MD  hydroxychloroquine (PLAQUENIL) 200 MG tablet Take 400 mg by mouth daily.   Yes Historical Provider, MD  hyoscyamine (LEVBID) 0.375 MG 12 hr tablet Take 1 tablet (0.375 mg total)  by mouth 2 (two) times daily. 09/02/13  Yes Rachael Feeaniel P Jacobs, MD  ibuprofen (ADVIL,MOTRIN) 600 MG tablet Take 600 mg by mouth every 8 (eight) hours as needed.  05/26/13  Yes Historical Provider, MD  iron polysaccharides (NIFEREX) 150 MG capsule Take 150 mg by mouth 2 (two) times daily.   Yes Historical Provider, MD  levothyroxine (SYNTHROID, LEVOTHROID) 112 MCG tablet Take 224 mcg by mouth daily.    Yes Historical Provider, MD  metroNIDAZOLE (FLAGYL) 500 MG tablet Take 1 tablet (500 mg total) by mouth 2 (two) times daily. 09/27/14  Yes Brook E Ardell IsaacsAmundson C Silva, MD  naproxen  (NAPROSYN) 250 MG tablet Take 1 tablet (250 mg total) by mouth 2 (two) times daily with a meal. Patient taking differently: Take 250 mg by mouth 2 (two) times daily as needed for mild pain or moderate pain.  06/08/14  Yes Everlene FarrierWilliam Dansie, PA-C  OMEPRAZOLE PO Take 20 mg by mouth at bedtime.    Yes Historical Provider, MD  ondansetron (ZOFRAN ODT) 4 MG disintegrating tablet Take 1 tablet (4 mg total) by mouth every 8 (eight) hours as needed for nausea or vomiting. 06/08/14  Yes Everlene FarrierWilliam Dansie, PA-C  pilocarpine (SALAGEN) 5 MG tablet Take 5 mg by mouth at bedtime.   Yes Historical Provider, MD  tiZANidine (ZANAFLEX) 4 MG tablet Take 4 mg by mouth every 8 (eight) hours as needed for muscle spasms.  09/02/13  Yes Historical Provider, MD  ciprofloxacin (CIPRO) 500 MG tablet Take 1 tablet (500 mg total) by mouth 2 (two) times daily. Patient not taking: Reported on 09/27/2014 06/04/14   Roanna BanningPatricia R Grubb, FNP  levofloxacin (LEVAQUIN) 500 MG tablet Take 1 tablet (500 mg total) by mouth daily. Take one tablet daily for 14 days Patient not taking: Reported on 09/29/2014 09/27/14   Patton SallesBrook E Amundson C Silva, MD  nitrofurantoin, macrocrystal-monohydrate, (MACROBID) 100 MG capsule Take 1 capsule (100 mg total) by mouth 2 (two) times daily. X 7 days 09/29/14   Mirian MoMatthew Krishawna Stiefel, MD  oxyCODONE-acetaminophen (PERCOCET/ROXICET) 5-325 MG per tablet Take 1 tablet by mouth every 4 (four) hours as needed for moderate pain or severe pain. Patient not taking: Reported on 09/27/2014 06/08/14   Everlene FarrierWilliam Dansie, PA-C   BP 122/75 mmHg  Pulse 73  Temp(Src) 98.7 F (37.1 C) (Oral)  Resp 18  Ht 5' (1.524 m)  Wt 96 lb (43.545 kg)  BMI 18.75 kg/m2  SpO2 98%  LMP 09/09/2014 Physical Exam  Constitutional: She is oriented to person, place, and time. She appears well-developed and well-nourished.  HENT:  Head: Normocephalic and atraumatic.  Right Ear: External ear normal.  Left Ear: External ear normal.  Eyes: Conjunctivae and EOM are  normal. Pupils are equal, round, and reactive to light.  Neck: Normal range of motion. Neck supple.  Cardiovascular: Normal rate, regular rhythm, normal heart sounds and intact distal pulses.   Pulmonary/Chest: Effort normal and breath sounds normal.  Abdominal: Soft. Bowel sounds are normal. There is tenderness in the periumbilical area and suprapubic area.  Genitourinary: Cervix exhibits discharge (bleeding). Cervix exhibits no motion tenderness and no friability. Right adnexum displays no mass and no tenderness. Left adnexum displays no mass and no tenderness.  Musculoskeletal: Normal range of motion.  Neurological: She is alert and oriented to person, place, and time.  Skin: Skin is warm and dry.  Vitals reviewed.   ED Course  Procedures (including critical care time) Labs Review Labs Reviewed  URINALYSIS, ROUTINE W REFLEX MICROSCOPIC - Abnormal;  Notable for the following:    Color, Urine ORANGE (*)    APPearance CLOUDY (*)    Hgb urine dipstick LARGE (*)    Bilirubin Urine SMALL (*)    Protein, ur 100 (*)    Nitrite POSITIVE (*)    Leukocytes, UA LARGE (*)    All other components within normal limits  CBC WITH DIFFERENTIAL/PLATELET - Abnormal; Notable for the following:    RDW 16.0 (*)    All other components within normal limits  BASIC METABOLIC PANEL - Abnormal; Notable for the following:    Potassium 3.4 (*)    GFR calc non Af Amer 76 (*)    GFR calc Af Amer 88 (*)    All other components within normal limits  URINE MICROSCOPIC-ADD ON - Abnormal; Notable for the following:    Squamous Epithelial / LPF FEW (*)    Bacteria, UA MANY (*)    All other components within normal limits  WET PREP, GENITAL  URINE CULTURE  POC URINE PREG, ED  GC/CHLAMYDIA PROBE AMP (Level Green)    Imaging Review No results found.   EKG Interpretation None      MDM   Final diagnoses:  Cystitis    24 y.o. female with pertinent PMH of sjogrens, lupus presents with abd pain as  above.  Pt has had some hematuria.  Exam as above.  Wu with UTI.  This is likely etiology of symptoms.  DC home with prescription for macrobid, no signs of pyelo.  DC home in stable condition I have reviewed all laboratory and imaging studies if ordered as above  1. Cystitis         Mirian Mo, MD 09/30/14 [phone]

## 2014-09-29 NOTE — Telephone Encounter (Signed)
Spoke with patient. She states that she initially felt better after IUD was removed and went to work yesterday. Yesterday afternoon, patient began feeling worse, having increased abdominal pain and notes blood coming from her urethra with each void. She states that she is sure that blood is coming from urethra and not vagina. She states these symptoms are now worse today and she has called in sick to work because of increased hematuria  With large blood clots and pain. Patient was disconnected from triage call and I spoke with Dr. Edward JollySilva and reviewed patient complaints. Per Dr. Edward JollySilva, patient to go to ED ([facility]) for evaluation.   Gonorrhea and Chlamydia tests are still pending.   Patient returned call and she is given instructions from Dr. Edward JollySilva and is agreeable to plan. Patient has a ride that will take her to ED. Patient is scheduled for follow up here in our office tomorrow with Dr. Edward JollySilva.   Routing to provider for final review.

## 2014-09-29 NOTE — Telephone Encounter (Signed)
Message left to return call to Alyissa Whidbee at [phone].    

## 2014-09-29 NOTE — ED Notes (Signed)
Pt states that she has been having lower abd pain that radiates to her "urethra" for weeks.  Pt had her IUD removed 2 days ago thinking that was causing the pain.  Pt states that she felt better immediately after it was removed but since started having abd pain and some bleeding.

## 2014-09-30 ENCOUNTER — Encounter: Payer: Self-pay | Admitting: Obstetrics and Gynecology

## 2014-09-30 ENCOUNTER — Ambulatory Visit (INDEPENDENT_AMBULATORY_CARE_PROVIDER_SITE_OTHER): Payer: Managed Care, Other (non HMO) | Admitting: Obstetrics and Gynecology

## 2014-09-30 VITALS — BP 100/60 | HR 84 | Ht 60.0 in | Wt 97.2 lb

## 2014-09-30 DIAGNOSIS — R319 Hematuria, unspecified: Secondary | ICD-10-CM

## 2014-09-30 LAB — GC/CHLAMYDIA PROBE AMP (~~LOC~~) NOT AT ARMC
Chlamydia: NEGATIVE
Neisseria Gonorrhea: NEGATIVE

## 2014-09-30 LAB — URINE CULTURE: Colony Count: 9000

## 2014-09-30 NOTE — Progress Notes (Signed)
Patient ID: [name], female   DOB: 07/31/1990, 24 y.o.   MRN: [id number] GYNECOLOGY  VISIT   HPI: 24 y.o.   Married  [place]   [phone] with Patient's last menstrual period was 09/09/2014 (exact date).   here for follow up.    Patient seen on 09/27/14 for pelvic pain and IUD removal.  Tender on exam, and patient treated for PID with Levaquin and Flagyl due to history of allergy to PCN with anaphylaxis.  Patient has just filled the Levaquin and has not started it yet.  Did start the Flagyl. Had a negative UC from 09/27/14.  Office called patient to set up follow up appointment for this week, and patient reported blood coming from the urethra.  Was told to go to the ER.  She was treated with hemorrhagic cystitis and started on Macrobid through ER.  UC from 3/30 has also returned negative for bladder infection.  Hgb 13.2. GFR 88.  Pain is improved since IUD removed. Took Ibuprofen for pain.  This just makes her her sleep.  Using an ice pack for the pain.   Has continued bleeding from the urethra.  Has lupus and is concerned about nephritis.   GYNECOLOGIC HISTORY: Patient's last menstrual period was 09/09/2014 (exact date). Contraception:  Abstinence/condoms  Menopausal hormone therapy: n/a        OB History    Gravida Para Term Preterm AB TAB SAB Ectopic Multiple Living   3 1 1  0 1 0 0 1 0 1         Patient Active Problem List   Diagnosis Date Noted  . H/O Graves' disease 07/22/2013  . Sjogren's disease 07/22/2013  . Malabsorption syndrome 07/22/2013  . Migraine with aura 07/22/2013  . Pelvic adhesive disease 07/22/2013  . Hypothyroidism complicating pregnancy / delivered 12/28/2011    Past Medical History  Diagnosis Date  . Hypothyroidism   . Tachycardia   . Preterm contractions 12/07/2011  . Maternal anemia complicating pregnancy, childbirth, or the puerperium 01/03/2012  . Blood transfusion without reported diagnosis 01/02/2012  .  Palpitations   . Sjogren's disease   . History of Graves' disease   . Malabsorption syndrome   . Migraine with typical aura     single sided numbness  . Lupus   . Vitiligo     Past Surgical History  Procedure Laterality Date  . Liver patched    . Intestines removed      newborn  . Knee surgery    . Cesarean section  01/02/2012    Procedure: CESAREAN SECTION;  Surgeon: Lenoard Adenichard J Taavon, MD;  Location: [facility];  Service: Gynecology;  Laterality: N/A;    Current Outpatient Prescriptions  Medication Sig Dispense Refill  . cholestyramine (QUESTRAN) 4 G packet Take 0.5 packets (2 g total) by mouth daily. (Patient taking differently: Take 4 g by mouth daily as needed (loose stool). ) 30 each 12  . hydroxychloroquine (PLAQUENIL) 200 MG tablet Take 400 mg by mouth daily.    . hyoscyamine (LEVBID) 0.375 MG 12 hr tablet Take 1 tablet (0.375 mg total) by mouth 2 (two) times daily. 60 tablet 6  . ibuprofen (ADVIL,MOTRIN) 600 MG tablet Take 600 mg by mouth every 8 (eight) hours as needed.     . iron polysaccharides (NIFEREX) 150 MG capsule Take 150 mg by mouth 2 (two) times daily.    Marland Kitchen. levothyroxine (SYNTHROID, LEVOTHROID) 112 MCG tablet Take 224 mcg by mouth daily.     .Marland Kitchen  metroNIDAZOLE (FLAGYL) 500 MG tablet Take 1 tablet (500 mg total) by mouth 2 (two) times daily. 28 tablet 0  . naproxen (NAPROSYN) 250 MG tablet Take 1 tablet (250 mg total) by mouth 2 (two) times daily with a meal. (Patient taking differently: Take 250 mg by mouth 2 (two) times daily as needed for mild pain or moderate pain. ) 20 tablet 0  . nitrofurantoin, macrocrystal-monohydrate, (MACROBID) 100 MG capsule Take 1 capsule (100 mg total) by mouth 2 (two) times daily. X 7 days 14 capsule 0  . OMEPRAZOLE PO Take 20 mg by mouth at bedtime.     . ondansetron (ZOFRAN ODT) 4 MG disintegrating tablet Take 1 tablet (4 mg total) by mouth every 8 (eight) hours as needed for nausea or vomiting. 10 tablet 0  . pilocarpine (SALAGEN) 5 MG tablet  Take 5 mg by mouth at bedtime.    Marland Kitchen tiZANidine (ZANAFLEX) 4 MG tablet Take 4 mg by mouth every 8 (eight) hours as needed for muscle spasms.     Marland Kitchen levofloxacin (LEVAQUIN) 500 MG tablet Take 1 tablet (500 mg total) by mouth daily. Take one tablet daily for 14 days (Patient not taking: Reported on 09/30/2014) 28 tablet 0   No current facility-administered medications for this visit.     ALLERGIES: Penicillins and Morphine and related  Family History  Problem Relation Age of Onset  . Diabetes Father   . Other Neg Hx   . Colon cancer Neg Hx   . Pancreatic cancer Neg Hx   . Stomach cancer Neg Hx   . Endometriosis Mother   . Heart disease Maternal Grandmother   . Hypertension Maternal Grandmother   . Osteoporosis Maternal Grandmother   . Heart disease Maternal Grandfather   . Hypertension Maternal Grandfather   . Heart disease Paternal Grandmother     History   Social History  . Marital Status: Married    Spouse Name: N/A  . Number of Children: 1  . Years of Education: N/A   Occupational History  . Student    Social History Main Topics  . Smoking status: Never Smoker   . Smokeless tobacco: Never Used  . Alcohol Use: No  . Drug Use: No  . Sexual Activity:    Partners: Male    Birth Control/ Protection: Abstinence, Condom   Other Topics Concern  . Not on file   Social History Narrative    ROS:  Pertinent items are noted in HPI.  PHYSICAL EXAMINATION:    BP 100/60 mmHg  Pulse 84  Ht 5' (1.524 m)  Wt 97 lb 3.2 oz (44.09 kg)  BMI 18.98 kg/m2  LMP 09/09/2014 (Exact Date)    Temp - 97.8 F.  General appearance: alert, cooperative and appears stated age   No abnormal inguinal nodes palpated  Pelvic: External genitalia:  no lesions              Urethra:  normal appearing urethra with no masses, tenderness or lesions              Bartholins and Skenes: normal                 Vagina: normal appearing vagina with normal color and discharge, no lesions               Cervix: normal appearance.  Some mild amount of blood noted.                    Bimanual  Exam:  Uterus:  uterus is normal size, shape, consistency and mildy tender abdominally.                                       Adnexa: normal adnexa in size, nontender and no masses                  Sterile catheterization performed with patient's consent.  Sterile prep of urethra with betadine.  2% xylocaine jelly used on cath tip.  Sterile specimen collected.  Dip positive for large blood.                    ASSESSMENT  Hematuria.  Lupus.  PID.  History of prior Chlamydia.  Current GC/CT are still pending.  Status post IUD removal.   PLAN  Urine micro sent as patient has had vaginal bleeding and I want to confirm that the source of the blood is from urinary system.  Will refer to urology.  May also need nephrology consultation.  I recommend patient stop the Macrobid.  Continue with Levaquin and Flagyl.  Follow up gonorrhea and chlamydia cultures. Follow up in office in 2 weeks.    An After Visit Summary was printed and given to the patient.  ___25___ minutes face to face time of which over 50% was spent in counseling.

## 2014-10-01 ENCOUNTER — Telehealth: Payer: Self-pay | Admitting: Emergency Medicine

## 2014-10-01 LAB — IPS N GONORRHOEA AND CHLAMYDIA BY PCR

## 2014-10-01 LAB — URINALYSIS, MICROSCOPIC ONLY
Bacteria, UA: NONE SEEN
CASTS: NONE SEEN
Crystals: NONE SEEN
RBC / HPF: >50 RBC/hpf — AB (ref <3)
Squamous Epithelial / LPF: NONE SEEN
WBC, UA: >50 WBC/hpf — AB (ref <3)

## 2014-10-01 NOTE — Telephone Encounter (Signed)
Calling [facility] for update on Referral for patient for Gross  Hematuria, cystitis and Hx of Lupus. Spoke with Triage Nurse, Melina Schoolsobyn. She states that patient has seen Dr. Brunilda PayorNesi in the past and she will have to send a message to him to obtain a work in appointment. Dr. Brunilda PayorNesi is not in the office today. She states that patient will be contacted next week for appointment.   Called [facility]. Must leave message for referrals and would be contacted back in 24 hours. Left message with referral information and request for first available appointment at [phone].

## 2014-10-01 NOTE — Telephone Encounter (Signed)
Spoke with patient and message from Dr. Edward JollySilva given. She is agreeable and verbalized understanding of instructions. She states she will continue to monitor bleeding and will return to ER as needed.  Advised she would be contacted with appointments for Urology and Nephrology and patient agreeable.   Routing to provider for final review. Patient agreeable to disposition. Will close encounter

## 2014-10-01 NOTE — Telephone Encounter (Signed)
-----   Message from Patton SallesBrook E Amundson C Silva, MD sent at 10/01/2014 10:52 AM EDT ----- Please report negative gonorrhea and chlamydia cultures to patient.  (see computer.) I do recommend that she continue with the Levaquin and the Flagyl. Will refer to Urology/Nephrology - first available for follow up of gross hematuria.  History of Lupus.  Return to emergency department if no available visits and continuing to have blood in urine.   Cc- Claudette LawsAmanda Dixon

## 2014-10-07 ENCOUNTER — Other Ambulatory Visit (HOSPITAL_BASED_OUTPATIENT_CLINIC_OR_DEPARTMENT_OTHER): Payer: Self-pay | Admitting: Urology

## 2014-10-08 ENCOUNTER — Other Ambulatory Visit: Payer: Self-pay | Admitting: Urology

## 2014-10-14 ENCOUNTER — Encounter (HOSPITAL_BASED_OUTPATIENT_CLINIC_OR_DEPARTMENT_OTHER): Payer: Self-pay | Admitting: *Deleted

## 2014-10-15 NOTE — Telephone Encounter (Signed)
Message left to return call to Imanni Burdine at [phone].    

## 2014-10-15 NOTE — Telephone Encounter (Signed)
Patient states she is returning a call there is no message not sure who called her.

## 2014-10-19 ENCOUNTER — Encounter: Payer: Self-pay | Admitting: Obstetrics and Gynecology

## 2014-10-19 NOTE — Telephone Encounter (Signed)
Please obtain records from [facility] so I can review them.   Thank you!

## 2014-10-19 NOTE — Telephone Encounter (Signed)
Called patient. Advised I was unsure about any additional messages to patient.  Patient states she has seen urology and is being treated for a possible tumor on the inside of her bladder.  She is advised that nephrology referral has been placed but response from [facility] was that it could take 3-4 months to obtain appointment. Patient agreeable. She is going to attempt to locate prior records from [facility] and or [facility].   Patient is schedule for procedure 11/23/14 with [facility].  Routing to provider for final review. Patient agreeable to disposition. Will close encounter

## 2014-10-20 ENCOUNTER — Telehealth: Payer: Self-pay | Admitting: Obstetrics and Gynecology

## 2014-10-20 DIAGNOSIS — R319 Hematuria, unspecified: Secondary | ICD-10-CM

## 2014-10-20 NOTE — Telephone Encounter (Signed)
Patient left a message on answering machine 10/19/14 requesting another referral to [facility]. Last seen 09/30/14.

## 2014-10-20 NOTE — Telephone Encounter (Signed)
Please see telephone encounter from 10/19/2014 regarding mychart message.  Routing to provider for final review. Patient agreeable to disposition. Will close encounter

## 2014-10-20 NOTE — Telephone Encounter (Signed)
Called [facility] and spoke with Junious Dresseronnie. Requests requested from visit on 10/07/14.

## 2014-10-20 NOTE — Telephone Encounter (Signed)
Spoke with patient. Patient states that she will have new insurance as of Oct 31, 2014 and will need another referral to [facility] in order to be covered with her insurance. Patient was last seen on 09/30/2014 with Dr.Silva for Hematuria and Urology evaluation was recommended. New referral to [facility] placed. Patient is agreeable and verbalizes understanding. Advised will be notified of appointment date and time by referrals coordinator in our office or [facility] directly. Patient is agreeable.  Routing to provider for final review. Patient agreeable to disposition. Will close encounter

## 2014-11-09 ENCOUNTER — Telehealth: Payer: Self-pay | Admitting: Emergency Medicine

## 2014-11-09 NOTE — Telephone Encounter (Signed)
Spoke with patient. Advised of message from [facility]. Patient has a new patient appointment on 11/11/14 at 1100 with Dr. Alycia Rossettiyan B. Sanford.   Patient states she is aware of appointment and will be there. Routing to provider for final review. Patient agreeable to disposition.  Will close encounter.

## 2014-11-18 ENCOUNTER — Encounter (HOSPITAL_BASED_OUTPATIENT_CLINIC_OR_DEPARTMENT_OTHER): Payer: Self-pay | Admitting: *Deleted

## 2014-11-18 NOTE — Progress Notes (Signed)
NPO AFTER MN.  ARRIVE AT 0715.  NEEDS HG AND URINE PREG.  WILL TAKE AM MEDS W/ SIPS OF WATER.  

## 2014-11-22 NOTE — H&P (Signed)
hief Complaint Gross hematuria   History of Present Illness 24 year old female who was a prior patient of Dr. Su Grand last seen in 2009 for recurrent UTIs and unaware incontinence.     Seen by her Family MD on 06/04/2014 with several days of dysuria, frequency, urgency, and gross hematuria as well as achy suprapubic pain. Urine culture was positive with E. coli (pan-S other than Amp (R) and Augmentin (I), and treated with ciprofloxacin. She was also diagnosed with and treated for Chlamydia at that time (although chlamydia PCR was ultimately negative). She returned to the ER 3 days later with continued symptoms. UCx was negative. CT non-con was negative for stones or other pathology.     She was then seen by her GYN in 08/2014 with pelvic pain. She was treated for PID with 14 days of levo and flagyl and her IUD was removed. Repeat UCx at that time was negative.    She does have recurrent microscopic and gross hematuria on multiple microscopic urinalyses.Typically there are >50 or too numerous to count RBC/hpf.     Today she reports that her frequent UTIs have resolved. She does not have significant of frequent incontinence. She does endorse several episodes in the past year of gross hematuria that follows a pattern. She reports that when she is stressed, she has urinary frequency, urgency, feeling of incomplete emptying, bladder pain that is typically after urination. Gross hematuria occurs when she continues to have the sensation of needing to void after she finishes and strains to double void. She endorses nocturia 3-4 times per night. She is currently on levaquin and flagyl. She has been on the levaquin for 2 weeks.   Past Medical History Problems  1. History of Anxiety (F41.9) 2. History of Arthritis 3. History of bleeding disorder (Z86.2) 4. History of esophageal reflux (Z87.19) 5. History of Graves' disease (Z86.39) 6. History of hypothyroidism (Z86.39) 7. History of intestinal  malabsorption (Z87.19) 8. History of Sjogren's disease (Z87.39) 9. History of systemic lupus erythematosus (SLE) (Z87.39) 10. History of Vitiligo (L80)  Surgical History Problems  1. History of Bronchoscopy 2. History of Cesarean Section 3. History of Intestinal Surgery 4. History of Knee Surgery 5. History of Liver Surgery  Current Meds 1. Cholestyramine 4 GM Oral Packet;  Therapy: (Recorded:07Apr2016) to Recorded 2. Hydroxychloroquine Sulfate 200 MG Oral Tablet;  Therapy: (Recorded:07Apr2016) to Recorded 3. Hyoscyamine Sulfate ER 0.375 MG Oral Tablet Extended Release 12 Hour;  Therapy: (Recorded:07Apr2016) to Recorded 4. Ibuprofen TABS;  Therapy: (Recorded:07Apr2016) to Recorded 5. Levofloxacin 500 MG Oral Tablet;  Therapy: (Recorded:07Apr2016) to Recorded 6. MetroNIDAZOLE 500 MG Oral Tablet;  Therapy: (Recorded:07Apr2016) to Recorded 7. Naproxen 250 MG Oral Tablet;  Therapy: (Recorded:07Apr2016) to Recorded 8. Nitrofurantoin Monohyd Macro 100 MG Oral Capsule; TAKE 1 CAPSULE TWICE DAILY;  Therapy: 15Jun2009 to (Evaluate:22Jun2009); Last Rx:15Jun2009 Ordered 9. Omeprazole 20 MG Oral Tablet Delayed Release;  Therapy: (Recorded:07Apr2016) to Recorded 10. Ondansetron HCl - 4 MG Oral Tablet;   Therapy: (Recorded:07Apr2016) to Recorded 11. Pilocarpine HCl - 5 MG Oral Tablet;   Therapy: (Recorded:07Apr2016) to Recorded 12. Synthroid 112 MCG Oral Tablet;   Therapy: (Recorded:24Feb2009) to Recorded 13. TiZANidine HCl - 4 MG Oral Capsule;   Therapy: (Recorded:07Apr2016) to Recorded  Allergies Medication  1. Morphine Derivatives 2. Penicillins  Family History Problems  1. Family history of Family Health Status - Father's Age   29 2. Family history of Family Health Status - Mother's Age   29  Social History Problems  Denied: History of Alcohol Use   Denied: History of Caffeine Use   Marital History - Single   One child   Denied: History of Tobacco  Use  Review of Systems Skin, eye, otolaryngeal, hematologic/lymphatic, endocrine, musculoskeletal and psychiatric system(s) were reviewed and pertinent findings if present are noted and are otherwise negative.  Gastrointestinal: nausea, vomiting, heartburn and diarrhea.  Constitutional: feeling tired (fatigue) and recent weight loss.  Neurological: headache.    Vitals Vital Signs [Data Includes: Last 1 Day]  Recorded: 07Apr2016 01:40PM  Height: 5 ft  Weight: 96 lb  BMI Calculated: 18.75 BSA Calculated: 1.37 Blood Pressure: 102 / 70 Temperature: 97.9 F Heart Rate: 87  Physical Exam AAOx3, in NAD  Moist mucus membranes  Normal appearance  Normal WOB  No peripheral edema  Abdomen: SOft, NT/ND, no masses  Genitourinary: Chaperone: Tawnya CrookErica Wheless. Normal external genitalia, normal vaginal mucosa, no prolapse or urethral hypermobility, no SUI on cough test.   Lymphatics: None palpable  Vitiligo noted on face and hands  Intact    Results/Data Urine [Data Includes: Last 1 Day]   07Apr2016  COLOR YELLOW   APPEARANCE CLOUDY   SPECIFIC GRAVITY 1.020   pH 6.0   GLUCOSE NEG mg/dL  BILIRUBIN NEG   KETONE NEG mg/dL  BLOOD LARGE   PROTEIN 30 mg/dL  UROBILINOGEN 0.2 mg/dL  NITRITE NEG   LEUKOCYTE ESTERASE SMALL   SQUAMOUS EPITHELIAL/HPF MODERATE   WBC 7-10 WBC/hpf  RBC TNTC RBC/hpf  BACTERIA FEW   CRYSTALS NONE SEEN   CASTS NONE SEEN   PVR = 0    Procedure  Procedure: Cystoscopy . Chaperone Tawnya CrookErica Wheless   Indication: Hematuria. Lower Urinary Tract Symptoms.  Informed Consent: Risks, benefits, and potential adverse events were discussed and informed consent was obtained from the patient.  Prep: The patient was prepped with betadine.  Anesthesia:. Local anesthesia was administered intraurethrally with 2% lidocaine jelly.  Procedure Note:  Urethral meatus:. No abnormalities.  Anterior urethra: No abnormalities.  Bladder: Visulization was clear. There was a focal area  covering the majority of the right bladder wall that appeared edematous and friable. A small amount of active bloody ooze was noted. The appearance favors inflammation over tumor, but the focality of it was unusual.    Assessment Gross hematuria, abnormal cystoscopy on antibiotics. Likely a benign inflammatory lesion such as nephrogenic adenoma or fibrous inflammatory pseudotumor.   Plan  Benign neoplasm of bladder  1. Follow-up Schedule Surgery Office  Follow-up  Status: Complete  Done: 07Apr2016 Health Maintenance  2. UA With REFLEX; [Do Not Release]; Status:Resulted - Requires Verification;   Done:  07Apr2016 01:27PM Urinary tract infection  3. PVR U/S; Status:Hold For - Appointment,Date of Service; Requested for:07Apr2016;   1. Urine culture today.   2. Urine cytology today  3. TURBT next available. Discussed risks and benefits, including bleeding, infection, bladder perforation. Benefits include diagnosis of bladder lesion and possible relief of symptoms, but that was by no means guaranteed. Will follow up 1 week post op for path discussion. If lesion is negative and symptoms persists, would recommend treatment for bladder overactivity.   4. Has nephrology referral that I encouraged her to follow up with    URINE CULTURE; Status:In Progress - Specimen/Data Collected;  Done: 07Apr2016  Perform:[facility]; Due:09Apr2016; Marked Important; Last Updated XB:JYNWGNBy:Deacon, Debbie; 10/07/2014 3:07:03 PM;Ordered; Today;   FAO:ZHYQMVFor:Benign neoplasm of bladder; Ordered HQ:IONGEXBBy:Ottelin, Mark;

## 2014-11-23 ENCOUNTER — Ambulatory Visit (HOSPITAL_BASED_OUTPATIENT_CLINIC_OR_DEPARTMENT_OTHER): Payer: 59 | Admitting: Anesthesiology

## 2014-11-23 ENCOUNTER — Encounter (HOSPITAL_BASED_OUTPATIENT_CLINIC_OR_DEPARTMENT_OTHER)
Admission: RE | Disposition: A | Discharge status: Home / Self Care | Payer: Self-pay | Source: Ambulatory Visit | Attending: Urology

## 2014-11-23 ENCOUNTER — Encounter (HOSPITAL_BASED_OUTPATIENT_CLINIC_OR_DEPARTMENT_OTHER): Payer: Self-pay | Admitting: Anesthesiology

## 2014-11-23 ENCOUNTER — Ambulatory Visit (HOSPITAL_BASED_OUTPATIENT_CLINIC_OR_DEPARTMENT_OTHER)
Admission: RE | Admit: 2014-11-23 | Discharge: 2014-11-23 | Disposition: A | Discharge status: Home / Self Care | Payer: 59 | Source: Ambulatory Visit | Attending: Urology | Admitting: Urology

## 2014-11-23 DIAGNOSIS — K219 Gastro-esophageal reflux disease without esophagitis: Secondary | ICD-10-CM | POA: Diagnosis not present

## 2014-11-23 DIAGNOSIS — M329 Systemic lupus erythematosus, unspecified: Secondary | ICD-10-CM | POA: Insufficient documentation

## 2014-11-23 DIAGNOSIS — D649 Anemia, unspecified: Secondary | ICD-10-CM | POA: Insufficient documentation

## 2014-11-23 DIAGNOSIS — Z791 Long term (current) use of non-steroidal anti-inflammatories (NSAID): Secondary | ICD-10-CM | POA: Insufficient documentation

## 2014-11-23 DIAGNOSIS — R51 Headache: Secondary | ICD-10-CM | POA: Insufficient documentation

## 2014-11-23 DIAGNOSIS — M3500 Sicca syndrome, unspecified: Secondary | ICD-10-CM | POA: Diagnosis not present

## 2014-11-23 DIAGNOSIS — E0500 Thyrotoxicosis with diffuse goiter without thyrotoxic crisis or storm: Secondary | ICD-10-CM | POA: Diagnosis not present

## 2014-11-23 DIAGNOSIS — N3021 Other chronic cystitis with hematuria: Secondary | ICD-10-CM | POA: Diagnosis not present

## 2014-11-23 DIAGNOSIS — M797 Fibromyalgia: Secondary | ICD-10-CM | POA: Insufficient documentation

## 2014-11-23 DIAGNOSIS — Z79899 Other long term (current) drug therapy: Secondary | ICD-10-CM | POA: Insufficient documentation

## 2014-11-23 DIAGNOSIS — R319 Hematuria, unspecified: Secondary | ICD-10-CM | POA: Diagnosis present

## 2014-11-23 DIAGNOSIS — L80 Vitiligo: Secondary | ICD-10-CM | POA: Diagnosis not present

## 2014-11-23 DIAGNOSIS — E039 Hypothyroidism, unspecified: Secondary | ICD-10-CM | POA: Diagnosis not present

## 2014-11-23 HISTORY — DX: Other fecal abnormalities: R19.5

## 2014-11-23 HISTORY — DX: Iron deficiency anemia, unspecified: D50.9

## 2014-11-23 HISTORY — DX: Unspecified symptoms and signs involving the genitourinary system: R39.9

## 2014-11-23 HISTORY — DX: Fibromyalgia: M79.7

## 2014-11-23 HISTORY — DX: Other specified disorders of bladder: N32.89

## 2014-11-23 HISTORY — DX: Personal history of urinary (tract) infections: Z87.440

## 2014-11-23 HISTORY — DX: Personal history of other diseases of the nervous system and sense organs: Z86.69

## 2014-11-23 HISTORY — PX: CYSTOSCOPY WITH BIOPSY: SHX5122

## 2014-11-23 HISTORY — DX: Gross hematuria: R31.0

## 2014-11-23 HISTORY — DX: Short bowel syndrome, unspecified: K90.829

## 2014-11-23 HISTORY — DX: Gastro-esophageal reflux disease without esophagitis: K21.9

## 2014-11-23 HISTORY — DX: Presence of spectacles and contact lenses: Z97.3

## 2014-11-23 HISTORY — DX: Personal history of irradiation: Z92.3

## 2014-11-23 HISTORY — DX: Postsurgical malabsorption, not elsewhere classified: K91.2

## 2014-11-23 LAB — POCT HEMOGLOBIN-HEMACUE: Hemoglobin: 12 g/dL (ref 12.0–15.0)

## 2014-11-23 LAB — POCT PREGNANCY, URINE: Preg Test, Ur: NEGATIVE

## 2014-11-23 SURGERY — CYSTOSCOPY, WITH BIOPSY
Anesthesia: General | Site: Bladder

## 2014-11-23 MED ORDER — FENTANYL CITRATE (PF) 100 MCG/2ML IJ SOLN
INTRAMUSCULAR | Status: AC
  Filled 2014-11-23: qty 4

## 2014-11-23 MED ORDER — HYDROCODONE-ACETAMINOPHEN 7.5-325 MG PO TABS
ORAL_TABLET | ORAL | Status: AC
  Filled 2014-11-23: qty 1

## 2014-11-23 MED ORDER — LIDOCAINE HCL (CARDIAC) 20 MG/ML IV SOLN
INTRAVENOUS | Status: DC | PRN
  Administered 2014-11-23: 80 mg via INTRAVENOUS

## 2014-11-23 MED ORDER — DEXAMETHASONE SODIUM PHOSPHATE 10 MG/ML IJ SOLN
INTRAMUSCULAR | Status: DC | PRN
  Administered 2014-11-23: 10 mg via INTRAVENOUS

## 2014-11-23 MED ORDER — HYDROCODONE-ACETAMINOPHEN 5-325 MG PO TABS: 1.0000 | ORAL_TABLET | Freq: Four times a day (QID) | ORAL | Status: DC | PRN

## 2014-11-23 MED ORDER — FENTANYL CITRATE (PF) 100 MCG/2ML IJ SOLN
INTRAMUSCULAR | Status: DC | PRN
  Administered 2014-11-23: 25 ug via INTRAVENOUS
  Administered 2014-11-23 (×2): 12.5 ug via INTRAVENOUS
  Administered 2014-11-23 (×2): 25 ug via INTRAVENOUS

## 2014-11-23 MED ORDER — LACTATED RINGERS IV SOLN
INTRAVENOUS | Status: DC
  Administered 2014-11-23 (×2): via INTRAVENOUS
  Filled 2014-11-23: qty 1000

## 2014-11-23 MED ORDER — ROCURONIUM BROMIDE 100 MG/10ML IV SOLN
INTRAVENOUS | Status: DC | PRN
  Administered 2014-11-23: 20 mg via INTRAVENOUS

## 2014-11-23 MED ORDER — NEOSTIGMINE METHYLSULFATE 10 MG/10ML IV SOLN
INTRAVENOUS | Status: DC | PRN
  Administered 2014-11-23: 4 mg via INTRAVENOUS

## 2014-11-23 MED ORDER — PROPOFOL 10 MG/ML IV BOLUS
INTRAVENOUS | Status: DC | PRN
  Administered 2014-11-23: 160 mg via INTRAVENOUS

## 2014-11-23 MED ORDER — ACETAMINOPHEN 10 MG/ML IV SOLN
INTRAVENOUS | Status: DC | PRN
  Administered 2014-11-23: 1000 mg via INTRAVENOUS

## 2014-11-23 MED ORDER — ONDANSETRON HCL 4 MG/2ML IJ SOLN
INTRAMUSCULAR | Status: DC | PRN
  Administered 2014-11-23: 4 mg via INTRAVENOUS

## 2014-11-23 MED ORDER — CIPROFLOXACIN IN D5W 400 MG/200ML IV SOLN
400.0000 mg | INTRAVENOUS | Status: AC
  Administered 2014-11-23: 400 mg via INTRAVENOUS
  Filled 2014-11-23: qty 200

## 2014-11-23 MED ORDER — HYDROCODONE-ACETAMINOPHEN 5-325 MG PO TABS
ORAL_TABLET | ORAL | Status: AC
  Filled 2014-11-23: qty 1

## 2014-11-23 MED ORDER — MIDAZOLAM HCL 2 MG/2ML IJ SOLN
INTRAMUSCULAR | Status: AC
  Filled 2014-11-23: qty 2

## 2014-11-23 MED ORDER — GLYCOPYRROLATE 0.2 MG/ML IJ SOLN
INTRAMUSCULAR | Status: DC | PRN
  Administered 2014-11-23: 0.6 mg via INTRAVENOUS

## 2014-11-23 MED ORDER — STERILE WATER FOR IRRIGATION IR SOLN
Status: DC | PRN
  Administered 2014-11-23: 3000 mL

## 2014-11-23 MED ORDER — FENTANYL CITRATE (PF) 100 MCG/2ML IJ SOLN
25.0000 ug | INTRAMUSCULAR | Status: DC | PRN
  Filled 2014-11-23: qty 1

## 2014-11-23 MED ORDER — PROMETHAZINE HCL 25 MG/ML IJ SOLN
6.2500 mg | INTRAMUSCULAR | Status: DC | PRN
  Filled 2014-11-23: qty 1

## 2014-11-23 MED ORDER — HYDROCODONE-ACETAMINOPHEN 5-325 MG PO TABS
1.0000 | ORAL_TABLET | Freq: Four times a day (QID) | ORAL | Status: DC | PRN
  Administered 2014-11-23: 1 via ORAL
  Filled 2014-11-23: qty 1

## 2014-11-23 MED ORDER — CIPROFLOXACIN IN D5W 400 MG/200ML IV SOLN
INTRAVENOUS | Status: AC
  Filled 2014-11-23: qty 200

## 2014-11-23 SURGICAL SUPPLY — 34 items
BAG DRAIN URO-CYSTO SKYTR STRL (DRAIN) ×4 IMPLANT
BAG DRN ANRFLXCHMBR STRAP LEK (BAG)
BAG DRN UROCATH (DRAIN) ×2
BAG URINE DRAINAGE (UROLOGICAL SUPPLIES) IMPLANT
BAG URINE LEG 19OZ MD ST LTX (BAG) IMPLANT
CANISTER SUCT LVC 12 LTR MEDI- (MISCELLANEOUS) ×3 IMPLANT
CATH FOLEY 2WAY SLVR  5CC 16FR (CATHETERS)
CATH FOLEY 2WAY SLVR  5CC 20FR (CATHETERS)
CATH FOLEY 2WAY SLVR  5CC 22FR (CATHETERS)
CATH FOLEY 2WAY SLVR 5CC 16FR (CATHETERS) ×1 IMPLANT
CATH FOLEY 2WAY SLVR 5CC 20FR (CATHETERS) IMPLANT
CATH FOLEY 2WAY SLVR 5CC 22FR (CATHETERS) IMPLANT
CLOTH BEACON ORANGE TIMEOUT ST (SAFETY) ×4 IMPLANT
ELECT REM PT RETURN 9FT ADLT (ELECTROSURGICAL) ×4
ELECTRODE REM PT RTRN 9FT ADLT (ELECTROSURGICAL) ×2 IMPLANT
GLOVE BIOGEL PI IND STRL 6.5 (GLOVE) ×1 IMPLANT
GLOVE BIOGEL PI IND STRL 8.5 (GLOVE) ×1 IMPLANT
GLOVE BIOGEL PI INDICATOR 6.5 (GLOVE) ×2
GLOVE BIOGEL PI INDICATOR 8.5 (GLOVE) ×2
GLOVE SURG SS PI 6.5 STRL IVOR (GLOVE) ×3 IMPLANT
GLOVE SURG SS PI 7.5 STRL IVOR (GLOVE) ×3 IMPLANT
GLOVE SURG SS PI 8.0 STRL IVOR (GLOVE) ×4 IMPLANT
GOWN STRL REUS W/ TWL XL LVL3 (GOWN DISPOSABLE) ×3 IMPLANT
GOWN STRL REUS W/TWL XL LVL3 (GOWN DISPOSABLE) ×8
HOLDER FOLEY CATH W/STRAP (MISCELLANEOUS) IMPLANT
NDL SAFETY ECLIPSE 18X1.5 (NEEDLE) ×1 IMPLANT
NEEDLE HYPO 18GX1.5 SHARP (NEEDLE)
NS IRRIG 500ML POUR BTL (IV SOLUTION) IMPLANT
PACK CYSTO (CUSTOM PROCEDURE TRAY) ×4 IMPLANT
PLUG CATH AND CAP STER (CATHETERS) IMPLANT
SET ASPIRATION TUBING (TUBING) IMPLANT
SYR 30ML LL (SYRINGE) ×1 IMPLANT
SYRINGE IRR TOOMEY STRL 70CC (SYRINGE) IMPLANT
WATER STERILE IRR 3000ML UROMA (IV SOLUTION) ×4 IMPLANT

## 2014-11-23 NOTE — Interval H&P Note (Signed)
History and Physical Interval Note:  11/23/2014 8:36 AM  [name]  has presented today for surgery, with the diagnosis of BLADDER MASS  The various methods of treatment have been discussed with the patient and family. After consideration of risks, benefits and other options for treatment, the patient has consented to  Procedure(s): CYSTO (N/A) TRANSURETHRAL RESECTION OF BLADDER TUMOR (TURBT) (N/A) as a surgical intervention .  The patient's history has been reviewed, patient examined, no change in status, stable for surgery.  I have reviewed the patient's chart and labs.  Questions were answered to the patient's satisfaction.     Reginia Battie J

## 2014-11-23 NOTE — Op Note (Addendum)
Preoperative diagnosis: Hematuria  Postoperative diagnosis: Hematuria  Procedure:  1. Cystoscopy 2. Bladder biopsy and fulguration  Surgeon: Annabell HowellsWrenn, MD  Resident: Adela LankE. Will Kirby, MD  Anesthesia: General  Complications: None  Intraoperative findings: Erythematous patch of tissue along the right lateral wall of the bladder.  Diffuse inflammatory changes along the right lateral wall, no ulcerations notes.  Hyperemic mucosa throughout the bladder.  No papillary lesions noted.      EBL: Minimal  Specimens: 1. Right lateral wall cold cup biopsies x2  Indication: [name] is a 24 y.o. female patient with lupus and Sjogren syndrome as well as irritative bladder symptoms and gross hematuria in the context of negative urine cultures. She underwent local cystoscopy in the clinic and was found to have a erythematous lesion along the right side of the bladder. This appeared to be inflammatory however given the focality decision was made to proceed with biopsy.  Description of procedure:  The patient was taken to the operating room and general anesthesia was induced.  The patient was placed in the dorsal lithotomy position, prepped and draped in the usual sterile fashion, and preoperative antibiotics were administered. A preoperative time-out was performed.   Cystourethroscopy was performed.  The patient's urethra was examined and was normal.  The bladder was systematically evaluated and findings as described above.  Erythematous patch of tissue along the right lateral wall of the bladder.  Diffuse inflammatory changes along the right lateral wall, no ulcerations notes.  Hyperemic mucosa throughout the bladder.  No papillary lesions noted.   Cold cup biopsy forcep was then used to take 2 separate bites of the right-sided lesion. Bugbee fulguration was then used to fulgurate these areas with good hemostasis. Following filling and emptying of the bladder, we did appreciate significant  hyperemia and hypervascularity throughout the bladder.  The bladder was then emptied and the procedure ended.  The patient appeared to tolerate the procedure well and without complications.  The patient was able to be awakened and transferred to the recovery unit in satisfactory condition.

## 2014-11-23 NOTE — Discharge Instructions (Addendum)
CYSTOSCOPY HOME CARE INSTRUCTIONS  Activity: Rest for the remainder of the day.  Do not drive or operate equipment today.  You may resume normal activities in one to two days as instructed by your physician.   Meals: Drink plenty of liquids and eat light foods such as gelatin or soup this evening.  You may return to a normal meal plan tomorrow.  Return to Work: You may return to work in one to two days or as instructed by your physician.  Special Instructions / Symptoms: Call your physician if any of these symptoms occur:   -persistent or heavy bleeding  -large blood clots that are difficult to pass  -urine stream diminishes or stops completely  -fever equal to or higher than 101 degrees Farenheit.  -cloudy urine with a strong, foul odor  Females should always wipe from front to back after elimination.  You may feel some burning pain when you urinate.  This should disappear with time.  Applying moist heat to the lower abdomen or a hot tub bath may help relieve the pain.    Patient Signature:  ________________________________________________________  Nurse's Signature:  ________________________________________________________   Post Anesthesia Home Care Instructions  Activity: Get plenty of rest for the remainder of the day. A responsible adult should stay with you for 24 hours following the procedure.  For the next 24 hours, DO NOT: -Drive a car -Advertising copywriterperate machinery -Drink alcoholic beverages -Take any medication unless instructed by your physician -Make any legal decisions or sign important papers.  Meals: Start with liquid foods such as gelatin or soup. Progress to regular foods as tolerated. Avoid greasy, spicy, heavy foods. If nausea and/or vomiting occur, drink only clear liquids until the nausea and/or vomiting subsides. Call your physician if vomiting continues.  Special Instructions/Symptoms: Your throat may feel dry or sore from the anesthesia or the breathing tube  placed in your throat during surgery. If this causes discomfort, gargle with warm salt water. The discomfort should disappear within 24 hours.  If you had a scopolamine patch placed behind your ear for the management of post- operative nausea and/or vomiting:  1. The medication in the patch is effective for 72 hours, after which it should be removed.  Wrap patch in a tissue and discard in the trash. Wash hands thoroughly with soap and water. 2. You may remove the patch earlier than 72 hours if you experience unpleasant side effects which may include dry mouth, dizziness or visual disturbances. 3. Avoid touching the patch. Wash your hands with soap and water after contact with the patch.

## 2014-11-23 NOTE — Anesthesia Preprocedure Evaluation (Signed)
Anesthesia Evaluation  Patient identified by MRN, date of birth, ID band Patient awake  General Assessment Comment:Lupus, Sjogren's  Reviewed: Allergy & Precautions, NPO status , Patient's Chart, lab work & pertinent test results  Airway Mallampati: II  TM Distance: >3 FB Neck ROM: Full    Dental no notable dental hx.    Pulmonary neg pulmonary ROS,  breath sounds clear to auscultation  Pulmonary exam normal       Cardiovascular Exercise Tolerance: Good negative cardio ROS Normal cardiovascular examRhythm:Regular Rate:Normal     Neuro/Psych  Headaches,  Neuromuscular disease negative psych ROS   GI/Hepatic Neg liver ROS, GERD-  Medicated and Controlled,  Endo/Other  Hypothyroidism H/O Grave's s/p ablation  Renal/GU negative Renal ROS  negative genitourinary   Musculoskeletal  (+) Fibromyalgia -  Abdominal   Peds negative pediatric ROS (+)  Hematology  (+) anemia ,   Anesthesia Other Findings   Reproductive/Obstetrics negative OB ROS                             Anesthesia Physical Anesthesia Plan  ASA: II  Anesthesia Plan: General   Post-op Pain Management:    Induction: Intravenous  Airway Management Planned: LMA  Additional Equipment:   Intra-op Plan:   Post-operative Plan: Extubation in OR  Informed Consent: I have reviewed the patients History and Physical, chart, labs and discussed the procedure including the risks, benefits and alternatives for the proposed anesthesia with the patient or authorized representative who has indicated his/her understanding and acceptance.   Dental advisory given  Plan Discussed with: CRNA  Anesthesia Plan Comments:         Anesthesia Quick Evaluation

## 2014-11-23 NOTE — Transfer of Care (Signed)
Immediate Anesthesia Transfer of Care Note  Patient: [name]  Procedure(s) Performed: Procedure(s) (LRB): CYSTOSCOPY WITH BLADDER  BIOPSY AND FULGERATION (N/A)  Patient Location: PACU  Anesthesia Type: General  Level of Consciousness: awake, sedated, patient cooperative and responds to stimulation  Airway & Oxygen Therapy: Patient Spontanous Breathing and Patient connected to face mask oxygen  Post-op Assessment: Report given to PACU RN, Post -op Vital signs reviewed and stable and Patient moving all extremities  Post vital signs: Reviewed and stable  Complications: No apparent anesthesia complications

## 2014-11-23 NOTE — Anesthesia Postprocedure Evaluation (Signed)
  Anesthesia Post-op Note  Patient: [name]  Procedure(s) Performed: Procedure(s) (LRB): CYSTOSCOPY WITH BLADDER  BIOPSY AND FULGERATION (N/A)  Patient Location: PACU  Anesthesia Type: General  Level of Consciousness: awake and alert   Airway and Oxygen Therapy: Patient Spontanous Breathing  Post-op Pain: mild  Post-op Assessment: Post-op Vital signs reviewed, Patient's Cardiovascular Status Stable, Respiratory Function Stable, Patent Airway and No signs of Nausea or vomiting  Last Vitals:  Filed Vitals:   11/23/14 1130  BP: 128/84  Pulse: 60  Temp: 36.6 C  Resp: 14    Post-op Vital Signs: stable   Complications: No apparent anesthesia complications

## 2014-11-23 NOTE — Anesthesia Procedure Notes (Signed)
Procedure Name: LMA Insertion Date/Time: 11/23/2014 8:35 AM Performed by: Jamie PriestBEESON, Jamie Beitzel C Pre-anesthesia Checklist: Patient identified, Emergency Drugs available, Suction available and Patient being monitored Patient Re-evaluated:Patient Re-evaluated prior to inductionOxygen Delivery Method: Circle System Utilized Preoxygenation: Pre-oxygenation with 100% oxygen Intubation Type: IV induction Ventilation: Mask ventilation without difficulty LMA: LMA inserted LMA Size: 4.0 Number of attempts: 1 Airway Equipment and Method: Bite block Placement Confirmation: positive ETCO2 Tube secured with: Tape Dental Injury: Teeth and Oropharynx as per pre-operative assessment

## 2014-11-23 NOTE — Interval H&P Note (Signed)
Patient seen and updated.  H&P reviewed and accurate.  Will plan for cystoscopy and biopsy today.  No changes to plan or H&P.

## 2014-11-24 ENCOUNTER — Encounter (HOSPITAL_BASED_OUTPATIENT_CLINIC_OR_DEPARTMENT_OTHER): Payer: Self-pay | Admitting: Urology

## 2014-12-06 ENCOUNTER — Encounter: Payer: Self-pay | Admitting: Obstetrics and Gynecology

## 2015-01-15 ENCOUNTER — Other Ambulatory Visit: Payer: Self-pay | Admitting: Obstetrics and Gynecology

## 2015-01-17 ENCOUNTER — Encounter: Payer: Self-pay | Admitting: Obstetrics and Gynecology

## 2015-01-17 ENCOUNTER — Ambulatory Visit (INDEPENDENT_AMBULATORY_CARE_PROVIDER_SITE_OTHER): Payer: 59 | Admitting: Obstetrics and Gynecology

## 2015-01-17 VITALS — BP 100/78 | HR 104 | Resp 16 | Wt 99.0 lb

## 2015-01-17 DIAGNOSIS — R8290 Unspecified abnormal findings in urine: Secondary | ICD-10-CM

## 2015-01-17 DIAGNOSIS — N3011 Interstitial cystitis (chronic) with hematuria: Secondary | ICD-10-CM | POA: Diagnosis not present

## 2015-01-17 DIAGNOSIS — N94819 Vulvodynia, unspecified: Secondary | ICD-10-CM | POA: Diagnosis not present

## 2015-01-17 DIAGNOSIS — R319 Hematuria, unspecified: Secondary | ICD-10-CM

## 2015-01-17 DIAGNOSIS — R300 Dysuria: Secondary | ICD-10-CM

## 2015-01-17 DIAGNOSIS — N3090 Cystitis, unspecified without hematuria: Secondary | ICD-10-CM

## 2015-01-17 DIAGNOSIS — N942 Vaginismus: Secondary | ICD-10-CM

## 2015-01-17 DIAGNOSIS — R350 Frequency of micturition: Secondary | ICD-10-CM

## 2015-01-17 LAB — POCT URINALYSIS DIPSTICK
Bilirubin, UA: NEGATIVE
GLUCOSE UA: NEGATIVE
Ketones, UA: NEGATIVE
Nitrite, UA: NEGATIVE
Protein, UA: NEGATIVE
SPEC GRAV UA: 1.015
Urobilinogen, UA: NEGATIVE
pH, UA: 6.5

## 2015-01-17 MED ORDER — NITROFURANTOIN MONOHYD MACRO 100 MG PO CAPS: 100.0000 mg | ORAL_CAPSULE | Freq: Two times a day (BID) | ORAL | Status: DC

## 2015-01-17 NOTE — Telephone Encounter (Signed)
LMTCB regarding Flagy medication refill.  Patient need an AEX with Dr. Edward JollySilva.

## 2015-01-17 NOTE — Progress Notes (Signed)
Patient ID: [name], female   DOB: July 08, 1990, 24 y.o.   MRN: [id number] GYNECOLOGY  VISIT   HPI: 24 y.o.   Married  [place]   [phone] with Patient's last menstrual period was 01/17/2015.   Here complaining of dysuria, urinary frequency and vaginal pain. Patient has a HX of frequent UTIs. The patient states she is getting UTI's around her cycles. She hasn't been sexually active since last year, used to get UTI's with intercourse. She has constant pain from her bladder, h/o interstitial cystitis. Her bladder, urethra and vagina hurts when she sits and walks. Always more tender after urination. She also c/o an intermittent sharp vaginal pain, feels like a spasm. Hurts all the time for about a year. No abnormal vaginal d/c, no itching. She hurts in the clitoris and vaginal area with arousal. Feels like a vaginal ache. She c/o a 1 week h/o urinary frequency, urgency and dysuria. She has had hematuria x 2 weeks. Urologist is aware. She had a cystoscopy and biopsy of her bladder in May. No fevers, chills or flank pain. The suprapubic region is uncomfortable  GYNECOLOGIC HISTORY: Patient's last menstrual period was 01/17/2015. Contraception:None Menopausal hormone therapy: N/A Last mammogram: N/A Last pap smear: 07-22-13 WNL         OB History    Gravida Para Term Preterm AB TAB SAB Ectopic Multiple Living   3 1 1  0 1 0 0 1 0 1         Patient Active Problem List   Diagnosis Date Noted  . Chronic interstitial cystitis with hematuria   . H/O Graves' disease 07/22/2013  . Sjogren's disease 07/22/2013  . Malabsorption syndrome 07/22/2013  . Migraine with aura 07/22/2013  . Pelvic adhesive disease 07/22/2013  . Hypothyroidism complicating pregnancy / delivered 12/28/2011    Past Medical History  Diagnosis Date  . Hypothyroidism   . Sjogren's disease   . History of Graves' disease     tx done in 2007  . Malabsorption syndrome   . Lupus     rheumologist--  dr  Janalyn Rouseshaili devashwer  . Vitiligo   . Bladder mass   . Passage of loose stools     chronic --  due to short gut syndrome  . SGS (short gut syndrome)   . Gross hematuria   . History of recurrent UTIs   . History of migraine   . Iron deficiency anemia     due to SGS  . GERD (gastroesophageal reflux disease)   . S/P radioactive iodine thyroid ablation     2007  . Lower urinary tract symptoms (LUTS)   . Wears glasses   . Fibromyalgia   . Chronic interstitial cystitis with hematuria     2016    Past Surgical History  Procedure Laterality Date  . Cesarean section  01/02/2012    Procedure: CESAREAN SECTION;  Surgeon: Lenoard Adenichard J Taavon, MD;  Location: [facility];  Service: Gynecology;  Laterality: N/A;  . Knee arthroscopy Right 2008  . Colonoscopy  07-29-2013  . Bowel resection  newborn    necrotizing enterocolitis (liver patched)  . Bronchoscopy  2014  . Cystoscopy with biopsy N/A 11/23/2014    Procedure: CYSTOSCOPY WITH BLADDER  BIOPSY AND FULGERATION;  Surgeon: Bjorn PippinJohn Wrenn, MD;  [facility] [place];  Service: Urology;  Laterality: N/A;    Current Outpatient Prescriptions  Medication Sig Dispense Refill  . cholestyramine (QUESTRAN) 4 G packet Take 0.5 packets (2 g total)  by mouth daily. (Patient taking differently: Take 4 g by mouth daily as needed (loose stool). ) 30 each 12  . hydroxychloroquine (PLAQUENIL) 200 MG tablet Take 400 mg by mouth every morning.     . hyoscyamine (LEVBID) 0.375 MG 12 hr tablet Take 1 tablet (0.375 mg total) by mouth 2 (two) times daily. (Patient taking differently: Take 0.375 mg by mouth every 12 (twelve) hours as needed. ) 60 tablet 6  . iron polysaccharides (NIFEREX) 150 MG capsule Take 150 mg by mouth 2 (two) times daily.    Marland Kitchen levothyroxine (SYNTHROID, LEVOTHROID) 112 MCG tablet Take 224 mcg by mouth daily before breakfast.     . meloxicam (MOBIC) 15 MG tablet Take 15 mg by mouth daily.    Marland Kitchen OMEPRAZOLE PO Take 20 mg by mouth at bedtime.     .  pilocarpine (SALAGEN) 5 MG tablet Take 5 mg by mouth at bedtime.    Marland Kitchen tiZANidine (ZANAFLEX) 4 MG tablet Take 4 mg by mouth every 8 (eight) hours as needed for muscle spasms.     . nitrofurantoin, macrocrystal-monohydrate, (MACROBID) 100 MG capsule Take 1 capsule (100 mg total) by mouth 2 (two) times daily. Take one capsule BID x 10 days 20 capsule 0   No current facility-administered medications for this visit.     ALLERGIES: Penicillins and Morphine and related  Family History  Problem Relation Age of Onset  . Diabetes Father   . Other Neg Hx   . Colon cancer Neg Hx   . Pancreatic cancer Neg Hx   . Stomach cancer Neg Hx   . Endometriosis Mother   . Heart disease Maternal Grandmother   . Hypertension Maternal Grandmother   . Osteoporosis Maternal Grandmother   . Heart disease Maternal Grandfather   . Hypertension Maternal Grandfather   . Heart disease Paternal Grandmother     History   Social History  . Marital Status: Married    Spouse Name: N/A  . Number of Children: 1  . Years of Education: N/A   Occupational History  . Student    Social History Main Topics  . Smoking status: Never Smoker   . Smokeless tobacco: Never Used  . Alcohol Use: No  . Drug Use: No  . Sexual Activity: Not on file   Other Topics Concern  . Not on file   Social History Narrative    ROS:  Pertinent items are noted in HPI.  PHYSICAL EXAMINATION:    BP 100/78 mmHg  Pulse 104  Resp 16  Wt 99 lb (44.906 kg)  LMP 01/17/2015    General appearance: alert, cooperative and appears stated age Abdomen: soft, non-tender; bowel sounds normal; no masses,  no organomegaly CVA Not tender  Pelvic: External genitalia:  no lesions. She is tender with palpation of the vestibule with a cotton swab              Urethra:  normal appearing urethra with no masses, tenderness or lesions              Bartholins and Skenes: normal                 Vagina: normal appearing vagina with normal color and  discharge, no lesions. Blood in her vagina (on menses)              Cervix: no lesions              Bimanual Exam:  Uterus:  normal size, contour, position,  consistency, mobility, non-tender. Bladder is tender to palpation. The left pelvic floor is tender to palpation. Right pelvic floor not tender.              Adnexa: No masses, slightly tender over left adnexa.  Chaperone was present for exam.  Wet prep: +RBC, +WBC, no trich, ?clue, not clear KOH: 1? 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  Over 30 minutes face to face time of which over 50% was spent in counseling.

## 2015-01-17 NOTE — Telephone Encounter (Signed)
Medication refill request: Flagyl 500 Last AEX:  07/22/13 with TL Next AEX: none Last MMG (if hormonal medication request): n/a Refill authorized: Please advise.

## 2015-01-18 ENCOUNTER — Other Ambulatory Visit: Payer: Self-pay | Admitting: Obstetrics and Gynecology

## 2015-01-18 DIAGNOSIS — N94819 Vulvodynia, unspecified: Secondary | ICD-10-CM

## 2015-01-18 LAB — URINALYSIS, MICROSCOPIC ONLY
Bacteria, UA: NONE SEEN
CRYSTALS: NONE SEEN
Casts: NONE SEEN
RBC / HPF: >50 RBC/hpf — AB (ref <3)

## 2015-01-18 LAB — WET PREP BY MOLECULAR PROBE
CANDIDA SPECIES: NEGATIVE
GARDNERELLA VAGINALIS: NEGATIVE
Trichomonas vaginosis: NEGATIVE

## 2015-01-18 MED ORDER — NONFORMULARY OR COMPOUNDED ITEM: Status: DC

## 2015-01-19 LAB — URINE CULTURE: Colony Count: 2000

## 2015-01-24 ENCOUNTER — Encounter: Payer: Self-pay | Admitting: Obstetrics and Gynecology

## 2015-01-25 ENCOUNTER — Encounter: Payer: Self-pay | Admitting: Obstetrics and Gynecology

## 2015-01-25 NOTE — Telephone Encounter (Signed)
Patient has been contacted today by  Lilyan Gilford and appointment information given. Referral note sent to Dr. Oscar La to advise.  Will close mychart encounter.

## 2015-02-04 ENCOUNTER — Other Ambulatory Visit: Payer: Self-pay | Admitting: Gastroenterology

## 2015-02-23 ENCOUNTER — Ambulatory Visit: Payer: 59 | Admitting: Obstetrics and Gynecology

## 2015-02-24 ENCOUNTER — Telehealth: Payer: Self-pay | Admitting: Obstetrics and Gynecology

## 2015-02-24 NOTE — Telephone Encounter (Signed)
Patient did not keep her appointment for a 1 month follow up with Dr. Oscar La on 02/23/15. I called the patient and left a message for her to call back to reschedule.

## 2015-03-01 ENCOUNTER — Encounter: Payer: Self-pay | Admitting: Obstetrics and Gynecology

## 2015-04-06 ENCOUNTER — Encounter: Payer: Self-pay | Admitting: Obstetrics and Gynecology

## 2015-05-31 DIAGNOSIS — Z0289 Encounter for other administrative examinations: Secondary | ICD-10-CM

## 2015-08-02 DIAGNOSIS — L80 Vitiligo: Secondary | ICD-10-CM | POA: Insufficient documentation

## 2015-08-02 DIAGNOSIS — M2550 Pain in unspecified joint: Secondary | ICD-10-CM | POA: Insufficient documentation

## 2015-08-02 DIAGNOSIS — H9193 Unspecified hearing loss, bilateral: Secondary | ICD-10-CM | POA: Insufficient documentation

## 2015-08-02 DIAGNOSIS — E890 Postprocedural hypothyroidism: Secondary | ICD-10-CM | POA: Insufficient documentation

## 2015-08-02 DIAGNOSIS — L709 Acne, unspecified: Secondary | ICD-10-CM | POA: Insufficient documentation

## 2015-08-02 DIAGNOSIS — K219 Gastro-esophageal reflux disease without esophagitis: Secondary | ICD-10-CM | POA: Insufficient documentation

## 2015-08-02 DIAGNOSIS — J3489 Other specified disorders of nose and nasal sinuses: Secondary | ICD-10-CM | POA: Insufficient documentation

## 2015-08-02 DIAGNOSIS — F419 Anxiety disorder, unspecified: Secondary | ICD-10-CM | POA: Insufficient documentation

## 2015-09-14 DIAGNOSIS — K439 Ventral hernia without obstruction or gangrene: Secondary | ICD-10-CM | POA: Insufficient documentation

## 2015-09-24 DIAGNOSIS — M797 Fibromyalgia: Secondary | ICD-10-CM | POA: Insufficient documentation

## 2015-12-21 ENCOUNTER — Encounter: Payer: Self-pay | Admitting: Hematology and Oncology

## 2015-12-27 MED FILL — BUPIVACAINE 0.5% VIAL: 0.5 | 30 days supply | Qty: 450 | Fill #0

## 2015-12-27 MED FILL — HEPARIN SOD 10,000 UNIT/ML: 10000 | 30 days supply | Qty: 120 | Fill #0

## 2015-12-27 MED FILL — BD NEEDLES 18GX1.5: 18G X 1-1/2 | 25 days supply | Qty: 50 | Fill #0

## 2015-12-27 MED FILL — MONOJECT DISP SYRINGE 20 ML: 20 ML | 30 days supply | Qty: 30 | Fill #0

## 2016-01-05 ENCOUNTER — Ambulatory Visit (HOSPITAL_BASED_OUTPATIENT_CLINIC_OR_DEPARTMENT_OTHER): Payer: Medicaid Other | Admitting: Hematology and Oncology

## 2016-01-05 ENCOUNTER — Ambulatory Visit (HOSPITAL_BASED_OUTPATIENT_CLINIC_OR_DEPARTMENT_OTHER): Payer: Medicaid Other

## 2016-01-05 ENCOUNTER — Encounter: Payer: Self-pay | Admitting: Hematology and Oncology

## 2016-01-05 ENCOUNTER — Telehealth: Payer: Self-pay | Admitting: Hematology and Oncology

## 2016-01-05 VITALS — BP 118/91 | HR 79 | Temp 97.3°F | Resp 18 | Ht 60.0 in | Wt 97.3 lb

## 2016-01-05 DIAGNOSIS — D500 Iron deficiency anemia secondary to blood loss (chronic): Secondary | ICD-10-CM

## 2016-01-05 DIAGNOSIS — N3011 Interstitial cystitis (chronic) with hematuria: Secondary | ICD-10-CM

## 2016-01-05 DIAGNOSIS — N920 Excessive and frequent menstruation with regular cycle: Secondary | ICD-10-CM

## 2016-01-05 LAB — IRON AND TIBC
%SAT: 6 % — ABNORMAL LOW (ref 21–57)
IRON: 25 ug/dL — AB (ref 41–142)
TIBC: 436 ug/dL (ref 236–444)
UIBC: 412 ug/dL — AB (ref 120–384)

## 2016-01-05 LAB — CBC WITH DIFFERENTIAL/PLATELET
BASO%: 0 % (ref 0.0–2.0)
Basophils Absolute: 0 10*3/uL (ref 0.0–0.1)
EOS%: 0.7 % (ref 0.0–7.0)
Eosinophils Absolute: 0 10*3/uL (ref 0.0–0.5)
HCT: 32.9 % — ABNORMAL LOW (ref 34.8–46.6)
HGB: 10.7 g/dL — ABNORMAL LOW (ref 11.6–15.9)
LYMPH%: 39.8 % (ref 14.0–49.7)
MCH: 26.1 pg (ref 25.1–34.0)
MCHC: 32.5 g/dL (ref 31.5–36.0)
MCV: 80.2 fL (ref 79.5–101.0)
MONO#: 0.2 10*3/uL (ref 0.1–0.9)
MONO%: 4.7 % (ref 0.0–14.0)
NEUT#: 2.4 10*3/uL (ref 1.5–6.5)
NEUT%: 54.8 % (ref 38.4–76.8)
Platelets: 167 10*3/uL (ref 145–400)
RBC: 4.1 10*6/uL (ref 3.70–5.45)
RDW: 17.8 % — ABNORMAL HIGH (ref 11.2–14.5)
WBC: 4.5 10*3/uL (ref 3.9–10.3)
lymph#: 1.8 10*3/uL (ref 0.9–3.3)

## 2016-01-05 LAB — FERRITIN: Ferritin: 6 ng/ml — ABNORMAL LOW (ref 9–269)

## 2016-01-05 NOTE — Telephone Encounter (Signed)
appt made and avs printed °

## 2016-01-05 NOTE — Assessment & Plan Note (Signed)
Patient has had heavy menstrual bleeding accompanied by urinary tract bleeding from chronic interstitial cystitis. She has been on oral iron therapy since 2012 although at times she was taken off iron supplementation. Since February she has been anemic with a hemoglobin of 10.6-10.9. In spite of being on oral iron treatment her hemoglobin has remained low. She continues to have symptoms related to iron deficiency with fatigue, ice craving, headaches etc.  Recommendation: 1. IV iron treatment 2. Labs including iron studies to be done today  3. Return to clinic in 3 months with recheck of iron studies and ferritin.

## 2016-01-05 NOTE — Progress Notes (Signed)
[facility] CONSULT NOTE  Patient Care Team: Delorse LekBrent A Burnett, MD as PCP - General (Family Medicine)  CHIEF COMPLAINTS/PURPOSE OF CONSULTATION:  Recurrent iron deficiency anemia due to chronic blood loss  HISTORY OF PRESENTING ILLNESS:  [name] 25 y.o. female is here because of recurrent chronic iron deficiency anemia. Patient has known deficiency of iron at least since 2012 according to the patient. She was given prenatal center vitamins and iron supplementation which she has been taking ever since 2012. She had her baby and since then she has had heavy menstrual cycles accompanied also by urinary bleeding symptoms. She is under the care of gynecology. She has a hemoglobin of 10.6-10.9 since February 2017. It appears that in spite of her on replacement her symptoms of 5 deficiency been persistent which include ice cravings, fatigue and headaches. Because of this she was referred to see us to consider IV iron treatment.  I reviewed her records extensively and collaborated the history with the patient.  MEDICAL HISTORY:  Past Medical History  Diagnosis Date  . Hypothyroidism   . Sjogren's disease   . History of Graves' disease     tx done in 2007  . Malabsorption syndrome   . Lupus     rheumologist--  dr Janalyn Rouseshaili devashwer  . Vitiligo   . Bladder mass   . Passage of loose stools     chronic --  due to short gut syndrome  . SGS (short gut syndrome)   . Gross hematuria   . History of recurrent UTIs   . History of migraine   . Iron deficiency anemia     due to SGS  . GERD (gastroesophageal reflux disease)   . S/P radioactive iodine thyroid ablation     2007  . Lower urinary tract symptoms (LUTS)   . Wears glasses   . Fibromyalgia   . Chronic interstitial cystitis with hematuria     2016    SURGICAL HISTORY: Past Surgical History  Procedure Laterality Date  . Cesarean section  01/02/2012    Procedure: CESAREAN SECTION;  Surgeon: Lenoard Adenichard J Taavon, MD;   Location: [facility];  Service: Gynecology;  Laterality: N/A;  . Knee arthroscopy Right 2008  . Colonoscopy  07-29-2013  . Bowel resection  newborn    necrotizing enterocolitis (liver patched)  . Bronchoscopy  2014  . Cystoscopy with biopsy N/A 11/23/2014    Procedure: CYSTOSCOPY WITH BLADDER  BIOPSY AND FULGERATION;  Surgeon: Bjorn PippinJohn Wrenn, MD;  [facility];  Service: Urology;  Laterality: N/A;    SOCIAL HISTORY: Social History   Social History  . Marital Status: Married    Spouse Name: N/A  . Number of Children: 1  . Years of Education: N/A   Occupational History  . Student    Social History Main Topics  . Smoking status: Never Smoker   . Smokeless tobacco: Never Used  . Alcohol Use: No  . Drug Use: No  . Sexual Activity: Not on file   Other Topics Concern  . Not on file   Social History Narrative    FAMILY HISTORY: Family History  Problem Relation Age of Onset  . Diabetes Father   . Other Neg Hx   . Colon cancer Neg Hx   . Pancreatic cancer Neg Hx   . Stomach cancer Neg Hx   . Endometriosis Mother   . Heart disease Maternal Grandmother   . Hypertension Maternal Grandmother   . Osteoporosis Maternal Grandmother   .  Heart disease Maternal Grandfather   . Hypertension Maternal Grandfather   . Heart disease Paternal Grandmother     ALLERGIES:  is allergic to penicillins and morphine and related.  MEDICATIONS:  Current Outpatient Prescriptions  Medication Sig Dispense Refill  . cholestyramine (QUESTRAN) 4 G packet Take 0.5 packets (2 g total) by mouth daily. (Patient taking differently: Take 4 g by mouth daily as needed (loose stool). ) 30 each 12  . hydroxychloroquine (PLAQUENIL) 200 MG tablet Take 400 mg by mouth every morning.     . hyoscyamine (LEVBID) 0.375 MG 12 hr tablet TAKE 1 TABLET BY MOUTH TWICE DAILY 180 tablet 0  . iron polysaccharides (NIFEREX) 150 MG capsule Take 150 mg by mouth 2 (two) times daily.    Marland Kitchen levothyroxine  (SYNTHROID, LEVOTHROID) 112 MCG tablet Take 224 mcg by mouth daily before breakfast.     . meloxicam (MOBIC) 15 MG tablet Take 15 mg by mouth daily.    . nitrofurantoin, macrocrystal-monohydrate, (MACROBID) 100 MG capsule Take 1 capsule (100 mg total) by mouth 2 (two) times daily. Take one capsule BID x 10 days 20 capsule 0  . NONFORMULARY OR COMPOUNDED ITEM Gabapentin 6% cream, apply a pea sized amount to the vulva qhs and up to TID prn. #30 grams, no refills 1 each 0  . NONFORMULARY OR COMPOUNDED ITEM Lidocaine 5% cream in a neutral base. Apply a pea sized amount topically up to 6 x a day. 30 grams, no refills. 1 each 0  . OMEPRAZOLE PO Take 20 mg by mouth at bedtime.     . pilocarpine (SALAGEN) 5 MG tablet Take 5 mg by mouth at bedtime.    Marland Kitchen tiZANidine (ZANAFLEX) 4 MG tablet Take 4 mg by mouth every 8 (eight) hours as needed for muscle spasms.      No current facility-administered medications for this visit.    REVIEW OF SYSTEMS:   Constitutional: Denies fevers, chills or abnormal night sweats Eyes: Denies blurriness of vision, double vision or watery eyes Ears, nose, mouth, throat, and face: Denies mucositis or sore throat Respiratory: Denies cough, dyspnea or wheezes Cardiovascular: Denies palpitation, chest discomfort or lower extremity swelling Gastrointestinal:  Denies nausea, heartburn or change in bowel habits Skin: Denies abnormal skin rashes Lymphatics: Denies new lymphadenopathy or easy bruising Neurological:Denies numbness, tingling or new weaknesses Behavioral/Psych: Mood is stable, no new changes  All other systems were reviewed with the patient and are negative.  PHYSICAL EXAMINATION: ECOG PERFORMANCE STATUS: 1 - Symptomatic but completely ambulatory  Filed Vitals:   01/05/16 1303  BP: 118/91  Pulse: 79  Temp: 97.3 F (36.3 C)  Resp: 18   Filed Weights   01/05/16 1303  Weight: 97 lb 4.8 oz (44.135 kg)    GENERAL:alert, no distress and comfortable SKIN: skin  color, texture, turgor are normal, no rashes or significant lesions EYES: normal, conjunctiva are pink and non-injected, sclera clear OROPHARYNX:no exudate, no erythema and lips, buccal mucosa, and tongue normal  NECK: supple, thyroid normal size, non-tender, without nodularity LYMPH:  no palpable lymphadenopathy in the cervical, axillary or inguinal LUNGS: clear to auscultation and percussion with normal breathing effort HEART: regular rate & rhythm and no murmurs and no lower extremity edema ABDOMEN:abdomen soft, non-tender and normal bowel sounds Musculoskeletal:no cyanosis of digits and no clubbing  PSYCH: alert & oriented x 3 with fluent speech NEURO: no focal motor/sensory deficits   LABORATORY DATA:  I have reviewed the data as listed Lab Results  Component Value Date  WBC 6.1 09/29/2014   HGB 12.0 11/23/2014   HCT 40.8 09/29/2014   MCV 88.3 09/29/2014   PLT 190 09/29/2014   Lab Results  Component Value Date   NA 137 09/29/2014   K 3.4* 09/29/2014   CL 107 09/29/2014   CO2 22 09/29/2014    RADIOGRAPHIC STUDIES: I have personally reviewed the radiological reports and agreed with the findings in the report.  ASSESSMENT AND PLAN:  Iron deficiency anemia due to chronic blood loss Patient has had heavy menstrual bleeding accompanied by urinary tract bleeding from chronic interstitial cystitis. She has been on oral iron therapy since 2012 although at times she was taken off iron supplementation. Since February she has been anemic with a hemoglobin of 10.6-10.9. In spite of being on oral iron treatment her hemoglobin has remained low. She continues to have symptoms related to iron deficiency with fatigue, ice craving, headaches etc.  Recommendation: 1. IV iron treatment 2. Labs including iron studies to be done today  3. Return to clinic in 3 months with recheck of iron studies and ferritin.      All questions were answered. The patient knows to call the clinic with any  problems, questions or concerns.    Sabas SousGudena, Brieanne Mignone K, MD 01/05/2016

## 2016-01-13 ENCOUNTER — Encounter: Payer: Self-pay | Admitting: Nurse Practitioner

## 2016-01-13 ENCOUNTER — Ambulatory Visit (HOSPITAL_BASED_OUTPATIENT_CLINIC_OR_DEPARTMENT_OTHER): Payer: Medicaid Other | Admitting: Nurse Practitioner

## 2016-01-13 ENCOUNTER — Ambulatory Visit (HOSPITAL_BASED_OUTPATIENT_CLINIC_OR_DEPARTMENT_OTHER): Payer: Medicaid Other

## 2016-01-13 VITALS — BP 112/85 | HR 59 | Temp 98.7°F | Resp 17

## 2016-01-13 DIAGNOSIS — T7840XA Allergy, unspecified, initial encounter: Secondary | ICD-10-CM | POA: Diagnosis not present

## 2016-01-13 DIAGNOSIS — D500 Iron deficiency anemia secondary to blood loss (chronic): Secondary | ICD-10-CM | POA: Diagnosis not present

## 2016-01-13 MED ORDER — METHYLPREDNISOLONE SODIUM SUCC 125 MG IJ SOLR
60.0000 mg | Freq: Once | INTRAMUSCULAR | Status: AC
Start: 2016-01-13 — End: 2016-01-13
  Administered 2016-01-13: 60 mg via INTRAVENOUS

## 2016-01-13 MED ORDER — DIPHENHYDRAMINE HCL 50 MG/ML IJ SOLN
25.0000 mg | Freq: Once | INTRAMUSCULAR | Status: AC
  Administered 2016-01-13: 25 mg via INTRAVENOUS

## 2016-01-13 MED ORDER — SODIUM CHLORIDE 0.9 % IV SOLN
510.0000 mg | Freq: Once | INTRAVENOUS | Status: AC
  Administered 2016-01-13: 510 mg via INTRAVENOUS
  Filled 2016-01-13: qty 17

## 2016-01-13 MED ORDER — FAMOTIDINE IN NACL 20-0.9 MG/50ML-% IV SOLN
20.0000 mg | Freq: Once | INTRAVENOUS | Status: AC
  Administered 2016-01-13: 20 mg via INTRAVENOUS

## 2016-01-13 MED ORDER — SODIUM CHLORIDE 0.9 % IV SOLN
Freq: Once | INTRAVENOUS | Status: AC
  Administered 2016-01-13: 09:00:00 via INTRAVENOUS

## 2016-01-13 NOTE — Progress Notes (Signed)
Pt c/o of itching all over at approx. [id number].  Iron infusion stopped.  Everrett Coombeyndee Bacon, NP notified.  Pt given Benadryl 25mg  IV, Pepcid 20mg  IV and Solumedrol 60mg  IV.  Itching resolved, infusion resumed and completed with no further complications.  Pt observed for > 30 minutes post transfusion.  Alert and stable for discharge.

## 2016-01-13 NOTE — Assessment & Plan Note (Signed)
Patient has a history of iron deficiency anemia secondary to chronic blood loss.  Her last labs that were obtained on 01/05/2016 revealed chronic iron deficiency anemia.  Patient presented to the cancer Center today to receive her first Feraheme infusion.  She experienced a mild hypersensitivity reaction which consisted of a generalized pruritus only.  She had no rash or hives.  She also had no other allergic-type symptoms whatsoever.  Iron infusion was held; and patient received Benadryl and Pepcid; as well as Solu-Medrol 60 mg IV.  Patient will proceed with her Feraheme infusion; and will be monitored very closely.  Patient will also be given full instructions in the use of both Benadryl and Pepcid at home for any mild allergic-type reactions.  She will also be advised to go directly to the emergency department for any worsening allergic gastric symptoms whatsoever.

## 2016-01-13 NOTE — Assessment & Plan Note (Signed)
Patient has a history of iron deficiency anemia secondary to chronic blood loss.  Her last labs that were obtained on 01/05/2016 revealed chronic iron deficiency anemia.  Patient presented to the cancer Center today to receive her first Feraheme infusion.  She experienced a mild hypersensitivity reaction which was managed per protocol.  See further notes for details.  Patient is scheduled to return for her second Feraheme infusion on 01/20/2016.  She is scheduled for labs and a follow-up visit on 04/05/2016.

## 2016-01-13 NOTE — Progress Notes (Signed)
SYMPTOM MANAGEMENT CLINIC    Chief Complaint: Hypersensitivity reaction  HPI:  [name] 25 y.o. female diagnosed with iron deficiency anemia.  Presents to the cancer Center today to receive her first Feraheme infusion.  Patient has a history of iron deficiency anemia secondary to chronic blood loss.  Her last labs that were obtained on 01/05/2016 revealed chronic iron deficiency anemia.  Patient presented to the cancer Center today to receive her first Feraheme infusion.  She experienced a mild hypersensitivity reaction which consisted of a generalized pruritus only.  She had no rash or hives.  She also had no other allergic-type symptoms whatsoever.  Iron infusion was held; and patient received Benadryl and Pepcid; as well as Solu-Medrol 60 mg IV.  Patient will proceed with her Feraheme infusion; and will be monitored very closely.  Patient will also be given full instructions in the use of both Benadryl and Pepcid at home for any mild allergic-type reactions.  She will also be advised to go directly to the emergency department for any worsening allergic gastric symptoms whatsoever.    No history exists.    Review of Systems  Skin: Positive for itching. Negative for rash.  All other systems reviewed and are negative.   Past Medical History  Diagnosis Date  . Hypothyroidism   . Sjogren's disease (HCC)   . History of Graves' disease     tx done in 2007  . Malabsorption syndrome   . Lupus [facility])     rheumologist--  dr Janalyn Rouse devashwer  . Vitiligo   . Bladder mass   . Passage of loose stools     chronic --  due to short gut syndrome  . SGS (short gut syndrome)   . Gross hematuria   . History of recurrent UTIs   . History of migraine   . Iron deficiency anemia     due to SGS  . GERD (gastroesophageal reflux disease)   . S/P radioactive iodine thyroid ablation     2007  . Lower urinary tract symptoms (LUTS)   . Wears glasses   . Fibromyalgia   . Chronic  interstitial cystitis with hematuria     2016    Past Surgical History  Procedure Laterality Date  . Cesarean section  01/02/2012    Procedure: CESAREAN SECTION;  Surgeon: Lenoard Aden, MD;  Location: [facility];  Service: Gynecology;  Laterality: N/A;  . Knee arthroscopy Right 2008  . Colonoscopy  07-29-2013  . Bowel resection  newborn    necrotizing enterocolitis (liver patched)  . Bronchoscopy  2014  . Cystoscopy with biopsy N/A 11/23/2014    Procedure: CYSTOSCOPY WITH BLADDER  BIOPSY AND FULGERATION;  Surgeon: Bjorn Pippin, MD;  Location: [facility];  Service: Urology;  Laterality: N/A;    has Hypothyroidism complicating pregnancy / delivered; H/O Graves' disease; Sjogren's disease (HCC); Malabsorption syndrome; Migraine with aura; Pelvic adhesive disease; Chronic interstitial cystitis with hematuria; Iron deficiency anemia due to chronic blood loss; and Hypersensitivity reaction on her problem list.    is allergic to penicillins and morphine and related.    Medication List       This list is accurate as of: 01/13/16  9:51 AM.  Always use your most recent med list.               cholestyramine 4 g packet  Commonly known as:  QUESTRAN  Take 0.5 packets (2 g total) by mouth daily.     hydroxychloroquine  200 MG tablet  Commonly known as:  PLAQUENIL  Take 400 mg by mouth every morning.     hyoscyamine 0.375 MG 12 hr tablet  Commonly known as:  LEVBID  TAKE 1 TABLET BY MOUTH TWICE DAILY     iron polysaccharides 150 MG capsule  Commonly known as:  NIFEREX  Take 150 mg by mouth 2 (two) times daily.     levothyroxine 112 MCG tablet  Commonly known as:  SYNTHROID, LEVOTHROID  Take 224 mcg by mouth daily before breakfast.     meloxicam 15 MG tablet  Commonly known as:  MOBIC  Take 15 mg by mouth daily.     nitrofurantoin (macrocrystal-monohydrate) 100 MG capsule  Commonly known as:  MACROBID  Take 1 capsule (100 mg total) by mouth 2 (two) times daily. Take  one capsule BID x 10 days     NONFORMULARY OR COMPOUNDED ITEM  Gabapentin 6% cream, apply a pea sized amount to the vulva qhs and up to TID prn. #30 grams, no refills     NONFORMULARY OR COMPOUNDED ITEM  Lidocaine 5% cream in a neutral base. Apply a pea sized amount topically up to 6 x a day. 30 grams, no refills.     OMEPRAZOLE PO  Take 20 mg by mouth at bedtime.     pilocarpine 5 MG tablet  Commonly known as:  SALAGEN  Take 5 mg by mouth at bedtime.     tiZANidine 4 MG tablet  Commonly known as:  ZANAFLEX  Take 4 mg by mouth every 8 (eight) hours as needed for muscle spasms.         PHYSICAL EXAMINATION  Oncology Vitals 01/13/2016 01/05/2016  Height - 152 cm  Weight - 44.135 kg  Weight (lbs) - 97 lbs 5 oz  BMI (kg/m2) - 19 kg/m2  Temp 98.4 97.3  Pulse 86 79  Resp 17 18  SpO2 99 99  BSA (m2) - 1.37 m2   BP Readings from Last 2 Encounters:  01/13/16 111/86  01/05/16 118/91    Physical Exam  Constitutional: She is oriented to person, place, and time and well-developed, well-nourished, and in no distress.  HENT:  Head: Normocephalic and atraumatic.  Eyes: Conjunctivae and EOM are normal. Pupils are equal, round, and reactive to light. Right eye exhibits no discharge. Left eye exhibits no discharge. No scleral icterus.  Neck: Normal range of motion.  Pulmonary/Chest: Effort normal. No stridor. No respiratory distress.  Musculoskeletal: Normal range of motion.  Neurological: She is alert and oriented to person, place, and time.  Skin: Skin is warm and dry. No rash noted. No erythema. No pallor.  There was no rash or hives noted on exam.  Psychiatric: Affect normal.  Nursing note and vitals reviewed.   LABORATORY DATA:. No visits with results within 3 Day(s) from this visit. Latest known visit with results is:  Appointment on 01/05/2016  Component Date Value Ref Range Status  . WBC 01/05/2016 4.5  3.9 - 10.3 10e3/uL Final  . NEUT# 01/05/2016 2.4  1.5 - 6.5  10e3/uL Final  . HGB 01/05/2016 10.7* 11.6 - 15.9 g/dL Final  . HCT 16/04/9603 32.9* 34.8 - 46.6 % Final  . Platelets 01/05/2016 167  145 - 400 10e3/uL Final  . MCV 01/05/2016 80.2  79.5 - 101.0 fL Final  . MCH 01/05/2016 26.1  25.1 - 34.0 pg Final  . MCHC 01/05/2016 32.5  31.5 - 36.0 g/dL Final  . RBC 54/03/8118 4.10  3.70 - 5.45  10e6/uL Final  . RDW 01/05/2016 17.8* 11.2 - 14.5 % Final  . lymph# 01/05/2016 1.8  0.9 - 3.3 10e3/uL Final  . MONO# 01/05/2016 0.2  0.1 - 0.9 10e3/uL Final  . Eosinophils Absolute 01/05/2016 0.0  0.0 - 0.5 10e3/uL Final  . Basophils Absolute 01/05/2016 0.0  0.0 - 0.1 10e3/uL Final  . NEUT% 01/05/2016 54.8  38.4 - 76.8 % Final  . LYMPH% 01/05/2016 39.8  14.0 - 49.7 % Final  . MONO% 01/05/2016 4.7  0.0 - 14.0 % Final  . EOS% 01/05/2016 0.7  0.0 - 7.0 % Final  . BASO% 01/05/2016 0.0  0.0 - 2.0 % Final  . Iron 01/05/2016 25* 41 - 142 ug/dL Final  . TIBC 16/10/960407/12/2015 436  236 - 444 ug/dL Final  . UIBC 54/09/811907/12/2015 412* 120 - 384 ug/dL Final  . %SAT 14/78/295607/12/2015 6* 21 - 57 % Final  . Ferritin 01/05/2016 6* 9 - 269 ng/ml Final    RADIOGRAPHIC STUDIES: No results found.  ASSESSMENT/PLAN:    Iron deficiency anemia due to chronic blood loss Patient has a history of iron deficiency anemia secondary to chronic blood loss.  Her last labs that were obtained on 01/05/2016 revealed chronic iron deficiency anemia.  Patient presented to the cancer Center today to receive her first Feraheme infusion.  She experienced a mild hypersensitivity reaction which was managed per protocol.  See further notes for details.  Patient is scheduled to return for her second Feraheme infusion on 01/20/2016.  She is scheduled for labs and a follow-up visit on 04/05/2016.  Hypersensitivity reaction Patient has a history of iron deficiency anemia secondary to chronic blood loss.  Her last labs that were obtained on 01/05/2016 revealed chronic iron deficiency anemia.  Patient presented to the  cancer Center today to receive her first Feraheme infusion.  She experienced a mild hypersensitivity reaction which consisted of a generalized pruritus only.  She had no rash or hives.  She also had no other allergic-type symptoms whatsoever.  Iron infusion was held; and patient received Benadryl and Pepcid; as well as Solu-Medrol 60 mg IV.  Patient will proceed with her Feraheme infusion; and will be monitored very closely.  Patient will also be given full instructions in the use of both Benadryl and Pepcid at home for any mild allergic-type reactions.  She will also be advised to go directly to the emergency department for any worsening allergic gastric symptoms whatsoever.     Patient stated understanding of all instructions; and was in agreement with this plan of care. The patient knows to call the clinic with any problems, questions or concerns.   Total time spent with patient was 15 minutes;  with greater than 75 percent of that time spent in face to face counseling regarding patient's symptoms,  and coordination of care and follow up.  Disclaimer:This dictation was prepared with Dragon/digital dictation along with [employer] technology. Any transcriptional errors that result from this process are unintentional.  Payton MccallumBacon, Cynthia, NP 01/13/2016

## 2016-01-13 NOTE — Patient Instructions (Signed)

## 2016-01-16 ENCOUNTER — Telehealth: Payer: Self-pay | Admitting: Nurse Practitioner

## 2016-01-16 NOTE — Telephone Encounter (Signed)
Called and left message for pt regarding her first iron infusion last week.  Pt had experienced a mild reaction at that time; with all symptoms resolved with hypersensitivity protocol management.  Patient was advised to call/return if she develops any worsening symptoms or concerns whatsoever.  She is scheduled to return for her second Feraheme infusion on 01/20/2016.

## 2016-01-19 ENCOUNTER — Other Ambulatory Visit: Payer: Self-pay

## 2016-01-20 ENCOUNTER — Ambulatory Visit (HOSPITAL_BASED_OUTPATIENT_CLINIC_OR_DEPARTMENT_OTHER): Payer: Medicaid Other

## 2016-01-20 VITALS — BP 127/82 | HR 52 | Temp 98.4°F | Resp 18

## 2016-01-20 DIAGNOSIS — D500 Iron deficiency anemia secondary to blood loss (chronic): Secondary | ICD-10-CM

## 2016-01-20 DIAGNOSIS — T7840XA Allergy, unspecified, initial encounter: Secondary | ICD-10-CM

## 2016-01-20 MED ORDER — DIPHENHYDRAMINE HCL 50 MG/ML IJ SOLN
25.0000 mg | Freq: Once | INTRAMUSCULAR | Status: AC
  Administered 2016-01-20: 25 mg via INTRAVENOUS

## 2016-01-20 MED ORDER — SODIUM CHLORIDE 0.9 % IV SOLN
510.0000 mg | Freq: Once | INTRAVENOUS | Status: AC
  Administered 2016-01-20: 510 mg via INTRAVENOUS
  Filled 2016-01-20: qty 17

## 2016-01-20 MED ORDER — DIPHENHYDRAMINE HCL 50 MG/ML IJ SOLN
INTRAMUSCULAR | Status: AC
  Filled 2016-01-20: qty 1

## 2016-01-20 MED ORDER — SODIUM CHLORIDE 0.9 % IV SOLN
Freq: Once | INTRAVENOUS | Status: AC
  Administered 2016-01-20: 09:00:00 via INTRAVENOUS

## 2016-01-20 NOTE — Patient Instructions (Signed)

## 2016-02-16 MED FILL — MONOJECT DISP SYRINGE 20 ML: 20 ML | 30 days supply | Qty: 30 | Fill #1

## 2016-02-16 MED FILL — BD NEEDLES 18GX1.5: 18G X 1-1/2 | 25 days supply | Qty: 50 | Fill #1

## 2016-02-16 MED FILL — HEPARIN SOD 10,000 UNIT/ML: 10000 | 30 days supply | Qty: 120 | Fill #1

## 2016-02-16 MED FILL — BUPIVACAINE 0.5% VIAL: 0.5 | 30 days supply | Qty: 450 | Fill #1

## 2016-02-28 ENCOUNTER — Telehealth: Payer: Self-pay | Admitting: Gastroenterology

## 2016-02-28 NOTE — Telephone Encounter (Signed)
Left message to call.

## 2016-02-29 NOTE — Telephone Encounter (Signed)
Voicemail received that patient is calling back.

## 2016-02-29 NOTE — Telephone Encounter (Signed)
Abd pain/discomfort with BR rectal bleeding with blood clots occasionally with bowel movement for the past 3 weeks.  Stools are very loose about 3 times daily.  She has been taking cholestyramine 1/2 pack daily and levbid daily. History of IBS, no other symptoms.  She states she feels ok just concerned about the bleeding.  NO SOB, afebrile.  Appt scheduled with Amy for 03/07/16.  She will call back if bleeding increases or pain becomes severe.

## 2016-03-07 ENCOUNTER — Ambulatory Visit (INDEPENDENT_AMBULATORY_CARE_PROVIDER_SITE_OTHER): Payer: Medicaid Other | Admitting: Physician Assistant

## 2016-03-07 ENCOUNTER — Encounter: Payer: Self-pay | Admitting: Physician Assistant

## 2016-03-07 ENCOUNTER — Other Ambulatory Visit (INDEPENDENT_AMBULATORY_CARE_PROVIDER_SITE_OTHER): Payer: Medicaid Other

## 2016-03-07 VITALS — BP 110/60 | HR 70 | Ht 60.0 in | Wt 102.8 lb

## 2016-03-07 DIAGNOSIS — R1084 Generalized abdominal pain: Secondary | ICD-10-CM

## 2016-03-07 DIAGNOSIS — Z9049 Acquired absence of other specified parts of digestive tract: Secondary | ICD-10-CM

## 2016-03-07 DIAGNOSIS — K625 Hemorrhage of anus and rectum: Secondary | ICD-10-CM | POA: Diagnosis not present

## 2016-03-07 DIAGNOSIS — K912 Postsurgical malabsorption, not elsewhere classified: Secondary | ICD-10-CM

## 2016-03-07 DIAGNOSIS — R197 Diarrhea, unspecified: Secondary | ICD-10-CM | POA: Diagnosis not present

## 2016-03-07 LAB — CBC WITH DIFFERENTIAL/PLATELET
BASOS PCT: 0.1 % (ref 0.0–3.0)
Basophils Absolute: 0 10*3/uL (ref 0.0–0.1)
EOS PCT: 0.7 % (ref 0.0–5.0)
Eosinophils Absolute: 0 10*3/uL (ref 0.0–0.7)
HEMATOCRIT: 34.8 % — AB (ref 36.0–46.0)
Hemoglobin: 11.7 g/dL — ABNORMAL LOW (ref 12.0–15.0)
Lymphocytes Relative: 40.9 % (ref 12.0–46.0)
Lymphs Abs: 2.1 10*3/uL (ref 0.7–4.0)
MCHC: 33.7 g/dL (ref 30.0–36.0)
MCV: 87.1 fl (ref 78.0–100.0)
MONOS PCT: 5.4 % (ref 3.0–12.0)
Monocytes Absolute: 0.3 10*3/uL (ref 0.1–1.0)
Neutro Abs: 2.8 10*3/uL (ref 1.4–7.7)
Neutrophils Relative %: 52.9 % (ref 43.0–77.0)
Platelets: 177 10*3/uL (ref 150.0–400.0)
RBC: 3.99 Mil/uL (ref 3.87–5.11)
RDW: 19.5 % — AB (ref 11.5–15.5)
WBC: 5.2 10*3/uL (ref 4.0–10.5)

## 2016-03-07 LAB — BASIC METABOLIC PANEL
BUN: 11 mg/dL (ref 6–23)
CO2: 28 meq/L (ref 19–32)
Calcium: 9 mg/dL (ref 8.4–10.5)
Chloride: 107 mEq/L (ref 96–112)
Creatinine, Ser: 0.54 mg/dL (ref 0.40–1.20)
GFR: 173.47 mL/min (ref >60.00)
Glucose, Bld: 88 mg/dL (ref 70–99)
Potassium: 3.7 mEq/L (ref 3.5–5.1)
Sodium: 139 mEq/L (ref 135–145)

## 2016-03-07 LAB — SEDIMENTATION RATE: SED RATE: 6 mm/h (ref 0–20)

## 2016-03-07 LAB — HIGH SENSITIVITY CRP: CRP, High Sensitivity: <0.2 mg/L (ref 0.000–5.000)

## 2016-03-07 MED ORDER — HYOSCYAMINE SULFATE ER 0.375 MG PO TB12: ORAL_TABLET | ORAL | 6 refills | Status: DC

## 2016-03-07 MED ORDER — CHOLESTYRAMINE 4 G PO PACK: 4.0000 g | PACK | Freq: Two times a day (BID) | ORAL | 6 refills | Status: DC

## 2016-03-07 NOTE — Patient Instructions (Addendum)
Please go to the basement level to have your labs drawn and stool study. We sent a prescription for Levsin for cramping.  We also sent refills on the Questran.   You have been scheduled for a CT scan of the abdomen and pelvis at [place] ([address]---this is in the same building as Press photographer).   You are scheduled on 03-09-2016 at 2:00 PM. You should arrive at 1:45  prior to your appointment time for registration. Please follow the written instructions below on the day of your exam:  WARNING: IF YOU ARE ALLERGIC TO IODINE/X-RAY DYE, PLEASE NOTIFY RADIOLOGY IMMEDIATELY AT [phone]! YOU WILL BE GIVEN A 13 HOUR PREMEDICATION PREP.  1) Do not eat or drink anything after 10:00 am (4 hours prior to your test) 2) You have been given 2 bottles of oral contrast to drink. The solution may taste               better if refrigerated, but do NOT add ice or any other liquid to this solution. Shake             well before drinking.    Drink 1 bottle of contrast @ 12:00 noon (2 hours prior to your exam)  Drink 1 bottle of contrast @ 1:00 PM (1 hour prior to your exam)  You may take any medications as prescribed with a small amount of water except for the following: Metformin, Glucophage, Glucovance, Avandamet, Riomet, Fortamet, Actoplus Met, Janumet, Glumetza or Metaglip. The above medications must be held the day of the exam AND 48 hours after the exam.  The purpose of you drinking the oral contrast is to aid in the visualization of your intestinal tract. The contrast solution may cause some diarrhea. Before your exam is started, you will be given a small amount of fluid to drink. Depending on your individual set of symptoms, you may also receive an intravenous injection of x-ray contrast/dye. Plan on being at [facility] for 30 minutes or long, depending on the type of exam you are having performed.  If you have any questions regarding your exam or if you need to  reschedule, you may call the CT department at [phone] between the hours of 8:00 am and 5:00 pm, Monday-Friday.  We did do the referral for Nutritionist. ________________________________________________________________________

## 2016-03-07 NOTE — Progress Notes (Signed)
Subjective:    Patient ID: [name], female    DOB: Nov 16, 1990, 25 y.o.   MRN: [id number]  HPI [name]  is a pleasant 25 year old female known to Dr. Christella Bryan. 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 She has an anastomosis in her right colon. She has had some malabsorption issues and has a chronic iron deficiency anemia. Unfortunately she also has lupus, Sjogren syndrome history of Graves' disease and interstitial cystitis. She is currently on ferrous heme and is followed by hematology for her iron deficiency.  She had colonoscopy in 2015 with finding of a tortuous colon and an ileocolonic anastomosis on the right. She comes in today with complaints of any increase over her baseline mild diarrhea over the past with 3 weeks which is been associated with abdominal pain and cramping and rectal bleeding with intermittent passage of clots. She takes Questran chronically twice daily and had also been on Levsin once daily. She says she typically has 2-3 bowel movements per day and has been having at least 5 liquid bowel movements every day over the past 3 weeks. No fever or chills. Appetite has  been decreased. She did take a course of a sulfa antibiotic about a month ago because of a UTI. No other new medications.   Review of Systems Pertinent positive and negative review of systems were noted in the above HPI section.  All other review of systems was otherwise negative.  Outpatient Encounter Prescriptions as of 03/07/2016  Medication Sig  . AMBULATORY NON FORMULARY MEDICATION [facility]  . AMBULATORY NON FORMULARY MEDICATION Bladder installation daily:  Heparin 10,000 units and bupivazaine 0.5%  . cholestyramine (QUESTRAN) 4 g packet Take 1 packet (4 g total) by mouth 2 (two)  times daily.  . hydroxychloroquine (PLAQUENIL) 200 MG tablet Take 400 mg by mouth every morning.   . hyoscyamine (LEVBID) 0.375 MG 12 hr tablet Take 1 tab 3 times daily as needed for cramping and spasms.  Marland Kitchen. levothyroxine (SYNTHROID, LEVOTHROID) 137 MCG tablet Take 137 mcg by mouth daily before breakfast.  . meloxicam (MOBIC) 15 MG tablet Take 15 mg by mouth daily.  . nitrofurantoin, macrocrystal-monohydrate, (MACROBID) 100 MG capsule Take 1 capsule (100 mg total) by mouth 2 (two) times daily. Take one capsule BID x 10 days  . NONFORMULARY OR COMPOUNDED ITEM Gabapentin 6% cream, apply a pea sized amount to the vulva qhs and up to TID prn. #30 grams, no refills  . NONFORMULARY OR COMPOUNDED ITEM Lidocaine 5% cream in a neutral base. Apply a pea sized amount topically up to 6 x a day. 30 grams, no refills.  . OMEPRAZOLE PO Take 20 mg by mouth at bedtime.   . pilocarpine (SALAGEN) 5 MG tablet Take 5 mg by mouth at bedtime.  Marland Kitchen. QUEtiapine (SEROQUEL) 300 MG tablet Take 300 mg by mouth at bedtime.  Marland Kitchen. tiZANidine (ZANAFLEX) 4 MG tablet Take 4 mg by mouth every 8 (eight) hours as needed for muscle spasms.   . [DISCONTINUED] cholestyramine (QUESTRAN) 4 G packet Take 0.5 packets (2 g total) by mouth daily. (Patient taking differently: Take 4 g by mouth daily as needed (loose stool). )  . [DISCONTINUED] hyoscyamine (LEVBID) 0.375 MG 12 hr tablet TAKE 1 TABLET BY MOUTH TWICE DAILY  . [DISCONTINUED] iron  polysaccharides (NIFEREX) 150 MG capsule Take 150 mg by mouth 2 (two) times daily.  . [DISCONTINUED] levothyroxine (SYNTHROID, LEVOTHROID) 112 MCG tablet Take 224 mcg by mouth daily before breakfast.    No facility-administered encounter medications on file as of 03/07/2016.    Allergies  Allergen Reactions  . Penicillins Anaphylaxis  . Morphine And Related Itching   Patient Active Problem List   Diagnosis Date Noted  . Hypersensitivity reaction 01/13/2016  . Iron deficiency anemia due to chronic blood loss  01/05/2016  . Chronic interstitial cystitis with hematuria   . H/O Graves' disease 07/22/2013  . Sjogren's disease (HCC) 07/22/2013  . Malabsorption syndrome 07/22/2013  . Migraine with aura 07/22/2013  . Pelvic adhesive disease 07/22/2013  . Hypothyroidism complicating pregnancy / delivered 12/28/2011   Social History   Social History  . Marital status: Divorced    Spouse name: N/A  . Number of children: 1  . Years of education: N/A   Occupational History  . Student    Social History Main Topics  . Smoking status: Never Smoker  . Smokeless tobacco: Never Used  . Alcohol use No  . Drug use: No  . Sexual activity: Not on file   Other Topics Concern  . Not on file   Social History Narrative  . No narrative on file    [name]'s family history includes Diabetes in her father; Endometriosis in her mother; Heart disease in her maternal grandfather, maternal grandmother, and paternal grandmother; Hypertension in her maternal grandfather and maternal grandmother; Osteoporosis in her maternal grandmother.      Objective:    Vitals:   03/07/16 1107  BP: 110/60  Pulse: 70    Physical Exam  well-developed young female in no acute distress, pleasant blood pressure 110/60 pulse 70, BMI 20. HEENT ;nontraumatic normocephalic EOMI PERRLA sclera anicteric, Cardiovascular ;regular rate and rhythm with S1-S2 no murmur or gallop, Pulmonary; clear bilaterally, Abdomen; soft, midline incisional scars, she has rather generalized lower abdominal tenderness no guarding or rebound no palpable mass or hepatosplenomegaly, bowel sounds are present, Rectal; exam not done patient showed me a picture of a bowel movement from yesterday containing dark clots, Extremities; no clubbing cyanosis or edema skin warm and dry, Neuropsych ;mood and affect appropriate       Assessment & Plan:   #53 25 year old female with 3 week history of diarrhea abdominal cramping intermittent rectal bleeding with  passage of clots. Rule out infectious [name], rule out possible ischemic colitis , rule out IBD #2 history of chronic diarrhea felt secondary to short gut syndrome #3 Lupus Number for Sjogren's syndrome #5 interstitial cystitis #6 chronic iron deficiency anemia has recently started ferriheme injections  Plan; CBC with differential and BMET GI pathogen panel Schedule for CT scan of the abdomen and pelvis Increase hyoscyamine to 1 by mouth 3 times a day Continue  Questran 4 g 1 packet twice a day Soft diet Further plans pending results of stool studies and CT.  Jamie Hulick S Zamiya Dillard PA-C 03/07/2016   Cc: Delorse Lek, MD

## 2016-03-08 ENCOUNTER — Telehealth: Payer: Self-pay | Admitting: Physician Assistant

## 2016-03-08 NOTE — Progress Notes (Signed)
I agree with the above note, plan 

## 2016-03-08 NOTE — Telephone Encounter (Signed)
[name] the pt insurance will not cover levbid, what else would you like her to try?

## 2016-03-09 ENCOUNTER — Other Ambulatory Visit: Payer: Medicaid Other

## 2016-03-09 ENCOUNTER — Ambulatory Visit (INDEPENDENT_AMBULATORY_CARE_PROVIDER_SITE_OTHER)
Admission: RE | Admit: 2016-03-09 | Discharge: 2016-03-09 | Disposition: A | Discharge status: Home / Self Care | Payer: Medicaid Other | Source: Ambulatory Visit | Attending: Physician Assistant | Admitting: Physician Assistant

## 2016-03-09 DIAGNOSIS — K625 Hemorrhage of anus and rectum: Secondary | ICD-10-CM

## 2016-03-09 DIAGNOSIS — R197 Diarrhea, unspecified: Secondary | ICD-10-CM

## 2016-03-09 DIAGNOSIS — Z9049 Acquired absence of other specified parts of digestive tract: Secondary | ICD-10-CM

## 2016-03-09 DIAGNOSIS — R1084 Generalized abdominal pain: Secondary | ICD-10-CM

## 2016-03-09 MED ORDER — IOPAMIDOL (ISOVUE-300) INJECTION 61%
80.0000 mL | Freq: Once | INTRAVENOUS | Status: AC | PRN
  Administered 2016-03-09: 80 mL via INTRAVENOUS

## 2016-03-09 MED ORDER — DICYCLOMINE HCL 10 MG PO CAPS: 10.0000 mg | ORAL_CAPSULE | Freq: Three times a day (TID) | ORAL | 3 refills | Status: DC

## 2016-03-09 NOTE — Telephone Encounter (Signed)
Hyomax BID -TID,  robinul forte BID , or bentyl 10 mg BID -TID

## 2016-03-09 NOTE — Telephone Encounter (Signed)
Pt aware Bentyl has been sent to the pharmacy, she will call if this is not covered.

## 2016-03-13 LAB — GASTROINTESTINAL PATHOGEN PANEL PCR
C. difficile Tox A/B, PCR: NOT DETECTED
CRYPTOSPORIDIUM, PCR: NOT DETECTED
Campylobacter, PCR: NOT DETECTED
E COLI (ETEC) LT/ST, PCR: NOT DETECTED
E COLI (STEC) STX1/STX2, PCR: NOT DETECTED
E COLI 0157, PCR: NOT DETECTED
GIARDIA LAMBLIA, PCR: NOT DETECTED
Norovirus, PCR: NOT DETECTED
Rotavirus A, PCR: NOT DETECTED
SHIGELLA, PCR: NOT DETECTED
Salmonella, PCR: NOT DETECTED

## 2016-03-29 ENCOUNTER — Encounter: Payer: Self-pay | Admitting: Skilled Nursing Facility1

## 2016-03-29 ENCOUNTER — Encounter: Payer: Medicaid Other | Attending: Physician Assistant | Admitting: Skilled Nursing Facility1

## 2016-03-29 DIAGNOSIS — K912 Postsurgical malabsorption, not elsewhere classified: Secondary | ICD-10-CM | POA: Diagnosis not present

## 2016-03-29 DIAGNOSIS — Z713 Dietary counseling and surveillance: Secondary | ICD-10-CM | POA: Diagnosis not present

## 2016-03-29 NOTE — Patient Instructions (Addendum)
-  Goal for gym=3 days a week -Smoothie: Spinach or Carrots, Banatrol (banana flakes) OR Apple Powder OR other Pectin sources, Powder Protein, Unsweet Soy Milk, 1 piece of fruit -Be aware of the fat you add to your food -Chew your food very very well Like 20-30 chews per bite -If you want eggs again eat one at a time -Instead of almond milk try soy milk -Be careful with the fruit bars as they can cause diarrhea  -Do not eat fruit and the smoothie  -1 piece of fruit at a time  -Try Pedialyte for balance  -Make sure you take a Multi-Complete vitamin

## 2016-03-29 NOTE — Progress Notes (Signed)
  Medical Nutrition Therapy:  Appt start time: 8:09 end time:  9:09   Assessment:  Primary concerns today: referred for wt loss. Pt states she has been trying to work out. Pt states she cannot tolerate ensure or boost. Pt states she gets full quickly. Pt states she gest nausea, cramping, diarrhea every day. Pt states greasy foods, cabbage made with grease and meat, raw and cooked broccoli causes severe cramping, spicy foods, and high sugar foods. Pt states her sleep has been better since being prescribed medication. Pt states her stress level is fairly high. Pt states she listens to music to destress. Pt states she has gained wt since leaving her boyfriend. Pt states she was 96 pounds last month and then up to 102.8. Pt states she would like to be 115 pounds. Pt states her energy level is low. Pt states she just stopped breast feeding her child after four years accounting her a large part of her inability to gain wt as well as consistent diarrhea due to excess fruit and fruit juice.  Pt has previously worked with several other dietitians with no changes made in her lifestyle.   Preferred Learning Style:   No preference indicated    Learning Readiness:   Not ready  MEDICATIONS: See List   DIETARY INTAKE:  Usual eating pattern includes 3 meals and 2 snacks per day.  Everyday foods include fruit and fruit juice.  Avoided foods include .    24-hr recall:  B ( AM): pea protein powder with strawberries, thick strawberry juice, peaches, ice Snk ( AM): fruit L ( PM): fruit and cucumbers with salt and vinegar Snk ( PM): popsicle fruit bar D ( PM): white rice and butter with broccoli and shrimp Snk ( PM): popcorn Beverages: water, fruit juice, decaf tea, boiled tumeric and honey  Usual physical activity: wt lifting, walking and running for 30 minutes, arm wts, leg curls-for 60 for 2 times a week (just started)  Progress Towards Goal(s):  In progress.   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  As evidenced by frequent diarrhea and hx of wt loss.    Intervention:  Nutrition counseling for wt loss. Dietitian educated the pt on causes of diarrhea and the importance of taking a multivitamin.  Goals: -Goal for gym=3 days a week -Smoothie: Spinach or Carrots, Banatrol (banana flakes) OR Apple Powder OR other Pectin sources, Powder Protein, Unsweet Soy Milk, 1 piece of fruit -Be aware of the fat you add to your food -Chew your food very very well Like 20-30 chews per bite -If you want eggs again eat one at a time -Instead of almond milk try soy milk -Be careful with the fruit bars as they can cause diarrhea  -Do not eat fruit and the smoothie  -1 piece of fruit at a time  -Try Pedialyte for balance  -Make sure you take a Multi-Complete vitamin  Teaching Method Utilized:  Visual Auditory Hands on   Barriers to learning/adherence to lifestyle change: stress and love of fruit  Demonstrated degree of understanding via:  Teach Back

## 2016-04-02 ENCOUNTER — Telehealth: Payer: Self-pay | Admitting: Hematology and Oncology

## 2016-04-02 NOTE — Telephone Encounter (Signed)
10/05 Appointment rescheduled Per patient request to 10/09.

## 2016-04-05 ENCOUNTER — Ambulatory Visit: Payer: Medicaid Other | Admitting: Hematology and Oncology

## 2016-04-05 ENCOUNTER — Other Ambulatory Visit: Payer: Medicaid Other

## 2016-04-06 ENCOUNTER — Other Ambulatory Visit: Payer: Self-pay

## 2016-04-06 DIAGNOSIS — D500 Iron deficiency anemia secondary to blood loss (chronic): Secondary | ICD-10-CM

## 2016-04-09 ENCOUNTER — Ambulatory Visit (HOSPITAL_BASED_OUTPATIENT_CLINIC_OR_DEPARTMENT_OTHER): Payer: Medicaid Other | Admitting: Hematology and Oncology

## 2016-04-09 ENCOUNTER — Encounter: Payer: Self-pay | Admitting: Hematology and Oncology

## 2016-04-09 ENCOUNTER — Other Ambulatory Visit (HOSPITAL_BASED_OUTPATIENT_CLINIC_OR_DEPARTMENT_OTHER): Payer: Medicaid Other

## 2016-04-09 VITALS — BP 111/79 | HR 67 | Temp 97.9°F | Resp 18 | Ht 60.0 in | Wt 103.7 lb

## 2016-04-09 DIAGNOSIS — D500 Iron deficiency anemia secondary to blood loss (chronic): Secondary | ICD-10-CM

## 2016-04-09 LAB — IRON AND TIBC
%SAT: 17 % — AB (ref 21–57)
IRON: 42 ug/dL (ref 41–142)
TIBC: 249 ug/dL (ref 236–444)
UIBC: 207 ug/dL (ref 120–384)

## 2016-04-09 LAB — CBC WITH DIFFERENTIAL/PLATELET
BASO%: 0 % (ref 0.0–2.0)
Basophils Absolute: 0 10*3/uL (ref 0.0–0.1)
EOS ABS: 0.1 10*3/uL (ref 0.0–0.5)
EOS%: 1 % (ref 0.0–7.0)
HCT: 35.5 % (ref 34.8–46.6)
HEMOGLOBIN: 11.8 g/dL (ref 11.6–15.9)
LYMPH%: 32.9 % (ref 14.0–49.7)
MCH: 29.5 pg (ref 25.1–34.0)
MCHC: 33.2 g/dL (ref 31.5–36.0)
MCV: 88.8 fL (ref 79.5–101.0)
MONO#: 0.3 10*3/uL (ref 0.1–0.9)
MONO%: 4.7 % (ref 0.0–14.0)
NEUT%: 61.4 % (ref 38.4–76.8)
NEUTROS ABS: 3.5 10*3/uL (ref 1.5–6.5)
PLATELETS: 152 10*3/uL (ref 145–400)
RBC: 4 10*6/uL (ref 3.70–5.45)
RDW: 15.6 % — AB (ref 11.2–14.5)
WBC: 5.8 10*3/uL (ref 3.9–10.3)
lymph#: 1.9 10*3/uL (ref 0.9–3.3)

## 2016-04-09 LAB — FERRITIN: Ferritin: 115 ng/ml (ref 9–269)

## 2016-04-09 NOTE — Progress Notes (Signed)
Patient Care Team: Delorse Lek, MD as PCP - General (Family Medicine)  DIAGNOSIS: follow-up of iron deficiency anemia due to heavy menstrual bleeding Treatment summary: IV iron given July 2017  CHIEF COMPLIANT: follow-up of chronic iron deficiency anemia due to heavy menstrual bleeding  INTERVAL HISTORY: [name] is a 25 year old with above-mentioned history of iron deficiency anemia due to heavy menstrual bleeding who was given IV iron therapy in July 2017. She had a mild reaction to Feraheme. She had to be given Benadryl and corticosteroids. She had done well subsequently. She tells me that she did not get any great surgeon after receiving IV iron. Overall she has very poor sleep hygiene. Probably because of this she is feeling tired through the daytime.  REVIEW OF SYSTEMS:   Constitutional: Denies fevers, chills or abnormal weight loss Eyes: Denies blurriness of vision Ears, nose, mouth, throat, and face: Denies mucositis or sore throat Respiratory: Denies cough, dyspnea or wheezes Cardiovascular: Denies palpitation, chest discomfort Gastrointestinal:  Denies nausea, heartburn or change in bowel habits Skin: Denies abnormal skin rashes Lymphatics: Denies new lymphadenopathy or easy bruising Neurological:Denies numbness, tingling or new weaknesses Behavioral/Psych: Mood is stable, no new changes  Extremities: No lower extremity edema All other systems were reviewed with the patient and are negative.  I have reviewed the past medical history, past surgical history, social history and family history with the patient and they are unchanged from previous note.  ALLERGIES:  is allergic to penicillins and morphine and related.  MEDICATIONS:  Current Outpatient Prescriptions  Medication Sig Dispense Refill  . AMBULATORY NON FORMULARY MEDICATION Iron Infusions every 3 months done at [facility] ordered thru [facility]    . AMBULATORY NON FORMULARY MEDICATION  Bladder installation daily:  Heparin 10,000 units and bupivazaine 0.5%    . cholestyramine (QUESTRAN) 4 g packet Take 1 packet (4 g total) by mouth 2 (two) times daily. 60 each 6  . dicyclomine (BENTYL) 10 MG capsule Take 1 capsule (10 mg total) by mouth 3 (three) times daily before meals. 90 capsule 3  . hydroxychloroquine (PLAQUENIL) 200 MG tablet Take 400 mg by mouth every morning.     . hyoscyamine (LEVBID) 0.375 MG 12 hr tablet Take 1 tab 3 times daily as needed for cramping and spasms. 90 tablet 6  . levothyroxine (SYNTHROID, LEVOTHROID) 137 MCG tablet Take 137 mcg by mouth daily before breakfast.    . meloxicam (MOBIC) 15 MG tablet Take 15 mg by mouth daily.    . nitrofurantoin, macrocrystal-monohydrate, (MACROBID) 100 MG capsule Take 1 capsule (100 mg total) by mouth 2 (two) times daily. Take one capsule BID x 10 days 20 capsule 0  . NONFORMULARY OR COMPOUNDED ITEM Gabapentin 6% cream, apply a pea sized amount to the vulva qhs and up to TID prn. #30 grams, no refills 1 each 0  . NONFORMULARY OR COMPOUNDED ITEM Lidocaine 5% cream in a neutral base. Apply a pea sized amount topically up to 6 x a day. 30 grams, no refills. 1 each 0  . OMEPRAZOLE PO Take 20 mg by mouth at bedtime.     . pilocarpine (SALAGEN) 5 MG tablet Take 5 mg by mouth at bedtime.    Marland Kitchen QUEtiapine (SEROQUEL) 300 MG tablet Take 300 mg by mouth at bedtime.    Marland Kitchen tiZANidine (ZANAFLEX) 4 MG tablet Take 4 mg by mouth every 8 (eight) hours as needed for muscle spasms.      No current facility-administered medications  for this visit.     PHYSICAL EXAMINATION: ECOG PERFORMANCE STATUS: 1 - Symptomatic but completely ambulatory  Vitals:   04/09/16 1513  BP: 111/79  Pulse: 67  Resp: 18  Temp: 97.9 F (36.6 C)   Filed Weights   04/09/16 1513  Weight: 103 lb 11.2 oz (47 kg)    GENERAL:alert, no distress and comfortable SKIN: skin color, texture, turgor are normal, no rashes or significant lesions EYES: normal, Conjunctiva  are pink and non-injected, sclera clear OROPHARYNX:no exudate, no erythema and lips, buccal mucosa, and tongue normal  NECK: supple, thyroid normal size, non-tender, without nodularity LYMPH:  no palpable lymphadenopathy in the cervical, axillary or inguinal LUNGS: clear to auscultation and percussion with normal breathing effort HEART: regular rate & rhythm and no murmurs and no lower extremity edema ABDOMEN:abdomen soft, non-tender and normal bowel sounds MUSCULOSKELETAL:no cyanosis of digits and no clubbing  NEURO: alert & oriented x 3 with fluent speech, no focal motor/sensory deficits EXTREMITIES: No lower extremity edema  LABORATORY DATA:  I have reviewed the data as listed   Chemistry      Component Value Date/Time   NA 139 03/07/2016 1213   K 3.7 03/07/2016 1213   CL 107 03/07/2016 1213   CO2 28 03/07/2016 1213   BUN 11 03/07/2016 1213   CREATININE 0.54 03/07/2016 1213      Component Value Date/Time   CALCIUM 9.0 03/07/2016 1213   ALKPHOS 93 03/06/2013 1522   AST 17 03/06/2013 1522   ALT 19 03/06/2013 1522   BILITOT 3.3 (H) 03/06/2013 1522   BILITOT 3.5 (H) 03/06/2013 1522       Lab Results  Component Value Date   WBC 5.8 04/09/2016   HGB 11.8 04/09/2016   HCT 35.5 04/09/2016   MCV 88.8 04/09/2016   PLT 152 04/09/2016   NEUTROABS 3.5 04/09/2016     ASSESSMENT & PLAN:  Iron deficiency anemia due to chronic blood loss Patient has had heavy menstrual bleeding accompanied by urinary tract bleeding from chronic interstitial cystitis. She has been on oral iron therapy since 2012 although at times she was taken off iron supplementation. Since February she has been anemic with a hemoglobin of 10.6-10.9. In spite of being on oral iron treatment her hemoglobin has remained low. She continues to have symptoms related to iron deficiency with fatigue, ice craving, headaches etc.  Treatment summary: IV iron given July 2017  Recommendation: 1. awaiting iron studies done  today  2. Return to clinic in 3 months with recheck of iron studies and ferritin 3. I will call her with the results of iron studies. We can then decide if she needs any additional IV iron. Patient continues to have intermittent heavy menstrual bleedings. She will discuss with her gynecologist about that.  Return to clinic in 3 months   Orders Placed This Encounter  Procedures  . CBC & Diff and Retic    Standing Status:   Future    Standing Expiration Date:   04/09/2017  . Iron and TIBC    Standing Status:   Future    Standing Expiration Date:   04/09/2017  . Ferritin    Standing Status:   Future    Standing Expiration Date:   04/09/2017   The patient has a good understanding of the overall plan. she agrees with it. she will call with any problems that may develop before the next visit here.   Sabas SousGudena, Jayse Hodkinson K, MD 04/09/16

## 2016-04-09 NOTE — Assessment & Plan Note (Signed)
Patient has had heavy menstrual bleeding accompanied by urinary tract bleeding from chronic interstitial cystitis. She has been on oral iron therapy since 2012 although at times she was taken off iron supplementation. Since February she has been anemic with a hemoglobin of 10.6-10.9. In spite of being on oral iron treatment her hemoglobin has remained low. She continues to have symptoms related to iron deficiency with fatigue, ice craving, headaches etc.  Treatment summary: IV iron given July 2017  Recommendation: 1. awaiting iron studies done today  2. Return to clinic in 3 months with recheck of iron studies and ferritin 3. I will call her with the results of iron studies. We can then decide if she needs any additional IV iron. Patient continues to have intermittent heavy menstrual bleedings. She will discuss with her gynecologist about that.  Return to clinic in 3 months

## 2016-05-11 ENCOUNTER — Encounter: Payer: Self-pay | Admitting: Skilled Nursing Facility1

## 2016-05-11 ENCOUNTER — Encounter: Payer: Medicaid Other | Attending: Physician Assistant | Admitting: Skilled Nursing Facility1

## 2016-05-11 DIAGNOSIS — Z713 Dietary counseling and surveillance: Secondary | ICD-10-CM | POA: Insufficient documentation

## 2016-05-11 DIAGNOSIS — K912 Postsurgical malabsorption, not elsewhere classified: Secondary | ICD-10-CM | POA: Diagnosis not present

## 2016-05-11 NOTE — Patient Instructions (Addendum)
-  Try cooking the chicken soup protein for 15 seconds  -Try 4 ounces of unflavored kefir  -Banatrol (banana flakes) OR Apple Powder OR other Pectin sources   -Keep drinking the soy milk  -Add Chicken soup protein powder to your rice  -[employer] recipes

## 2016-05-11 NOTE — Progress Notes (Signed)
  Medical Nutrition Therapy:  Appt start time: 8:09 end time:  9:09   Assessment:  Primary concerns today: referred for wt loss.  Pt states she has been very stressed laatly due to her daughters father, extreme pain, and being told she cannot continue her labs in school due to her health and returns having lost 4 pounds. Pt states she tried the protein supplements and protein bars which resulted in flare ups. Pt states she is trying A new devise to control sensations. Pt states Vanilla and unflavored protein powders were tolerated.-tried it with coconut milk and in her smoothies. Pt states she makes pedialyte into ice because it is too salty to drink. Pt states she has had diarrhea 5 times a day with really really bad cramping/stomach spasms. Pt states she has had blood in her stool as well. Pt states she no longer eats the fruit popcycle. Pt states her smoothies are Fruit, milk, lactose free milk, protein powder. stress from pain and school. Pt was given many pea protein samples.   Preferred Learning Style:   No preference indicated    Learning Readiness:   Not ready  MEDICATIONS: See List   DIETARY INTAKE:  Usual eating pattern includes 3 meals and 2 snacks per day.  Everyday foods include fruit and fruit juice.  Avoided foods include .    24-hr recall:  B ( AM): 3 eggs 1 pear Snk ( AM): fruit L ( PM): crackers and swiss cheese Snk ( PM): fruit and crackers D ( PM):  Snk ( PM): chicken wrap Beverages: grape juice, lemonade, herbal hot tea with honey  Usual physical activity: wt lifting, walking and running for 30 minutes, arm wts, leg curls-for 60 for 2 times a week -Kept up with the activity except for this week   Progress Towards Goal(s):  In progress.   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  As evidenced by frequent diarrhea and hx of wt loss.    Intervention:  Nutrition counseling for wt loss. Dietitian educated the pt on  causes of diarrhea and the importance of taking a multivitamin.  Goals: -Try cooking the chicken soup protein for 15 seconds  -Try 4 ounces of unflavored kefir  -Banatrol (banana flakes) OR Apple Powder OR other Pectin sources   -Keep drinking the soy milk  -Add Chicken soup protein powder to your rice  -[employer] recipes Visual Auditory Hands on   Barriers to learning/adherence to lifestyle change: stress and love of fruit  Demonstrated degree of understanding via:  Teach Back

## 2016-07-10 ENCOUNTER — Ambulatory Visit (HOSPITAL_BASED_OUTPATIENT_CLINIC_OR_DEPARTMENT_OTHER): Payer: Medicaid Other

## 2016-07-10 DIAGNOSIS — D500 Iron deficiency anemia secondary to blood loss (chronic): Secondary | ICD-10-CM

## 2016-07-10 LAB — CBC & DIFF AND RETIC
BASO%: 0 % (ref 0.0–2.0)
Basophils Absolute: 0 10*3/uL (ref 0.0–0.1)
EOS%: 0.9 % (ref 0.0–7.0)
Eosinophils Absolute: 0.1 10*3/uL (ref 0.0–0.5)
HCT: 35.2 % (ref 34.8–46.6)
HGB: 11.7 g/dL (ref 11.6–15.9)
IMMATURE RETIC FRACT: 6.3 % (ref 1.60–10.00)
LYMPH#: 2.2 10*3/uL (ref 0.9–3.3)
LYMPH%: 38.2 % (ref 14.0–49.7)
MCH: 29.5 pg (ref 25.1–34.0)
MCHC: 33.2 g/dL (ref 31.5–36.0)
MCV: 88.9 fL (ref 79.5–101.0)
MONO#: 0.3 10*3/uL (ref 0.1–0.9)
MONO%: 4.9 % (ref 0.0–14.0)
NEUT%: 56 % (ref 38.4–76.8)
NEUTROS ABS: 3.2 10*3/uL (ref 1.5–6.5)
PLATELETS: 165 10*3/uL (ref 145–400)
RBC: 3.96 10*6/uL (ref 3.70–5.45)
RDW: 13.2 % (ref 11.2–14.5)
RETIC %: 2.1 % (ref 0.70–2.10)
RETIC CT ABS: 83.16 10*3/uL (ref 33.70–90.70)
WBC: 5.7 10*3/uL (ref 3.9–10.3)

## 2016-07-11 ENCOUNTER — Other Ambulatory Visit: Payer: Medicaid Other

## 2016-07-11 LAB — IRON AND TIBC
%SAT: 18 % — AB (ref 21–57)
Iron: 43 ug/dL (ref 41–142)
TIBC: 244 ug/dL (ref 236–444)
UIBC: 201 ug/dL (ref 120–384)

## 2016-07-11 LAB — FERRITIN: Ferritin: 156 ng/ml (ref 9–269)

## 2016-07-11 NOTE — Assessment & Plan Note (Signed)
Patient has had heavy menstrual bleeding accompanied by urinary tract bleeding from chronic interstitial cystitis. She has been on oral iron therapy since 2012 although at times she was taken off iron supplementation. Since February she has been anemic with a hemoglobin of 10.6-10.9. In spite of being on oral iron treatment her hemoglobin has remained low. She continues to have symptoms related to iron deficiency with fatigue, ice craving, headaches etc.  Treatment summary: IV iron given July 2017  Recommendation: 1. awaiting iron studies done today  2. Return to clinic in 3 months with recheck of iron studies and ferritin  Patient continues to have intermittent heavy menstrual bleedings. She will discuss with her gynecologist about that.  Return to clinic in 3 months

## 2016-07-12 ENCOUNTER — Encounter: Payer: Self-pay | Admitting: Hematology and Oncology

## 2016-07-12 ENCOUNTER — Ambulatory Visit (HOSPITAL_BASED_OUTPATIENT_CLINIC_OR_DEPARTMENT_OTHER): Payer: Medicaid Other | Admitting: Hematology and Oncology

## 2016-07-12 VITALS — BP 114/78 | HR 81 | Temp 97.8°F | Resp 16 | Ht 60.0 in | Wt 98.3 lb

## 2016-07-12 DIAGNOSIS — D500 Iron deficiency anemia secondary to blood loss (chronic): Secondary | ICD-10-CM | POA: Diagnosis not present

## 2016-07-12 NOTE — Progress Notes (Signed)
Patient Care Team: Delorse LekBrent A Burnett, MD as PCP - General (Family Medicine)  DIAGNOSIS:  Encounter Diagnosis  Name Primary?  . Iron deficiency anemia due to chronic blood loss Yes   CHIEF COMPLIANT: Follow-up on iron deficiency anemia  INTERVAL HISTORY: [name] is a 26 year old with above-mentioned history of iron deficiency anemia who required IV iron infusion in July 2017. She is here for a routine follow-up. She reports no new symptoms or concerns. She denies any fatigue or shortness of breath to exertion. Iron deficiency anemia and was related to heavy menstrual bleeding. These symptoms have improved after she received IV iron treatment. Patient does be that her heavy menstrual cycles have slowed down. She does not have any symptoms of pica.  REVIEW OF SYSTEMS:   Constitutional: Denies fevers, chills or abnormal weight loss Eyes: Denies blurriness of vision Ears, nose, mouth, throat, and face: Denies mucositis or sore throat Respiratory: Denies cough, dyspnea or wheezes Cardiovascular: Denies palpitation, chest discomfort Gastrointestinal:  Denies nausea, heartburn or change in bowel habits Skin: Denies abnormal skin rashes Lymphatics: Denies new lymphadenopathy or easy bruising Neurological:Denies numbness, tingling or new weaknesses Behavioral/Psych: Mood is stable, no new changes  Extremities: No lower extremity edema All other systems were reviewed with the patient and are negative.  I have reviewed the past medical history, past surgical history, social history and family history with the patient and they are unchanged from previous note.  ALLERGIES:  is allergic to penicillins and morphine and related.  MEDICATIONS:  Current Outpatient Prescriptions  Medication Sig Dispense Refill  . AMBULATORY NON FORMULARY MEDICATION [facility]    . AMBULATORY NON FORMULARY MEDICATION Bladder  installation daily:  Heparin 10,000 units and bupivazaine 0.5%    . cholestyramine (QUESTRAN) 4 g packet Take 1 packet (4 g total) by mouth 2 (two) times daily. 60 each 6  . dicyclomine (BENTYL) 10 MG capsule Take 1 capsule (10 mg total) by mouth 3 (three) times daily before meals. 90 capsule 3  . hydroxychloroquine (PLAQUENIL) 200 MG tablet Take 400 mg by mouth every morning.     . hyoscyamine (LEVBID) 0.375 MG 12 hr tablet Take 1 tab 3 times daily as needed for cramping and spasms. 90 tablet 6  . levothyroxine (SYNTHROID, LEVOTHROID) 137 MCG tablet Take 137 mcg by mouth daily before breakfast.    . meloxicam (MOBIC) 15 MG tablet Take 15 mg by mouth daily.    . nitrofurantoin, macrocrystal-monohydrate, (MACROBID) 100 MG capsule Take 1 capsule (100 mg total) by mouth 2 (two) times daily. Take one capsule BID x 10 days 20 capsule 0  . NONFORMULARY OR COMPOUNDED ITEM Gabapentin 6% cream, apply a pea sized amount to the vulva qhs and up to TID prn. #30 grams, no refills 1 each 0  . NONFORMULARY OR COMPOUNDED ITEM Lidocaine 5% cream in a neutral base. Apply a pea sized amount topically up to 6 x a day. 30 grams, no refills. 1 each 0  . OMEPRAZOLE PO Take 20 mg by mouth at bedtime.     . pilocarpine (SALAGEN) 5 MG tablet Take 5 mg by mouth at bedtime.    Marland Kitchen. QUEtiapine (SEROQUEL) 300 MG tablet Take 300 mg by mouth at bedtime.    Marland Kitchen. tiZANidine (ZANAFLEX) 4 MG tablet Take 4 mg by mouth every 8 (eight) hours as needed for muscle spasms.      No current facility-administered medications for  this visit.     PHYSICAL EXAMINATION: ECOG PERFORMANCE STATUS: 1 - Symptomatic but completely ambulatory  Vitals:   07/12/16 1349  BP: 114/78  Pulse: 81  Resp: 16  Temp: 97.8 F (36.6 C)   Filed Weights   07/12/16 1349  Weight: 98 lb 4.8 oz (44.6 kg)    GENERAL:alert, no distress and comfortable SKIN: Vitiligo on the face EYES: normal, Conjunctiva are pink and non-injected, sclera clear OROPHARYNX:no  exudate, no erythema and lips, buccal mucosa, and tongue normal  NECK: supple, thyroid normal size, non-tender, without nodularity LYMPH:  no palpable lymphadenopathy in the cervical, axillary or inguinal LUNGS: clear to auscultation and percussion with normal breathing effort HEART: regular rate & rhythm and no murmurs and no lower extremity edema ABDOMEN:abdomen soft, non-tender and normal bowel sounds MUSCULOSKELETAL:no cyanosis of digits and no clubbing  NEURO: alert & oriented x 3 with fluent speech, no focal motor/sensory deficits EXTREMITIES: No lower extremity edema  LABORATORY DATA:  I have reviewed the data as listed   Chemistry      Component Value Date/Time   NA 139 03/07/2016 1213   K 3.7 03/07/2016 1213   CL 107 03/07/2016 1213   CO2 28 03/07/2016 1213   BUN 11 03/07/2016 1213   CREATININE 0.54 03/07/2016 1213      Component Value Date/Time   CALCIUM 9.0 03/07/2016 1213   ALKPHOS 93 03/06/2013 1522   AST 17 03/06/2013 1522   ALT 19 03/06/2013 1522   BILITOT 3.3 (H) 03/06/2013 1522   BILITOT 3.5 (H) 03/06/2013 1522       Lab Results  Component Value Date   WBC 5.7 07/10/2016   HGB 11.7 07/10/2016   HCT 35.2 07/10/2016   MCV 88.9 07/10/2016   PLT 165 07/10/2016   NEUTROABS 3.2 07/10/2016    ASSESSMENT & PLAN:  Iron deficiency anemia due to chronic blood loss Patient has had heavy menstrual bleeding accompanied by urinary tract bleeding from chronic interstitial cystitis. She has been on oral iron therapy since 2012 although at times she was taken off iron supplementation. Since February she has been anemic with a hemoglobin of 10.6-10.9. In spite of being on oral iron treatment her hemoglobin has remained low. She continued to have symptoms related to iron deficiency with fatigue, ice craving, headaches etc. all of these symptoms improved with IV iron treatment given July 2017  Treatment summary: IV iron given July 2017  Blood work 2 days ago revealed  hemoglobin of 11.7, iron saturation of 17%, ferritin of 156  Recommendation: Return to clinic in 6 months with recheck of iron studies and ferritin Her menstrual cycles have not been too heavy lately. Follow with GYN  Return to clinic in 6 months  I spent 15 minutes talking to the patient of which more than half was spent in counseling and coordination of care.  Orders Placed This Encounter  Procedures  . CBC with Differential    Standing Status:   Future    Standing Expiration Date:   07/12/2017  . Iron and TIBC    Standing Status:   Future    Standing Expiration Date:   07/12/2017  . Ferritin    Standing Status:   Future    Standing Expiration Date:   07/12/2017   The patient has a good understanding of the overall plan. she agrees with it. she will call with any problems that may develop before the next visit here.   Sabas Sous, MD 07/12/16

## 2016-08-08 ENCOUNTER — Other Ambulatory Visit: Payer: Self-pay | Admitting: Rheumatology

## 2016-08-09 NOTE — Telephone Encounter (Signed)
Attempted to contact the patient and left message for patient to call the office.  

## 2016-08-10 ENCOUNTER — Other Ambulatory Visit: Payer: Self-pay | Admitting: *Deleted

## 2016-08-10 MED ORDER — PILOCARPINE HCL 5 MG PO TABS: 5.0000 mg | ORAL_TABLET | Freq: Two times a day (BID) | ORAL | 0 refills | Status: DC

## 2016-08-10 NOTE — Telephone Encounter (Signed)
Last Visit: 02/06/16 Next Visit was due January 2018. Message sent to the front to schedule patient.   Okay to refill Pilocarpine?

## 2016-08-13 ENCOUNTER — Telehealth: Payer: Self-pay | Admitting: Rheumatology

## 2016-08-13 NOTE — Telephone Encounter (Signed)
Left message on machine for patient to call back to schedule follow up.  

## 2016-08-13 NOTE — Telephone Encounter (Signed)
-----   Message from Henriette CombsAndrea L Hatton, LPN sent at 1/6/10962/03/2017 11:14 AM EST ----- Regarding: Please schedule patient for follow up appointment  Please schedule patient for follow up appointment. Patient was due January 2018. Thanks!

## 2016-09-27 MED FILL — BD NEEDLES 18GX1.5: 18G X 1-1/2 | 25 days supply | Qty: 50 | Fill #2

## 2016-09-27 MED FILL — BUPIVACAINE 0.5% VIAL: 0.5 | 10 days supply | Qty: 150 | Fill #2

## 2016-09-27 MED FILL — MONOJECT DISP SYRINGE 20 ML: 20 ML | 30 days supply | Qty: 30 | Fill #2

## 2016-09-27 MED FILL — BD NEEDLES 18GX1.5": 18G X 1-1/2 | 25 days supply | Qty: 50 | Fill #2

## 2016-09-27 MED FILL — HEPARIN SOD 10,000 UNIT/ML: 10000 | 30 days supply | Qty: 120 | Fill #2

## 2017-01-07 ENCOUNTER — Other Ambulatory Visit (HOSPITAL_BASED_OUTPATIENT_CLINIC_OR_DEPARTMENT_OTHER): Payer: Medicaid Other

## 2017-01-07 DIAGNOSIS — D500 Iron deficiency anemia secondary to blood loss (chronic): Secondary | ICD-10-CM | POA: Diagnosis not present

## 2017-01-07 LAB — CBC WITH DIFFERENTIAL/PLATELET
BASO%: 0 % (ref 0.0–2.0)
Basophils Absolute: 0 10*3/uL (ref 0.0–0.1)
EOS%: 0.9 % (ref 0.0–7.0)
Eosinophils Absolute: 0 10*3/uL (ref 0.0–0.5)
HEMATOCRIT: 41.4 % (ref 34.8–46.6)
HEMOGLOBIN: 13.6 g/dL (ref 11.6–15.9)
LYMPH#: 1.9 10*3/uL (ref 0.9–3.3)
LYMPH%: 43.4 % (ref 14.0–49.7)
MCH: 29.4 pg (ref 25.1–34.0)
MCHC: 32.9 g/dL (ref 31.5–36.0)
MCV: 89.4 fL (ref 79.5–101.0)
MONO#: 0.3 10*3/uL (ref 0.1–0.9)
MONO%: 6.5 % (ref 0.0–14.0)
NEUT#: 2.1 10*3/uL (ref 1.5–6.5)
NEUT%: 49.2 % (ref 38.4–76.8)
Platelets: 230 10*3/uL (ref 145–400)
RBC: 4.63 10*6/uL (ref 3.70–5.45)
RDW: 14.3 % (ref 11.2–14.5)
WBC: 4.3 10*3/uL (ref 3.9–10.3)

## 2017-01-08 LAB — IRON AND TIBC
%SAT: 26 % (ref 21–57)
Iron: 86 ug/dL (ref 41–142)
TIBC: 325 ug/dL (ref 236–444)
UIBC: 240 ug/dL (ref 120–384)

## 2017-01-08 LAB — FERRITIN: Ferritin: 73 ng/ml (ref 9–269)

## 2017-01-09 ENCOUNTER — Encounter: Payer: Self-pay | Admitting: Hematology and Oncology

## 2017-01-09 ENCOUNTER — Ambulatory Visit (HOSPITAL_BASED_OUTPATIENT_CLINIC_OR_DEPARTMENT_OTHER): Payer: Medicaid Other | Admitting: Hematology and Oncology

## 2017-01-09 DIAGNOSIS — D500 Iron deficiency anemia secondary to blood loss (chronic): Secondary | ICD-10-CM

## 2017-01-09 NOTE — Assessment & Plan Note (Addendum)
Patient has had heavy menstrual bleeding accompanied by urinary tract bleeding from chronic interstitial cystitis. She has been on oral iron therapy since 2012 although at times she was taken off iron supplementation. Since February she has been anemic with a hemoglobin of 10.6-10.9. In spite of being on oral iron treatment her hemoglobin has remained low. She continued to have symptoms related to iron deficiency with fatigue, ice craving, headaches etc. all of these symptoms improved with IV iron treatment given July 2017  Treatment summary: IV iron given July 2017 Lab review: 01/04/2017: Ferritin 73, hemoglobin 13.6, iron saturation 26%, TIBC 325  Based on the blood work, patient does not have any further issues with iron deficiency. I encouraged her to eat iron-containing diet and come back to see us on an as-needed basis

## 2017-01-09 NOTE — Progress Notes (Signed)
Patient Care Team: Delorse Lek, MD as PCP - General (Family Medicine)  DIAGNOSIS:  Encounter Diagnosis  Name Primary?  . Iron deficiency anemia due to chronic blood loss     CHIEF COMPLIANT: Follow-up to review blood work for iron deficiency anemia  INTERVAL HISTORY: [name] is a 26 year old with above-mentioned history of iron deficiency anemia due to blood loss and poor dietary intake of iron. She was given IV iron one time and since then she has done very well without requiring any further IV iron treatment. She tells me that she is eating better and taking care of her health. She denies any fatigue or shortness of breath exertion.  REVIEW OF SYSTEMS:   Constitutional: Denies fevers, chills or abnormal weight loss Eyes: Denies blurriness of vision Ears, nose, mouth, throat, and face: Denies mucositis or sore throat Respiratory: Denies cough, dyspnea or wheezes Cardiovascular: Denies palpitation, chest discomfort Gastrointestinal:  Denies nausea, heartburn or change in bowel habits Skin: Denies abnormal skin rashes Lymphatics: Denies new lymphadenopathy or easy bruising Neurological:Denies numbness, tingling or new weaknesses Behavioral/Psych: Mood is stable, no new changes  Extremities: No lower extremity edema All other systems were reviewed with the patient and are negative.  I have reviewed the past medical history, past surgical history, social history and family history with the patient and they are unchanged from previous note.  ALLERGIES:  is allergic to penicillins and morphine and related.  MEDICATIONS:  Current Outpatient Prescriptions  Medication Sig Dispense Refill  . AMBULATORY NON FORMULARY MEDICATION Iron Infusions every 3 months done at [facility]- ordered thru [facility]    . AMBULATORY NON FORMULARY MEDICATION Bladder installation daily:  Heparin 10,000 units and bupivazaine 0.5%    . cholestyramine (QUESTRAN) 4 g packet Take 1  packet (4 g total) by mouth 2 (two) times daily. 60 each 6  . dicyclomine (BENTYL) 10 MG capsule Take 1 capsule (10 mg total) by mouth 3 (three) times daily before meals. 90 capsule 3  . hydroxychloroquine (PLAQUENIL) 200 MG tablet Take 400 mg by mouth every morning.     . hyoscyamine (LEVBID) 0.375 MG 12 hr tablet Take 1 tab 3 times daily as needed for cramping and spasms. 90 tablet 6  . levothyroxine (SYNTHROID, LEVOTHROID) 137 MCG tablet Take 137 mcg by mouth daily before breakfast.    . meloxicam (MOBIC) 15 MG tablet Take 15 mg by mouth daily.    . nitrofurantoin, macrocrystal-monohydrate, (MACROBID) 100 MG capsule Take 1 capsule (100 mg total) by mouth 2 (two) times daily. Take one capsule BID x 10 days 20 capsule 0  . NONFORMULARY OR COMPOUNDED ITEM Gabapentin 6% cream, apply a pea sized amount to the vulva qhs and up to TID prn. #30 grams, no refills 1 each 0  . NONFORMULARY OR COMPOUNDED ITEM Lidocaine 5% cream in a neutral base. Apply a pea sized amount topically up to 6 x a day. 30 grams, no refills. 1 each 0  . OMEPRAZOLE PO Take 20 mg by mouth at bedtime.     . pilocarpine (SALAGEN) 5 MG tablet Take 1 tablet (5 mg total) by mouth 2 (two) times daily. 60 tablet 0  . QUEtiapine (SEROQUEL) 300 MG tablet Take 300 mg by mouth at bedtime.    Marland Kitchen tiZANidine (ZANAFLEX) 4 MG tablet Take 4 mg by mouth every 8 (eight) hours as needed for muscle spasms.      No current facility-administered medications for this visit.  PHYSICAL EXAMINATION: ECOG PERFORMANCE STATUS: 1 - Symptomatic but completely ambulatory  Vitals:   01/09/17 1421  BP: (!) 122/96  Pulse: 71  Resp: 18  Temp: 98.2 F (36.8 C)   Filed Weights   01/09/17 1421  Weight: 96 lb 14.4 oz (44 kg)    GENERAL:alert, no distress and comfortable SKIN: skin color, texture, turgor are normal, no rashes or significant lesions EYES: normal, Conjunctiva are pink and non-injected, sclera clear OROPHARYNX:no exudate, no erythema and  lips, buccal mucosa, and tongue normal  NECK: supple, thyroid normal size, non-tender, without nodularity LYMPH:  no palpable lymphadenopathy in the cervical, axillary or inguinal LUNGS: clear to auscultation and percussion with normal breathing effort HEART: regular rate & rhythm and no murmurs and no lower extremity edema ABDOMEN:abdomen soft, non-tender and normal bowel sounds MUSCULOSKELETAL:no cyanosis of digits and no clubbing  NEURO: alert & oriented x 3 with fluent speech, no focal motor/sensory deficits EXTREMITIES: No lower extremity edema  LABORATORY DATA:  I have reviewed the data as listed   Chemistry      Component Value Date/Time   NA 139 03/07/2016 1213   K 3.7 03/07/2016 1213   CL 107 03/07/2016 1213   CO2 28 03/07/2016 1213   BUN 11 03/07/2016 1213   CREATININE 0.54 03/07/2016 1213      Component Value Date/Time   CALCIUM 9.0 03/07/2016 1213   ALKPHOS 93 03/06/2013 1522   AST 17 03/06/2013 1522   ALT 19 03/06/2013 1522   BILITOT 3.3 (H) 03/06/2013 1522   BILITOT 3.5 (H) 03/06/2013 1522       Lab Results  Component Value Date   WBC 4.3 01/07/2017   HGB 13.6 01/07/2017   HCT 41.4 01/07/2017   MCV 89.4 01/07/2017   PLT 230 01/07/2017   NEUTROABS 2.1 01/07/2017    ASSESSMENT & PLAN:  Iron deficiency anemia due to chronic blood loss Patient has had heavy menstrual bleeding accompanied by urinary tract bleeding from chronic interstitial cystitis. She has been on oral iron therapy since 2012 although at times she was taken off iron supplementation. Since February she has been anemic with a hemoglobin of 10.6-10.9. In spite of being on oral iron treatment her hemoglobin has remained low. She continued to have symptoms related to iron deficiency with fatigue, ice craving, headaches etc. all of these symptoms improved with IV iron treatment given July 2017  Treatment summary: IV iron given July 2017 Lab review: 01/04/2017: Ferritin 73, hemoglobin 13.6, iron  saturation 26%, TIBC 325  Based on the blood work, patient does not have any further issues with iron deficiency. I encouraged her to eat iron-containing diet and come back to see us on an as-needed basis   I spent 15 minutes talking to the patient of which more than half was spent in counseling and coordination of care.  No orders of the defined types were placed in this encounter.  The patient has a good understanding of the overall plan. she agrees with it. she will call with any problems that may develop before the next visit here.   Sabas SousGudena, Esai Stecklein K, MD 01/09/17

## 2017-02-25 MED FILL — HEPARIN SOD 10,000 UNIT/ML: 10000 | 30 days supply | Qty: 120 | Fill #0

## 2017-02-25 MED FILL — SENSORCAINE-MPF 0.5 % SOLN: 0.5 | 15 days supply | Qty: 450 | Fill #0

## 2017-02-27 DIAGNOSIS — G44209 Tension-type headache, unspecified, not intractable: Secondary | ICD-10-CM | POA: Insufficient documentation

## 2017-03-08 ENCOUNTER — Other Ambulatory Visit (INDEPENDENT_AMBULATORY_CARE_PROVIDER_SITE_OTHER): Payer: Medicaid Other

## 2017-03-08 ENCOUNTER — Encounter: Payer: Self-pay | Admitting: Nurse Practitioner

## 2017-03-08 ENCOUNTER — Ambulatory Visit (INDEPENDENT_AMBULATORY_CARE_PROVIDER_SITE_OTHER): Payer: Medicaid Other | Admitting: Nurse Practitioner

## 2017-03-08 VITALS — BP 106/82 | HR 84 | Ht 60.5 in | Wt 93.5 lb

## 2017-03-08 DIAGNOSIS — K625 Hemorrhage of anus and rectum: Secondary | ICD-10-CM | POA: Diagnosis not present

## 2017-03-08 DIAGNOSIS — R1030 Lower abdominal pain, unspecified: Secondary | ICD-10-CM

## 2017-03-08 DIAGNOSIS — K529 Noninfective gastroenteritis and colitis, unspecified: Secondary | ICD-10-CM | POA: Diagnosis not present

## 2017-03-08 LAB — BASIC METABOLIC PANEL
BUN: 8 mg/dL (ref 6–23)
CO2: 24 mEq/L (ref 19–32)
Calcium: 9.8 mg/dL (ref 8.4–10.5)
Chloride: 105 mEq/L (ref 96–112)
Creatinine, Ser: 0.78 mg/dL (ref 0.40–1.20)
GFR: 112.66 mL/min (ref >60.00)
Glucose, Bld: 74 mg/dL (ref 70–99)
POTASSIUM: 3.5 meq/L (ref 3.5–5.1)
SODIUM: 137 meq/L (ref 135–145)

## 2017-03-08 MED ORDER — DICYCLOMINE HCL 10 MG PO CAPS: 10.0000 mg | ORAL_CAPSULE | Freq: Two times a day (BID) | ORAL | 1 refills | Status: DC

## 2017-03-08 MED ORDER — DIPHENOXYLATE-ATROPINE 2.5-0.025 MG PO TABS: ORAL_TABLET | ORAL | 0 refills | Status: DC

## 2017-03-08 NOTE — Patient Instructions (Signed)
If you are age 28 or older, your body mass index should be between 23-30. Your Body mass index is 17.96 kg/m. If this is out of the aforementioned range listed, please consider follow up with your Primary Care Provider.  If you are age 28 or younger, your body mass index should be between 19-25. Your Body mass index is 17.96 kg/m. If this is out of the aformentioned range listed, please consider follow up with your Primary Care Provider.   Your provider has requested that you go to the basement for lab work before leaving today.   Thank you for choosing [facility]

## 2017-03-08 NOTE — Progress Notes (Signed)
HPI: Patient is a 26 yo female with a complicated medical history. 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 She has lupus, Grave's disease and Sjogren's . She was last seen here one year ago. At that time she has acute exacerbation of chronic diarrhea. She was also having cramping and rectal bleeding. Stools studies were negative and no acute findings on CTscan. Questran increased and CT scan. She had a normal colonoscopy in 2015.   Fredderick Severance returns with same symptoms as last year. She has worsening of chronic diarrhea, recurrent abdominal pain and intermittent bleeding. No recent antibiotics. Taking a pk of cholestyramine daily but still having numerrous loose stools. She has almost constant periumbilical / lower abdominal cramping usually relieved with with defecation. She takes Omeprazole for reflux.   Past Medical History:  Diagnosis Date  . Bladder mass   . Chronic interstitial cystitis with hematuria    2016  . Fibromyalgia   . GERD (gastroesophageal reflux disease)   . Gross hematuria   . History of Graves' disease    tx done in 2007  . History of migraine   . History of recurrent UTIs   . Hypothyroidism   . Iron deficiency anemia    due to SGS  . Lower urinary tract symptoms (LUTS)   . Lupus    rheumologist--  dr Janalyn Rouse devashwer  . Malabsorption syndrome   . Passage of loose stools    chronic --  due to short gut syndrome  . Pleuritis   . S/P radioactive iodine thyroid ablation    2007  . SGS (short gut syndrome)   . Sjogren's disease (HCC)   . Vitiligo   . Wears glasses     Patient's surgical history, family medical history, social history, medications and allergies were all reviewed in Epic    Physical Exam: BP 106/82 (BP Location: Left Arm, Patient Position: Sitting, Cuff Size: Normal)   Pulse 84   Ht 5' 0.5" (1.537 m) Comment: height measured without shoes  Wt 93 lb 8 oz (42.4 kg)    LMP 03/07/2017   BMI 17.96 kg/m   GENERAL: well developed female in NAD PSYCH: :Pleasant, cooperative, normal affect EENT:  conjunctiva pink, mucous membranes moist, neck supple without masses CARDIAC:  RRR, no murmur heard, no peripheral edema PULM: Normal respiratory effort, lungs CTA bilaterally, no wheezing ABDOMEN:  soft, nontender, nondistended, no obvious masses, no hepatomegaly,  normal bowel sounds Rectal: no external lesions. No masses on rectal exam.  SKIN: Extensive vitiligo.   Musculoskeletal:  Normal muscle tone, normal strength NEURO: Alert and oriented x 3, no focal neurologic deficits  ASSESSMENT and PLAN:  1. Pleasant 26 year old female with acute on chronic diarrhea associated with lower cramping and rectal bleeding. Same symptoms a year ago at which time stool studies and CTscan negative. Bleeding maybe from internal hemorrhoids. No external lesions on exam. A few years ago she had chronic constipation Wonder about component of IBS especially since cramping is relieved with defecation.  - stool for C. difficile  - after submission of stool sample patient will try Lomotil twice a day as needed  - basic metabolic profile to check electrolytes today  -Trial of Bentyl as needed for cramping  -will call her in a few days with test results and to get a condition update.  2. Multiple medical problems: Graves disease , s/p thyroid ablation and hypothyroidism.  She  has interstitial cystitis followed by Dr. Logan BoresEvans. She has Sjogren's and Raynaud's. [facility]    Willette ClusterPaula Guenther , NP 03/08/2017, 9:43 AM

## 2017-03-12 ENCOUNTER — Other Ambulatory Visit: Payer: Medicaid Other

## 2017-03-12 DIAGNOSIS — K625 Hemorrhage of anus and rectum: Secondary | ICD-10-CM

## 2017-03-12 DIAGNOSIS — K529 Noninfective gastroenteritis and colitis, unspecified: Secondary | ICD-10-CM

## 2017-03-12 NOTE — Progress Notes (Signed)
I agree with the above note, plan 

## 2017-03-13 LAB — CLOSTRIDIUM DIFFICILE TOXIN: CDIFFPCR: NOT DETECTED

## 2017-04-25 MED FILL — SENSORCAINE-MPF 0.5 % SOLN: 0.5 | 10 days supply | Qty: 300 | Fill #1

## 2017-05-02 MED FILL — HEPARIN SOD 10,000 UNIT/ML: 10000 | 30 days supply | Qty: 120 | Fill #1

## 2017-05-02 MED FILL — BUPIVACAINE 0.5% VIAL: 0.5 | 15 days supply | Qty: 450 | Fill #2

## 2017-05-08 ENCOUNTER — Other Ambulatory Visit: Payer: Self-pay | Admitting: Physician Assistant

## 2017-05-08 DIAGNOSIS — N920 Excessive and frequent menstruation with regular cycle: Secondary | ICD-10-CM

## 2017-05-13 ENCOUNTER — Ambulatory Visit
Admission: RE | Admit: 2017-05-13 | Discharge: 2017-05-13 | Disposition: A | Discharge status: Home / Self Care | Payer: Medicaid Other | Source: Ambulatory Visit | Attending: Physician Assistant | Admitting: Physician Assistant

## 2017-05-13 DIAGNOSIS — N920 Excessive and frequent menstruation with regular cycle: Secondary | ICD-10-CM

## 2017-05-22 MED FILL — BUPIVACAINE 0.5% VIAL: 0.5 | 10 days supply | Qty: 300 | Fill #3

## 2017-06-12 MED FILL — BUPIVACAINE 0.5% VIAL: 0.5 | 10 days supply | Qty: 300 | Fill #4

## 2017-07-01 MED FILL — HEPARIN SOD 10,000 UNIT/ML: 10000 | 30 days supply | Qty: 120 | Fill #2

## 2017-07-01 MED FILL — BUPIVACAINE 0.5% VIAL: 0.5 | 10 days supply | Qty: 300 | Fill #5

## 2017-07-09 MED FILL — BUPIVACAINE 0.5% VIAL: 0.5 | 15 days supply | Qty: 450 | Fill #6

## 2017-07-10 ENCOUNTER — Telehealth: Payer: Self-pay | Admitting: Gastroenterology

## 2017-07-10 NOTE — Telephone Encounter (Signed)
Just spoke with [facility] at [facility].  She has significant bladder issues and is requiring a diversion.  She has short gut since an infant and so that precludes neobladder formation and instead would require small bowel conduit.  He estimates he would need about 10 cm of bowel to accomplish this.  I have not seen her in quite some time and he agrees there is no urgency or emergency for a bladder conduit and so I asked that she see me in the office first so that we can regroup and make sure that this would be safe for her given her GI history.  Patty,  can you contact her and get her in for Return office visit in the next 2 or 3 weeks, put her on wait list and double book if needed.  Thanks

## 2017-07-10 NOTE — Telephone Encounter (Signed)
appt for 07/24/17 at 930 am with Dr Christella HartiganJacobs pt has been advised of the appt

## 2017-07-22 MED FILL — SENSORCAINE-MPF 0.5 % SOLN: 0.5 | 15 days supply | Qty: 450 | Fill #7

## 2017-07-24 ENCOUNTER — Other Ambulatory Visit: Payer: Self-pay | Admitting: Gastroenterology

## 2017-07-24 ENCOUNTER — Encounter: Payer: Self-pay | Admitting: Gastroenterology

## 2017-07-24 ENCOUNTER — Ambulatory Visit: Payer: Medicaid Other | Admitting: Gastroenterology

## 2017-07-24 ENCOUNTER — Other Ambulatory Visit (INDEPENDENT_AMBULATORY_CARE_PROVIDER_SITE_OTHER): Payer: Medicaid Other

## 2017-07-24 VITALS — BP 112/80 | HR 80 | Ht 60.0 in | Wt 91.2 lb

## 2017-07-24 DIAGNOSIS — Z9049 Acquired absence of other specified parts of digestive tract: Secondary | ICD-10-CM | POA: Diagnosis not present

## 2017-07-24 DIAGNOSIS — D500 Iron deficiency anemia secondary to blood loss (chronic): Secondary | ICD-10-CM

## 2017-07-24 DIAGNOSIS — K589 Irritable bowel syndrome without diarrhea: Secondary | ICD-10-CM

## 2017-07-24 LAB — CBC WITH DIFFERENTIAL/PLATELET
BASOS ABS: 0 10*3/uL (ref 0.0–0.1)
Basophils Relative: 0.3 % (ref 0.0–3.0)
EOS PCT: 0.1 % (ref 0.0–5.0)
Eosinophils Absolute: 0 10*3/uL (ref 0.0–0.7)
HEMATOCRIT: 35.9 % — AB (ref 36.0–46.0)
HEMOGLOBIN: 12 g/dL (ref 12.0–15.0)
LYMPHS PCT: 26 % (ref 12.0–46.0)
Lymphs Abs: 1.4 10*3/uL (ref 0.7–4.0)
MCHC: 33.5 g/dL (ref 30.0–36.0)
MCV: 88.8 fl (ref 78.0–100.0)
MONOS PCT: 3 % (ref 3.0–12.0)
Monocytes Absolute: 0.2 10*3/uL (ref 0.1–1.0)
Neutro Abs: 3.7 10*3/uL (ref 1.4–7.7)
Neutrophils Relative %: 70.6 % (ref 43.0–77.0)
Platelets: 274 10*3/uL (ref 150.0–400.0)
RBC: 4.04 Mil/uL (ref 3.87–5.11)
RDW: 14.2 % (ref 11.5–15.5)
WBC: 5.2 10*3/uL (ref 4.0–10.5)

## 2017-07-24 LAB — IBC PANEL
Iron: 19 ug/dL — ABNORMAL LOW (ref 42–145)
Saturation Ratios: 4.2 % — ABNORMAL LOW (ref 20.0–50.0)
Transferrin: 321 mg/dL (ref 212.0–360.0)

## 2017-07-24 LAB — COMPREHENSIVE METABOLIC PANEL
ALBUMIN: 3.9 g/dL (ref 3.5–5.2)
ALK PHOS: 57 U/L (ref 39–117)
ALT: 18 U/L (ref 0–35)
AST: 19 U/L (ref 0–37)
BILIRUBIN TOTAL: 1.8 mg/dL — AB (ref 0.2–1.2)
BUN: 11 mg/dL (ref 6–23)
CALCIUM: 9.3 mg/dL (ref 8.4–10.5)
CO2: 17 mEq/L — ABNORMAL LOW (ref 19–32)
Chloride: 109 mEq/L (ref 96–112)
Creatinine, Ser: 0.76 mg/dL (ref 0.40–1.20)
GFR: 115.78 mL/min (ref >60.00)
Glucose, Bld: 139 mg/dL — ABNORMAL HIGH (ref 70–99)
POTASSIUM: 3.4 meq/L — AB (ref 3.5–5.1)
Sodium: 139 mEq/L (ref 135–145)
TOTAL PROTEIN: 7.5 g/dL (ref 6.0–8.3)

## 2017-07-24 LAB — FERRITIN: Ferritin: 4.9 ng/mL — ABNORMAL LOW (ref 10.0–291.0)

## 2017-07-24 LAB — VITAMIN B12: Vitamin B-12: 389 pg/mL (ref 211–911)

## 2017-07-24 LAB — VITAMIN D 25 HYDROXY (VIT D DEFICIENCY, FRACTURES): VITD: 21.06 ng/mL — ABNORMAL LOW (ref 30.00–100.00)

## 2017-07-24 LAB — ALBUMIN: Albumin: 3.9 g/dL (ref 3.5–5.2)

## 2017-07-24 NOTE — Patient Instructions (Addendum)
UGI with small bowel follow through: "please estimate the length of her remaining small bowel"  You have been scheduled for an Upper GI Series and Small Bowel Follow Thru at [facility]. Your appointment is on 08/01/17 at 1030 am. Please arrive 15 minutes prior to your test for registration. Make certain not to have anything to eat or drink after midnight on the night before your test. If you need to reschedule, please contact radiology at [phone]. ------------------------------------------------------------------------------------------------------ An upper GI series uses x rays to help diagnose problems of the upper GI tract, which includes the esophagus, stomach, and duodenum. The duodenum is the first part of the small intestine. An upper GI series is conducted by a radiology technologist or a radiologist-a doctor who specializes in x-ray imaging-at a hospital or outpatient center. While sitting or standing in front of an x-ray machine, the patient drinks barium liquid, which is often white and has a chalky consistency and taste. The barium liquid coats the lining of the upper GI tract and makes signs of disease show up more clearly on x rays. X-ray video, called fluoroscopy, is used to view the barium liquid moving through the esophagus, stomach, and duodenum. Additional x rays and fluoroscopy are performed while the patient lies on an x-ray table. To fully coat the upper GI tract with barium liquid, the technologist or radiologist may press on the abdomen or ask the patient to change position. Patients hold still in various positions, allowing the technologist or radiologist to take x rays of the upper GI tract at different angles. If a technologist conducts the upper GI series, a radiologist will later examine the images to look for problems.  This test typically takes about 1 hour to  complete ------------------------------------------------------------------------------------------------------ The Small Bowel Follow Thru examination is used to visualize the entire small bowel (intestines); specifically the connection between the small and large intestine. You will be positioned on a flat x-ray table and an image of your abdomen taken. Then the technologist will show the x-ray to the radiologist. The radiologist will instruct your technologist how much (1-2 cups) barium sulfate you will drink and when to begin taking the timed x-rays, usually 15-30 minutes after you begin drinking. Barium is a harmless substance that will highlight your small intestine by absorbing x-ray. The taste is chalky and it feels very heavy both in the cup and in your stomach.  After the first x-ray is taken and shown to the radiologist, he/she will determine when the next image is to be taken. This is repeated until the barium has reached the end of the small intestine and enters the beginning of the colon (cecum). At such time when the barium spills into the colon, you will be positioned on the x-ray table once again. The radiologist will use a fluoroscopic camera to take some detailed pictures of the connection between your small intestine and colon. The fluoroscope is an x-ray unit that works with a television/computer screen. The radiologist will apply pressure to your abdomen with his/her hand and a lead glove, a plastic paddle, or a paddle with an inflated rubber balloon on the end. This is to spread apart your loops of intestine so he/she can see all areas.   This test typically takes around 1 hour to complete.  Important Drink plenty of water (8-10 cups/day) for a few days following the procedure to avoid constipation and blockage. The barium will make your stools white for a few days. ------------------------------------------------------------------------------------------------------  You will have  labs checked today in the basement lab.  Please head down after you check out with the front desk.     You have been scheduled for a bone density test on 07/26/17 at 9 am. Please arrive 15 minutes prior to your scheduled appointment to radiology on the basement floor of [facility] location for this test. If you need to cancel or reschedule for any reason, please contact radiology at [phone].  Preparation for test is as follows:   If you are taking calcium, discontinue this 24-48 hours prior to your appointment.   Wear pants with an elastic waistband (or without any metal such as a zipper).   Do not wear an underwire bra.   We do have gowns if you are unable to find appropriate clothing without metal.   Please bring a list of all current medications.

## 2017-07-24 NOTE — Progress Notes (Signed)
Review of pertinent gastrointestinal problems: 1. Necrotizing enterocolitis as infant, s/p major abd surgery, bowel resection.  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  On TPN until age 89 or 3 2. Constipation: large amount of stool in dilated colon 03/2013;  Colonoscopy 07/2013: tortuous colon, IC anastomosis was normal.  Cholestyramine trial 2015 helped. 3. Diarrhea workup 2017: stool path panel negative.   HPI: This is a very pleasant 27 year old woman whom I last saw about 2 or 3 years ago.  Her urologist Dr. Logan BoresEvans recently contacted me about her chronic cystitis.  He is planning to remove her urinary bladder and divert the urine stream.  He will need about 10 cm of small bowel to do this and since he knows that she has short gut syndrome he was wondering if even that short length is safe for her.  She does seem to be pretty debilitated from urinary symptoms.  She urinates multiple times at night, there is always overt blood in her urine.  She is also has significant pelvic pain that may or may not be from her chronic cystitis.  Terms of her short gut syndrome she was on TPN until she was around age 89 or 89.  Since then she has not required IV nutrition but she does struggle to keep her weight up.  She has a low BMI.  Her weight fluctuates but she is never over about 90 pounds or so.  She has intermittent constipation, intermittent diarrhea.  Tends to be diarrhea predominance.   Chief complaint is Short gut syndrome  ROS: complete GI ROS as described in HPI, all other review negative.  Constitutional:  No unintentional weight loss   Past Medical History:  Diagnosis Date  . Bladder mass   . Chronic interstitial cystitis with hematuria    2016  . Fibromyalgia   . GERD (gastroesophageal reflux disease)   . Gross hematuria   . History of Graves' disease    tx done in 2007  . History of migraine   . History of recurrent UTIs   . Hypothyroidism   . Iron deficiency anemia    due to SGS  . Lower urinary tract symptoms (LUTS)   . Lupus    rheumologist--  dr Janalyn Rouseshaili devashwer  . Malabsorption syndrome   . Passage of loose stools    chronic --  due to short gut syndrome  . Pleuritis   . S/P radioactive iodine thyroid ablation    2007  . SGS (short gut syndrome)   . Sjogren's disease (HCC)   . Vitiligo   . Wears glasses     Past Surgical History:  Procedure Laterality Date  . BOWEL RESECTION  newborn   necrotizing enterocolitis (liver patched)  . BRONCHOSCOPY  2014  . CESAREAN SECTION  01/02/2012   Procedure: CESAREAN SECTION;  Surgeon: Lenoard Adenichard J Taavon, MD;  Location: [facility];  Service: Gynecology;  Laterality: N/A;  . COLONOSCOPY  07-29-2013  . CYSTOSCOPY WITH BIOPSY N/A 11/23/2014   Procedure: CYSTOSCOPY WITH BLADDER  BIOPSY AND FULGERATION;  Surgeon: Bjorn PippinJohn Wrenn, MD;  [facility];  Service: Urology;  Laterality: N/A;  . KNEE ARTHROSCOPY Right 2008    Current Outpatient Medications  Medication Sig Dispense Refill  . AMBULATORY NON FORMULARY MEDICATION Iron Inf[facility] every [facility]- [facility]    . AMBULATORY NON FORMULARY MEDICATION Bladder installation daily:  Heparin 10,000 units and bupivazaine 0.5%    . cholestyramine (  QUESTRAN) 4 g packet Take 1 packet (4 g total) by mouth 2 (two) times daily. 60 each 6  . diazepam (DIASTAT ACUDIAL) 10 MG GEL Place 10 mg rectally at bedtime.    . dicyclomine (BENTYL) 10 MG capsule Take 1 capsule (10 mg total) by mouth 2 (two) times daily. 60 capsule 1  . diphenoxylate-atropine (LOMOTIL) 2.5-0.025 MG tablet One tablet twice a day 60 tablet 0  . hydroxychloroquine (PLAQUENIL) 200 MG tablet Take 400 mg by mouth every morning.     . hyoscyamine (LEVBID) 0.375 MG 12 hr tablet Take 1 tab 3 times daily as needed for cramping and spasms. 90 tablet 6  . levothyroxine (SYNTHROID) 100 MCG tablet Take 1 tablet by mouth daily.    . meloxicam (MOBIC) 15 MG tablet  Take 15 mg by mouth daily.    . nitrofurantoin, macrocrystal-monohydrate, (MACROBID) 100 MG capsule Take 1 capsule (100 mg total) by mouth 2 (two) times daily. Take one capsule BID x 10 days 20 capsule 0  . NONFORMULARY OR COMPOUNDED ITEM Lidocaine 5% cream in a neutral base. Apply a pea sized amount topically up to 6 x a day. 30 grams, no refills. 1 each 0  . OMEPRAZOLE PO Take 20 mg by mouth at bedtime.     . pilocarpine (SALAGEN) 5 MG tablet Take 1 tablet (5 mg total) by mouth 2 (two) times daily. 60 tablet 0  . QUEtiapine (SEROQUEL) 300 MG tablet Take 300 mg by mouth at bedtime.    Marland Kitchen tiZANidine (ZANAFLEX) 4 MG tablet Take 4 mg by mouth every 8 (eight) hours as needed for muscle spasms.     . traMADol (ULTRAM) 50 MG tablet Take 50 mg by mouth every 6 (six) hours as needed.     No current facility-administered medications for this visit.     Allergies as of 07/24/2017 - Review Complete 07/24/2017  Allergen Reaction Noted  . Penicillins Anaphylaxis 09/06/2011  . Morphine and related Itching 09/06/2011    Family History  Problem Relation Age of Onset  . Diabetes Father   . Endometriosis Mother   . Heart disease Maternal Grandmother   . Hypertension Maternal Grandmother   . Osteoporosis Maternal Grandmother   . Heart disease Maternal Grandfather   . Hypertension Maternal Grandfather   . Heart disease Paternal Grandmother   . Other Neg Hx   . Colon cancer Neg Hx   . Pancreatic cancer Neg Hx   . Stomach cancer Neg Hx     Social History   Socioeconomic History  . Marital status: Divorced    Spouse name: Not on file  . Number of children: 1  . Years of education: Not on file  . Highest education level: Not on file  Social Needs  . Financial resource strain: Not on file  . Food insecurity - worry: Not on file  . Food insecurity - inability: Not on file  . Transportation needs - medical: Not on file  . Transportation needs - non-medical: Not on file  Occupational History  .  Occupation: Consulting civil engineer  Tobacco Use  . Smoking status: Never Smoker  . Smokeless tobacco: Never Used  Substance and Sexual Activity  . Alcohol use: No    Alcohol/week: 0.0 oz  . Drug use: No  . Sexual activity: Not on file  Other Topics Concern  . Not on file  Social History Narrative  . Not on file     Physical Exam: BP 112/80   Pulse 80  Ht 5' (1.524 m)   Wt 91 lb 3.2 oz (41.4 kg)   LMP 06/24/2017   BMI 17.81 kg/m  Constitutional: generally well-appearing Psychiatric: alert and oriented x3 Abdomen: soft, nontender, nondistended, no obvious ascites, no peritoneal signs, normal bowel sounds No peripheral edema noted in lower extremities  Assessment and plan: 27 y.o. female with history of short gut syndrome, chronic urinary cystitis  I am going to check to see how well her small bowel is digesting, absorbing nutrients with a battery of blood tests, DEXA scan.  See the blood test summarized in patient instructions.  She will also have an upper GI with small bowel follow-through to estimate the overall length of remaining small bowel.  After that workup I will get back in touch with Dr. Logan Bores about  final recommendations.  Please see the "Patient Instructions" section for addition details about the plan.  Rob Bunting, MD [facility] 07/24/2017, 10:11 AM

## 2017-07-26 ENCOUNTER — Ambulatory Visit (INDEPENDENT_AMBULATORY_CARE_PROVIDER_SITE_OTHER)
Admission: RE | Admit: 2017-07-26 | Discharge: 2017-07-26 | Disposition: A | Discharge status: Home / Self Care | Payer: Medicaid Other | Source: Ambulatory Visit | Attending: Gastroenterology | Admitting: Gastroenterology

## 2017-07-26 DIAGNOSIS — Z9049 Acquired absence of other specified parts of digestive tract: Secondary | ICD-10-CM | POA: Diagnosis not present

## 2017-07-26 DIAGNOSIS — D500 Iron deficiency anemia secondary to blood loss (chronic): Secondary | ICD-10-CM | POA: Diagnosis not present

## 2017-07-26 DIAGNOSIS — K589 Irritable bowel syndrome without diarrhea: Secondary | ICD-10-CM | POA: Diagnosis not present

## 2017-07-28 LAB — CERULOPLASMIN: CERULOPLASMIN: 41 mg/dL (ref 18–53)

## 2017-07-28 LAB — VITAMIN A: Vitamin A (Retinoic Acid): 40 ug/dL (ref 38–98)

## 2017-07-28 LAB — VITAMIN E
Gamma-Tocopherol (Vit E): 1.5 mg/L (ref <=4.3)
Vitamin E (Alpha Tocopherol): 13.4 mg/L (ref 5.7–19.9)

## 2017-07-28 LAB — ZINC: ZINC: 54 ug/dL — AB (ref 60–130)

## 2017-07-28 LAB — SELENIUM SERUM: SELENIUM, BLOOD: 211 ug/L — AB (ref 63–160)

## 2017-08-01 ENCOUNTER — Ambulatory Visit (HOSPITAL_COMMUNITY)
Admission: RE | Admit: 2017-08-01 | Discharge: 2017-08-01 | Disposition: A | Discharge status: Home / Self Care | Payer: Medicaid Other | Source: Ambulatory Visit | Attending: Gastroenterology | Admitting: Gastroenterology

## 2017-08-01 DIAGNOSIS — D500 Iron deficiency anemia secondary to blood loss (chronic): Secondary | ICD-10-CM | POA: Insufficient documentation

## 2017-08-01 DIAGNOSIS — K589 Irritable bowel syndrome without diarrhea: Secondary | ICD-10-CM | POA: Diagnosis present

## 2017-08-01 DIAGNOSIS — Z9049 Acquired absence of other specified parts of digestive tract: Secondary | ICD-10-CM | POA: Insufficient documentation

## 2017-08-10 ENCOUNTER — Encounter (HOSPITAL_BASED_OUTPATIENT_CLINIC_OR_DEPARTMENT_OTHER): Payer: Self-pay | Admitting: *Deleted

## 2017-08-10 ENCOUNTER — Other Ambulatory Visit: Payer: Self-pay

## 2017-08-10 ENCOUNTER — Emergency Department (HOSPITAL_BASED_OUTPATIENT_CLINIC_OR_DEPARTMENT_OTHER)
Admission: EM | Admit: 2017-08-10 | Discharge: 2017-08-10 | Disposition: A | Discharge status: Home / Self Care | Payer: Medicaid Other | Attending: Physician Assistant | Admitting: Physician Assistant

## 2017-08-10 DIAGNOSIS — Z79899 Other long term (current) drug therapy: Secondary | ICD-10-CM | POA: Diagnosis not present

## 2017-08-10 DIAGNOSIS — Y939 Activity, unspecified: Secondary | ICD-10-CM | POA: Diagnosis not present

## 2017-08-10 DIAGNOSIS — Y998 Other external cause status: Secondary | ICD-10-CM | POA: Diagnosis not present

## 2017-08-10 DIAGNOSIS — Y92410 Unspecified street and highway as the place of occurrence of the external cause: Secondary | ICD-10-CM | POA: Diagnosis not present

## 2017-08-10 DIAGNOSIS — M545 Low back pain, unspecified: Secondary | ICD-10-CM

## 2017-08-10 MED ORDER — CYCLOBENZAPRINE HCL 10 MG PO TABS: 10.0000 mg | ORAL_TABLET | Freq: Two times a day (BID) | ORAL | 0 refills | Status: DC | PRN
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
## 2017-08-10 NOTE — Discharge Instructions (Signed)
Please read instructions below.  Apply ice to your back for 20 minutes at a time. Drink plenty of water. You can take advil/ibuprofen every 6 hours as needed for pain. You can take 1/2 to 1 tab of flexeril as needed for muscle spasm. Be aware this medication can make you drowsy. Schedule an appointment with you primary care provider to follow up if symptoms persist.  Return to ER if new numbness or tingling in your arms or legs, inability to urinate, inability to hold your bowels, or weakness in your extremities.

## 2017-08-20 MED FILL — BUPIVACAINE 0.5% VIAL: 0.5 | 15 days supply | Qty: 450 | Fill #8

## 2017-08-20 MED FILL — BD NEEDLES 18GX1.5": 18G X 1-1/2 | 30 days supply | Qty: 30 | Fill #0

## 2017-08-20 MED FILL — HEPARIN SOD 10,000 UNIT/ML: 10000 | 30 days supply | Qty: 120 | Fill #3

## 2017-08-20 MED FILL — BD NEEDLES 18GX1.5: 18G X 1-1/2 | 30 days supply | Qty: 30 | Fill #0

## 2017-08-20 MED FILL — MONOJECT DISP SYRINGE 20 ML: 20 ML | 30 days supply | Qty: 30 | Fill #0

## 2017-09-02 MED FILL — SENSORCAINE-MPF 0.5 % SOLN: 0.5 | 15 days supply | Qty: 450 | Fill #9

## 2017-09-11 MED FILL — SYNTHROID 175 MCG TABLET: 175 | 30 days supply | Qty: 30 | Fill #0

## 2017-09-16 MED FILL — SENSORCAINE-MPF 0.5 % SOLN: 0.5 | 15 days supply | Qty: 450 | Fill #10

## 2017-09-17 MED FILL — CYPROHEPTADINE 4 MG TABLET: 4 | 30 days supply | Qty: 120 | Fill #0

## 2017-09-27 MED FILL — SENSORCAINE-MPF 0.5 % SOLN: 0.5 | 15 days supply | Qty: 450 | Fill #11

## 2017-10-03 MED FILL — SULFAMETHOXAZOLE-TMP DS TAB: 800-160 | 7 days supply | Qty: 14 | Fill #0

## 2017-10-03 MED FILL — HEPARIN SODIUM (PORCINE) 10: 10000 | 30 days supply | Qty: 240 | Fill #0

## 2017-10-07 MED FILL — FLUCONAZOLE 150 MG TABLET: 150 | 1 days supply | Qty: 1 | Fill #0

## 2017-10-08 MED FILL — SENSORCAINE-MPF 0.5 % SOLN: 0.5 | 30 days supply | Qty: 900 | Fill #0

## 2017-10-15 MED FILL — SYNTHROID 175 MCG TABLET: 175 | 30 days supply | Qty: 30 | Fill #1

## 2017-10-28 MED FILL — ONDANSETRON ODT 8 MG TABLET: 8 | 7 days supply | Qty: 20 | Fill #0

## 2017-10-29 ENCOUNTER — Other Ambulatory Visit: Payer: Self-pay | Admitting: Physician Assistant

## 2017-10-29 DIAGNOSIS — R1030 Lower abdominal pain, unspecified: Secondary | ICD-10-CM

## 2017-10-30 ENCOUNTER — Telehealth: Payer: Self-pay | Admitting: Hematology and Oncology

## 2017-10-30 ENCOUNTER — Inpatient Hospital Stay: Payer: Medicaid Other | Attending: Hematology and Oncology | Admitting: Hematology and Oncology

## 2017-10-30 VITALS — BP 105/79 | HR 96 | Temp 98.6°F | Resp 20 | Ht 60.0 in | Wt 96.5 lb

## 2017-10-30 DIAGNOSIS — D500 Iron deficiency anemia secondary to blood loss (chronic): Secondary | ICD-10-CM | POA: Insufficient documentation

## 2017-10-30 DIAGNOSIS — Z79899 Other long term (current) drug therapy: Secondary | ICD-10-CM | POA: Diagnosis not present

## 2017-10-30 DIAGNOSIS — R51 Headache: Secondary | ICD-10-CM | POA: Diagnosis not present

## 2017-10-30 DIAGNOSIS — R5383 Other fatigue: Secondary | ICD-10-CM | POA: Diagnosis not present

## 2017-10-30 HISTORY — PX: CYSTECTOMY W/ URETEROILEAL CONDUIT: SUR361

## 2017-10-30 NOTE — Assessment & Plan Note (Signed)
Patient has had heavy menstrual bleeding accompanied by urinary tract bleeding from chronic interstitial cystitis. She has been on oral iron therapy since 2012 although at times she was taken off iron supplementation. Since February she has been anemic with a hemoglobin of 10.6-10.9. In spite of being on oral iron treatment her hemoglobin has remained low. She continuedto have symptoms related to iron deficiency with fatigue, ice craving, headaches etc. all of these symptoms improved with IV iron treatment given July 2017  Treatment summary: IV iron given July 2017 Lab review: April 2019: Ferritin less than 4, profound iron deficiency anemia Recommendation: Intravenous iron therapy 2 doses 1 week apart  Follow-up in 6 months with labs done ahead of time

## 2017-10-30 NOTE — Telephone Encounter (Signed)
Gave patient AVs and calendar of upcoming may and November appointments.

## 2017-10-30 NOTE — Progress Notes (Signed)
Patient Care Team: Omer Jack as PCP - General (Physician Assistant)  DIAGNOSIS:  Encounter Diagnosis  Name Primary?  . Iron deficiency anemia due to chronic blood loss Yes    CHIEF COMPLIANT: Follow-up of recently worsened iron deficiency anemia  INTERVAL HISTORY: [name] is a 27 year old with a prior history of heavy menstrual bleeding as well as a urinary bleeding from interstitial cystitis who received intravenous iron therapy in July 2017 and has not required any iron since then.  Recently she was noted to have profound iron deficiency and she was referred back to [place] for discussion regarding intravenous iron replacement therapy.  In January she was found to be iron deficient but it only got worse over the past 3 months.  She has already started having ice cravings and fatigue and headaches.  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  REVIEW OF SYSTEMS:   Constitutional: Fatigue denies cravings Eyes: Denies blurriness of vision Ears, nose, mouth, throat, and face: Denies mucositis or sore throat Respiratory: Denies cough, dyspnea or wheezes Cardiovascular: Denies palpitation, chest discomfort Gastrointestinal:  Denies nausea, heartburn or change in bowel habits Skin: Denies abnormal skin rashes Lymphatics: Denies new lymphadenopathy or easy bruising Neurological:Denies numbness, tingling or new weaknesses Behavioral/Psych: Mood is stable, no new changes  Extremities: No lower extremity edema  All other systems were reviewed with the patient and are negative.  I have reviewed the past medical history, past surgical history, social history and family history with the patient and they are unchanged from previous note.  ALLERGIES:  is allergic to penicillins and morphine and related.  MEDICATIONS:  Current Outpatient Medications  Medication Sig Dispense Refill  . AMBULATORY NON  FORMULARY MEDICATION Iron Infusions every 3 months done at [facility]- ordered thru [facility]    . AMBULATORY NON FORMULARY MEDICATION Bladder installation daily:  Heparin 10,000 units and bupivazaine 0.5%    . cholestyramine (QUESTRAN) 4 g packet Take 1 packet (4 g total) by mouth 2 (two) times daily. 60 each 6  . cyclobenzaprine (FLEXERIL) 10 MG tablet Take 1 tablet (10 mg total) by mouth 2 (two) times daily as needed for muscle spasms. 14 tablet 0  . diazepam (DIASTAT ACUDIAL) 10 MG GEL Place 10 mg rectally at bedtime.    . dicyclomine (BENTYL) 10 MG capsule Take 1 capsule (10 mg total) by mouth 2 (two) times daily. 60 capsule 1  . diphenoxylate-atropine (LOMOTIL) 2.5-0.025 MG tablet One tablet twice a day 60 tablet 0  . hydroxychloroquine (PLAQUENIL) 200 MG tablet Take 400 mg by mouth every morning.     . hyoscyamine (LEVBID) 0.375 MG 12 hr tablet Take 1 tab 3 times daily as needed for cramping and spasms. 90 tablet 6  . levothyroxine (SYNTHROID) 100 MCG tablet Take 1 tablet by mouth daily.    . meloxicam (MOBIC) 15 MG tablet Take 15 mg by mouth daily.    . nitrofurantoin, macrocrystal-monohydrate, (MACROBID) 100 MG capsule Take 1 capsule (100 mg total) by mouth 2 (two) times daily. Take one capsule BID x 10 days 20 capsule 0  . NONFORMULARY OR COMPOUNDED ITEM Lidocaine 5% cream in a neutral base. Apply a pea sized amount topically up to 6 x a day. 30 grams, no refills. 1 each 0  . OMEPRAZOLE PO Take 20 mg by mouth at bedtime.     . pilocarpine (SALAGEN) 5 MG tablet Take 1 tablet (  5 mg total) by mouth 2 (two) times daily. 60 tablet 0  . QUEtiapine (SEROQUEL) 300 MG tablet Take 300 mg by mouth at bedtime.    Marland Kitchen tiZANidine (ZANAFLEX) 4 MG tablet Take 4 mg by mouth every 8 (eight) hours as needed for muscle spasms.     . traMADol (ULTRAM) 50 MG tablet Take 50 mg by mouth every 6 (six) hours as needed.     No current facility-administered medications for this visit.     PHYSICAL  EXAMINATION: ECOG PERFORMANCE STATUS: 1 - Symptomatic but completely ambulatory  Vitals:   10/30/17 1035  BP: 105/79  Pulse: 96  Resp: 20  Temp: 98.6 F (37 C)  SpO2: 100%   Filed Weights   10/30/17 1035  Weight: 96 lb 8 oz (43.8 kg)    GENERAL:alert, no distress and comfortable SKIN: Vitiligo EYES: normal, Conjunctiva are pink and non-injected, sclera clear OROPHARYNX:no exudate, no erythema and lips, buccal mucosa, and tongue normal  NECK: supple, thyroid normal size, non-tender, without nodularity LYMPH:  no palpable lymphadenopathy in the cervical, axillary or inguinal LUNGS: clear to auscultation and percussion with normal breathing effort HEART: regular rate & rhythm and no murmurs and no lower extremity edema ABDOMEN:abdomen soft, non-tender and normal bowel sounds MUSCULOSKELETAL:no cyanosis of digits and no clubbing  NEURO: alert & oriented x 3 with fluent speech, no focal motor/sensory deficits EXTREMITIES: No lower extremity edema LABORATORY DATA:  I have reviewed the data as listed CMP Latest Ref Rng & Units 07/24/2017 03/08/2017 03/07/2016  Glucose 70 - 99 mg/dL 409(W) 74 88  BUN 6 - 23 mg/dL Creatinine 0.40 - 1.20 mg/dL 1.19 1.47 8.29  Sodium 135 - 145 mEq/L 139 137 139  Potassium 3.5 - 5.1 mEq/L 3.4(L) 3.5 3.7  Chloride 96 - 112 mEq/L 109 105 107  CO2 19 - 32 mEq/L 17(L) 24 28  Calcium 8.4 - 10.5 mg/dL 9.3 9.8 9.0  Total Protein 6.0 - 8.3 g/dL 7.5 - -  Total Bilirubin 0.2 - 1.2 mg/dL 5.6(O) - -  Alkaline Phos 39 - 117 U/L 57 - -  AST 0 - 37 U/L 19 - -  ALT 0 - 35 U/L 18 - -    Lab Results  Component Value Date   WBC 5.2 07/24/2017   HGB 12.0 07/24/2017   HCT 35.9 (L) 07/24/2017   MCV 88.8 07/24/2017   PLT 274.0 07/24/2017   NEUTROABS 3.7 07/24/2017    ASSESSMENT & PLAN:  Iron deficiency anemia due to chronic blood loss Patient has had heavy menstrual bleeding accompanied by urinary tract bleeding from chronic interstitial cystitis. She has  been on oral iron therapy since 2012 although at times she was taken off iron supplementation. Since February she has been anemic with a hemoglobin of 10.6-10.9. In spite of being on oral iron treatment her hemoglobin has remained low. She continuedto have symptoms related to iron deficiency with fatigue, ice craving, headaches etc. all of these symptoms improved with IV iron treatment given July 2017  Treatment summary: IV iron given July 2017 Lab review: April 2019: Ferritin less than 4, profound iron deficiency anemia Recommendation: Intravenous iron therapy 2 doses 1 week apart  Follow-up in 6 months with labs done ahead of time   Orders Placed This Encounter  Procedures  . Ferritin    Standing Status:   Future    Standing Expiration Date:   10/30/2018  . Iron and TIBC    Standing Status:  Future    Standing Expiration Date:   10/30/2018  . CBC with Differential (Cancer Center Only)    Standing Status:   Future    Standing Expiration Date:   10/31/2018   The patient has a good understanding of the overall plan. she agrees with it. she will call with any problems that may develop before the next visit here.   Tamsen Meek, MD 10/30/17

## 2017-11-01 ENCOUNTER — Ambulatory Visit (HOSPITAL_COMMUNITY)
Admission: RE | Admit: 2017-11-01 | Discharge: 2017-11-01 | Disposition: A | Discharge status: Home / Self Care | Payer: Medicaid Other | Source: Ambulatory Visit | Attending: Hematology and Oncology | Admitting: Hematology and Oncology

## 2017-11-01 ENCOUNTER — Ambulatory Visit
Admission: RE | Admit: 2017-11-01 | Discharge: 2017-11-01 | Disposition: A | Discharge status: Home / Self Care | Payer: Medicaid Other | Source: Ambulatory Visit | Attending: Physician Assistant | Admitting: Physician Assistant

## 2017-11-01 DIAGNOSIS — D509 Iron deficiency anemia, unspecified: Secondary | ICD-10-CM | POA: Diagnosis present

## 2017-11-01 DIAGNOSIS — R1030 Lower abdominal pain, unspecified: Secondary | ICD-10-CM

## 2017-11-01 MED ORDER — DIPHENHYDRAMINE HCL 50 MG/ML IJ SOLN
25.0000 mg | Freq: Once | INTRAMUSCULAR | Status: AC
  Administered 2017-11-01: 25 mg via INTRAVENOUS
  Filled 2017-11-01: qty 1

## 2017-11-01 MED ORDER — SODIUM CHLORIDE 0.9 % IV SOLN
510.0000 mg | Freq: Once | INTRAVENOUS | Status: AC
  Administered 2017-11-01: 510 mg via INTRAVENOUS
  Filled 2017-11-01: qty 17

## 2017-11-01 MED ORDER — SODIUM CHLORIDE 0.9 % IV SOLN
Freq: Once | INTRAVENOUS | Status: AC
Start: 2017-11-01 — End: 2017-11-01
  Administered 2017-11-01: 12:00:00 via INTRAVENOUS

## 2017-11-01 MED FILL — DOXYCYCLINE HYCLATE 100 MG: 100 | 10 days supply | Qty: 20 | Fill #0

## 2017-11-01 NOTE — Discharge Instructions (Signed)

## 2017-11-01 NOTE — Progress Notes (Signed)
PATIENT CARE CENTER NOTE  Diagnosis: Iron Deficiency Anemia    Provider: Dr. Pamelia Hoit    Procedure: IV Feraheme    Note: Patient received infusion of IV Feraheme. Patient pre-medicated with 25 mg IV Benadryl due to previous reaction with Feraheme. Infusion tolerated well with no adverse reaction. Patient monitored for 30 minutes after infusion and vital signs taken. Patient alert, oriented and ambulatory at discharge.

## 2017-11-06 MED FILL — HEPARIN SODIUM (PORCINE) 10: 10000 | 30 days supply | Qty: 240 | Fill #1

## 2017-11-06 MED FILL — BUPIVACAINE 0.5% VIAL: 0.5 | 30 days supply | Qty: 900 | Fill #1

## 2017-11-08 ENCOUNTER — Ambulatory Visit (HOSPITAL_COMMUNITY)
Admission: RE | Admit: 2017-11-08 | Discharge: 2017-11-08 | Disposition: A | Discharge status: Home / Self Care | Payer: Medicaid Other | Source: Ambulatory Visit | Attending: Hematology and Oncology | Admitting: Hematology and Oncology

## 2017-11-08 DIAGNOSIS — D509 Iron deficiency anemia, unspecified: Secondary | ICD-10-CM | POA: Diagnosis not present

## 2017-11-08 MED ORDER — SODIUM CHLORIDE 0.9 % IV SOLN
510.0000 mg | Freq: Once | INTRAVENOUS | Status: AC
  Administered 2017-11-08: 510 mg via INTRAVENOUS
  Filled 2017-11-08: qty 17

## 2017-11-08 MED ORDER — DIPHENHYDRAMINE HCL 50 MG/ML IJ SOLN
25.0000 mg | Freq: Once | INTRAMUSCULAR | Status: AC
  Administered 2017-11-08: 25 mg via INTRAVENOUS
  Filled 2017-11-08: qty 1

## 2017-11-08 MED ORDER — SODIUM CHLORIDE 0.9 % IV SOLN
INTRAVENOUS | Status: DC
  Administered 2017-11-08: 11:00:00 via INTRAVENOUS

## 2017-11-08 NOTE — Progress Notes (Signed)
Pt arrived and received IV infusion of feraheme per previous electronic order; IV benadryl administered prior to infusion;  No complications noted  Ordering Provider: Dorann Lodge. Assoicated Diagnosis: Iron deficiency anemia due to chronic blood loss [D50.0]

## 2017-11-08 NOTE — Discharge Instructions (Signed)

## 2017-12-06 DIAGNOSIS — Z936 Other artificial openings of urinary tract status: Secondary | ICD-10-CM | POA: Insufficient documentation

## 2017-12-06 MED FILL — SYNTHROID 175 MCG TABLET: 175 | 30 days supply | Qty: 30 | Fill #2

## 2017-12-06 MED FILL — SULFAMETHOXAZOLE-TMP DS TAB: 800-160 | 7 days supply | Qty: 14 | Fill #0

## 2018-01-10 MED FILL — SYNTHROID 175 MCG TABLET: 175 | 30 days supply | Qty: 30 | Fill #3

## 2018-02-03 MED FILL — predniSONE 5 MG TABS: 5 | 9 days supply | Qty: 18 | Fill #0

## 2018-02-14 MED FILL — HYDROXYCHLOROQUINE SULFATE: 200 | 30 days supply | Qty: 30 | Fill #0

## 2018-02-18 MED FILL — SYNTHROID 175 MCG TABLET: 175 | 30 days supply | Qty: 30 | Fill #4

## 2018-02-26 DIAGNOSIS — N926 Irregular menstruation, unspecified: Secondary | ICD-10-CM | POA: Insufficient documentation

## 2018-03-14 ENCOUNTER — Other Ambulatory Visit: Payer: Self-pay

## 2018-03-14 ENCOUNTER — Encounter (HOSPITAL_BASED_OUTPATIENT_CLINIC_OR_DEPARTMENT_OTHER): Payer: Self-pay

## 2018-03-24 ENCOUNTER — Ambulatory Visit (HOSPITAL_BASED_OUTPATIENT_CLINIC_OR_DEPARTMENT_OTHER): Payer: Medicaid Other | Admitting: Anesthesiology

## 2018-03-24 ENCOUNTER — Ambulatory Visit (HOSPITAL_BASED_OUTPATIENT_CLINIC_OR_DEPARTMENT_OTHER)
Admission: RE | Admit: 2018-03-24 | Discharge: 2018-03-24 | Disposition: A | Discharge status: Home / Self Care | Payer: Medicaid Other | Source: Ambulatory Visit | Attending: Oral Surgery | Admitting: Oral Surgery

## 2018-03-24 ENCOUNTER — Encounter (HOSPITAL_BASED_OUTPATIENT_CLINIC_OR_DEPARTMENT_OTHER): Payer: Self-pay | Admitting: *Deleted

## 2018-03-24 ENCOUNTER — Encounter (HOSPITAL_BASED_OUTPATIENT_CLINIC_OR_DEPARTMENT_OTHER)
Admission: RE | Disposition: A | Discharge status: Home / Self Care | Payer: Self-pay | Source: Ambulatory Visit | Attending: Oral Surgery

## 2018-03-24 DIAGNOSIS — Z79899 Other long term (current) drug therapy: Secondary | ICD-10-CM | POA: Insufficient documentation

## 2018-03-24 DIAGNOSIS — E039 Hypothyroidism, unspecified: Secondary | ICD-10-CM | POA: Insufficient documentation

## 2018-03-24 DIAGNOSIS — M329 Systemic lupus erythematosus, unspecified: Secondary | ICD-10-CM | POA: Diagnosis not present

## 2018-03-24 DIAGNOSIS — Z87892 Personal history of anaphylaxis: Secondary | ICD-10-CM | POA: Insufficient documentation

## 2018-03-24 DIAGNOSIS — Z885 Allergy status to narcotic agent status: Secondary | ICD-10-CM | POA: Insufficient documentation

## 2018-03-24 DIAGNOSIS — M3500 Sicca syndrome, unspecified: Secondary | ICD-10-CM | POA: Insufficient documentation

## 2018-03-24 DIAGNOSIS — M797 Fibromyalgia: Secondary | ICD-10-CM | POA: Diagnosis not present

## 2018-03-24 DIAGNOSIS — Z79890 Hormone replacement therapy (postmenopausal): Secondary | ICD-10-CM | POA: Insufficient documentation

## 2018-03-24 DIAGNOSIS — K029 Dental caries, unspecified: Secondary | ICD-10-CM | POA: Diagnosis present

## 2018-03-24 DIAGNOSIS — Z880 Allergy status to penicillin: Secondary | ICD-10-CM | POA: Insufficient documentation

## 2018-03-24 HISTORY — PX: MULTIPLE EXTRACTIONS WITH ALVEOLOPLASTY: SHX5342

## 2018-03-24 SURGERY — MULTIPLE EXTRACTION WITH ALVEOLOPLASTY
Anesthesia: General | Site: Mouth

## 2018-03-24 MED ORDER — SCOPOLAMINE 1 MG/3DAYS TD PT72
1.0000 | MEDICATED_PATCH | Freq: Once | TRANSDERMAL | Status: DC | PRN
Start: 2018-03-24 — End: 2018-03-24

## 2018-03-24 MED ORDER — ONDANSETRON HCL 4 MG/2ML IJ SOLN
INTRAMUSCULAR | Status: AC
  Filled 2018-03-24: qty 2

## 2018-03-24 MED ORDER — OXYCODONE-ACETAMINOPHEN 5-325 MG PO TABS: 1.0000 | ORAL_TABLET | ORAL | 0 refills | Status: DC | PRN

## 2018-03-24 MED ORDER — SUCCINYLCHOLINE CHLORIDE 20 MG/ML IJ SOLN
INTRAMUSCULAR | Status: DC | PRN
  Administered 2018-03-24: 40 mg via INTRAVENOUS

## 2018-03-24 MED ORDER — ONDANSETRON HCL 4 MG/2ML IJ SOLN
INTRAMUSCULAR | Status: DC | PRN
  Administered 2018-03-24: 4 mg via INTRAVENOUS

## 2018-03-24 MED ORDER — LIDOCAINE-EPINEPHRINE 2 %-1:100000 IJ SOLN
INTRAMUSCULAR | Status: DC | PRN
  Administered 2018-03-24: 14 mL

## 2018-03-24 MED ORDER — LACTATED RINGERS IV SOLN
INTRAVENOUS | Status: DC
  Administered 2018-03-24 (×2): via INTRAVENOUS

## 2018-03-24 MED ORDER — CLINDAMYCIN PHOSPHATE 900 MG/50ML IV SOLN
INTRAVENOUS | Status: AC
  Filled 2018-03-24: qty 50

## 2018-03-24 MED ORDER — PROMETHAZINE HCL 25 MG/ML IJ SOLN: 6.2500 mg | INTRAMUSCULAR | Status: DC | PRN

## 2018-03-24 MED ORDER — MIDAZOLAM HCL 2 MG/2ML IJ SOLN
1.0000 mg | INTRAMUSCULAR | Status: DC | PRN
  Administered 2018-03-24: 2 mg via INTRAVENOUS

## 2018-03-24 MED ORDER — FENTANYL CITRATE (PF) 100 MCG/2ML IJ SOLN
50.0000 ug | INTRAMUSCULAR | Status: DC | PRN
  Administered 2018-03-24: 50 ug via INTRAVENOUS

## 2018-03-24 MED ORDER — FENTANYL CITRATE (PF) 100 MCG/2ML IJ SOLN
INTRAMUSCULAR | Status: AC
  Filled 2018-03-24: qty 2

## 2018-03-24 MED ORDER — CLINDAMYCIN PHOSPHATE 900 MG/50ML IV SOLN
900.0000 mg | INTRAVENOUS | Status: AC
  Administered 2018-03-24: 900 mg via INTRAVENOUS

## 2018-03-24 MED ORDER — MIDAZOLAM HCL 2 MG/2ML IJ SOLN
INTRAMUSCULAR | Status: AC
  Filled 2018-03-24: qty 2

## 2018-03-24 MED ORDER — HYDROMORPHONE HCL 1 MG/ML IJ SOLN
INTRAMUSCULAR | Status: AC
  Filled 2018-03-24: qty 0.5

## 2018-03-24 MED ORDER — PROPOFOL 10 MG/ML IV BOLUS
INTRAVENOUS | Status: DC | PRN
  Administered 2018-03-24: 200 mg via INTRAVENOUS

## 2018-03-24 MED ORDER — DEXAMETHASONE SODIUM PHOSPHATE 10 MG/ML IJ SOLN
INTRAMUSCULAR | Status: AC
  Filled 2018-03-24: qty 1

## 2018-03-24 MED ORDER — CLINDAMYCIN HCL 300 MG PO CAPS: 300.0000 mg | ORAL_CAPSULE | Freq: Three times a day (TID) | ORAL | 0 refills | Status: DC

## 2018-03-24 MED ORDER — OXYCODONE HCL 5 MG/5ML PO SOLN
5.0000 mg | Freq: Once | ORAL | Status: AC
  Administered 2018-03-24: 5 mg via ORAL

## 2018-03-24 MED ORDER — PROPOFOL 10 MG/ML IV BOLUS
INTRAVENOUS | Status: AC
  Filled 2018-03-24: qty 20

## 2018-03-24 MED ORDER — HYDROMORPHONE HCL 1 MG/ML IJ SOLN
0.2500 mg | INTRAMUSCULAR | Status: DC | PRN
  Administered 2018-03-24 (×2): 0.5 mg via INTRAVENOUS

## 2018-03-24 MED ORDER — DEXAMETHASONE SODIUM PHOSPHATE 4 MG/ML IJ SOLN
INTRAMUSCULAR | Status: DC | PRN
  Administered 2018-03-24: 10 mg via INTRAVENOUS

## 2018-03-24 MED ORDER — LIDOCAINE 2% (20 MG/ML) 5 ML SYRINGE
INTRAMUSCULAR | Status: DC | PRN
  Administered 2018-03-24: 60 mg via INTRAVENOUS

## 2018-03-24 MED ORDER — OXYCODONE HCL 5 MG/5ML PO SOLN
ORAL | Status: AC
  Filled 2018-03-24: qty 5

## 2018-03-24 SURGICAL SUPPLY — 48 items
BLADE DERMATOME SS (BLADE) IMPLANT
BLADE SURG 15 STRL LF DISP TIS (BLADE) ×1 IMPLANT
BLADE SURG 15 STRL SS (BLADE) ×3
BUR CROSS CUT FISSURE 1.6 (BURR) ×2 IMPLANT
BUR CROSS CUT FISSURE 1.6MM (BURR) ×1
BUR EGG ELITE 4.0 (BURR) ×2 IMPLANT
BUR EGG ELITE 4.0MM (BURR) ×1
CANISTER SUCT 1200ML W/VALVE (MISCELLANEOUS) ×3 IMPLANT
CATH ROBINSON RED A/P 10FR (CATHETERS) IMPLANT
CLOSURE WOUND 1/2 X4 (GAUZE/BANDAGES/DRESSINGS)
COVER BACK TABLE 60X90IN (DRAPES) ×3 IMPLANT
COVER MAYO STAND STRL (DRAPES) ×3 IMPLANT
DECANTER SPIKE VIAL GLASS SM (MISCELLANEOUS) IMPLANT
DEPRESSOR TONGUE BLADE STERILE (MISCELLANEOUS) IMPLANT
DRAPE U-SHAPE 76X120 STRL (DRAPES) ×3 IMPLANT
DRSG TEGADERM 4X10 (GAUZE/BANDAGES/DRESSINGS) IMPLANT
GAUZE PACKING FOLDED 2  STR (GAUZE/BANDAGES/DRESSINGS) ×2
GAUZE PACKING FOLDED 2 STR (GAUZE/BANDAGES/DRESSINGS) ×1 IMPLANT
GAUZE PACKING IODOFORM 1/4X15 (GAUZE/BANDAGES/DRESSINGS) IMPLANT
GLOVE BIO SURGEON STRL SZ 6.5 (GLOVE) ×2 IMPLANT
GLOVE BIO SURGEON STRL SZ7.5 (GLOVE) ×3 IMPLANT
GLOVE BIO SURGEONS STRL SZ 6.5 (GLOVE) ×1
GLOVE BIOGEL PI IND STRL 6.5 (GLOVE) ×1 IMPLANT
GLOVE BIOGEL PI INDICATOR 6.5 (GLOVE) ×2
GOWN STRL REUS W/ TWL LRG LVL3 (GOWN DISPOSABLE) ×1 IMPLANT
GOWN STRL REUS W/ TWL XL LVL3 (GOWN DISPOSABLE) ×1 IMPLANT
GOWN STRL REUS W/TWL LRG LVL3 (GOWN DISPOSABLE) ×3
GOWN STRL REUS W/TWL XL LVL3 (GOWN DISPOSABLE) ×3
IV NS 500ML (IV SOLUTION)
IV NS 500ML BAXH (IV SOLUTION) ×1 IMPLANT
NEEDLE HYPO 22GX1.5 SAFETY (NEEDLE) ×5 IMPLANT
NS IRRIG 1000ML POUR BTL (IV SOLUTION) ×3 IMPLANT
PACK BASIN DAY SURGERY FS (CUSTOM PROCEDURE TRAY) ×3 IMPLANT
SLEEVE SCD COMPRESS KNEE MED (MISCELLANEOUS) ×2 IMPLANT
SPONGE SURGIFOAM ABS GEL 12-7 (HEMOSTASIS) IMPLANT
STRIP CLOSURE SKIN 1/2X4 (GAUZE/BANDAGES/DRESSINGS) IMPLANT
SUT CHROMIC 3 0 PS 2 (SUTURE) ×3 IMPLANT
SYR 20CC LL (SYRINGE) ×2 IMPLANT
SYR BULB 3OZ (MISCELLANEOUS) ×3 IMPLANT
SYR CONTROL 10ML LL (SYRINGE) ×3 IMPLANT
TOOTHBRUSH ADULT (PERSONAL CARE ITEMS) IMPLANT
TOWEL GREEN STERILE FF (TOWEL DISPOSABLE) ×3 IMPLANT
TOWEL OR NON WOVEN STRL DISP B (DISPOSABLE) ×1 IMPLANT
TRAY DSU PREP LF (CUSTOM PROCEDURE TRAY) IMPLANT
TUBE CONNECTING 20'X1/4 (TUBING) ×1
TUBE CONNECTING 20X1/4 (TUBING) ×2 IMPLANT
TUBING IRRIGATION (MISCELLANEOUS) ×1 IMPLANT
YANKAUER SUCT BULB TIP NO VENT (SUCTIONS) ×3 IMPLANT

## 2018-03-24 NOTE — H&P (Signed)
HISTORY AND PHYSICAL  [name] is a 27 y.o. female patient with CC: referred by general dentist for multiple extractions.  No diagnosis found.  Past Medical History:  Diagnosis Date  . Bladder mass   . Chronic interstitial cystitis with hematuria    2016  . Fibromyalgia   . GERD (gastroesophageal reflux disease)   . Gross hematuria   . History of Graves' disease    tx done in 2007  . History of migraine   . History of recurrent UTIs   . Hypothyroidism   . Iron deficiency anemia    due to SGS  . Lower urinary tract symptoms (LUTS)   . Lupus [facility](HCC)    rheumologist--  dr Janalyn Rouseshaili devashwer  . Malabsorption syndrome   . Passage of loose stools    chronic --  due to short gut syndrome  . Pleuritis   . S/P radioactive iodine thyroid ablation    2007  . SGS (short gut syndrome)   . Sjogren's disease (HCC)   . Vitiligo   . Wears glasses     Current Facility-Administered Medications  Medication Dose Route Frequency Provider Last Rate Last Dose  . clindamycin (CLEOCIN) IVPB 900 mg  900 mg Intravenous On Call to OR Ocie DoyneJensen, Loana Salvaggio, DDS      . fentaNYL (SUBLIMAZE) injection 50-100 mcg  50-100 mcg Intravenous PRN Trevor IhaHouser, Stephen A, MD      . lactated ringers infusion   Intravenous Continuous Trevor IhaHouser, Stephen A, MD      . midazolam (VERSED) injection 1-2 mg  1-2 mg Intravenous PRN Trevor IhaHouser, Stephen A, MD      . scopolamine (TRANSDERM-SCOP) 1 MG/3DAYS 1.5 mg  1 patch Transdermal Once PRN Trevor IhaHouser, Stephen A, MD       Allergies  Allergen Reactions  . Penicillins Anaphylaxis  . Morphine And Related Itching   Active Problems:   * No active hospital problems. *  Vitals: Height 5' (1.524 m), weight 44.5 kg, last menstrual period 03/14/2018. Lab results:No results found for this or any previous visit (from the past 24 hour(s)). Radiology Results: No results found. General appearance: alert, cooperative and no distress Head: Normocephalic, without obvious abnormality,  atraumatic Eyes: negative Nose: Nares normal. Septum midline. Mucosa normal. No drainage or sinus tenderness. Throat: multiple carious teeth. No purulence, edema, fluctuance. No trismus. Neck: no adenopathy, no JVD and thyroid not enlarged, symmetric, no tenderness/mass/nodules Resp: clear to auscultation bilaterally Cardio: regular rate and rhythm, S1, S2 normal, no murmur, click, rub or gallop Skin: facial vitiligo  Assessment:  Plan:   Ocie DoyneScott Nattaly Yebra 03/24/2018

## 2018-03-24 NOTE — Op Note (Signed)
03/24/2018  9:30 AM  PATIENT:  [name]  27 y.o. female  PRE-OPERATIVE DIAGNOSIS:  non restorable teeth # 2, 3, 4, 5, 6, 14, 18, 20, 21  POST-OPERATIVE DIAGNOSIS:  SAME  PROCEDURE:  Procedure(s): EXTRACTIONS 2, 3,4, 5, 6, 14, 18, 20, 21, alveoloplasty right and left maxilla, left mandible  SURGEON:  Surgeon(s): Ocie DoyneJensen, Verline Kong, DDS  ANESTHESIA:   local and general  EBL:  minimal  DRAINS: none   SPECIMEN:  No Specimen  COUNTS:  YES  PLAN OF CARE: Discharge to home after PACU  PATIENT DISPOSITION:  PACU - hemodynamically stable.   PROCEDURE DETAILS: Dictation # [id number]  Georgia LopesScott M. Jeryn Bertoni, DMD 03/24/2018 9:30 AM

## 2018-03-24 NOTE — Anesthesia Postprocedure Evaluation (Signed)
Anesthesia Post Note  Patient: [name]  Procedure(s) Performed: EXTRACTIONS X 9 (N/A Mouth)     Patient location during evaluation: PACU Anesthesia Type: General Level of consciousness: awake and alert Pain management: pain level controlled Vital Signs Assessment: post-procedure vital signs reviewed and stable Respiratory status: spontaneous breathing, nonlabored ventilation, respiratory function stable and patient connected to nasal cannula oxygen Cardiovascular status: blood pressure returned to baseline and stable Postop Assessment: no apparent nausea or vomiting Anesthetic complications: no    Last Vitals:  Vitals:   03/24/18 1030 03/24/18 1142  BP: (!) 131/98 122/88  Pulse: 87 69  Resp: 12 16  Temp:  36.6 C  SpO2: 99% 97%    Last Pain:  Vitals:   03/24/18 1142  TempSrc:   PainSc: 6                  Ryan P Ellender

## 2018-03-24 NOTE — H&P (Signed)
Anesthesia H&P Update: History and Physical Exam reviewed; patient is OK for planned anesthetic and procedure. ? ?

## 2018-03-24 NOTE — Transfer of Care (Signed)
Immediate Anesthesia Transfer of Care Note  Patient: [name]  Procedure(s) Performed: EXTRACTIONS X 9 (N/A Mouth)  Patient Location: PACU  Anesthesia Type:General  Level of Consciousness: awake, alert  and oriented  Airway & Oxygen Therapy: Patient Spontanous Breathing and Patient connected to face mask oxygen  Post-op Assessment: Report given to RN and Post -op Vital signs reviewed and stable  Post vital signs: Reviewed and stable  Last Vitals:  Vitals Value Taken Time  BP 83/69 03/24/2018  9:47 AM  Temp    Pulse 89 03/24/2018  9:49 AM  Resp 16 03/24/2018  9:49 AM  SpO2 100 % 03/24/2018  9:49 AM  Vitals shown include unvalidated device data.  Last Pain:  Vitals:   03/24/18 0946  TempSrc:   PainSc: (P) Asleep         Complications: No apparent anesthesia complications

## 2018-03-24 NOTE — Anesthesia Procedure Notes (Signed)
Procedure Name: Intubation Date/Time: 03/24/2018 8:51 AM Performed by: Maryella Shivers, CRNA Pre-anesthesia Checklist: Patient identified, Emergency Drugs available, Suction available and Patient being monitored Patient Re-evaluated:Patient Re-evaluated prior to induction Oxygen Delivery Method: Circle system utilized Preoxygenation: Pre-oxygenation with 100% oxygen Induction Type: IV induction Ventilation: Mask ventilation without difficulty Laryngoscope Size: Mac and 3 Grade View: Grade I Nasal Tubes: Nasal prep performed, Nasal Rae and Left Tube size: 6.0 mm Placement Confirmation: ETT inserted through vocal cords under direct vision,  positive ETCO2 and breath sounds checked- equal and bilateral Secured at: 23 cm Tube secured with: Tape Dental Injury: Teeth and Oropharynx as per pre-operative assessment

## 2018-03-24 NOTE — Discharge Instructions (Signed)
Oxycodone 5mg  given today at 11:20am.   Post Anesthesia Home Care Instructions  Activity: Get plenty of rest for the remainder of the day. A responsible individual must stay with you for 24 hours following the procedure.  For the next 24 hours, DO NOT: -Drive a car -Advertising copywriterperate machinery -Drink alcoholic beverages -Take any medication unless instructed by your physician -Make any legal decisions or sign important papers.  Meals: Start with liquid foods such as gelatin or soup. Progress to regular foods as tolerated. Avoid greasy, spicy, heavy foods. If nausea and/or vomiting occur, drink only clear liquids until the nausea and/or vomiting subsides. Call your physician if vomiting continues.  Special Instructions/Symptoms: Your throat may feel dry or sore from the anesthesia or the breathing tube placed in your throat during surgery. If this causes discomfort, gargle with warm salt water. The discomfort should disappear within 24 hours.  If you had a scopolamine patch placed behind your ear for the management of post- operative nausea and/or vomiting:  1. The medication in the patch is effective for 72 hours, after which it should be removed.  Wrap patch in a tissue and discard in the trash. Wash hands thoroughly with soap and water. 2. You may remove the patch earlier than 72 hours if you experience unpleasant side effects which may include dry mouth, dizziness or visual disturbances. 3. Avoid touching the patch. Wash your hands with soap and water after contact with the patch.

## 2018-03-24 NOTE — Op Note (Signed)
NAME: [name], [name] MEDICAL RECORD [id number] ACCOUNT [id number] DATE OF BIRTH:09/28/1990 FACILITY: [facility] LOCATION: [facility] PHYSICIAN:Daren Yeagle M. Devontaye Ground, DDS  OPERATIVE REPORT  DATE OF PROCEDURE:  03/24/2018  PREOPERATIVE DIAGNOSIS:  Nonrestorable teeth secondary to dental caries 2, 3, 4, 5, 6, 14, 18, 20, 21.  PROCEDURE:  Extraction of teeth 2, 3, 4, 5, 6, 14, 18, 20, 21; alveoplasty right and left maxilla, left mandible.  SURGEON:  Ocie DoyneScott Quenton Recendez, DDS  ANESTHESIA:  General, nasal intubation.  DESCRIPTION OF PROCEDURE:  The patient was taken to the operating room and placed on the table in supine position.  General anesthesia was administered intravenously, and a nasal endotracheal tube was placed and secured.  The eyes were protected and the  patient was draped for surgery.  A timeout was performed.  The posterior pharynx was suctioned and a throat pack was placed.  Lidocaine 2% 1:100,000 epinephrine was infiltrated in an inferior alveolar block on the left side and in buccal and palatal  infiltration in the maxilla around the teeth to be removed.  A total of 14 mL was utilized.  A bite block was placed on the right side of the mouth, and a sweetheart retractor was used to retract the tongue.  A #15 blade was used to make an incision  around teeth numbers 18, 20 and 21.  The periosteum was reflected with a periosteal elevator.  Tooth #18 was elevated as well as 20 and 21.  Tooth #18 was unable to be removed with the dental forceps.  Tooth #20 was removed with the Ash forceps.  Bone  was removed around tooth #18, and the tooth was sectioned.  Then bone was removed around tooth #21, and then these teeth were removed with the rongeur.  The sockets were then curetted.  There was a gross contour deformity.  After removing these teeth, an  alveoplasty was then performed using an egg-shaped bur and bone file.  Then the area was closed with 3-0 chromic.  Then tooth 14 was removed.  A 15  blade was used to make a sulcular incision around the tooth.  The periosteum was reflected with a  periosteal elevator.  The tooth was elevated with a 301 elevator; however, it could not move or be extracted using the dental forceps.  Then the tooth was sectioned.  The crown was removed.  The roots were removed by separating them with the handpiece  bur under irrigation, and then the tooth was removed with rongeurs.  Then the periosteum was reflected to expose the alveolar ridge which contained bony sharp areas.  These were reduced using the egg-shaped bur.  Then the area was closed with 3-0  chromic.  Then, the bite block and sweetheart retractor were repositioned to the other side of the mouth.  A 15 blade was used to make an incision around teeth numbers 2, 3, 4, 5 and 6 buccally and lingually, and then the periosteum was reflected.   Attempts were made to remove these teeth with forceps, and they were unsuccessful.  Therefore, bone was removed starting at tooth #6 circumferentially.  Bone was removed around this tooth, and then the tooth was removed with the upper forceps.  Tooth  numbers 5 and 4 were removed by removing additional bone with a Stryker handpiece circumferentially and then removing the teeth with the forceps.  Teeth numbers 2 and 3 required sectioning and removal of each root individually.  Then the right maxilla  was irregular in contour because of sharp  bony edges of socket, so an alveoplasty was performed using an egg-shaped bur and bone file.  Then the area was irrigated and closed.  The oral cavity was then irrigated and suctioned, and the throat pack was  removed.  The patient was left in the care of anesthesia for extubation and transport to recovery room with plans for discharge home through day surgery.  ESTIMATED BLOOD LOSS:  Minimal.  COMPLICATIONS:  None.  LN/NUANCE  D:03/24/2018 T:03/24/2018 JOB:[id number]

## 2018-03-24 NOTE — Anesthesia Preprocedure Evaluation (Signed)
Anesthesia Evaluation  Patient identified by MRN, date of birth, ID band Patient awake    Reviewed: Allergy & Precautions, NPO status , Patient's Chart, lab work & pertinent test results  Airway Mallampati: III  TM Distance: >3 FB Neck ROM: Full    Dental  (+) Poor Dentition, Missing,    Pulmonary neg pulmonary ROS,    Pulmonary exam normal breath sounds clear to auscultation       Cardiovascular negative cardio ROS Normal cardiovascular exam Rhythm:Regular Rate:Normal     Neuro/Psych  Headaches, PSYCHIATRIC DISORDERS    GI/Hepatic Neg liver ROS, GERD  Medicated,SGS (short gut syndrome) Malabsorption syndrome   Endo/Other  Hypothyroidism Lupus  Sjogren's disease  Renal/GU negative Renal ROS   Chronic interstitial cystitis with hematuria    Musculoskeletal  (+) Fibromyalgia -  Abdominal   Peds  Hematology negative hematology ROS (+)   Anesthesia Other Findings non restorable  Reproductive/Obstetrics                             Anesthesia Physical Anesthesia Plan  ASA: III  Anesthesia Plan: General   Post-op Pain Management:    Induction: Intravenous  PONV Risk Score and Plan: 3 and Midazolam, Dexamethasone, Ondansetron and Treatment may vary due to age or medical condition  Airway Management Planned: Nasal ETT  Additional Equipment:   Intra-op Plan:   Post-operative Plan: Extubation in OR  Informed Consent: I have reviewed the patients History and Physical, chart, labs and discussed the procedure including the risks, benefits and alternatives for the proposed anesthesia with the patient or authorized representative who has indicated his/her understanding and acceptance.   Dental advisory given  Plan Discussed with: CRNA  Anesthesia Plan Comments:         Anesthesia Quick Evaluation

## 2018-03-25 ENCOUNTER — Encounter (HOSPITAL_BASED_OUTPATIENT_CLINIC_OR_DEPARTMENT_OTHER): Payer: Self-pay | Admitting: Oral Surgery

## 2018-03-25 MED FILL — SYNTHROID 175 MCG TABLET: 175 | 30 days supply | Qty: 30 | Fill #5

## 2018-03-25 MED FILL — HYDROXYCHLOROQUINE SULFATE: 200 | 30 days supply | Qty: 30 | Fill #1

## 2018-04-09 ENCOUNTER — Telehealth: Payer: Self-pay | Admitting: Hematology and Oncology

## 2018-04-09 NOTE — Telephone Encounter (Signed)
[facility] PAL 11/8 - moved f/u to 11/12. Other appointments remain the same. Left message. Schedule mailed.

## 2018-04-29 MED FILL — SYNTHROID 175 MCG TABLET: 175 | 30 days supply | Qty: 30 | Fill #0

## 2018-04-29 MED FILL — HYDROXYCHLOROQUINE SULFATE: 200 | 30 days supply | Qty: 30 | Fill #2

## 2018-05-07 ENCOUNTER — Inpatient Hospital Stay: Payer: Medicaid Other | Attending: Hematology and Oncology

## 2018-05-07 DIAGNOSIS — R42 Dizziness and giddiness: Secondary | ICD-10-CM | POA: Insufficient documentation

## 2018-05-07 DIAGNOSIS — R51 Headache: Secondary | ICD-10-CM | POA: Diagnosis not present

## 2018-05-07 DIAGNOSIS — D500 Iron deficiency anemia secondary to blood loss (chronic): Secondary | ICD-10-CM

## 2018-05-07 DIAGNOSIS — N920 Excessive and frequent menstruation with regular cycle: Secondary | ICD-10-CM | POA: Diagnosis not present

## 2018-05-07 DIAGNOSIS — Z79899 Other long term (current) drug therapy: Secondary | ICD-10-CM | POA: Insufficient documentation

## 2018-05-07 DIAGNOSIS — R5383 Other fatigue: Secondary | ICD-10-CM | POA: Insufficient documentation

## 2018-05-07 LAB — CBC WITH DIFFERENTIAL (CANCER CENTER ONLY)
ABS IMMATURE GRANULOCYTES: 0.01 10*3/uL (ref 0.00–0.07)
BASOS PCT: 0 %
Basophils Absolute: 0 10*3/uL (ref 0.0–0.1)
Eosinophils Absolute: 0.1 10*3/uL (ref 0.0–0.5)
Eosinophils Relative: 2 %
HCT: 37.1 % (ref 36.0–46.0)
Hemoglobin: 11.7 g/dL — ABNORMAL LOW (ref 12.0–15.0)
IMMATURE GRANULOCYTES: 0 %
LYMPHS PCT: 35 %
Lymphs Abs: 1.4 10*3/uL (ref 0.7–4.0)
MCH: 29.3 pg (ref 26.0–34.0)
MCHC: 31.5 g/dL (ref 30.0–36.0)
MCV: 93 fL (ref 80.0–100.0)
MONO ABS: 0.2 10*3/uL (ref 0.1–1.0)
Monocytes Relative: 5 %
NEUTROS ABS: 2.4 10*3/uL (ref 1.7–7.7)
NEUTROS PCT: 58 %
NRBC: 0 % (ref 0.0–0.2)
PLATELETS: 212 10*3/uL (ref 150–400)
RBC: 3.99 MIL/uL (ref 3.87–5.11)
RDW: 13.3 % (ref 11.5–15.5)
WBC Count: 4.1 10*3/uL (ref 4.0–10.5)

## 2018-05-07 LAB — IRON AND TIBC
IRON: 33 ug/dL — AB (ref 41–142)
SATURATION RATIOS: 10 % — AB (ref 21–57)
TIBC: 345 ug/dL (ref 236–444)
UIBC: 312 ug/dL (ref 120–384)

## 2018-05-07 LAB — FERRITIN: FERRITIN: 12 ng/mL (ref 11–307)

## 2018-05-08 DIAGNOSIS — N643 Galactorrhea not associated with childbirth: Secondary | ICD-10-CM | POA: Insufficient documentation

## 2018-05-09 ENCOUNTER — Ambulatory Visit: Payer: Medicaid Other | Admitting: Hematology and Oncology

## 2018-05-09 NOTE — Progress Notes (Addendum)
Patient Care Team: Charlies Silvers, PA-C as PCP - General (Physician Assistant)  DIAGNOSIS: Iron deficiency anemia due to chronic blood loss  CHIEF COMPLIANT: Follow up on labs and iron deficiency anemia  INTERVAL HISTORY: [name] is a 27 y.o. female with a prior history of heavy menstrual bleeding as well as a urinary bleeding from interstitial cystitis who received intravenous iron therapy in July 2017 .  She was last seen by me 6 months ago. She presents to the clinic today by herself. She notes she has felt tired. She also notes she passed out recently with dizziness. She presented to the doctor and was found to have the Flu. She notes she will occasionally get cravings for ice. She notes after IV iron she would feel fine for the first 2 months. She would like another dose of IV iron. She denies bleeding except from heavy menses which can range from 4 days to 2 weeks. She has tried birth control before but could not tolerate. She notes her BP has been steady lately and notess wearing compression socks help with her BP.   REVIEW OF SYSTEMS:   Constitutional: Denies fevers, chills or abnormal weight loss (+) fatigue/tiredness (+) occasional dizziness Eyes: Denies blurriness of vision Ears, nose, mouth, throat, and face: Denies mucositis or sore throat Respiratory: Denies cough, dyspnea or wheezes Cardiovascular: Denies palpitation, chest discomfort Gastrointestinal:  Denies nausea, heartburn or change in bowel habits Skin: Denies abnormal skin rashes Lymphatics: Denies new lymphadenopathy or easy bruising Neurological:Denies numbness, tingling or new weaknesses Behavioral/Psych: Mood is stable, no new changes  Extremities: No lower extremity edema  I have reviewed the past medical history, past surgical history, social history and family history with the patient and they are unchanged from previous note.  ALLERGIES:  is allergic to penicillins and morphine and  related.  MEDICATIONS:  Current Outpatient Medications  Medication Sig Dispense Refill  . AMBULATORY NON FORMULARY MEDICATION Iron Infusions every 3 months done at [facility]- ordered thru [facility]    . cholestyramine (QUESTRAN) 4 g packet Take 1 packet (4 g total) by mouth 2 (two) times daily. 60 each 6  . clindamycin (CLEOCIN) 300 MG capsule Take 1 capsule (300 mg total) by mouth 3 (three) times daily. 21 capsule 0  . cyclobenzaprine (FLEXERIL) 10 MG tablet Take 1 tablet (10 mg total) by mouth 2 (two) times daily as needed for muscle spasms. 14 tablet 0  . diazepam (DIASTAT ACUDIAL) 10 MG GEL Place 10 mg rectally at bedtime.    . dicyclomine (BENTYL) 10 MG capsule Take 1 capsule (10 mg total) by mouth 2 (two) times daily. 60 capsule 1  . diphenoxylate-atropine (LOMOTIL) 2.5-0.025 MG tablet One tablet twice a day 60 tablet 0  . hydroxychloroquine (PLAQUENIL) 200 MG tablet Take 400 mg by mouth every morning.     . hyoscyamine (LEVBID) 0.375 MG 12 hr tablet Take 1 tab 3 times daily as needed for cramping and spasms. 90 tablet 6  . levothyroxine (SYNTHROID) 100 MCG tablet Take 175 mcg by mouth daily.     Marland Kitchen OMEPRAZOLE PO Take 20 mg by mouth at bedtime.     Marland Kitchen oxyCODONE-acetaminophen (PERCOCET) 5-325 MG tablet Take 1 tablet by mouth every 4 (four) hours as needed. 24 tablet 0  . pilocarpine (SALAGEN) 5 MG tablet Take 1 tablet (5 mg total) by mouth 2 (two) times daily. 60 tablet 0  . QUEtiapine (SEROQUEL) 300 MG tablet Take 300 mg by mouth at  bedtime.    Marland Kitchen tiZANidine (ZANAFLEX) 4 MG tablet Take 4 mg by mouth every 8 (eight) hours as needed for muscle spasms.      No current facility-administered medications for this visit.     PHYSICAL EXAMINATION: ECOG PERFORMANCE STATUS: 1 - Symptomatic but completely ambulatory  Vitals:   05/13/18 0829  BP: 109/83  Pulse: 85  Resp: 20  Temp: 98.5 F (36.9 C)  SpO2: 100%   Filed Weights   05/13/18 0829  Weight: 94 lb 12.8 oz (43 kg)     GENERAL:alert, no distress and comfortable SKIN: skin color, texture, turgor are normal, no rashes or significant lesions EYES: normal, Conjunctiva are pink and non-injected, sclera clear OROPHARYNX:no exudate, no erythema and lips, buccal mucosa, and tongue normal  NECK: supple, thyroid normal size, non-tender, without nodularity LYMPH:  no palpable lymphadenopathy in the cervical, axillary or inguinal LUNGS: clear to auscultation and percussion with normal breathing effort HEART: regular rate & rhythm and no murmurs and no lower extremity edema ABDOMEN:abdomen soft, non-tender and normal bowel sounds MUSCULOSKELETAL:no cyanosis of digits and no clubbing  NEURO: alert & oriented x 3 with fluent speech, no focal motor/sensory deficits EXTREMITIES: No lower extremity edema   LABORATORY DATA:  I have reviewed the data as listed CMP Latest Ref Rng & Units 07/24/2017 03/08/2017 03/07/2016  Glucose 70 - 99 mg/dL 604(V) 74 88  BUN 6 - 23 mg/dL 11 8 11   Creatinine 0.40 - 1.20 mg/dL 4.09 8.11 9.14  Sodium 135 - 145 mEq/L 139 137 139  Potassium 3.5 - 5.1 mEq/L 3.4(L) 3.5 3.7  Chloride 96 - 112 mEq/L 109 105 107  CO2 19 - 32 mEq/L 17(L) 24 28  Calcium 8.4 - 10.5 mg/dL 9.3 9.8 9.0  Total Protein 6.0 - 8.3 g/dL 7.5 - -  Total Bilirubin 0.2 - 1.2 mg/dL 7.8(G) - -  Alkaline Phos 39 - 117 U/L 57 - -  AST 0 - 37 U/L 19 - -  ALT 0 - 35 U/L 18 - -    Lab Results  Component Value Date   WBC 4.1 05/07/2018   HGB 11.7 (L) 05/07/2018   HCT 37.1 05/07/2018   MCV 93.0 05/07/2018   PLT 212 05/07/2018   NEUTROABS 2.4 05/07/2018    ASSESSMENT & PLAN:   Iron deficiency anemia due to chronic blood loss Patient has had heavy menstrual bleeding accompanied by urinary tract bleeding from chronic interstitial cystitis. She has been on oral iron therapy since 2012 although at times she was taken off iron supplementation. Since February she has been anemic with a hemoglobin of 10.6-10.9. In spite of being  on oral iron treatment her hemoglobin has remained low. She continuedto have symptoms related to iron deficiency with fatigue, ice craving, headaches etc. all of these symptoms improved with IV iron treatment given July 2017  Treatment summary: IV iron given July 2017, May 2019  Lab review:05/07/2018: Iron saturation 10%, hemoglobin 11.7, MCV 93, ferritin 12  I discussed with the patient that the results continue to show iron deficiency but because she is asymptomatic we can watch and monitor her.  She may require iron infusion in the future.  We will see her back in 6 months with labs done ahead of time.    Orders Placed This Encounter  Procedures  . Ferritin    Standing Status:   Future    Standing Expiration Date:   05/13/2019  . Iron and TIBC    Standing Status:  Future    Standing Expiration Date:   05/13/2019  . CBC with Differential (Cancer Center Only)    Standing Status:   Future    Standing Expiration Date:   05/14/2019   The patient has a good understanding of the overall plan. she agrees with it. she will call with any problems that may develop before the next visit here.   Tamsen Meek, MD 05/13/2018 8:44 AM   I, Delphina Cahill, am acting as scribe for Serena Croissant, MD.  I have reviewed the above documentation for accuracy and completeness, and I agree with the above.   Addendum: The above assessment and plan is an error Based on her symptoms and borderline iron studies, we decided to proceed with IV iron infusion.

## 2018-05-13 ENCOUNTER — Inpatient Hospital Stay (HOSPITAL_BASED_OUTPATIENT_CLINIC_OR_DEPARTMENT_OTHER): Payer: Medicaid Other | Admitting: Hematology and Oncology

## 2018-05-13 ENCOUNTER — Encounter: Payer: Self-pay | Admitting: Hematology and Oncology

## 2018-05-13 ENCOUNTER — Telehealth: Payer: Self-pay | Admitting: Hematology and Oncology

## 2018-05-13 DIAGNOSIS — N920 Excessive and frequent menstruation with regular cycle: Secondary | ICD-10-CM

## 2018-05-13 DIAGNOSIS — R42 Dizziness and giddiness: Secondary | ICD-10-CM

## 2018-05-13 DIAGNOSIS — R51 Headache: Secondary | ICD-10-CM

## 2018-05-13 DIAGNOSIS — D500 Iron deficiency anemia secondary to blood loss (chronic): Secondary | ICD-10-CM | POA: Diagnosis not present

## 2018-05-13 DIAGNOSIS — R5383 Other fatigue: Secondary | ICD-10-CM

## 2018-05-13 DIAGNOSIS — Z79899 Other long term (current) drug therapy: Secondary | ICD-10-CM | POA: Diagnosis not present

## 2018-05-13 NOTE — Assessment & Plan Note (Signed)
Patient has had heavy menstrual bleeding accompanied by urinary tract bleeding from chronic interstitial cystitis. She has been on oral iron therapy since 2012 although at times she was taken off iron supplementation. Since February she has been anemic with a hemoglobin of 10.6-10.9. In spite of being on oral iron treatment her hemoglobin has remained low. She continuedto have symptoms related to iron deficiency with fatigue, ice craving, headaches etc. all of these symptoms improved with IV iron treatment given July 2017  Treatment summary: IV iron given July 2017, May 2019  Lab review:05/07/2018: Iron saturation 10%, hemoglobin 11.7, MCV 93, ferritin 12  I discussed with the patient that the results continue to show iron deficiency but because she is asymptomatic we can watch and monitor her.  She may require iron infusion in the future.  We will see her back in 6 months with labs done ahead of time.

## 2018-05-13 NOTE — Telephone Encounter (Signed)
Gave pt avs and calendar  °

## 2018-05-15 ENCOUNTER — Inpatient Hospital Stay: Payer: Medicaid Other

## 2018-05-15 VITALS — BP 103/78 | HR 80 | Temp 98.4°F | Resp 18

## 2018-05-15 DIAGNOSIS — D500 Iron deficiency anemia secondary to blood loss (chronic): Secondary | ICD-10-CM | POA: Diagnosis not present

## 2018-05-15 MED ORDER — SODIUM CHLORIDE 0.9 % IV SOLN
Freq: Once | INTRAVENOUS | Status: AC
  Administered 2018-05-15: 10:00:00 via INTRAVENOUS
  Filled 2018-05-15: qty 250

## 2018-05-15 MED ORDER — DIPHENHYDRAMINE HCL 25 MG PO CAPS
ORAL_CAPSULE | ORAL | Status: AC
  Filled 2018-05-15: qty 1

## 2018-05-15 MED ORDER — DIPHENHYDRAMINE HCL 25 MG PO CAPS
25.0000 mg | ORAL_CAPSULE | Freq: Once | ORAL | Status: AC
  Administered 2018-05-15: 25 mg via ORAL

## 2018-05-15 MED ORDER — SODIUM CHLORIDE 0.9 % IV SOLN
510.0000 mg | Freq: Once | INTRAVENOUS | Status: AC
  Administered 2018-05-15: 510 mg via INTRAVENOUS
  Filled 2018-05-15: qty 17

## 2018-05-15 MED FILL — SYNTHROID 150 MCG TABLET: 150 | 30 days supply | Qty: 30 | Fill #0

## 2018-05-15 NOTE — Patient Instructions (Signed)

## 2018-05-23 ENCOUNTER — Inpatient Hospital Stay: Payer: Medicaid Other

## 2018-05-23 VITALS — BP 99/75 | HR 78 | Temp 98.3°F | Resp 16

## 2018-05-23 DIAGNOSIS — D500 Iron deficiency anemia secondary to blood loss (chronic): Secondary | ICD-10-CM | POA: Diagnosis not present

## 2018-05-23 MED ORDER — DIPHENHYDRAMINE HCL 25 MG PO CAPS
25.0000 mg | ORAL_CAPSULE | Freq: Once | ORAL | Status: AC
  Administered 2018-05-23: 25 mg via ORAL

## 2018-05-23 MED ORDER — SODIUM CHLORIDE 0.9 % IV SOLN
Freq: Once | INTRAVENOUS | Status: AC
  Administered 2018-05-23: 09:00:00 via INTRAVENOUS
  Filled 2018-05-23: qty 250

## 2018-05-23 MED ORDER — SODIUM CHLORIDE 0.9 % IV SOLN
510.0000 mg | Freq: Once | INTRAVENOUS | Status: AC
  Administered 2018-05-23: 510 mg via INTRAVENOUS
  Filled 2018-05-23: qty 17

## 2018-05-23 MED ORDER — DIPHENHYDRAMINE HCL 25 MG PO CAPS
ORAL_CAPSULE | ORAL | Status: AC
  Filled 2018-05-23: qty 1

## 2018-05-23 NOTE — Patient Instructions (Signed)

## 2018-05-27 MED FILL — HYDROCORTISONE 1% OINTMENT: 1 | 10 days supply | Qty: 57 | Fill #0

## 2018-06-04 MED FILL — NYSTATIN 100000 UNIT/GM POW: 100000 | 15 days supply | Qty: 30 | Fill #0

## 2018-06-07 ENCOUNTER — Other Ambulatory Visit: Payer: Self-pay

## 2018-06-07 ENCOUNTER — Emergency Department (HOSPITAL_BASED_OUTPATIENT_CLINIC_OR_DEPARTMENT_OTHER): Payer: Medicaid Other

## 2018-06-07 ENCOUNTER — Emergency Department (HOSPITAL_BASED_OUTPATIENT_CLINIC_OR_DEPARTMENT_OTHER)
Admission: EM | Admit: 2018-06-07 | Discharge: 2018-06-08 | Disposition: A | Discharge status: Home / Self Care | Payer: Medicaid Other | Attending: Emergency Medicine | Admitting: Emergency Medicine

## 2018-06-07 ENCOUNTER — Encounter (HOSPITAL_BASED_OUTPATIENT_CLINIC_OR_DEPARTMENT_OTHER): Payer: Self-pay | Admitting: *Deleted

## 2018-06-07 DIAGNOSIS — R51 Headache: Secondary | ICD-10-CM | POA: Insufficient documentation

## 2018-06-07 DIAGNOSIS — R519 Headache, unspecified: Secondary | ICD-10-CM

## 2018-06-07 DIAGNOSIS — E039 Hypothyroidism, unspecified: Secondary | ICD-10-CM | POA: Diagnosis not present

## 2018-06-07 DIAGNOSIS — Z79899 Other long term (current) drug therapy: Secondary | ICD-10-CM | POA: Insufficient documentation

## 2018-06-07 MED ORDER — METOCLOPRAMIDE HCL 5 MG/ML IJ SOLN
10.0000 mg | Freq: Once | INTRAMUSCULAR | Status: AC
  Administered 2018-06-07: 10 mg via INTRAMUSCULAR
  Filled 2018-06-07: qty 2

## 2018-06-07 MED ORDER — DIPHENHYDRAMINE HCL 50 MG/ML IJ SOLN
25.0000 mg | Freq: Once | INTRAMUSCULAR | Status: AC
  Administered 2018-06-07: 25 mg via INTRAMUSCULAR
  Filled 2018-06-07: qty 1

## 2018-06-07 MED ORDER — KETOROLAC TROMETHAMINE 30 MG/ML IJ SOLN
30.0000 mg | Freq: Once | INTRAMUSCULAR | Status: AC
  Administered 2018-06-07: 30 mg via INTRAMUSCULAR
  Filled 2018-06-07: qty 1

## 2018-06-07 NOTE — ED Provider Notes (Signed)
[facility] EMERGENCY DEPARTMENT Provider Note   CSN: [id number] Arrival date & time: 06/07/18  2149     History   Chief Complaint Chief Complaint  Patient presents with  . Headache    HPI [name] is a 27 y.o. female.  Patient with history of fibromyalgia, Graves' disease, recurrent UTIs, migraine headaches, lupus with urostomy presenting with frontal headache that she woke with around 7 PM.  She reports history of migraines but this is more severe.  She took ibuprofen at home without relief.  Denies thunderclap onset.  Pain radiates across her entire head and neck.  No fevers.  Has had nausea but no vomiting.  No focal weakness, numbness or tingling.  Does have photophobia and phonophobia.  No visual change.  No chest pain or shortness of breath.  She denies any immunosuppressants for her lupus.  She denies any fever.  The pain in her head is gradual in onset and is associate with nausea and photophobia.   Headache   Associated symptoms include nausea. Pertinent negatives include no fever and no shortness of breath.    Past Medical History:  Diagnosis Date  . Bladder mass   . Chronic interstitial cystitis with hematuria    2016  . Fibromyalgia   . GERD (gastroesophageal reflux disease)   . Gross hematuria   . History of Graves' disease    tx done in 2007  . History of migraine   . History of recurrent UTIs   . Hypothyroidism   . Iron deficiency anemia    due to SGS  . Lower urinary tract symptoms (LUTS)   . Lupus [facility])    rheumologist--  dr Janalyn Rouse devashwer  . Malabsorption syndrome   . Passage of loose stools    chronic --  due to short gut syndrome  . Pleuritis   . S/P radioactive iodine thyroid ablation    2007  . SGS (short gut syndrome)   . Sjogren's disease (HCC)   . Vitiligo   . Wears glasses     Patient Active Problem List   Diagnosis Date Noted  . Hypersensitivity reaction 01/13/2016  . Iron deficiency anemia due to chronic  blood loss 01/05/2016  . Chronic interstitial cystitis with hematuria   . H/O Graves' disease 07/22/2013  . Sjogren's disease (HCC) 07/22/2013  . Malabsorption syndrome 07/22/2013  . Migraine with aura 07/22/2013  . Pelvic adhesive disease 07/22/2013  . Hypothyroidism complicating pregnancy / delivered 12/28/2011    Past Surgical History:  Procedure Laterality Date  . BOWEL RESECTION  newborn   necrotizing enterocolitis (liver patched)  . BRONCHOSCOPY  2014  . CESAREAN SECTION  01/02/2012   Procedure: CESAREAN SECTION;  Surgeon: Lenoard Aden, MD;  Location: [facility];  Service: Gynecology;  Laterality: N/A;  . COLONOSCOPY  07-29-2013  . CYSTECTOMY W/ URETEROILEAL CONDUIT  10/2017  . CYSTOSCOPY WITH BIOPSY N/A 11/23/2014   Procedure: CYSTOSCOPY WITH BLADDER  BIOPSY AND FULGERATION;  Surgeon: Bjorn Pippin, MD;  [facility];  Service: Urology;  Laterality: N/A;  . KNEE ARTHROSCOPY Right 2008  . MULTIPLE EXTRACTIONS WITH ALVEOLOPLASTY N/A 03/24/2018   Procedure: EXTRACTIONS X 9;  Surgeon: Ocie Doyne, DDS;  Location: [facility];  Service: Oral Surgery;  Laterality: N/A;     OB History    Gravida  3   Para  1   Term  1   Preterm  0   AB  1   Living  1     SAB  0   TAB  0   Ectopic  1   Multiple  0   Live Births  1            Home Medications    Prior to Admission medications   Medication Sig Start Date End Date Taking? Authorizing Provider  AMBULATORY NON FORMULARY MEDICATION Iron Infusions every 3 months done at [facility]- ordered thru [facility]    [provider]  cholestyramine (QUESTRAN) 4 g packet Take 1 packet (4 g total) by mouth 2 (two) times daily. 03/07/16   Esterwood, Amy S, PA-C  clindamycin (CLEOCIN) 300 MG capsule Take 1 capsule (300 mg total) by mouth 3 (three) times daily. 03/24/18   Ocie Doyne, DDS  cyclobenzaprine (FLEXERIL) 10 MG tablet Take 1 tablet (10 mg total) by mouth 2 (two)  times daily as needed for muscle spasms. 08/10/17   Robinson, [place] N, PA-C  diazepam (DIASTAT ACUDIAL) 10 MG GEL Place 10 mg rectally at bedtime.    [provider]  dicyclomine (BENTYL) 10 MG capsule Take 1 capsule (10 mg total) by mouth 2 (two) times daily. 03/08/17   Meredith Pel, NP  diphenoxylate-atropine (LOMOTIL) 2.5-0.025 MG tablet One tablet twice a day 03/08/17   Meredith Pel, NP  hydroxychloroquine (PLAQUENIL) 200 MG tablet Take 400 mg by mouth every morning.     [provider]  hyoscyamine (LEVBID) 0.375 MG 12 hr tablet Take 1 tab 3 times daily as needed for cramping and spasms. 03/07/16   Esterwood, Amy S, PA-C  levothyroxine (SYNTHROID) 100 MCG tablet Take 175 mcg by mouth daily.  02/27/17   [provider]  OMEPRAZOLE PO Take 20 mg by mouth at bedtime.     [provider]  oxyCODONE-acetaminophen (PERCOCET) 5-325 MG tablet Take 1 tablet by mouth every 4 (four) hours as needed. 03/24/18   Ocie Doyne, DDS  pilocarpine (SALAGEN) 5 MG tablet Take 1 tablet (5 mg total) by mouth 2 (two) times daily. 08/10/16   Panwala, Naitik, PA-C  QUEtiapine (SEROQUEL) 300 MG tablet Take 300 mg by mouth at bedtime.    [provider]  tiZANidine (ZANAFLEX) 4 MG tablet Take 4 mg by mouth every 8 (eight) hours as needed for muscle spasms.  09/02/13   [provider]    Family History Family History  Problem Relation Age of Onset  . Diabetes Father   . Endometriosis Mother   . Heart disease Maternal Grandmother   . Hypertension Maternal Grandmother   . Osteoporosis Maternal Grandmother   . Heart disease Maternal Grandfather   . Hypertension Maternal Grandfather   . Heart disease Paternal Grandmother   . Other Neg Hx   . Colon cancer Neg Hx   . Pancreatic cancer Neg Hx   . Stomach cancer Neg Hx     Social History Social History   Tobacco Use  . Smoking status: Never Smoker  . Smokeless tobacco: Never Used  Substance Use Topics  .  Alcohol use: No    Alcohol/week: 0.0 standard drinks  . Drug use: No     Allergies   Penicillins and Morphine and related   Review of Systems Review of Systems  Constitutional: Positive for activity change and appetite change. Negative for fever.  HENT: Negative for congestion and rhinorrhea.   Eyes: Positive for photophobia. Negative for visual disturbance.  Respiratory: Negative for cough, chest tightness and shortness of breath.   Cardiovascular:  Negative for chest pain.  Gastrointestinal: Positive for nausea. Negative for abdominal pain.  Genitourinary: Negative for dysuria, hematuria, vaginal bleeding and vaginal discharge.  Musculoskeletal: Negative for arthralgias and myalgias.  Skin: Negative for rash.  Neurological: Positive for headaches. Negative for dizziness, weakness and light-headedness.  Psychiatric/Behavioral: Negative for agitation, behavioral problems and confusion.   all other systems are negative except as noted in the HPI and PMH.    Physical Exam Updated Vital Signs BP 120/89 (BP Location: Left Arm)   Pulse 93   Temp 98.2 F (36.8 C) (Oral)   Resp 17   Ht 5\' 8"  (1.727 m)   Wt 45.4 kg   LMP 05/31/2018   SpO2 99%   BMI 15.22 kg/m   Physical Exam  Constitutional: She is oriented to person, place, and time. She appears well-developed and well-nourished. No distress.  HENT:  Head: Normocephalic and atraumatic.  Mouth/Throat: Oropharynx is clear and moist. No oropharyngeal exudate.  No frontal or maxillary sinus pain  Eyes: Pupils are equal, round, and reactive to light. Conjunctivae and EOM are normal.  Neck: Normal range of motion. Neck supple.  No meningismus.  Cardiovascular: Normal rate, regular rhythm, normal heart sounds and intact distal pulses.  No murmur heard. Pulmonary/Chest: Effort normal and breath sounds normal. No respiratory distress.  Abdominal: Soft. There is no tenderness. There is no rebound and no guarding.  Urostomy present    Musculoskeletal: Normal range of motion. She exhibits no edema or tenderness.  Neurological: She is alert and oriented to person, place, and time. No cranial nerve deficit. She exhibits normal muscle tone. Coordination normal.  No ataxia on finger to nose bilaterally. No pronator drift. 5/5 strength throughout. CN 2-12 intact.Equal grip strength. Sensation intact.   Skin: Skin is warm.  Psychiatric: She has a normal mood and affect. Her behavior is normal.  Nursing note and vitals reviewed.    ED Treatments / Results  Labs (all labs ordered are listed, but only abnormal results are displayed) Labs Reviewed - No data to display  EKG None  Radiology Ct Head Wo Contrast  Result Date: 06/08/2018 CLINICAL DATA:  26 year old female with headache. EXAM: CT HEAD WITHOUT CONTRAST TECHNIQUE: Contiguous axial images were obtained from the base of the skull through the vertex without intravenous contrast. COMPARISON:  Head CT dated 06/19/2013 FINDINGS: Evaluation is limited due to streak artifact caused by hair pins. Brain: The ventricles and sulci appropriate size for patient's age. The gray-white matter discrimination is preserved. There is no acute intracranial hemorrhage. No mass effect or midline shift. No extra-axial fluid collection. Vascular: No hyperdense vessel or unexpected calcification. Skull: Normal. Negative for fracture or focal lesion. Sinuses/Orbits: No acute finding. Other: None IMPRESSION: No acute intracranial pathology. Electronically Signed   By: Elgie Collard M.D.   On: 06/08/2018 00:05    Procedures Procedures (including critical care time)  Medications Ordered in ED Medications  ketorolac (TORADOL) 30 MG/ML injection 30 mg (has no administration in time range)  metoCLOPramide (REGLAN) injection 10 mg (has no administration in time range)  diphenhydrAMINE (BENADRYL) injection 25 mg (has no administration in time range)     Initial Impression / Assessment and Plan /  ED Course  I have reviewed the triage vital signs and the nursing notes.  Pertinent labs & imaging results that were available during my care of the patient were reviewed by me and considered in my medical decision making (see chart for details).    Gradual onset headache with  nausea and photophobia worse than previous migraines.  No thunderclap onset.  No fever.  No neurological deficits.  Toradl, reglan, benadryl and IVF given. CT head obtained given worsening severity of headache and no imaging in system.  Pain improved on recheck. CT head normal.  FROM neck without pain. No fever.  Low suspicion for SAH, meningitis, temporal arteritis.  Patient tolerating PO, ambulatory and requesting discharge.  Followup with PCP as well as neurology.  Return precautions discussed.   Final Clinical Impressions(s) / ED Diagnoses   Final diagnoses:  Bad headache    ED Discharge Orders    None       Byrd Terrero, Jeannett SeniorStephen, MD 06/08/18 [phone]

## 2018-06-07 NOTE — ED Notes (Signed)
ED Provider at bedside. 

## 2018-06-07 NOTE — ED Triage Notes (Addendum)
Pt states she woke up with a headache around 715pm today. Endorses nausea and photosensitivity. Hx of mha. Took ibuprofen around 8pm without relief

## 2018-06-08 NOTE — ED Notes (Signed)
Pt ambulated to door of treatment room- unsteady on feet. Reports dizziness. Headache improved.

## 2018-06-08 NOTE — ED Notes (Signed)
Ginger ale given

## 2018-06-08 NOTE — Discharge Instructions (Addendum)
Your CT scan is normal.  Follow-up with your doctor.  Return to the ED with worsening pain, fever, vomiting or other concerns.

## 2018-06-10 MED FILL — tiZANidine HCL 4 MG TABS: 4 | 10 days supply | Qty: 30 | Fill #0

## 2018-07-11 ENCOUNTER — Ambulatory Visit: Payer: Medicaid Other | Admitting: Registered"

## 2018-07-23 ENCOUNTER — Encounter: Payer: Self-pay | Admitting: Registered"

## 2018-07-23 ENCOUNTER — Encounter: Payer: Medicaid Other | Attending: Family Medicine | Admitting: Registered"

## 2018-07-23 DIAGNOSIS — R634 Abnormal weight loss: Secondary | ICD-10-CM | POA: Diagnosis not present

## 2018-07-23 NOTE — Patient Instructions (Addendum)
-   Aim to balance meals with protein with breakfast, lunch, and dinner.   - Follow [employer] on social nets. Check into attending support group held every 2nd Tuesday of month.

## 2018-07-23 NOTE — Progress Notes (Signed)
Medical Nutrition Therapy:  Appt start time: 9:00 end time:  10:15.   Assessment:  Primary concerns today: Pt arrives stating she is still trying to gain weight. Recent labs (05/2018) show low Fe (33) and low hemoglobin (11.7).   Pt expectations: something she can do to help gain more weight  Pt states she has had had over half intestines removed plus more to help with bladder. Pt states bladder pain has improved but she still has some stomach issues. Pt states when she eats it seems like things are in and out.  Pt reports being able to tolerate well: soup, rice, baked chicken, seafood, not a big fan of meat, fruit. Pt states she likes vegetables but typically does not tolerate them well. Pt states she also does not tolerate dairy or watermelon well. Pt states she likes yogurt and planning to try lactose-free option to see if stomach responds well.   Pt states she does not have an appetite often. Pt states she drinks protein shakes that she makes herself. Pt states she wants to gain weight to help with health; reports her weight has fluctuated since age 28. Pt states she takes MVI with iron. Pt reports history of lupus. Pt states she was following a vegan diet for a while to try and regenerate cells to start over. Pt states she does not like things that are mushy, such as oatmeal or beans. Pt states she only eats bread once a month; reports eating bread makes her feel full faster. Pt states she tries to move around a lot, resists naps during the day. Pt states she does not sleep well at night due to swelling in arms and legs. Towards the end of the appointment pt begins to share information about how she was told she her thoughts about eating may be similar to someone with anorexia.    Preferred Learning Style:   No preference indicated   Learning Readiness:   Contemplating  Ready  Change in progress   MEDICATIONS: See list   DIETARY INTAKE:  Usual eating pattern includes 3 meals and  1 snacks per day.  Everyday foods include herbal tea, sweet tea, fruit smoothies, fruit, yogurt, rice.  Avoided foods include red meat, fish, breads, and cashews.    24-hr recall:  B (6:15 AM): fruit juice + 1/2 c granola + yogurt Snk ( AM): none  L (1 PM): [employer]-grilled shrimp + hush puppies + [employer] smoothie + protein powder Snk ( PM): raspberries + chamomile tea D ( PM): pasta + tomatoes  Snk ( PM): oolong tea Beverages: herbal tea, fruit smoothies, decaf sweet tea, clear water, fruit juice, water (~60 oz)  Usual physical activity: running (with dog) 90 min, 7 days/week  Estimated energy needs: 2000-2200 calories 225-248g carbohydrates 150-165 g protein 56-61 g fat  Progress Towards Goal(s):  In progress.   Nutritional Diagnosis:  NB-1.1 Food and nutrition-related knowledge deficit As related to iron-deficiency.  As evidenced by pt verablizes incomplete knowledge.    Intervention:  Nutrition education and counseling. Pt was educated and counseled on the benefits of increasing iron intake, ways to increase iron consumption with sources other than red meat, having balanced meals, and managing health conditions. Pt expressed concern with being able to make changes and was encouraged that we will take it slow, change takes time, and we will focus on little changes over time. Pt was in agreement with goals listed.  Goals: - Aim to balance meals with protein with breakfast, lunch,  and dinner.  - Follow [employer] on social nets. Check into attending support group held every 2nd Tuesday of month.   Teaching Method Utilized:  Visual Auditory Hands on  Handouts given during visit include:  [employer]  Iron deficiency anemia nutrition therapy  Barriers to learning/adherence to lifestyle change: none identified  Demonstrated degree of understanding via:  Teach Back   Monitoring/Evaluation:  Dietary intake, exercise, and body weight in 1  month(s).

## 2018-08-07 MED FILL — SYNTHROID 150 MCG TABLET: 150 | 30 days supply | Qty: 30 | Fill #2

## 2018-08-18 ENCOUNTER — Ambulatory Visit: Payer: Medicaid Other | Admitting: Neurology

## 2018-08-18 ENCOUNTER — Telehealth: Payer: Self-pay | Admitting: *Deleted

## 2018-08-18 NOTE — Telephone Encounter (Signed)
No showed new patient appointment. 

## 2018-08-19 ENCOUNTER — Encounter: Payer: Self-pay | Admitting: Neurology

## 2018-08-28 ENCOUNTER — Encounter: Payer: Medicaid Other | Attending: Family Medicine | Admitting: Registered"

## 2018-08-28 DIAGNOSIS — R634 Abnormal weight loss: Secondary | ICD-10-CM | POA: Diagnosis present

## 2018-08-28 NOTE — Progress Notes (Signed)
Medical Nutrition Therapy:  Appt start time: 9:05 end time:  10:35.   Assessment:  Primary concerns today: Pt arrives stating she is still trying to gain weight. Recent labs (05/2018) show low Fe (33) and low hemoglobin (11.7).   Pt expectations: something she can do to help gain more weight  Pt states she has been stressed being unable to work since Jan 2020; lives within parents. Pt has 30 year old daughter.   Pt states she was sick recently and did not feel like eating much. Pt states she has been eating more and snacking more since previous visit. Pt states she had some abdominal and chest discomfort the night before; did not eat dinner. Pt states she has some chest pain/heart racing that has been consistent. Pt states she has some body aches; unsure of cause. Pt reports some dizziness/lighteheadedness mostly when going from sitting to standing, lying to standing, and/or changing positions; experiencing since 10th grade and thought it was normal. Pt states her weight has been consistent within past year 92-97 lbs; weighs often and sometimes it is multiple times a day. Pt reports LMP without hormones: 2/20  Pt reports having feeding tube from infancy to [age] years old. Pt reports restricting food since 28 years old; no spicy, no dairy, not greasy foods because it would cause her to vomit.   Medical Information:  Changes in hair, skin, nails since ED started: drier skin, some hair loss (believes it is due to medication changes) Chewing/swallowing difficulties: no Relux or heartburn: sometimes: yes, takes reflux medications  Trouble with teeth: no LMP without the use of hormones: 2/20    Weight at that point: 92-97 lbs Constipation, diarrhea: no Dizziness/lightheadedness: often Headaches/body aches: sometimes Heart racing/chest pain: sometimes Mood: stable Sleep: tired some days Focus/concentration: reports some memory challenges Cold intolerance: yes Vision changes: no  Mental health  diagnosis:   Dietary assessment: A typical day consists of 2-3 meals and 1 snack  Safe foods include: herbal tea, sweet tea, fruit smoothies, fruit, yogurt, rice Avoided foods include:red meat, fish, breads, and cashews  24 hour recall:  B (6:15 AM): 3 eggs + smoothie or chicken noodle soup or fruit juice + 1/2 c granola + yogurt Snk ( AM): none  L (1 PM): Ensure + gumbo + 6 shrimp  Snk ( PM): 8 saltines or raspberries + chamomile tea D ( PM): white rice + butter + cabbage + okra or pasta + tomatoes  Snk ( PM): oolong tea Beverages: herbal tea, fruit smoothies, decaf sweet tea, clear water, fruit juice, water (~60 oz)  Usual physical activity: running (with dog) 90 min, 7 days/week  What Methods Do You Use To Control Your Weight (Compensatory behaviors)?           Restricting (calories, fat, carbs)  Food rules or rituals (explain)   Estimated energy intake: ~1000 kcals  Estimated energy needs: 2000-2200 calories 225-248g carbohydrates 150-165 g protein 56-61 g fat  Previous visit: Pt states she has had had over half intestines removed plus more to help with bladder. Pt states bladder pain has improved but she still has some stomach issues. Pt states when she eats it seems like things are in and out.  Pt reports being able to tolerate well: soup, rice, baked chicken, seafood, not a big fan of meat, fruit. Pt states she also does not tolerate dairy or watermelon well.   Pt states she does not have an appetite often. Pt states she drinks protein shakes that  she makes herself. Pt states she wants to gain weight to help with health; reports her weight has fluctuated since age 19. Pt states she takes MVI with iron. Pt reports history of lupus. Pt states she was following a vegan diet for a while to try and regenerate cells to start over. Pt states she does not like things that are mushy, such as oatmeal or beans. Pt states she only eats bread once a month; reports eating bread makes her  feel full faster. Towards the end of the appointment pt begins to share information about how she was told she her thoughts about eating may be similar to someone with anorexia.    Preferred Learning Style:   No preference indicated   Learning Readiness:   Contemplating  Ready  Change in progress   MEDICATIONS: See list   DIETARY INTAKE:  Usual eating pattern includes 3 meals and 1 snacks per day.  Nutritional Diagnosis:  NB-1.1 Food and nutrition-related knowledge deficit As related to iron-deficiency.  As evidenced by pt verablizes incomplete knowledge.    Intervention:  Nutrition education and counseling. Pt was educated and counseled on eating to adequately nourish her body and consequences of not having meeting her daily nutritional needs. Pt expressed concern with being able to make changes and was encouraged that we will take it slow, change takes time, and we will focus on little changes over time. Discussed with pt the importance of having a therapist on the team as well to help with eating concerns. Pt was in agreement with goals listed.  Goals: - Follow [employer] on social nets. Check into attending support group held every 2nd Tuesday of month. - Have Ensure and crackers as afternoon snack in addition to 3 meals day.  - Contact therapist: Mathis Dad [phone]. Currently taking new patients and accepts Medicaid.   Teaching Method Utilized:  Visual Auditory Hands on  Handouts given during visit include:  none  Barriers to learning/adherence to lifestyle change: none identified  Demonstrated degree of understanding via:  Teach Back   Monitoring/Evaluation:  Dietary intake, exercise, and body weight in 2 week(s).

## 2018-08-28 NOTE — Patient Instructions (Addendum)
-   Follow [employer] on social nets. Check into attending support group held every 2nd Tuesday of month.  - Have Ensure and crackers as afternoon snack in addition to 3 meals day.   - Contact therapist: Mathis Dad [phone]. Currently taking new patients and accepts Medicaid.

## 2018-09-09 ENCOUNTER — Encounter: Payer: Medicaid Other | Attending: Family Medicine | Admitting: Registered"

## 2018-09-09 ENCOUNTER — Encounter: Payer: Self-pay | Admitting: Registered"

## 2018-09-09 DIAGNOSIS — R634 Abnormal weight loss: Secondary | ICD-10-CM

## 2018-09-09 NOTE — Progress Notes (Signed)
Medical Nutrition Therapy:  Appt start time: 8:31 end time:  9:02   Assessment:  Primary concerns today: Pt arrives stating she is still trying to gain weight. Recent labs (05/2018) show low Fe (33) and low hemoglobin (11.7).   Pt expectations: something she can do to help gain more weight  Pt states she has been stressed being unable to work since Jan 2020; lives within parents. Pt has 30 year old daughter.   Pt states she has been really cold and tired for the last few days. Pt states recently she was hungry shortly after eating and didn't think it was normal, ignored the feeling and went to sleep. Pt states she woke up and was still hungry then decided to eat. Pt states she initially ignored her hunger for fear of eating and causing stomach pain. Pt states she tried beef last week and vomited. Pt states she has not had beef in a while. Pt states she vomited for the next few days after that.   Pt states she will see therapist, Mathis Dad on 03/12.   Pt reports having feeding tube from infancy to 45.30 years old. Pt reports restricting food since 28 years old; no spicy, no dairy, not greasy foods because it would cause her to vomit.    Today's weight: 97.7  Medical Information:  Changes in hair, skin, nails since ED started: drier skin, some hair loss (believes it is due to medication changes) Chewing/swallowing difficulties: no Relux or heartburn: sometimes: yes, takes reflux medications  Trouble with teeth: no LMP without the use of hormones: 2/20    Weight at that point: 92-97 lbs Constipation, diarrhea: no Dizziness/lightheadedness: often Headaches/body aches: sometimes Heart racing/chest pain: sometimes Mood: stable Sleep: tired some days Focus/concentration: reports some memory challenges Cold intolerance: yes Vision changes: no  Mental health diagnosis:   Dietary assessment: A typical day consists of 2-3 meals and 1 snack  Safe foods include: herbal tea, sweet tea,  fruit smoothies, fruit, yogurt, rice Avoided foods include: red meat, fish, breads, and cashews  24 hour recall:  B (6:15 AM): 3 eggs + orange juice or smoothie (fruit, yogurt) Snk ( AM): none  L (1 PM): wild rice + 6 shrimp   Snk ( PM): Ensure + 1 pt raspberries + chamomile tea D ( PM): wild rice + 6 shrimp + green beans  Snk ( PM): none Beverages: herbal tea, fruit smoothies, decaf sweet tea, clear water, fruit juice, water (~60 oz)  Usual physical activity: none stated  What Methods Do You Use To Control Your Weight (Compensatory behaviors)?           Restricting (calories, fat, carbs)  Food rules or rituals (explain)   Estimated energy intake: ~1100-1200 kcals  Estimated energy needs: 2000-2200 calories 225-248g carbohydrates 150-165 g protein 56-61 g fat  Previous visit: Pt states she has had had over half intestines removed plus more to help with bladder. Pt states bladder pain has improved but she still has some stomach issues. Pt states when she eats it seems like things are in and out.  Pt reports being able to tolerate well: soup, rice, baked chicken, seafood, not a big fan of meat, fruit. Pt states she also does not tolerate dairy or watermelon well.   Pt states she does not have an appetite often. Pt states she drinks protein shakes that she makes herself. Pt states she wants to gain weight to help with health; reports her weight has fluctuated since age 40.  Pt states she takes MVI with iron. Pt reports history of lupus. Pt states she was following a vegan diet for a while to try and regenerate cells to start over. Pt states she does not like things that are mushy, such as oatmeal or beans. Pt states she only eats bread once a month; reports eating bread makes her feel full faster. Towards the end of the appointment pt begins to share information about how she was told she her thoughts about eating may be similar to someone with anorexia.    Nutritional Diagnosis:   NB-1.1 Food and nutrition-related knowledge deficit As related to iron-deficiency.  As evidenced by pt verablizes incomplete knowledge.    Intervention:  Nutrition education and counseling. Pt was educated and counseled on eating to adequately nourish her body. Pt expressed concern with being able to make changes and was encouraged that we will take it slow and change takes time. Pt was in agreement with goals listed.  Goals: - Follow [employer] on social nets. Check into attending support group held every 2nd Tuesday of month at 6:30pm.   [facility] (Room 206)  [address]   [place], [place] - Continue great work having 3 meals and afternoon snack daily.   Teaching Method Utilized:  Visual Auditory Hands on  Handouts given during visit include:  none  Barriers to learning/adherence to lifestyle change: none identified  Demonstrated degree of understanding via:  Teach Back   Monitoring/Evaluation:  Dietary intake, exercise, and body weight in 2 week(s).

## 2018-09-09 NOTE — Patient Instructions (Signed)
-   Follow [employer] on social nets. Check into attending support group held every 2nd Tuesday of month at 6:30pm.   [facility] (Room 206)  [address]   [place], [place]  - Continue great work having 3 meals and afternoon snack daily.

## 2018-09-18 MED FILL — SYNTHROID 150 MCG TABLET: 150 | 30 days supply | Qty: 30 | Fill #3 | Status: TO

## 2018-09-22 ENCOUNTER — Telehealth: Payer: Self-pay | Admitting: *Deleted

## 2018-09-22 NOTE — Telephone Encounter (Signed)
I left patient a message letting her know we are canceling her in-office appt due to Covid-19 precautions.  However, we understand that she has medical needs and we can offer several options to her:  1) face-to-face virtual visit (most desired)  2) telephone visit (if no availability to computer or smart phone)  3) reschedule in office visit at a later date.   Provided our number to call back to let us know how she would like to proceed.

## 2018-09-23 ENCOUNTER — Ambulatory Visit: Payer: Medicaid Other | Admitting: Neurology

## 2018-09-30 ENCOUNTER — Other Ambulatory Visit: Payer: Self-pay

## 2018-09-30 ENCOUNTER — Encounter: Payer: Medicaid Other | Admitting: Registered"

## 2018-09-30 DIAGNOSIS — R634 Abnormal weight loss: Secondary | ICD-10-CM

## 2018-09-30 DIAGNOSIS — F509 Eating disorder, unspecified: Secondary | ICD-10-CM

## 2018-09-30 NOTE — Patient Instructions (Addendum)
-   Having conversation with parents about moving scale into their bedroom.  - Decrease weighing to once a week.   - Challenge weight thoughts. Remember we eat to nourish our bodies.   - Have Ensure at least twice a day.

## 2018-09-30 NOTE — Progress Notes (Signed)
Medical Nutrition Therapy:  Appt start time: 8:38 end time: 9:27   Assessment:  Primary concerns today: Pt arrives stating she is still trying to gain weight. Recent labs (05/2018) show low Fe (33) and low hemoglobin (11.7).   Pt expectations: something she can do to help gain more weight  Pt states she has been stressed being unable to work since Jan 2020; lives within parents. Pt has 30 year old daughter.   Pt states eating has varied since previous visit. Some days its good and others she does not eat much. Pt states stomach was hurting yesterday. Pt states she weighs herself about 6 times/day. Scale is near kitchen in parents home and everyone weighs themselves throughout the day. Pt states scale announces the weight audibly and others are able to hear it which can become a group discussion. Pt states parents will ask what the scale said when she or her daughter steps on it. Pt states parents comment about how they need to lose weight but pt needs to gain weight. States daughter talks about losing weight and working out with grandma sometimes. Pt states her weight goal is 115 lbs; reached 103 lbs a few weeks ago and has become discouraged seeing the numbers decrease since then. Pt mentions her extended family will force her to eat when visiting them.   Pt reports having feeding tube from infancy to [age] years old. Pt reports restricting food since 28 years old; no spicy, no dairy, not greasy foods because it would cause her to vomit.    Today's weight: 97.3 (consistent from 97.7 on 09/09/2018)  Medical Information:  Changes in hair, skin, nails since ED started: drier skin, some hair loss (believes it is due to medication changes) Chewing/swallowing difficulties: no Relux or heartburn: sometimes: yes, takes reflux medications  Trouble with teeth: no LMP without the use of hormones: 2/20    Weight at that point: 92-97 lbs Constipation, diarrhea: no Dizziness/lightheadedness:  often Headaches/body aches: sometimes Heart racing/chest pain: sometimes Mood: stable Sleep: tired some days Focus/concentration: reports some memory challenges Cold intolerance: yes Vision changes: no  Mental health diagnosis:   Dietary assessment: A typical day consists of 2-3 meals and 1 snack  Safe foods include: herbal tea, sweet tea, fruit smoothies, fruit, yogurt, rice Avoided foods include: red meat, fish, breads, and cashews  24 hour recall:  B (6:15 AM): 4 eggs or smoothie (fruit, yogurt) + oatmeal cookie Snk ( AM): none L (1 PM): sometimes skips; wild rice + 6 shrimp   Snk ( PM): chips + tea D ( PM): okra + spinach + bbq chicken + rice Snk ( PM): 1 c Sprite Beverages: herbal tea, fruit smoothies, decaf sweet tea, clear water, fruit juice, water (~60 oz)  Usual physical activity: none stated  What Methods Do You Use To Control Your Weight (Compensatory behaviors)?           Restricting (calories, fat, carbs)  Food rules or rituals-eats to gain weight, weighs about 6  times/day to see how weight fluctuates   Estimated energy intake: ~1000 kcals  Estimated energy needs: 2000-2200 calories 225-248g carbohydrates 150-165 g protein 56-61 g fat  Previous visit: Pt states she has had had over half intestines removed plus more to help with bladder. Pt states bladder pain has improved but she still has some stomach issues. Pt states when she eats it seems like things are in and out.  Pt reports being able to tolerate well: soup, rice, baked chicken, seafood,  not a big fan of meat, fruit. Pt states she also does not tolerate dairy or watermelon well.   Nutritional Diagnosis:  NB-1.5 Disordered eating pattern As related to skipping meals.  As evidenced by dietary recall.    Intervention:  Nutrition education and counseling. Pt was educated and counseled on eating to adequately nourish her body. Discussed how to challenge thoughts related to weight and diet culture.  Discussed how to weigh less often and its benefits. Pt was in agreement with goals listed.  Goals: - Having conversation with parents about moving scale into their bedroom. - Decrease weighing to once a week.  - Challenge weight thoughts. Remember we eat to nourish our bodies.  - Have Ensure at least twice a day.   Teaching Method Utilized:  Visual Auditory Hands on  Handouts given during visit include:  none  Barriers to learning/adherence to lifestyle change: none identified  Demonstrated degree of understanding via:  Teach Back   Monitoring/Evaluation:  Dietary intake, exercise, and body weight in 2 week(s).

## 2018-10-14 ENCOUNTER — Encounter: Payer: Medicaid Other | Attending: Family Medicine | Admitting: Registered"

## 2018-10-14 ENCOUNTER — Other Ambulatory Visit: Payer: Self-pay

## 2018-10-14 DIAGNOSIS — R634 Abnormal weight loss: Secondary | ICD-10-CM | POA: Diagnosis present

## 2018-10-14 DIAGNOSIS — F509 Eating disorder, unspecified: Secondary | ICD-10-CM

## 2018-10-14 NOTE — Progress Notes (Signed)
Medical Nutrition Therapy:  Appt start time: 10:03 end time: 10:50  Patient was seen on 10/14/2018 for nutrition counseling pertaining to disordered eating  Primary care provider: Rueben Bash, PA Therapist: Mathis Dad   ROI: 10/14/2018 Any other medical team members: none   Assessment:  Primary concerns today: Pt arrives stating she is still trying to gain weight. Recent labs (05/2018) show low Fe (33) and low hemoglobin (11.7).   Pt expectations: something she can do to help gain more weight  Pt states she has been stressed being unable to work since Jan 2020; lives within parents. Pt has 54 year old daughter.   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 Pt states she has been having smoothies but not has not had a chance to go to the store to get Ensure. Pt states she eats more and feels hungry more, feels like she's eating a lot. Pt reports drinking 2 bottles of water yesterday morning instead of eating because she didn't want figure out what to eat. Pt reports breakfast hurts her stomach when she eats it and feels some swelling when eating. Pt states she may feel nauseous or have headache in the morning, deters from eating breakfast. Also does not like wasting food. Pt states she divides food evenly (6 shrimp at a time) because its the amount of shrimp that fit on skewer. Pt states she has contemplates hunger, thirst, or if she just wants something to eat. Pt states she has not been checking her weight. Parents have hidden scale from her. Went out of town, stepped on scale, but got off of it before looking at the number.   Pt mentions her extended family will force her to eat when visiting them. Pt reports having feeding tube from infancy to 66.30 years old. Pt reports restricting food since 28 years old; no spicy, no dairy, not greasy foods because it would cause her to vomit.    Today's weight: 100.3 (3 lbs increase since previous visit  97.3 on 09/30/2018)  Medical  Information:  Changes in hair, skin, nails since ED started: drier skin, some hair loss (believes it is due to medication changes) Chewing/swallowing difficulties: no Relux or heartburn: sometimes: yes, takes reflux medications  Trouble with teeth: no LMP without the use of hormones: 2/20    Weight at that point: 92-97 lbs Constipation, diarrhea: no Dizziness/lightheadedness: often Headaches/body aches: sometimes Heart racing/chest pain: sometimes Mood: stable Sleep: tired some days Focus/concentration: reports some memory challenges Cold intolerance: yes Vision changes: no  Mental health diagnosis:   Dietary assessment: A typical day consists of 2-3 meals and 1 snack  Safe foods include: herbal tea, sweet tea, fruit smoothies, fruit, yogurt, rice, shrimp Avoided foods include: red meat, fish, breads, and cashews  24 hour recall:  B (6:15 AM): 32 oz water or juice + cup of chicken noodle soup or skips  Snk ( AM): none L (1:30 am): 12 grilled shrimp + 1/3 c rice + 1c green beans  Snk ( PM): none D ( PM): 12 grilled shrimp S: popsicle  Beverages: herbal tea, Sprite (24 oz), fruit smoothies, decaf sweet tea, clear water, fruit juice, water (~32 oz)  Usual physical activity: none stated  What Methods Do You Use To Control Your Weight (Compensatory behaviors)?           Restricting (calories, fat, carbs)  Food rules or rituals-eats to gain weight, weighs about 6  times/day to see how weight fluctuates   Estimated energy intake: ~1000  kcals  Estimated energy needs: 2000-2200 calories 225-248g carbohydrates 150-165 g protein 56-61 g fat  Previous visit: Pt states she has had had over half intestines removed plus more to help with bladder. Pt states bladder pain has improved but she still has some stomach issues. Pt states when she eats it seems like things are in and out.  Pt reports being able to tolerate well: soup, rice, baked chicken, seafood, not a big fan of meat,  fruit. Pt states she also does not tolerate dairy or watermelon well.   Nutritional Diagnosis:  NB-1.5 Disordered eating pattern As related to skipping meals.  As evidenced by dietary recall.    Intervention:  Nutrition education and counseling. Discussed the discomfort she may experience when eating due to gastroparesis. Pt was educated and counseled on eating to adequately nourish her body. Encouraged to keep up the great work challenging weight thoughts and weighing at home. Discussed body needs nourishment along with trusting and honoring hunger signals. Pt was in agreement with goals listed.  Goals: - Have Ensure/Boost/Carnation Instant Breakfast as meal option instead of skipping meals.  - Awesome job with decreasing how often you're weighing yourself and challenging weight thoughts. I am proud of you!  Teaching Method Utilized:  Visual Auditory Hands on  Handouts given during visit include:  none  Barriers to learning/adherence to lifestyle change: none identified  Demonstrated degree of understanding via:  Teach Back   Monitoring/Evaluation:  Dietary intake, exercise, and body weight in 2 week(s).

## 2018-10-14 NOTE — Patient Instructions (Signed)
-   Have Ensure/Boost/Carnation Instant Breakfast as meal option instead of skipping meals.   - Awesome job with decreasing how often you're weighing yourself and challenging weight thoughts. I am proud of you!

## 2018-10-17 MED FILL — SYNTHROID 150 MCG TABLET: 150 | 30 days supply | Qty: 30 | Fill #0

## 2018-10-28 ENCOUNTER — Ambulatory Visit: Payer: Medicaid Other | Admitting: Registered"

## 2018-11-05 ENCOUNTER — Encounter: Payer: Self-pay | Admitting: Registered"

## 2018-11-05 ENCOUNTER — Encounter: Payer: Medicaid Other | Attending: Family Medicine | Admitting: Registered"

## 2018-11-05 ENCOUNTER — Inpatient Hospital Stay: Payer: Medicaid Other | Attending: Hematology and Oncology

## 2018-11-05 ENCOUNTER — Other Ambulatory Visit: Payer: Self-pay

## 2018-11-05 DIAGNOSIS — R634 Abnormal weight loss: Secondary | ICD-10-CM | POA: Diagnosis not present

## 2018-11-05 DIAGNOSIS — F509 Eating disorder, unspecified: Secondary | ICD-10-CM

## 2018-11-05 DIAGNOSIS — D500 Iron deficiency anemia secondary to blood loss (chronic): Secondary | ICD-10-CM | POA: Diagnosis not present

## 2018-11-05 LAB — CBC WITH DIFFERENTIAL (CANCER CENTER ONLY)
Abs Immature Granulocytes: 0.02 10*3/uL (ref 0.00–0.07)
Basophils Absolute: 0 10*3/uL (ref 0.0–0.1)
Basophils Relative: 0 %
Eosinophils Absolute: 0.1 10*3/uL (ref 0.0–0.5)
Eosinophils Relative: 1 %
HCT: 37.3 % (ref 36.0–46.0)
Hemoglobin: 12.3 g/dL (ref 12.0–15.0)
Immature Granulocytes: 0 %
Lymphocytes Relative: 30 %
Lymphs Abs: 1.5 10*3/uL (ref 0.7–4.0)
MCH: 29.9 pg (ref 26.0–34.0)
MCHC: 33 g/dL (ref 30.0–36.0)
MCV: 90.5 fL (ref 80.0–100.0)
Monocytes Absolute: 0.3 10*3/uL (ref 0.1–1.0)
Monocytes Relative: 6 %
Neutro Abs: 3.2 10*3/uL (ref 1.7–7.7)
Neutrophils Relative %: 63 %
Platelet Count: 180 10*3/uL (ref 150–400)
RBC: 4.12 MIL/uL (ref 3.87–5.11)
RDW: 12.9 % (ref 11.5–15.5)
WBC Count: 5 10*3/uL (ref 4.0–10.5)
nRBC: 0 % (ref 0.0–0.2)

## 2018-11-05 NOTE — Progress Notes (Signed)
Medical Nutrition Therapy:  Appt start time: 9:35 end time: 10:27  Patient was seen on 11/05/2018 for nutrition counseling pertaining to disordered eating  Primary care provider: Rueben Bash, PA Therapist: Mathis Dad   ROI: 10/14/2018 Any other medical team members: none   Assessment:  Primary concerns today: Pt arrives stating she is still trying to gain weight. Recent labs (05/2018) show low Fe (33) and low hemoglobin (11.7).   Pt expectations: something she can do to help gain more weight  Pt states she has been stressed being unable to work since Jan 2020; lives with parents. Pt has 39 year old daughter.   Pt states she has been eating things she normally doesn't eat. Tried sweet potato and beef liver. States she didn't like it but just ate it when she was visiting family. Pt states she eats things based on how she feels.   Pt reports family history of hypertension and diabetes. Pt states she will try to have daughter in to see dietitian. Expresses concern because daughter eats consistently throughout the day and has pica. Also expresses concern for daughter and not having her own space from her.   Pt states she has diarrhea after eating and not sure if she should just get used it or if there is something that can be done about that. Pt verbalizes she is trying and knows the importance of eating to nourish her body.   Pt mentions her extended family will force her to eat when visiting them. Pt reports having feeding tube from infancy to [age] years old. Pt reports restricting food since 28 years old; no spicy, no dairy, no greasy foods because it would cause her to vomit.    Today's weight: 97.0 (3 lbs decrease since previous visit  100.3 on 10/14/2018)  Medical Information:  Changes in hair, skin, nails since ED started: drier skin, some hair loss (believes it is due to medication changes) Chewing/swallowing difficulties: no Relux or heartburn: sometimes: yes, takes reflux  medications  Trouble with teeth: no LMP without the use of hormones: 2/20  Weight at that point: 92-97 lbs Constipation, diarrhea: no Dizziness/lightheadedness: often Headaches/body aches: sometimes Heart racing/chest pain: sometimes Mood: stable Sleep: tired some days Focus/concentration: reports some memory challenges Cold intolerance: yes Vision changes: no  Mental health diagnosis:   Dietary assessment: A typical day consists of 2-3 meals and 1 snack  Safe foods include: herbal tea, sweet tea, fruit smoothies, fruit, yogurt, rice, shrimp Avoided foods include: red meat, fish, breads, and cashews  24 hour recall:  B (6:15 AM): Ensure or 32 oz water or juice + cup of chicken noodle soup or skips  Snk ( AM): none L (1:30 am): 12 grilled shrimp + 1/3 c rice + 1c green beans  Snk ( PM): Ensure D ( PM): 12 grilled shrimp S: Ensure  Beverages: herbal tea, Sprite (24 oz), fruit smoothies, decaf sweet tea, clear water, fruit juice, water (~32 oz)  Usual physical activity: none stated  What Methods Do You Use To Control Your Weight (Compensatory behaviors)?           Restricting (calories, fat, carbs)  Food rules or rituals-eats to gain weight, weighs about 6  times/day to see how weight fluctuates   Estimated energy intake: ~1000 kcals  Estimated energy needs: 2000-2200 calories 225-248g carbohydrates 150-165 g protein 56-61 g fat  Previous visit: Pt states she has had had over half intestines removed plus more to help with bladder. Pt states bladder pain  has improved but she still has some stomach issues. Pt states when she eats it seems like things are in and out.  Pt reports being able to tolerate well: soup, rice, baked chicken, seafood, not a big fan of meat, fruit. Pt states she also does not tolerate dairy or watermelon well.   Nutritional Diagnosis:  NB-1.5 Disordered eating pattern As related to skipping meals.  As evidenced by dietary recall.    Intervention:   Nutrition education and counseling. Encouraged to keep up the great work challenging weight thoughts and weighing at home. Encouraged to continue reminding self of body needing nourishment along with trusting and honoring hunger signals. Pt was in agreement with goals listed.  Goals: - Continue to embrace thoughts of eating to nourish your body. - Continue having Ensure 3 times/day.  Teaching Method Utilized:  Visual Auditory Hands on  Handouts given during visit include:  none  Barriers to learning/adherence to lifestyle change: none identified  Demonstrated degree of understanding via:  Teach Back   Monitoring/Evaluation:  Dietary intake, exercise, and body weight in 2 week(s).

## 2018-11-05 NOTE — Patient Instructions (Addendum)
-   Continue to embrace thoughts of eating to nourish your body.  - Continue having Ensure 3 times/day.

## 2018-11-06 LAB — FERRITIN: Ferritin: 189 ng/mL (ref 11–307)

## 2018-11-06 LAB — IRON AND TIBC
Iron: 68 ug/dL (ref 41–142)
Saturation Ratios: 27 % (ref 21–57)
TIBC: 255 ug/dL (ref 236–444)
UIBC: 187 ug/dL (ref 120–384)

## 2018-11-06 NOTE — Assessment & Plan Note (Signed)
Patient has had heavy menstrual bleeding accompanied by urinary tract bleeding from chronic interstitial cystitis. She has been on oral iron therapy since 2012 although at times she was taken off iron supplementation. Since February she has been anemic with a hemoglobin of 10.6-10.9. In spite of being on oral iron treatment her hemoglobin has remained low. She continuedto have symptoms related to iron deficiency with fatigue, ice craving, headaches etc. all of these symptoms improved with IV iron treatment given July 2017  Treatment summary: IV iron given July 2017, May 2019  Lab review:11/05/2018: Hemoglobin 12.3, MCV 90.5 Iron studies pending  Based on these results she does not need any IV iron. Return to clinic in 1 year with labs and follow-up.  If at that time iron levels are stable then she will not need to see [place].

## 2018-11-07 ENCOUNTER — Inpatient Hospital Stay: Payer: Medicaid Other

## 2018-11-10 ENCOUNTER — Telehealth: Payer: Self-pay | Admitting: Hematology and Oncology

## 2018-11-10 NOTE — Telephone Encounter (Signed)
Called Patient regarding upcoming Webex appointment, patient is notified and e-mail sent.

## 2018-11-10 NOTE — Progress Notes (Signed)
HEMATOLOGY-ONCOLOGY WEBEX VISIT PROGRESS NOTE  I connected with [name] on 11/11/2018 at  9:15 AM EDT by Webex video conference and verified that I am speaking with the correct person using two identifiers.  I discussed the limitations, risks, security and privacy concerns of performing an evaluation and management service by Webex and the availability of in person appointments.  I also discussed with the patient that there may be a patient responsible charge related to this service. The patient expressed understanding and agreed to proceed.  Patient's Location: Home Physician Location: Clinic  CHIEF COMPLIANT: Follow-up of iron deficiency anemia due to chronic blood loss  INTERVAL HISTORY: [name] is a 28 y.o. female with above-mentioned history of iron deficiency anemia due to chronic blood loss. I saw her last 6 months ago. Her most recent labs from 11/05/18 showed: Hg 12.3, hematocrit 37.3, iron saturation 27%, TIBC 255, and ferritin 189. She presents today over Webex to review her recent lab work.   REVIEW OF SYSTEMS:   Constitutional: Denies fevers, chills or abnormal weight loss Eyes: Denies blurriness of vision Ears, nose, mouth, throat, and face: Denies mucositis or sore throat Respiratory: Denies cough, dyspnea or wheezes Cardiovascular: Denies palpitation, chest discomfort Gastrointestinal:  Denies nausea, heartburn or change in bowel habits Skin: Denies abnormal skin rashes Lymphatics: Denies new lymphadenopathy or easy bruising Neurological:Denies numbness, tingling or new weaknesses Behavioral/Psych: Mood is stable, no new changes  Extremities: No lower extremity edema Breast: denies any pain or lumps or nodules in either breasts All other systems were reviewed with the patient and are negative.  Observations/Objective:  There were no vitals filed for this visit. There is no height or weight on file to calculate BMI.  I have reviewed the data as  listed CMP Latest Ref Rng & Units 07/24/2017 03/08/2017 03/07/2016  Glucose 70 - 99 mg/dL 384(T) 74 88  BUN 6 - 23 mg/dL 11 8 11   Creatinine 0.40 - 1.20 mg/dL 3.64 6.80 3.21  Sodium 135 - 145 mEq/L 139 137 139  Potassium 3.5 - 5.1 mEq/L 3.4(L) 3.5 3.7  Chloride 96 - 112 mEq/L 109 105 107  CO2 19 - 32 mEq/L 17(L) 24 28  Calcium 8.4 - 10.5 mg/dL 9.3 9.8 9.0  Total Protein 6.0 - 8.3 g/dL 7.5 - -  Total Bilirubin 0.2 - 1.2 mg/dL 2.2(Q) - -  Alkaline Phos 39 - 117 U/L 57 - -  AST 0 - 37 U/L 19 - -  ALT 0 - 35 U/L 18 - -    Lab Results  Component Value Date   WBC 5.0 11/05/2018   HGB 12.3 11/05/2018   HCT 37.3 11/05/2018   MCV 90.5 11/05/2018   PLT 180 11/05/2018   NEUTROABS 3.2 11/05/2018      Assessment Plan:  Iron deficiency anemia due to chronic blood loss Patient has had heavy menstrual bleeding accompanied by urinary tract bleeding from chronic interstitial cystitis. She has been on oral iron therapy since 2012 although at times she was taken off iron supplementation. Since February she has been anemic with a hemoglobin of 10.6-10.9. In spite of being on oral iron treatment her hemoglobin has remained low. She continuedto have symptoms related to iron deficiency with fatigue, ice craving, headaches etc. all of these symptoms improved with IV iron treatment given July 2017  Treatment summary: IV iron given July 2017, May 2019, November 2019  Lab review:11/05/2018: Hemoglobin 12.3, MCV 90.5 Iron studies: Ferritin 189, TIBC 255, iron saturation 27%  Based on these results she does not need any IV iron. Return to clinic in 6 months with labs and follow-up.   I discussed the assessment and treatment plan with the patient. The patient was provided an opportunity to ask questions and all were answered. The patient agreed with the plan and demonstrated an understanding of the instructions. The patient was advised to call back or seek an in-person evaluation if the symptoms worsen or if  the condition fails to improve as anticipated.   I provided 11 minutes of face-to-face Web Ex time during this encounter.    Jamie SousVinay K Andra Heslin, MD 11/11/2018    I, [name], am acting as scribe for Serena CroissantVinay Martika Egler, MD.  I have reviewed the above documentation for accuracy and completeness, and I agree with the above.

## 2018-11-11 ENCOUNTER — Inpatient Hospital Stay (HOSPITAL_BASED_OUTPATIENT_CLINIC_OR_DEPARTMENT_OTHER): Payer: Medicaid Other | Admitting: Hematology and Oncology

## 2018-11-11 DIAGNOSIS — D500 Iron deficiency anemia secondary to blood loss (chronic): Secondary | ICD-10-CM | POA: Diagnosis not present

## 2018-11-13 ENCOUNTER — Telehealth: Payer: Self-pay | Admitting: Hematology and Oncology

## 2018-11-13 NOTE — Telephone Encounter (Signed)
TRIED to reach regarding schedule °

## 2018-12-02 ENCOUNTER — Other Ambulatory Visit: Payer: Self-pay

## 2018-12-02 ENCOUNTER — Encounter: Payer: Self-pay | Admitting: Registered"

## 2018-12-02 ENCOUNTER — Encounter: Payer: Medicaid Other | Attending: Family Medicine | Admitting: Registered"

## 2018-12-02 ENCOUNTER — Ambulatory Visit: Payer: Medicaid Other | Admitting: Registered"

## 2018-12-02 DIAGNOSIS — R634 Abnormal weight loss: Secondary | ICD-10-CM | POA: Insufficient documentation

## 2018-12-02 DIAGNOSIS — F509 Eating disorder, unspecified: Secondary | ICD-10-CM

## 2018-12-02 NOTE — Patient Instructions (Signed)
-   Try to have crackers and broth to help with intake.

## 2018-12-02 NOTE — Progress Notes (Signed)
Medical Nutrition Therapy:  Appt start time: 9:00 end time: 10:00  Patient was seen on 12/02/2018 for nutrition counseling pertaining to disordered eating  Primary care provider: Rueben Bash, PA Therapist: Mathis Dad   ROI: 10/14/2018 Any other medical team members: none   Assessment:  Primary concerns today: Pt arrives stating she is still trying to gain weight. Recent labs (05/2018) show low Fe (33) and low hemoglobin (11.7).   Pt expectations: something she can do to help gain more weight  Pt states she has been stressed being unable to work since Jan 2020; lives with parents. Pt has 35 year old daughter.   Pt states she was vomiting yesterday. Had water and vomited. Had ginger tea to settle stomach and vomited. Has contacted GI doctor, no response yet. Pt states she was taking MVI and supplements prior to last GI procedure and still vitamin deficient. Was deficient prior to procedures due to absorption and diarrhea. Due to large part of intestines being removed. Pt reports being deficient in B vitamins, fat-soluble vitamins, zinc, and selenium.   Pt speaks about daughter's eating happiness and how she eats things that are sticky like macaroni and cheese. Stickiness worries her and thinks it sticks to you. Reports how eating vegetables with salad dressing defeats the purpose of eating them because its "adding up".    Pt reports having feeding tube from infancy to 41.30 years old. Pt reports restricting food since 28 years old; no spicy, no dairy, no greasy foods because it would cause her to vomit.    Today's weight: 97.0 (3 lbs decrease since previous visit  100.3 on 10/14/2018)  Medical Information:  Changes in hair, skin, nails since ED started: drier skin, some hair loss (believes it is due to medication changes) Chewing/swallowing difficulties: no Relux or heartburn: sometimes: yes, takes reflux medications  Trouble with teeth: no LMP without the use of hormones:  2/20  Weight at that point: 92-97 lbs Constipation, diarrhea: no Dizziness/lightheadedness: often Headaches/body aches: sometimes Heart racing/chest pain: sometimes Mood: stable Sleep: tired some days Focus/concentration: reports some memory challenges Cold intolerance: yes Vision changes: no  Mental health diagnosis:   Dietary assessment: A typical day consists of 2-3 meals and 1 snack  Safe foods include: herbal tea, sweet tea, fruit smoothies, fruit, yogurt, rice, shrimp Avoided foods include: red meat, fish, breads, and cashews  24 hour recall:  B (6:15 AM): Ensure or 32 oz water or juice + cup of chicken noodle soup or skips  Snk ( AM): none L (1:30 am): 12 grilled shrimp + 1/3 c rice + 1c green beans  Snk ( PM): Ensure D ( PM): 3 oz steak + broccoli or 12 grilled shrimp S: Ensure  Beverages: herbal tea, Sprite (24 oz), fruit smoothies, decaf sweet tea, clear water, fruit juice, water (~32 oz)  Usual physical activity: none stated  What Methods Do You Use To Control Your Weight (Compensatory behaviors)?           Restricting (calories, fat, carbs)  Food rules or rituals-eats to gain weight, weighs about 6  times/day to see how weight fluctuates   Estimated energy intake: ~1000 kcals  Estimated energy needs: 2000-2200 calories 225-248g carbohydrates 150-165 g protein 56-61 g fat  Previous visit: Pt states she has had had over half intestines removed plus more to help with bladder. Pt states bladder pain has improved but she still has some stomach issues. Pt states when she eats it seems like things are in and  out.  Pt reports being able to tolerate well: soup, rice, baked chicken, seafood, not a big fan of meat, fruit. Pt states she also does not tolerate dairy or watermelon well.   Nutritional Diagnosis:  NB-1.5 Disordered eating pattern As related to skipping meals.  As evidenced by dietary recall.    Intervention:  Nutrition education and counseling.  Discussed thoughts related to categorizing foods and ways to try and nourish body while feeling nauseous. Pt was in agreement with goals listed.  Goals: - Try to have crackers and broth to help with intake.   Teaching Method Utilized:  Visual Auditory Hands on  Handouts given during visit include:  none  Barriers to learning/adherence to lifestyle change: none identified  Demonstrated degree of understanding via:  Teach Back   Monitoring/Evaluation:  Dietary intake, exercise, and body weight in 2 week(s).

## 2018-12-16 ENCOUNTER — Ambulatory Visit: Payer: Medicaid Other | Admitting: Registered"

## 2018-12-30 MED FILL — SYNTHROID 150 MCG TABLET: 150 | 30 days supply | Qty: 30 | Fill #0

## 2019-01-01 ENCOUNTER — Other Ambulatory Visit: Payer: Self-pay

## 2019-01-01 ENCOUNTER — Encounter: Payer: Medicaid Other | Attending: Family Medicine | Admitting: Registered"

## 2019-01-01 ENCOUNTER — Encounter: Payer: Self-pay | Admitting: Registered"

## 2019-01-01 DIAGNOSIS — F509 Eating disorder, unspecified: Secondary | ICD-10-CM

## 2019-01-01 DIAGNOSIS — R634 Abnormal weight loss: Secondary | ICD-10-CM | POA: Diagnosis present

## 2019-01-01 NOTE — Patient Instructions (Addendum)
-   Take Lomotil 30 min prior to eating meals.   - Have smoothies only once a day.   - Replace evening smoothie with Ensure.   - Keep up the great work eating throughout the day!

## 2019-01-01 NOTE — Progress Notes (Signed)
Medical Nutrition Therapy:  Appt start time: 8:00 end time: 8:56  Patient was seen on 01/01/2019 for nutrition counseling pertaining to disordered eating  Primary care provider: Rueben BashBreejante Bryan, [name] Therapist: Mathis DadBrett Bryan   ROI: 10/14/2018 Any other medical team members: none   Assessment:  Primary concerns today: Pt arrives stating she is still trying to gain weight. Recent labs (05/2018) show low Fe (33) and low hemoglobin (11.7).   Pt expectations: something she can do to help gain more weight  Pt arrives stating no changes to her medications. Reports taking Synthroid at 6am, Lomotil 9am, and other medications 11:30am. Later in conversation, pt states she has run out of medications and changed pharmacies related to Lomotil. Unclear if pt is taking medications as prescribed or not. Pt states she will look into having prescriptions sent to same pharmacy. Pt expresses difficulty in having prescriptions refilled due to finances.   Pt states she has been eating and then immediately having diarrhea-like stool. Reports she has been taking phenergan this week to help with decreasing nausea. Reports vomiting during weeks prior.   Previous appts: Pt states she lives with parents and 51107 year old daughter. Pt states she was taking MVI and supplements prior to last GI procedure and still vitamin deficient. Was deficient prior to procedures due to absorption and diarrhea. Due to large part of intestines being removed. Pt reports being deficient in B vitamins, fat-soluble vitamins, zinc, and selenium.   Pt speaks about daughter's eating habits and how she eats things that are sticky like macaroni and cheese. Stickiness worries her and thinks it sticks to you. Reports how eating vegetables with salad dressing defeats the purpose of eating them because its "adding up".    Pt reports having feeding tube from infancy to [age] years old. Pt reports restricting food since 28 years old; no spicy, no dairy, no  greasy foods because it would cause her to vomit.    Today's weight: 97.0 (3 lbs consistent with previous visit on 12/02/2018)  Medical Information:  Changes in hair, skin, nails since ED started: drier skin, some hair loss (believes it is due to medication changes) Chewing/swallowing difficulties: no Relux or heartburn: sometimes: yes, takes reflux medications  Trouble with teeth: no LMP without the use of hormones: 2/20  Weight at that point: 92-97 lbs Constipation, diarrhea: no Dizziness/lightheadedness: often Headaches/body aches: sometimes Heart racing/chest pain: sometimes Mood: stable Sleep: tired some days Focus/concentration: reports some memory challenges Cold intolerance: yes Vision changes: no  Mental health diagnosis:   Dietary assessment: A typical day consists of 2-3 meals and 1 snack  Safe foods include: herbal tea, sweet tea, fruit smoothies, fruit, yogurt, rice, shrimp Avoided foods include: red meat, fish, bread, and cashews  24 hour recall:  B (6:15 AM): smoothie (strawberries, peaches, 2 scoops of protein powder, strawberry/watermelon juice, ice)  Snk ( AM): none L (1:30 am): chicken pasta + broccoli  Snk ( PM): none D ( PM): 2 chicken wings + smoothie (strawberries, peaches, 1 scoop of protein powder, strawberry/watermelon juice, ice)  S: none  Beverages: water (64 oz), fruit smoothies  Usual physical activity: none stated  What Methods Do You Use To Control Your Weight (Compensatory behaviors)?           Restricting (calories, fat, carbs)  Food rules or rituals-eats to gain weight, weighs about 6  times/day to see how weight fluctuates   Estimated energy intake: ~1300-1400 kcals  Estimated energy needs: 2000-2200 calories 225-248g carbohydrates 150-165 g  protein 56-61 g fat  Previous visit: Pt states she has had had over half intestines removed plus more to help with bladder. Pt states bladder pain has improved but she still has some  stomach issues. Pt states when she eats it seems like things are in and out.  Pt reports being able to tolerate well: soup, rice, baked chicken, seafood, not a big fan of meat, fruit. Pt states she also does not tolerate dairy or watermelon well.   Nutritional Diagnosis:  NB-1.5 Disordered eating pattern As related to skipping meals.  As evidenced by dietary recall.    Intervention:  Nutrition education and counseling. Pt has increased nutritional intake. Encouraged to continue eating at least 3 meals a day. Discussed digestion, role of medication Lomotil and finances related to getting medications. Discussed the effect of sugar-sweetened beverages/items in relation to SBS. Pt was in agreement with goals listed.  Goals: - Take Lomotil 30 min prior to eating meals.  - Have smoothies only once a day.  - Replace evening smoothie with Ensure.  - Keep up the great work eating throughout the day!  Teaching Method Utilized:  Visual Auditory Hands on  Handouts given during visit include:  none  Barriers to learning/adherence to lifestyle change: none identified  Demonstrated degree of understanding via:  Teach Back   Monitoring/Evaluation:  Dietary intake, exercise, and body weight in 2 week(s).

## 2019-01-05 MED FILL — HYDROCORTISONE 10 MG TABLET: 10 | 7 days supply | Qty: 7 | Fill #0

## 2019-01-15 ENCOUNTER — Other Ambulatory Visit: Payer: Self-pay

## 2019-01-15 ENCOUNTER — Encounter: Payer: Self-pay | Admitting: Registered"

## 2019-01-15 ENCOUNTER — Encounter: Payer: Medicaid Other | Admitting: Registered"

## 2019-01-15 DIAGNOSIS — F509 Eating disorder, unspecified: Secondary | ICD-10-CM

## 2019-01-15 DIAGNOSIS — R634 Abnormal weight loss: Secondary | ICD-10-CM | POA: Diagnosis not present

## 2019-01-15 MED FILL — DIPHENOXYLATE-ATROPINE 2.5-: 2.5-0.025 | 7 days supply | Qty: 30 | Fill #0

## 2019-01-15 NOTE — Patient Instructions (Addendum)
-   Continue to have several small meals/snacks throughout the day.   - Try not to drink with meals.   - Have portions of Ensure between meals. Complete one/day.   - Pick up prescription for Lomotil.

## 2019-01-15 NOTE — Progress Notes (Signed)
Medical Nutrition Therapy:  Appt start time: 9:00 end time: 9:55  Patient was seen on 01/15/2019 for nutrition counseling pertaining to disordered eating  Primary care provider: Rueben BashBreejante Williams, [name] Therapist: Mathis DadBrett Debney   ROI: 10/14/2018 Any other medical team members: none   Assessment:  Primary concerns today: Pt arrives stating she is still trying to gain weight. Recent labs (05/2018) show low Fe (33) and low hemoglobin (11.7).   Pt expectations: something she can do to help gain more weight  Pt states they had a great birthday celebration for her daughter. States she requested to have Lomotil refilled and just found out its ready at the pharmacy. Plan to pick up today.    Reports she ran out of Ensure and was drinking Muscle Milk protein shake and adding protein powder to it. States if something is less than 20 g protein she adds protein powder to it. Began doing this when she was working out more.   Pt states parents complain about how come she never completes an entire Ensure at one time. States they hate that she doesn't complete the bottle and leaves it and leftover food in the fridge. States she drinks only a few ounces at a time. States comments from family members irritate her and cause her to lose appetite at times. States daughter will make comments as well. Comments from adults comparing her to daughter. Comments about being surprised she's eating again. Comments about how she needs to eat to gain weight or because it looks like she has lost weight. Makes her feel like she needs to change some things to fix it.   Previous appts: Pt states she lives with parents and 30 year old daughter. Pt states she was taking MVI and supplements prior to last GI procedure and still vitamin deficient. Was deficient prior to procedures due to absorption and diarrhea. Due to large part of intestines being removed. Pt reports being deficient in B vitamins, fat-soluble vitamins, zinc, and  selenium.   Pt speaks about daughter's eating habits and how she eats things that are sticky like macaroni and cheese. Stickiness worries her and thinks it sticks to you. Reports how eating vegetables with salad dressing defeats the purpose of eating them because its "adding up".    Pt reports having feeding tube from infancy to [age] years old. Pt reports restricting food since 28 years old; no spicy, no dairy, no greasy foods because it would cause her to vomit.    Today's weight: 97.0 (3 lbs consistent with previous visit on 12/02/2018)  Medical Information:  Changes in hair, skin, nails since ED started: drier skin, some hair loss (believes it is due to medication changes) Chewing/swallowing difficulties: no Relux or heartburn: sometimes: yes, takes reflux medications  Trouble with teeth: no LMP without the use of hormones: 2/20  Weight at that point: 92-97 lbs Constipation, diarrhea: no Dizziness/lightheadedness: often Headaches/body aches: sometimes Heart racing/chest pain: sometimes Mood: stable Sleep: tired some days Focus/concentration: reports some memory challenges Cold intolerance: yes Vision changes: no  Mental health diagnosis:   Dietary assessment: A typical day consists of 2-3 meals and 1 snack  Safe foods include: herbal tea, sweet tea, fruit smoothies, fruit, yogurt, rice, shrimp Avoided foods include: red meat, fish, bread, and cashews  24 hour recall:  B (6:15 AM): grits + 2 eggs or cereal (Fruit Loops) or smoothie (strawberries, peaches, 2 scoops of protein powder, strawberry/watermelon juice, ice)  Snk ( AM): none L: 4 grilled shrimp + steamed  vegetable rice Snk ( PM): none D ( PM): cup of lobster bisque soup + 4 oz lobster tail + sweet tea  S: Muscle Milk + scoop of protein powder or Ensure  Beverages: water (64 oz), fruit smoothies  Usual physical activity: none stated  What Methods Do You Use To Control Your Weight (Compensatory behaviors)?            Restricting (calories, fat, carbs)  Food rules or rituals-eats to gain weight, weighs about 6  times/day to see how weight fluctuates   Estimated energy intake: ~1000-1100 kcals  Estimated energy needs: 2000-2200 calories 225-248g carbohydrates 150-165 g protein 56-61 g fat  Previous visit: Pt states she has had had over half intestines removed plus more to help with bladder. Pt states bladder pain has improved but she still has some stomach issues. Pt states when she eats it seems like things are in and out.  Pt reports being able to tolerate well: soup, rice, baked chicken, seafood, not a big fan of meat, fruit. Pt states she also does not tolerate dairy or watermelon well.   Nutritional Diagnosis:  NB-1.5 Disordered eating pattern As related to skipping meals.  As evidenced by dietary recall.    Intervention:  Nutrition education and counseling. Discussed having conversation with parents about recommendations for her body-needing multiple meals/snacks a day and smaller portions. Discussed listening to her body. Can only handle smaller portions at a time. Discussed not drinking with meals and role of Lomotil medication. Encouraged to continue eating at least 3 meals a day. Pt was in agreement with goals listed.  Goals: - Continue to have several small meals/snacks throughout the day.  - Try not to drink with meals.  - Have portions of Ensure between meals. Complete one/day.  - Pick up prescription for Lomotil.   Teaching Method Utilized:  Visual Auditory Hands on  Handouts given during visit include:  none  Barriers to learning/adherence to lifestyle change: none identified  Demonstrated degree of understanding via:  Teach Back   Monitoring/Evaluation:  Dietary intake, exercise, and body weight in 2 week(s).

## 2019-01-29 ENCOUNTER — Encounter: Payer: Self-pay | Admitting: Registered"

## 2019-01-29 ENCOUNTER — Encounter: Payer: Medicaid Other | Admitting: Registered"

## 2019-01-29 ENCOUNTER — Other Ambulatory Visit: Payer: Self-pay

## 2019-01-29 DIAGNOSIS — F509 Eating disorder, unspecified: Secondary | ICD-10-CM

## 2019-01-29 DIAGNOSIS — R634 Abnormal weight loss: Secondary | ICD-10-CM | POA: Diagnosis not present

## 2019-01-29 NOTE — Patient Instructions (Signed)
-   Try to have Ensure once a day.   - Keep up the great work eating meals/snacks throughout the day and taking medication as prescribed.

## 2019-01-29 NOTE — Progress Notes (Signed)
Medical Nutrition Therapy:  Appt start time: 8:03 end time: 8:58  Patient was seen on 01/29/2019 for nutrition counseling pertaining to disordered eating  Primary care provider: Clyde Lundborg, PA Therapist: Esperanza Heir   ROI: 10/14/2018 Any other medical team members: none   Assessment:  Primary concerns today: Pt arrives stating she is still trying to gain weight. Recent labs (05/2018) show low Fe (33) and low hemoglobin (11.7).   Pt expectations: something she can do to help gain more weight  Pt states she has been dancing as workout with free weights for about 60 min. States she had fever on Monday, thinks she caused a flare-up with exercising too much.   Reports taking Lomotil as prescribed before she eats. Takes 1-2 pills per meal depending on size of meal, per pharmacists instructions. Reports she is now having firmer stools. States is feels different and does not like it. Describes consistency of "soft sausage-like" stool. Pt states she is used to having diarrhea like BM. Reports having BM about 1-2 hours after eating. States she feels hungry after a BM and will eat again. States she ran out of Ensure therefore didn't have one yesterday.   Says she doesn't want to have anything with fat in it because she thinks fat is bad.   Previous appts: Pt states she lives with parents and 74 year old daughter. Pt states she was taking MVI and supplements prior to last GI procedure and still vitamin deficient. Was deficient prior to procedures due to absorption and diarrhea. Due to large part of intestines being removed. Pt reports being deficient in B vitamins, fat-soluble vitamins, zinc, and selenium.   Pt speaks about daughter's eating habits and how she eats things that are sticky like macaroni and cheese. Stickiness worries her and thinks it sticks to you. Reports how eating vegetables with salad dressing defeats the purpose of eating them because its "adding up".    Pt reports having  feeding tube from infancy to 65.30 years old. Pt reports restricting food since 28 years old; no spicy, no dairy, no greasy foods because it would cause her to vomit.    Today's weight: 95.9 decreased about 1.1 from 2 wks ago 97.0  12/02/2018)  Medical Information:  Changes in hair, skin, nails since ED started: drier skin, some hair loss (believes it is due to medication changes) Chewing/swallowing difficulties: no Relux or heartburn: sometimes: yes, takes reflux medications  Trouble with teeth: no LMP without the use of hormones: 2/20  Weight at that point: 92-97 lbs Constipation, diarrhea: no Dizziness/lightheadedness: often Headaches/body aches: sometimes Heart racing/chest pain: sometimes Mood: stable Sleep: tired some days Focus/concentration: reports some memory challenges Cold intolerance: yes Vision changes: no  Mental health diagnosis:   Dietary assessment: A typical day consists of 2-3 meals and 1 snack  Safe foods include: herbal tea, sweet tea, fruit smoothies, fruit, yogurt, rice, shrimp Avoided foods include: red meat, fish, bread, and cashews  24 hour recall:  B (6:15 AM): 4 eggs + 2 plums Snk ( AM): none L: 6 fried green tomatoes or 4 grilled shrimp + steamed vegetable rice Snk ( PM): none D ( PM): 2 bowls of lima beans + chicken sausage  S: 4 plums   Beverages: water (64 oz), oolong tea, Sprite (8 oz)  Usual physical activity: none stated  What Methods Do You Use To Control Your Weight (Compensatory behaviors)?           Restricting (calories, fat, carbs)  Food rules  or rituals-eats to gain weight, weighs about 6  times/day to see how weight fluctuates   Estimated energy intake: ~1300-1400 kcals  Estimated energy needs: 2000-2200 calories 225-248g carbohydrates 150-165 g protein 56-61 g fat  Previous visit: Pt states she has had had over half intestines removed plus more to help with bladder. Pt states bladder pain has improved but she still has  some stomach issues. Pt states when she eats it seems like things are in and out.  Pt reports being able to tolerate well: soup, rice, baked chicken, seafood, not a big fan of meat, fruit. Pt states she also does not tolerate dairy or watermelon well.   Nutritional Diagnosis:  NB-1.5 Disordered eating pattern As related to skipping meals.  As evidenced by dietary recall.    Intervention:  Nutrition education and counseling. Discussed role of Lomotil and changes in stool consistency. Discussed having conversations with therapist about feelings related to stool changes. Encouraged pt to add Ensure to her day to continue to increase nutritional intake. Pt is doing well making changes. Encouraged to continue eating at least 3 meals a day. Pt was in agreement with goals listed.  Goals: - Try to have Ensure once a day.  - Keep up the great work eating meals/snacks throughout the day and taking medication as prescribed.   Teaching Method Utilized:  Visual Auditory Hands on  Handouts given during visit include:  none  Barriers to learning/adherence to lifestyle change: none identified  Demonstrated degree of understanding via:  Teach Back   Monitoring/Evaluation:  Dietary intake, exercise, and body weight in 2 week(s).

## 2019-02-04 MED FILL — SYNTHROID 150 MCG TABLET: 150 | 30 days supply | Qty: 30 | Fill #0

## 2019-02-12 ENCOUNTER — Encounter: Payer: Medicaid Other | Attending: Family Medicine | Admitting: Registered"

## 2019-02-12 ENCOUNTER — Encounter: Payer: Self-pay | Admitting: Registered"

## 2019-02-12 ENCOUNTER — Other Ambulatory Visit: Payer: Self-pay

## 2019-02-12 DIAGNOSIS — R634 Abnormal weight loss: Secondary | ICD-10-CM | POA: Diagnosis present

## 2019-02-12 DIAGNOSIS — F509 Eating disorder, unspecified: Secondary | ICD-10-CM

## 2019-02-12 NOTE — Progress Notes (Signed)
Medical Nutrition Therapy:  Appt start time: 8:10 end time: 9:05  Patient was seen on 02/12/2019 for nutrition counseling pertaining to disordered eating  Primary care provider: Rueben BashBreejante Williams, PA Therapist: Mathis DadBrett Debney   ROI: 10/14/2018 Any other medical team members: none   Assessment:  Primary concerns today: Pt arrives stating she is still trying to gain weight. Recent labs (05/2018) show low Fe (33) and low hemoglobin (11.7).   Pt expectations: something she can do to help gain more weight  Today: States BM have become looser and more frequent as soon as she finishes eating. States she thinks being in the heat and moving around more speeds up BM for her. \  Pt is distracted on the phone during the appt trying to get daughter and grandma set-up with online school. Pt states mom likes to fuss and can be stressful. Spends the remainder of appt discussing mom, boundaries, and concerns with raising daughter.   Previous appts: Pt states she lives with parents and 30 year old daughter. Pt states she was taking MVI and supplements prior to last GI procedure and still vitamin deficient. Was deficient prior to procedures due to absorption and diarrhea. Due to large part of intestines being removed. Pt reports being deficient in B vitamins, fat-soluble vitamins, zinc, and selenium.   Pt speaks about daughter's eating habits and how she eats things that are sticky like macaroni and cheese. Stickiness worries her and thinks it sticks to you. Reports how eating vegetables with salad dressing defeats the purpose of eating them because its "adding up".   Reports taking Lomotil as prescribed before she eats. Takes 1-2 pills per meal depending on size of meal, per pharmacists instructions. Says she doesn't want to have anything with fat in it because she thinks fat is bad  Pt reports having feeding tube from infancy to 104.28 years old. Pt reports restricting food since 28 years old; no spicy, no dairy,  no greasy foods because it would cause her to vomit.    Today's weight: 95.1 maintaned from 2 wks ago 95.9  (01/29/2019)  Medical Information:  Changes in hair, skin, nails since ED started: drier skin, some hair loss (believes it is due to medication changes) Chewing/swallowing difficulties: no Relux or heartburn: sometimes: yes, takes reflux medications  Trouble with teeth: no LMP without the use of hormones: 2/20  Weight at that point: 92-97 lbs Constipation, diarrhea: no Dizziness/lightheadedness: often Headaches/body aches: sometimes Heart racing/chest pain: sometimes Mood: stable Sleep: tired some days Focus/concentration: reports some memory challenges Cold intolerance: yes Vision changes: no  Mental health diagnosis:   Dietary assessment: A typical day consists of 2-3 meals and 1 snack  Safe foods include: herbal tea, sweet tea, fruit smoothies, fruit, yogurt, rice, shrimp Avoided foods include: red meat, fish, bread, and cashews  24 hour recall:  B (6:15 AM): kiwi + 4 eggs + a lot of lemon water Snk ( AM): none L (12 PM): 5 fried green tomatoes or 4 grilled shrimp + steamed vegetable rice Snk ( PM): none D (5 PM): a bowl 8 shrimp + rice + carrots + celery   S: 4 plums   Beverages: water (64 oz), oolong tea, Sprite (8 oz)  Usual physical activity: none stated  What Methods Do You Use To Control Your Weight (Compensatory behaviors)?           Restricting (calories, fat, carbs)  Food rules or rituals-eats to gain weight, weighs about 6  times/day to see how  weight fluctuates   Estimated energy intake: ~1000 kcals  Estimated energy needs: 2000-2200 calories 225-248g carbohydrates 150-165 g protein 56-61 g fat  Previous visit: Pt states she has had had over half intestines removed plus more to help with bladder. Pt states bladder pain has improved but she still has some stomach issues. Pt states when she eats it seems like things are in and out.  Pt reports  being able to tolerate well: soup, rice, baked chicken, seafood, not a big fan of meat, fruit. Pt states she also does not tolerate dairy or watermelon well.   Nutritional Diagnosis:  NB-1.5 Disordered eating pattern As related to skipping meals.  As evidenced by dietary recall.    Intervention:  Nutrition education and counseling. Discussed talking with therapist about family dynamic concerns happening at home and coping with stress. Encouraged to continue eating at least 3 meals a day. Pt was in agreement with goals listed.   Teaching Method Utilized:  Visual Auditory Hands on  Handouts given during visit include:  none  Barriers to learning/adherence to lifestyle change: none identified  Demonstrated degree of understanding via:  Teach Back   Monitoring/Evaluation:  Dietary intake, exercise, and body weight in 3 week(s).

## 2019-03-10 ENCOUNTER — Other Ambulatory Visit: Payer: Self-pay

## 2019-03-10 ENCOUNTER — Encounter: Payer: Medicaid Other | Attending: Family Medicine | Admitting: Registered"

## 2019-03-10 DIAGNOSIS — R634 Abnormal weight loss: Secondary | ICD-10-CM | POA: Insufficient documentation

## 2019-03-10 DIAGNOSIS — F509 Eating disorder, unspecified: Secondary | ICD-10-CM

## 2019-03-10 NOTE — Progress Notes (Signed)
Medical Nutrition Therapy:  Appt start time: 3:30 end time: 4:30  Patient was seen on 03/10/2019 for nutrition counseling pertaining to disordered eating  Primary care provider: Clyde Lundborg, PA Therapist: Esperanza Heir   ROI: 10/14/2018 Any other medical team members: none   Assessment:  Primary concerns today: Pt arrives stating she is still trying to gain weight. Recent labs (05/2018) show low Fe (33) and low hemoglobin (11.7).   Pt expectations: something she can do to help gain more weight  States Lomotil is not working. States she is trying to maintain weight, have consistent BM, and vitamin stability. Pt states she feels like this is becoming too much. Reports she had stopped drinking Ensure because it gave her diarrhea, and thinks its because of sugar content. Pt states she uses fruit and fruit juice  in her smoothies which does not give her diarrhea.  States she only uses 3 strawberries, 2 peach slices, and 1/2 c juice.   Previous appts: Pt states she lives with parents and 75 year old daughter. Pt states she was taking MVI and supplements prior to last GI procedure and still vitamin deficient. Was deficient prior to procedures due to absorption and diarrhea. Due to large part of intestines being removed. Pt reports being deficient in B vitamins, fat-soluble vitamins, zinc, and selenium.   Reports taking Lomotil as prescribed before she eats. Takes 1-2 pills per meal depending on size of meal, per pharmacists instructions. Says she doesn't want to have anything with fat in it because she thinks fat is bad  Pt reports having feeding tube from infancy to [age] years old. Pt reports restricting food since 28 years old; no spicy, no dairy, no greasy foods because it would cause her to vomit.   Pt states she has had over half intestines removed plus more to help with bladder. Pt states bladder pain has improved but she still has some stomach issues.   Pt reports being able to tolerate  well: soup, rice, baked chicken, seafood, not a big fan of meat, fruit. Pt states she also does not tolerate dairy or watermelon well.    Today's weight: 94.1 loss of 1 lb from 3 wks ago 95.1  (02/12/2019)  Medical Information:  Changes in hair, skin, nails since ED started: drier skin, some hair loss (believes it is due to medication changes) Chewing/swallowing difficulties: no Relux or heartburn: sometimes: yes, takes reflux medications  Trouble with teeth: no LMP without the use of hormones: 09/04  Weight at that point: 94 lbs Constipation, diarrhea: no Dizziness/lightheadedness: yes Headaches/body aches: sometimes Heart racing/chest pain: sometimes Mood: stable Sleep: tired some days Focus/concentration: reports some memory challenges Cold intolerance: no Vision changes: no  Mental health diagnosis:   Dietary assessment: A typical day consists of 2-3 meals and 1 snack  Safe foods include: herbal tea, sweet tea, fruit smoothies, fruit, yogurt, rice, shrimp Avoided foods include: red meat, fish, bread, and cashews  24 hour recall:  B (6:15 AM): fried egg + grits  Snk ( AM): none L (12 PM): steamed okra + Cookout-1/2 chili cheese fries Snk ( PM):  D (5 PM): 1/2 soup (shrimp + corn + potatoes + cream of chicken) + baked chicken leg  S:    Beverages: water (64 oz)  Usual physical activity: none stated  What Methods Do You Use To Control Your Weight (Compensatory behaviors)?           Restricting (calories, fat, carbs)  Food rules or rituals-eats to  gain weight, weighs about 6  times/day to see how weight fluctuates   Estimated energy intake: <1000 kcals  Estimated energy needs: 2000-2200 calories 225-248g carbohydrates 150-165 g protein 56-61 g fat  Nutritional Diagnosis:  NB-1.5 Disordered eating pattern As related to skipping meals.  As evidenced by dietary recall.    Intervention:  Nutrition education and counseling. Discussed inconsistency of of nutritional  shakes and ways to re-establish consistency tp help with adequately nourishing the body. Encouraged to continue eating at least 3 meals a day and 2-3 shakes. Also encouraged pt with having a variety food groups during the day. Pt was given Boost nutritional supplement. Pt was in agreement with goals listed.  Goals: - Try Boost or Equate nutritional shake. Have 2-3 times a day, along with 3 meals.   Teaching Method Utilized:  Visual Auditory Hands on  Handouts given during visit include:  none  Barriers to learning/adherence to lifestyle change: none identified  Demonstrated degree of understanding via:  Teach Back   Monitoring/Evaluation:  Dietary intake, exercise, and body weight in 4 week(s).

## 2019-03-10 NOTE — Patient Instructions (Addendum)
-   Try Boost or Equate nutritional shake. Have 2-3 times a day, along with 3 meals.

## 2019-04-06 ENCOUNTER — Encounter: Payer: Self-pay | Admitting: *Deleted

## 2019-04-07 ENCOUNTER — Other Ambulatory Visit: Payer: Self-pay

## 2019-04-07 ENCOUNTER — Encounter: Payer: Self-pay | Admitting: Neurology

## 2019-04-07 ENCOUNTER — Telehealth: Payer: Self-pay | Admitting: Neurology

## 2019-04-07 ENCOUNTER — Ambulatory Visit: Payer: Medicaid Other | Admitting: Neurology

## 2019-04-07 ENCOUNTER — Telehealth: Payer: Self-pay | Admitting: *Deleted

## 2019-04-07 VITALS — BP 118/75 | HR 92 | Temp 98.4°F | Wt 97.5 lb

## 2019-04-07 DIAGNOSIS — R202 Paresthesia of skin: Secondary | ICD-10-CM | POA: Diagnosis not present

## 2019-04-07 DIAGNOSIS — G43709 Chronic migraine without aura, not intractable, without status migrainosus: Secondary | ICD-10-CM | POA: Diagnosis not present

## 2019-04-07 DIAGNOSIS — IMO0002 Reserved for concepts with insufficient information to code with codable children: Secondary | ICD-10-CM

## 2019-04-07 MED ORDER — AJOVY 225 MG/1.5ML ~~LOC~~ SOSY: 225.0000 mg | PREFILLED_SYRINGE | SUBCUTANEOUS | 11 refills | Status: DC

## 2019-04-07 MED ORDER — ELETRIPTAN HYDROBROMIDE 40 MG PO TABS: 40.0000 mg | ORAL_TABLET | ORAL | 11 refills | Status: DC | PRN

## 2019-04-07 MED ORDER — PROPRANOLOL HCL ER 80 MG PO CP24: 80.0000 mg | ORAL_CAPSULE | Freq: Every day | ORAL | 11 refills | Status: DC

## 2019-04-07 MED ORDER — UBRELVY 50 MG PO TABS: 50.0000 mg | ORAL_TABLET | ORAL | 11 refills | Status: DC | PRN

## 2019-04-07 NOTE — Addendum Note (Signed)
Addended by: Marcial Pacas on: 04/07/2019 04:14 PM   Modules accepted: Orders

## 2019-04-07 NOTE — Telephone Encounter (Signed)
Medicaid order sent to GI. They will obtain the auth and reach out to the patient to schedule.  

## 2019-04-07 NOTE — Telephone Encounter (Signed)
error 

## 2019-04-07 NOTE — Telephone Encounter (Addendum)
The patient has [place] Medicaid (pt ZJ#[id number] L)  She has only tried Topamax in the past for preventive medication.  They require failure to another oral medication prior to considering coverage for Ajovy.  Dr. Krista Blue has sent in a new prescription for propranolol ER 79m, one capsule at bedtime to try first.    Additionally, they will not cover Ubrelvy at this time due to lack of other tried medications.    She has tried sumatriptan and rizatriptan in the past.  Relpax was sent into the pharmacy.  [facility] [facility]  I called [facility] to make sure all was correct.  The only active prescription they have for her migraines now is the propranolol and Relpax.  The other prescriptions have been voided off her profile.  I called and left the patient a detailed message (ok per DPR) with this updated information.  She will also be informed by the pharmacy.

## 2019-04-07 NOTE — Progress Notes (Signed)
PATIENT: [name] DOB: 1990-12-20  Chief Complaint  Patient presents with  . Migraine    She estimates three headache days per week.  She does not use medication for the pain.  She has previously used Topamax for prevention and it worked well.  She stopped it because she changed over to Botox.  Her migraines lessened over time and she stopped all treatments in 2015.  She has noticed an increase over the last three months.  . Numbness    She is also concerned about intermittent numbness/tingling her hands and feet.   Marland Kitchen PCP    [name], PA-C     HISTORICAL  [name] is a 28 year old female, seen in request by her primary care PA Rueben Bash for evaluation of chronic migraine, numbness, initial evaluation was on April 07, 2019.-  I have reviewed and summarized the referring note from the referring physician.  She had complicated past medical history, connective tissue disease, is taking Plaquenil 400 mg every morning, interstitial cystitis, hypothyroidism, on supplement, vitiligo  She reported a history of migraine headaches since age 51, her typical migraine started from occipital region, spreading forward, to become retro-orbital area severe pounding headache with associated light, noise sensitivity, nauseous, lasting few hours to 1 day, she now has migraine about 3 times each week, previously was treated with Topamax as preventive medication, only benefit her for a while and then stop, she was also treated with Botox in 2015, which was also helpful.  Previously she has tried Imitrex, Maxalt without significant improvement of her headache  She had long history of interstitial cystitis, eventually had ostomy,  Since 2013, following her C-section delivery of her child, began to notice numbness tingling at bilateral lower extremity, gradually getting worse, and also with bilateral hands involvement, she denies headache, has frequent diarrhea, she  does complains of chronic neck pain.  REVIEW OF SYSTEMS: Full 14 system review of systems performed and notable only for as above All other review of systems were negative.  ALLERGIES: Allergies  Allergen Reactions  . Penicillins Anaphylaxis  . Buprenorphine Hcl Itching  . Morphine And Related Itching  . Hydrocodone-Acetaminophen Rash    HOME MEDICATIONS: Current Outpatient Medications  Medication Sig Dispense Refill  . AMBULATORY NON FORMULARY MEDICATION Iron Infusions every 3 months done at [facility]- ordered thru [facility]    . cetirizine (ZYRTEC) 5 MG tablet Take 5 mg by mouth daily.    . cyclobenzaprine (FLEXERIL) 10 MG tablet Take 1 tablet (10 mg total) by mouth 2 (two) times daily as needed for muscle spasms. 14 tablet 0  . diazepam (DIASTAT ACUDIAL) 10 MG GEL Place 10 mg rectally at bedtime.    . dicyclomine (BENTYL) 10 MG capsule Take 1 capsule (10 mg total) by mouth 2 (two) times daily. 60 capsule 1  . diphenoxylate-atropine (LOMOTIL) 2.5-0.025 MG tablet One tablet twice a day 60 tablet 0  . hydroxychloroquine (PLAQUENIL) 200 MG tablet Take 400 mg by mouth every morning.     Marland Kitchen levothyroxine (SYNTHROID) 100 MCG tablet Take 175 mcg by mouth daily.     Marland Kitchen omalizumab Geoffry Paradise) 150 MG/ML prefilled syringe Inject 150 mg into the skin every 28 (twenty-eight) days.     . OMEPRAZOLE PO Take 20 mg by mouth at bedtime.     Marland Kitchen QUEtiapine (SEROQUEL) 300 MG tablet Take 300 mg by mouth at bedtime.    Marland Kitchen tiZANidine (ZANAFLEX) 4 MG tablet Take 4 mg by  mouth every 8 (eight) hours as needed for muscle spasms.      No current facility-administered medications for this visit.     PAST MEDICAL HISTORY: Past Medical History:  Diagnosis Date  . Anemia   . Anxiety   . Bilateral hearing loss   . Bladder mass   . Chronic interstitial cystitis   . Chronic interstitial cystitis with hematuria    2016  . Chronic sinusitis   . Fibromyalgia   . GERD (gastroesophageal reflux disease)    . Gross hematuria   . History of Graves' disease    tx done in 2007  . History of migraine   . History of recurrent UTIs   . Hypothyroidism   . Iron deficiency anemia    due to SGS  . Lower urinary tract symptoms (LUTS)   . Lupus [facility](HCC)    rheumologist--  dr Janalyn Rouseshaili devashwer  . Malabsorption syndrome   . Migraine   . Migraine   . Passage of loose stools    chronic --  due to short gut syndrome  . Pleuritis   . S/P radioactive iodine thyroid ablation    2007  . SGS (short gut syndrome)   . Short gut syndrome   . Sjogren's disease (HCC)   . Vitiligo   . Wears glasses     PAST SURGICAL HISTORY: Past Surgical History:  Procedure Laterality Date  . BOWEL RESECTION  newborn   necrotizing enterocolitis (liver patched)  . BRONCHOSCOPY  2014  . CESAREAN SECTION  01/02/2012   Procedure: CESAREAN SECTION;  Surgeon: Lenoard Adenichard J Taavon, MD;  Location: [facility];  Service: Gynecology;  Laterality: N/A;  . COLONOSCOPY  07-29-2013  . CYSTECTOMY W/ URETEROILEAL CONDUIT  10/2017  . CYSTOSCOPY WITH BIOPSY N/A 11/23/2014   Procedure: CYSTOSCOPY WITH BLADDER  BIOPSY AND FULGERATION;  Surgeon: Bjorn PippinJohn Wrenn, MD;  Location[facility];  Service: Urology;  Laterality: N/A;  . KNEE ARTHROSCOPY Right 2008  . LIVER SURGERY    . MULTIPLE EXTRACTIONS WITH ALVEOLOPLASTY N/A 03/24/2018   Procedure: EXTRACTIONS X 9;  Surgeon: Ocie DoyneJensen, Scott, DDS;  Location: [facility];  Service: Oral Surgery;  Laterality: N/A;  . SMALL INTESTINE SURGERY      FAMILY HISTORY: Family History  Problem Relation Age of Onset  . Diabetes Father   . Endometriosis Mother   . Fibromyalgia Mother   . Heart disease Maternal Grandmother   . Hypertension Maternal Grandmother   . Osteoporosis Maternal Grandmother   . Heart disease Maternal Grandfather   . Hypertension Maternal Grandfather   . Prostate cancer Maternal Grandfather   . Heart disease Paternal Grandmother   . Other Neg Hx   . Colon cancer  Neg Hx   . Pancreatic cancer Neg Hx   . Stomach cancer Neg Hx     SOCIAL HISTORY: Social History   Socioeconomic History  . Marital status: Divorced    Spouse name: Not on file  . Number of children: 1  . Years of education: college  . Highest education level: Not on file  Occupational History  . Occupation: N/A  Social Needs  . Financial resource strain: Not on file  . Food insecurity    Worry: Not on file    Inability: Not on file  . Transportation needs    Medical: Not on file    Non-medical: Not on file  Tobacco Use  . Smoking status: Never Smoker  . Smokeless tobacco: Never Used  Substance and  Sexual Activity  . Alcohol use: No    Alcohol/week: 0.0 standard drinks  . Drug use: No  . Sexual activity: Not on file  Lifestyle  . Physical activity    Days per week: Not on file    Minutes per session: Not on file  . Stress: Not on file  Relationships  . Social Musician on phone: Not on file    Gets together: Not on file    Attends religious service: Not on file    Active member of club or organization: Not on file    Attends meetings of clubs or organizations: Not on file    Relationship status: Not on file  . Intimate partner violence    Fear of current or ex partner: Not on file    Emotionally abused: Not on file    Physically abused: Not on file    Forced sexual activity: Not on file  Other Topics Concern  . Not on file  Social History Narrative   Lives at home with family.   Right-handed.   6 cups tea throughout the week.     PHYSICAL EXAM   Vitals:   04/07/19 0943  BP: 118/75  Pulse: 92  Temp: 98.4 F (36.9 C)  Weight: 97 lb 8 oz (44.2 kg)    Not recorded      Body mass index is 19.04 kg/m.  PHYSICAL EXAMNIATION:  Gen: NAD, conversant, well nourised, well groomed                     Cardiovascular: Regular rate rhythm, no peripheral edema, warm, nontender. Eyes: Conjunctivae clear without exudates or hemorrhage Neck:  Supple, no carotid bruits. Pulmonary: Clear to auscultation bilaterally   NEUROLOGICAL EXAM:  MENTAL STATUS: Speech:    Speech is normal; fluent and spontaneous with normal comprehension.  Cognition:     Orientation to time, place and person     Normal recent and remote memory     Normal Attention span and concentration     Normal Language, naming, repeating,spontaneous speech     Fund of knowledge   CRANIAL NERVES: CN II: Visual fields are full to confrontation.  Pupils are round equal and briskly reactive to light. CN III, IV, VI: extraocular movement are normal. No ptosis. CN V: Facial sensation is intact to pinprick in all 3 divisions bilaterally. Corneal responses are intact.  CN VII: Face is symmetric with normal eye closure and smile. CN VIII: Hearing is normal to causal conversation. CN IX, X: Palate elevates symmetrically. Phonation is normal. CN XI: Head turning and shoulder shrug are intact CN XII: Tongue is midline with normal movements and no atrophy.  MOTOR: There is no pronator drift of out-stretched arms. Muscle bulk and tone are normal. Muscle strength is normal.  REFLEXES: Reflexes are 2+ and symmetric at the biceps, triceps, knees, and ankles. Plantar responses are flexor.  SENSORY: Intact to light touch, pinprick, positional sensation and vibratory sensation are intact in fingers and toes.  COORDINATION: Rapid alternating movements and fine finger movements are intact. There is no dysmetria on finger-to-nose and heel-knee-shin.    GAIT/STANCE: Posture is normal. Gait is steady with normal steps, base, arm swing, and turning. Heel and toe walking are normal. Tandem gait is normal.  Romberg is absent.   DIAGNOSTIC DATA (LABS, IMAGING, TESTING) - I reviewed patient records, labs, notes, testing and imaging myself where available.   ASSESSMENT AND PLAN  [name]  is a 28 y.o. female   Chronic migraine headaches Polypharmacy treatment   Previously she tried Topamax with limited help, now on polypharmacy for her mood disorder including Seroquel 300 mg at bedtime,  Will use ajovy 225 mg once every month as headache prevention  She has tried Imitrex, Maxalt in the past, without significant improvement of her headaches, will try Relpax 40 mg as needed  Bilateral upper and lower extremity paresthesia  MRI of brain, cervical spine to rule out structure lesions  EMG/NCS.  Marcial Pacas, M.D. Ph.D.  [facility] [address] [place], [place] [address] Ph: [phone] Fax: [phone]  CC: [place] Bene, [place]

## 2019-04-08 ENCOUNTER — Encounter: Payer: Medicaid Other | Attending: Family Medicine | Admitting: Registered"

## 2019-04-08 ENCOUNTER — Encounter: Payer: Self-pay | Admitting: Registered"

## 2019-04-08 ENCOUNTER — Other Ambulatory Visit: Payer: Self-pay

## 2019-04-08 DIAGNOSIS — R634 Abnormal weight loss: Secondary | ICD-10-CM | POA: Diagnosis not present

## 2019-04-08 NOTE — Patient Instructions (Signed)
-   Continue to resist weighing on scale.   - Continue having at least 1 Ensure daily.

## 2019-04-08 NOTE — Progress Notes (Signed)
Medical Nutrition Therapy:  Appt start time: 9:35 end time: 10:20  Patient was seen on 04/08/2019 for nutrition counseling pertaining to disordered eating  Primary care provider: Rueben Bash, PA Therapist: Mathis Dad   ROI: 10/14/2018 Any other medical team members: none   Assessment:  Primary concerns today: Pt arrives stating she is still trying to gain weight. Recent labs (05/2018) show low Fe (33) and low hemoglobin (11.7).   Pt expectations: something she can do to help gain more weight  Pt arrives stating she has been drinking 1-2 Ensures a day. Has been eating more consistently because more people in the home, a friend has moved in. States she cooks 3 meals/day and having snacks between meals. Reports checking her weight for the 1st time in a long time and then checked again this week (was disappointed). Reports stomach hurt yesterday.   Previous appts: Pt states she lives with parents and 81 year old daughter. Pt states she was taking MVI and supplements prior to last GI procedure and still vitamin deficient. Was deficient prior to procedures due to absorption and diarrhea. Due to large part of intestines being removed. Pt reports being deficient in B vitamins, fat-soluble vitamins, zinc, and selenium.   Reports taking Lomotil as prescribed before she eats. Takes 1-2 pills per meal depending on size of meal, per pharmacists instructions. Says she doesn't want to have anything with fat in it because she thinks fat is bad. States Lomotil is not working. Reports she had stopped drinking Ensure because it gave her diarrhea, and thinks its because of sugar content. Pt states she uses fruit and fruit juice  in her smoothies which does not give her diarrhea.  Pt reports having feeding tube from infancy to [age] years old. Pt reports restricting food since 28 years old; no spicy, no dairy, no greasy foods because it would cause her to vomit.   Pt states she has had over half intestines  removed plus more to help with bladder. Pt states bladder pain has improved but she still has some stomach issues.   Pt reports being able to tolerate well: soup, rice, baked chicken, seafood, not a big fan of meat, fruit. Pt states she also does not tolerate dairy or watermelon well.    Today's weight: 97.2 (+3.1 lbs) from 4 wks ago 94.1  (03/10/2019)  Medical Information:  Changes in hair, skin, nails since ED started: drier skin, some hair loss (believes it is due to medication changes) Chewing/swallowing difficulties: no Relux or heartburn: sometimes: yes, takes reflux medications  Trouble with teeth: no LMP without the use of hormones: 10/01  Weight at that point: 97 lbs Constipation, diarrhea: no, yes (pt states this is normal) Dizziness/lightheadedness: yes, has improved Headaches/body aches: sometimes Heart racing/chest pain: sometimes Mood: stable Sleep: tired some days Focus/concentration: reports some memory challenges Cold intolerance: no Vision changes: no  Mental health diagnosis:   Dietary assessment: A typical day consists of 2-3 meals and 1 snack  Safe foods include: herbal tea, sweet tea, fruit smoothies, fruit, yogurt, rice, shrimp Avoided foods include: red meat, fish, bread, and cashews  24 hour recall:  B (6:15 AM):chicken, egg, & cheese biscuit  Snk ( AM): 1/2 Ensure L (12 PM): smoothie (strawberries, peaches, Activia yogurt, ice) + BLT + 1/2 pkg goldfish Snk ( PM): 1/3 rice + butter + 1/3 mixed greens + banana D (5 PM): jasmine rice + peppers/onions + 6 shrimp + 3 broccoli crowns S: 1/2 Ensure  Beverages: water (  64 oz), ginger ale  Usual physical activity: dance exercises 60 min, biking, walking/running 3 mi, 2x/day  What Methods Do You Use To Control Your Weight (Compensatory behaviors)?           Restricting (calories, fat, carbs)  Food rules or rituals-eats to gain weight, weighs about 6  times/day to see how weight fluctuates   Estimated  energy intake: 1300-1400 kcals  Estimated energy needs: 2000-2200 calories 225-248g carbohydrates 150-165 g protein 56-61 g fat  Nutritional Diagnosis:  NB-1.5 Disordered eating pattern As related to skipping meals.  As evidenced by dietary recall.    Intervention:  Nutrition education and counseling. Encouraged pt with increased intake and eating meals/snacks throughout day. Encouraged with Ensure at least once a day. Discussed importance of consistency with daily meals/snacks and meeting nutritional needs. Also discussed reasons why checking weight is not important. Pt has made positive changes since previous appt. Pt was in agreement with goals listed.  Goals: - Continue to resist weighing on scale.  - Continue having at least 1 Ensure daily.   Teaching Method Utilized:  Visual Auditory Hands on  Handouts given during visit include:  none  Barriers to learning/adherence to lifestyle change: none identified  Demonstrated degree of understanding via:  Teach Back   Monitoring/Evaluation:  Dietary intake, exercise, and body weight in 4 week(s).

## 2019-04-20 NOTE — Addendum Note (Signed)
Addended by: Marcial Pacas on: 04/20/2019 04:34 PM   Modules accepted: Orders

## 2019-04-20 NOTE — Addendum Note (Signed)
Addended by: Marcial Pacas on: 04/20/2019 04:37 PM   Modules accepted: Orders

## 2019-04-21 ENCOUNTER — Telehealth: Payer: Self-pay | Admitting: Neurology

## 2019-04-21 NOTE — Telephone Encounter (Signed)
medicaid order sent to GI. They will obtain the auth and reach out to the patient to schedule.  °

## 2019-04-27 DIAGNOSIS — Z79899 Other long term (current) drug therapy: Secondary | ICD-10-CM | POA: Insufficient documentation

## 2019-04-27 DIAGNOSIS — H0102A Squamous blepharitis right eye, upper and lower eyelids: Secondary | ICD-10-CM | POA: Insufficient documentation

## 2019-04-27 DIAGNOSIS — H0288A Meibomian gland dysfunction right eye, upper and lower eyelids: Secondary | ICD-10-CM | POA: Insufficient documentation

## 2019-04-27 DIAGNOSIS — H0288B Meibomian gland dysfunction left eye, upper and lower eyelids: Secondary | ICD-10-CM | POA: Insufficient documentation

## 2019-04-27 DIAGNOSIS — H04123 Dry eye syndrome of bilateral lacrimal glands: Secondary | ICD-10-CM | POA: Insufficient documentation

## 2019-04-27 DIAGNOSIS — M320 Drug-induced systemic lupus erythematosus: Secondary | ICD-10-CM | POA: Insufficient documentation

## 2019-04-27 DIAGNOSIS — H0102B Squamous blepharitis left eye, upper and lower eyelids: Secondary | ICD-10-CM | POA: Insufficient documentation

## 2019-04-30 ENCOUNTER — Other Ambulatory Visit: Payer: Medicaid Other

## 2019-05-04 ENCOUNTER — Other Ambulatory Visit: Payer: Self-pay

## 2019-05-04 ENCOUNTER — Encounter: Payer: Self-pay | Admitting: Gastroenterology

## 2019-05-04 ENCOUNTER — Ambulatory Visit: Payer: Medicaid Other | Admitting: Gastroenterology

## 2019-05-04 VITALS — BP 90/70 | HR 88 | Temp 98.2°F | Ht 60.5 in | Wt 96.4 lb

## 2019-05-04 DIAGNOSIS — Z1159 Encounter for screening for other viral diseases: Secondary | ICD-10-CM | POA: Diagnosis not present

## 2019-05-04 DIAGNOSIS — R1013 Epigastric pain: Secondary | ICD-10-CM | POA: Diagnosis not present

## 2019-05-04 NOTE — Patient Instructions (Signed)
If you are age 30 or older, your body mass index should be between 23-30. Your Body mass index is 18.51 kg/m. If this is out of the aforementioned range listed, please consider follow up with your Primary Care Provider.  If you are age 9 or younger, your body mass index should be between 19-25. Your Body mass index is 18.51 kg/m. If this is out of the aformentioned range listed, please consider follow up with your Primary Care Provider.    Change your medication to omeprazole every morning and to Pepid 20mg  at bedtime.  Due to recent COVID-19 restrictions implemented by [employer] and state authorities and in an effort to keep both patients and staff as safe as possible, [facility] requires COVID-19 testing prior to any scheduled endoscopic procedure. The testing center is located at [address] [place], [place] [address] in the [facility] [employer]  suite.  Your appointment has been scheduled for 05/13/2019 at 10:45.   Please bring your insurance cards to this appointment. You will require your COVID screen 2 business days prior to your endoscopic procedure.  You are not required to quarantine after your screening.  You will only receive a phone call with the results if it is POSITIVE.  If you do not receive a call the day before your procedure you should begin your prep, if ordered, and you should report to the endo center for your procedure at your designated appointment arrival time ( one hour prior to the procedure time). There is no cost to you for the screening on the day of the swab.  [facility] will file with your insurance company for the testing.    You may receive an automated phone call prior to your procedure or have a message in your MyChart that you have an appointment for a BP/15 at the [facility], please disregard this message.  Your testing will be at the [place] [place] location.   If you are leaving [facility] travel [place] on [address], turn left onto [facility] [address], turn night onto [address] at the 1st stop light turn right, pass the [employer] on your right and proceed to [place] (white building).    Thank you for choosing me and [facility] Owens Loffler, MD

## 2019-05-04 NOTE — Telephone Encounter (Signed)
Medicaid did not approve the CT  Reason being : Based on [employer] Guidelines Section(s): SP 3.1 Neck (Cervical Spine) Pain without and with Neurological Features (Including Stenosis), we cannot approve this request. Your records show that you have neck pain. The reason this request cannot be approved is because: 1. Computed Tomography (CT) might be supported in the evaluation of suspected or known spinal disease with one of the following. -Failure to improve after a recent (within three months) six week trial of provider-directed treatment with clinical re-evaluation. -Any signs or symptoms such as significant motor weakness, malignancy, infection, cauda equina syndrome, or planned surgery, for which conservative treatment is not needed. The clinical information received fails to support meeting these requirements and, therefore, the request is not indicated at this time.  There is an option to do a peer to peer. The phone number is [phone] option 4. The case number is [id number]. It has to be scheduled by 05/06/19.

## 2019-05-04 NOTE — Progress Notes (Signed)
Review of pertinent gastrointestinal problems: 1. 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  On TPN until age 89 or 3.  Testing January 2019 as work-up for cystectomy for "end-stage interstitial cystitis" showed that she was doing fairly well nutritionally.  DEXA scan, small bowel follow-through, vitamin levels all appear normal I did recommend she continue multi vitamin daily including zinc, calcium, vitamin D and over-the-counter iron supplement every single day. 2. Constipation: large amount of stool in dilated colon 03/2013;  Colonoscopy 07/2013: tortuous colon, IC anastomosis was normal.  Cholestyramine trial 2015 helped. 3. Diarrhea workup 2017: stool path panel negative.    HPI: This is a very pleasant 28 year old woman whom I last saw about 2 years ago as part of work-up for her bladder cystectomy, ileal conduit surgery by urology.  Today she is here for a different reason.  She does tell me that since her bladder was removed she no longer has any bladder pains.  She is still bothered by pains in her pelvis however.  She also thinks she might have a hernia at her ileal conduit ostomy as it bulges outward sometimes.  She is due to see her urologist in 2 or 3 weeks to discuss that.  Which is followed by now is epigastric pains.  They are constant.  They wax and wane throughout the day.  Eating does not seem to affect the pains either for the better or for the worse.  Sometimes when she bends forward the pains might subside a little bit.  Sometimes she will have nausea and rarely vomit with the pains.  She does not take NSAIDs.  She takes omeprazole at bedtime.  Her weight has been overall stable  Her bowels are unchanged  Old Data Reviewed: Labs May 2020 show normal CBC, normal ferritin.    Review of systems: Pertinent positive and negative review of systems were noted in the above HPI section. All other  review negative.   Past Medical History:  Diagnosis Date  . Anemia   . Anxiety   . Bilateral hearing loss   . Bladder mass   . Chronic interstitial cystitis   . Chronic interstitial cystitis with hematuria    2016  . Chronic sinusitis   . Fibromyalgia   . GERD (gastroesophageal reflux disease)   . Gross hematuria   . History of Graves' disease    tx done in 2007  . History of migraine   . History of recurrent UTIs   . Hypothyroidism   . Iron deficiency anemia    due to SGS  . Lower urinary tract symptoms (LUTS)   . Lupus [facility](HCC)    rheumologist--  dr Janalyn Rouseshaili devashwer  . Malabsorption syndrome   . Migraine   . Migraine   . Passage of loose stools    chronic --  due to short gut syndrome  . Pleuritis   . S/P radioactive iodine thyroid ablation    2007  . SGS (short gut syndrome)   . Short gut syndrome   . Sjogren's disease (HCC)   . Vitiligo   . Wears glasses     Past Surgical History:  Procedure Laterality Date  . BOWEL RESECTION  newborn   necrotizing enterocolitis (liver patched)  . BRONCHOSCOPY  2014  . CESAREAN SECTION  01/02/2012   Procedure: CESAREAN SECTION;  Surgeon: Lenoard Adenichard J Taavon, MD;  Location: [facility];  Service: Gynecology;  Laterality: N/A;  . COLONOSCOPY  07-29-2013  . CYSTECTOMY W/ URETEROILEAL CONDUIT  10/2017  . CYSTOSCOPY WITH BIOPSY N/A 11/23/2014   Procedure: CYSTOSCOPY WITH BLADDER  BIOPSY AND FULGERATION;  Surgeon: Irine Seal, MD;  Location: [facility];  Service: Urology;  Laterality: N/A;  . KNEE ARTHROSCOPY Right 2008  . LIVER SURGERY    . MULTIPLE EXTRACTIONS WITH ALVEOLOPLASTY N/A 03/24/2018   Procedure: EXTRACTIONS X 9;  Surgeon: Diona Browner, DDS;  Location: [place];  Service: Oral Surgery;  Laterality: N/A;  . SMALL INTESTINE SURGERY      Current Outpatient Medications  Medication Sig Dispense Refill  . AMBULATORY NON FORMULARY MEDICATION Iron Infusions every 3 months done at [facility]-  ordered thru [facility]    . cetirizine (ZYRTEC) 5 MG tablet Take 5 mg by mouth daily.    . cevimeline (EVOXAC) 30 MG capsule Take 1 capsule by mouth 3 (three) times daily as needed.    . cyclobenzaprine (FLEXERIL) 10 MG tablet Take 1 tablet (10 mg total) by mouth 2 (two) times daily as needed for muscle spasms. 14 tablet 0  . diazepam (DIASTAT ACUDIAL) 10 MG GEL Place 10 mg rectally at bedtime.    . diphenoxylate-atropine (LOMOTIL) 2.5-0.025 MG tablet One tablet twice a day 60 tablet 0  . eletriptan (RELPAX) 40 MG tablet Take 1 tablet (40 mg total) by mouth as needed for migraine or headache. May repeat in 2 hours if headache persists or recurs. 12 tablet 11  . hydroxychloroquine (PLAQUENIL) 200 MG tablet Take 400 mg by mouth every morning.     . hydroxypropyl methylcellulose / hypromellose (ISOPTO TEARS / GONIOVISC) 2.5 % ophthalmic solution Place 2 drops into both eyes 3 (three) times daily as needed.    Marland Kitchen levothyroxine (SYNTHROID) 100 MCG tablet Take 175 mcg by mouth daily.     Marland Kitchen omalizumab Arvid Right) 150 MG/ML prefilled syringe Inject 150 mg into the skin every 28 (twenty-eight) days.     . OMEPRAZOLE PO Take 20 mg by mouth at bedtime.     . ondansetron (ZOFRAN-ODT) 8 MG disintegrating tablet Take 1 tablet by mouth every 8 (eight) hours as needed.    . predniSONE (DELTASONE) 5 MG tablet Take 1 tablet by mouth daily.    . propranolol ER (INDERAL LA) 80 MG 24 hr capsule Take 1 capsule (80 mg total) by mouth at bedtime. 30 capsule 11  . QUEtiapine (SEROQUEL) 300 MG tablet Take 300 mg by mouth at bedtime.    . sulfamethoxazole-trimethoprim (BACTRIM DS) 800-160 MG tablet Take 1 tablet by mouth 2 (two) times daily.    Marland Kitchen tiZANidine (ZANAFLEX) 4 MG tablet Take 4 mg by mouth every 8 (eight) hours as needed for muscle spasms.     Marland Kitchen White Petrolatum-Mineral Oil ([place] PETROL-MINERAL OIL-LANOLIN) 0.1-0.1 % OINT Place 1 application into both eyes at bedtime.    Marland Kitchen EPINEPHrine 0.3 mg/0.3 mL IJ SOAJ injection  Inject 0.3 mg into the muscle as needed.     No current facility-administered medications for this visit.     Allergies as of 05/04/2019 - Review Complete 05/04/2019  Allergen Reaction Noted  . Penicillins Anaphylaxis 09/06/2011  . Buprenorphine hcl Itching 09/06/2011  . Morphine and related Itching 09/06/2011  . Hydrocodone-acetaminophen Rash 05/21/2017    Family History  Problem Relation Age of Onset  . Diabetes Father   . Endometriosis Mother   . Fibromyalgia Mother   . Heart disease Maternal Grandmother   . Hypertension Maternal Grandmother   .  Osteoporosis Maternal Grandmother   . Heart disease Maternal Grandfather   . Hypertension Maternal Grandfather   . Prostate cancer Maternal Grandfather   . Heart disease Paternal Grandmother   . Other Neg Hx   . Colon cancer Neg Hx   . Pancreatic cancer Neg Hx   . Stomach cancer Neg Hx     Social History   Socioeconomic History  . Marital status: Divorced    Spouse name: Not on file  . Number of children: 1  . Years of education: college  . Highest education level: Not on file  Occupational History  . Occupation: N/A  Social Needs  . Financial resource strain: Not on file  . Food insecurity    Worry: Not on file    Inability: Not on file  . Transportation needs    Medical: Not on file    Non-medical: Not on file  Tobacco Use  . Smoking status: Never Smoker  . Smokeless tobacco: Never Used  Substance and Sexual Activity  . Alcohol use: No    Alcohol/week: 0.0 standard drinks  . Drug use: No  . Sexual activity: Not on file  Lifestyle  . Physical activity    Days per week: Not on file    Minutes per session: Not on file  . Stress: Not on file  Relationships  . Social Musician on phone: Not on file    Gets together: Not on file    Attends religious service: Not on file    Active member of club or organization: Not on file    Attends meetings of clubs or organizations: Not on file    Relationship  status: Not on file  . Intimate partner violence    Fear of current or ex partner: Not on file    Emotionally abused: Not on file    Physically abused: Not on file    Forced sexual activity: Not on file  Other Topics Concern  . Not on file  Social History Narrative   Lives at home with family.   Right-handed.   6 cups tea throughout the week.     Physical Exam: Temp 98.2 F (36.8 C)   Ht 5' 0.5" (1.537 m)   Wt 96 lb 6 oz (43.7 kg)   BMI 18.51 kg/m  Constitutional: generally well-appearing Psychiatric: alert and oriented x3 Eyes: extraocular movements intact Mouth: oral pharynx moist, no lesions Neck: supple no lymphadenopathy Cardiovascular: heart regular rate and rhythm Lungs: clear to auscultation bilaterally Abdomen: soft, nontender, nondistended, no obvious ascites, no peritoneal signs, normal bowel sounds: Right sided ileal conduit ostomy pink and normal-appearing I could not detect a hernia at the site Extremities: no lower extremity edema bilaterally Skin: no lesions on visible extremities   Assessment and plan: 28 y.o. female with epigastric pain with nausea and rarely vomiting  Unclear etiology.  She is relatively immunocompromised with her autoimmune diseases and prednisone, Plaquenil therapy.  This might be simply GERD, acid related and she is not taking her antiacid medicine at the correct time relation to meals and so I recommended she start taking the omeprazole shortly before her breakfast meal and also start taking a Pepcid 20 mg at bedtime every night.  We will proceed with EGD at her soonest convenience to exclude peptic ulcer disease, significant gastritis, H. pylori as causes.   Please see the "Patient Instructions" section for addition details about the plan.   Rob Bunting, MD [facility] 05/04/2019, 3:43 PM  Cc: [name], *

## 2019-05-04 NOTE — Telephone Encounter (Signed)
Noted, unable to get a hold of the patient her mail box is full.

## 2019-05-04 NOTE — Telephone Encounter (Signed)
Please let patient know CT cervical spine was denied, let her keep EMG nerve conduction study on May 18, 2019,

## 2019-05-07 ENCOUNTER — Encounter: Payer: Medicaid Other | Attending: Family Medicine | Admitting: Registered"

## 2019-05-07 ENCOUNTER — Other Ambulatory Visit: Payer: Self-pay

## 2019-05-07 ENCOUNTER — Encounter: Payer: Self-pay | Admitting: Registered"

## 2019-05-07 DIAGNOSIS — R634 Abnormal weight loss: Secondary | ICD-10-CM | POA: Diagnosis not present

## 2019-05-07 NOTE — Patient Instructions (Addendum)
-   Continue having 2 Ensures per day.   - Continue to make medications as prescribed.

## 2019-05-07 NOTE — Progress Notes (Signed)
Medical Nutrition Therapy:  Appt start time: 10:44 end time: 11:27  Patient was seen on 05/07/2019 for nutrition counseling pertaining to disordered eating  Primary care provider: Clyde Lundborg, PA Therapist: Esperanza Heir (sees weekly)  ROI: 10/14/2018 Any other medical team members: none   Assessment:  Primary concerns today: Pt arrives stating she is still trying to gain weight. Recent labs (05/2018) show low Fe (33) and low hemoglobin (11.7).   Pt expectations: something she can do to help gain more weight  Pt arrives stating she has been taking medicine as prescribed. Added taking omeprazole in morning before breakfast. States she wasn't feeling well yesterday and didn't eat as much. Has been having flare-ups recently which she reports is taking a lot of her energy. Feels tired more often. States she is continuing to drink 1-2 Ensures a day.   Previous appts: Pt states she lives with parents and 59 year old daughter. Pt states she was taking MVI and supplements prior to last GI procedure and still vitamin deficient. Was deficient prior to procedures due to absorption and diarrhea. Due to large part of intestines being removed. Pt reports being deficient in B vitamins, fat-soluble vitamins, zinc, and selenium.   Reports taking Lomotil as prescribed before she eats. Takes 1-2 pills per meal depending on size of meal, per pharmacists instructions. Says she doesn't want to have anything with fat in it because she thinks fat is bad. States Lomotil is not working. Reports she had stopped drinking Ensure because it gave her diarrhea, and thinks its because of sugar content. Pt states she uses fruit and fruit juice  in her smoothies which does not give her diarrhea.  Pt reports having feeding tube from infancy to 14.30 years old. Pt reports restricting food since 28 years old; no spicy, no dairy, no greasy foods because it would cause her to vomit.   Pt states she has had over half intestines  removed plus more to help with bladder. Pt states bladder pain has improved but she still has some stomach issues.   Pt reports being able to tolerate well: soup, rice, baked chicken, seafood, not a big fan of meat, fruit. Pt states she also does not tolerate dairy or watermelon well.    Today's weight: 97.2 (+3.1 lbs) from 4 wks ago 94.1  (03/10/2019)  Medical Information:  Changes in hair, skin, nails since ED started: drier skin, some hair loss (believes it is due to medication changes) Chewing/swallowing difficulties: no Relux or heartburn: sometimes: yes, takes reflux medications  Trouble with teeth: no LMP without the use of hormones: 10/01  Weight at that point: 97 lbs Constipation, diarrhea: no, yes (pt states this is normal) Dizziness/lightheadedness: yes, has improved Headaches/body aches: sometimes Heart racing/chest pain: sometimes Mood: stable Sleep: tired some days Focus/concentration: reports some memory challenges Cold intolerance: no Vision changes: no  Mental health diagnosis:   Dietary assessment: A typical day consists of 2-3 meals and 1 snack  Safe foods include: herbal tea, sweet tea, fruit smoothies, fruit, yogurt, rice, shrimp Avoided foods include: red meat, fish, bread, and cashews  24 hour recall:  B (6:15 AM): 4 Tbs vegetable beef soup + Ensure Plus + spirulina powder Snk ( AM):  L (12:30 PM): rice + vegetables  Snk ( PM):  D (6 PM): white beans + okra + 1/2 c  tea S: Ensure Plus  Beverages: water (8 oz), Ensure Plus, tea   Usual physical activity: dance exercises 60 min, biking, walking/running 3  mi, 2x/day  What Methods Do You Use To Control Your Weight (Compensatory behaviors)?           Restricting (calories, fat, carbs)  Food rules or rituals-eats to gain weight,  weighs about 6 times/day to see how weight  fluctuates   Estimated energy intake: 1000-1100 kcals  Estimated energy needs: 2000-2200 calories 225-248g  carbohydrates 150-165 g protein 56-61 g fat  Nutritional Diagnosis:  NB-1.5 Disordered eating pattern As related to skipping meals.  As evidenced by dietary recall.    Intervention:  Nutrition education and counseling. Encouraged pt with increased intake and eating meals/snacks throughout day. Pt was in agreement with goals listed.  Goals: - Continue having 2 Ensures per day.  - Continue to make medications as prescribed.   Teaching Method Utilized:  Visual Auditory Hands on  Handouts given during visit include:  none  Barriers to learning/adherence to lifestyle change: none identified  Demonstrated degree of understanding via:  Teach Back   Monitoring/Evaluation:  Dietary intake, exercise, and body weight in 4 week(s).

## 2019-05-12 ENCOUNTER — Inpatient Hospital Stay: Payer: Medicaid Other | Attending: Hematology and Oncology

## 2019-05-12 ENCOUNTER — Other Ambulatory Visit: Payer: Self-pay

## 2019-05-12 DIAGNOSIS — D500 Iron deficiency anemia secondary to blood loss (chronic): Secondary | ICD-10-CM | POA: Diagnosis not present

## 2019-05-12 LAB — IRON AND TIBC
Iron: 83 ug/dL (ref 41–142)
Saturation Ratios: 31 % (ref 21–57)
TIBC: 264 ug/dL (ref 236–444)
UIBC: 181 ug/dL (ref 120–384)

## 2019-05-12 LAB — CBC WITH DIFFERENTIAL (CANCER CENTER ONLY)
Abs Immature Granulocytes: 0.01 10*3/uL (ref 0.00–0.07)
Basophils Absolute: 0 10*3/uL (ref 0.0–0.1)
Basophils Relative: 0 %
Eosinophils Absolute: 0 10*3/uL (ref 0.0–0.5)
Eosinophils Relative: 1 %
HCT: 38.6 % (ref 36.0–46.0)
Hemoglobin: 12.4 g/dL (ref 12.0–15.0)
Immature Granulocytes: 0 %
Lymphocytes Relative: 39 %
Lymphs Abs: 1.6 10*3/uL (ref 0.7–4.0)
MCH: 29.7 pg (ref 26.0–34.0)
MCHC: 32.1 g/dL (ref 30.0–36.0)
MCV: 92.6 fL (ref 80.0–100.0)
Monocytes Absolute: 0.3 10*3/uL (ref 0.1–1.0)
Monocytes Relative: 7 %
Neutro Abs: 2.1 10*3/uL (ref 1.7–7.7)
Neutrophils Relative %: 53 %
Platelet Count: 160 10*3/uL (ref 150–400)
RBC: 4.17 MIL/uL (ref 3.87–5.11)
RDW: 13.1 % (ref 11.5–15.5)
WBC Count: 4 10*3/uL (ref 4.0–10.5)
nRBC: 0 % (ref 0.0–0.2)

## 2019-05-12 LAB — FERRITIN: Ferritin: 95 ng/mL (ref 11–307)

## 2019-05-13 ENCOUNTER — Telehealth: Payer: Self-pay | Admitting: Hematology and Oncology

## 2019-05-13 ENCOUNTER — Ambulatory Visit (INDEPENDENT_AMBULATORY_CARE_PROVIDER_SITE_OTHER): Payer: Medicaid Other

## 2019-05-13 ENCOUNTER — Other Ambulatory Visit: Payer: Self-pay | Admitting: Gastroenterology

## 2019-05-13 DIAGNOSIS — Z1159 Encounter for screening for other viral diseases: Secondary | ICD-10-CM

## 2019-05-13 NOTE — Progress Notes (Signed)
HEMATOLOGY-ONCOLOGY WEBEX VISIT PROGRESS NOTE  I connected with [name] on 05/14/2019 at  9:15 AM EST by Webex video conference and verified that I am speaking with the correct person using two identifiers.  I discussed the limitations, risks, security and privacy concerns of performing an evaluation and management service by Webex and the availability of in person appointments.  I also discussed with the patient that there may be a patient responsible charge related to this service. The patient expressed understanding and agreed to proceed.  Patient's Location: Home Physician Location: Clinic  CHIEF COMPLIANT: Follow-up of iron deficiency anemia due to chronic blood loss  INTERVAL HISTORY: [name] is a 28 y.o. female with above-mentioned history of iron deficiency anemia due to chronic blood loss. I saw her last 6 months ago. Her most recent labs from 05/12/19 showed: Hg 12.4, hematocrit 38.6, iron saturation 31%, and ferritin 95. She presents today over Webex to review her recent lab work.   She has not had any new problems or concerns with bleeding.  REVIEW OF SYSTEMS:   Constitutional: Denies fevers, chills or abnormal weight loss Eyes: Denies blurriness of vision Ears, nose, mouth, throat, and face: Denies mucositis or sore throat Respiratory: Denies cough, dyspnea or wheezes Cardiovascular: Denies palpitation, chest discomfort Gastrointestinal:  Denies nausea, heartburn or change in bowel habits Skin: Denies abnormal skin rashes Lymphatics: Denies new lymphadenopathy or easy bruising Neurological:Denies numbness, tingling or new weaknesses Behavioral/Psych: Mood is stable, no new changes  Extremities: No lower extremity edema Breast: denies any pain or lumps or nodules in either breasts All other systems were reviewed with the patient and are negative.  Observations/Objective:  There were no vitals filed for this visit. There is no height or weight on  file to calculate BMI.  I have reviewed the data as listed CMP Latest Ref Rng & Units 07/24/2017 03/08/2017 03/07/2016  Glucose 70 - 99 mg/dL 412(I) 74 88  BUN 6 - 23 mg/dL 11 8 11   Creatinine 0.40 - 1.20 mg/dL 7.86 7.67  Sodium 135 - 145 mEq/L 139 137 139  Potassium 3.5 - 5.1 mEq/L 3.4(L) 3.5 3.7  Chloride 96 - 112 mEq/L 109 105 107  CO2 19 - 32 mEq/L 17(L) 24 28  Calcium 8.4 - 10.5 mg/dL 9.3 9.8 9.0  Total Protein 6.0 - 8.3 g/dL 7.5 - -  Total Bilirubin 0.2 - 1.2 mg/dL 2.09) - -  Alkaline Phos 39 - 117 U/L 57 - -  AST 0 - 37 U/L 19 - -  ALT 0 - 35 U/L 18 - -    Lab Results  Component Value Date   WBC 4.0 05/12/2019   HGB 12.4 05/12/2019   HCT 38.6 05/12/2019   MCV 92.6 05/12/2019   PLT 160 05/12/2019   NEUTROABS 2.1 05/12/2019      Assessment Plan:  Iron deficiency anemia due to chronic blood loss Patient has had heavy menstrual bleeding accompanied by urinary tract bleeding from chronic interstitial cystitis. She has been on oral iron therapy since 2012 although at times she was taken off iron supplementation. Since February she has been anemic with a hemoglobin of 10.6-10.9. In spite of being on oral iron treatment her hemoglobin has remained low. She continuedto have symptoms related to iron deficiency with fatigue, ice craving, headaches etc. all of these symptoms improved with IV iron treatment given July 2017  Treatment summary: IV iron given July 2017, May 2019, November 2019  Lab review: 11/05/2018: Hemoglobin 12.3,  MCV 90.5 Iron studies: Ferritin 189, TIBC 255, iron saturation 27% 05/12/19: Hb 12.4, MCV: 92.6, Ferritin: 95, Iron sat: 31%  Based on these results she does not need any IV iron. Return to clinic in 1 year with labs and follow-up.     I discussed the assessment and treatment plan with the patient. The patient was provided an opportunity to ask questions and all were answered. The patient agreed with the plan and demonstrated an understanding of  the instructions. The patient was advised to call back or seek an in-person evaluation if the symptoms worsen or if the condition fails to improve as anticipated.   I provided 12 minutes of face-to-face Web Ex time during this encounter.    Rulon Eisenmenger, MD 05/14/2019    I, Cloyde Reams Dorshimer, am acting as scribe for Nicholas Lose, MD.  I have reviewed the above documentation for accuracy and completeness, and I agree with the above.

## 2019-05-13 NOTE — Telephone Encounter (Signed)
Called patient regarding upcoming Webex appointment, patient is notified and e-mail has been sent. °

## 2019-05-14 ENCOUNTER — Inpatient Hospital Stay (HOSPITAL_BASED_OUTPATIENT_CLINIC_OR_DEPARTMENT_OTHER): Payer: Medicaid Other | Admitting: Hematology and Oncology

## 2019-05-14 DIAGNOSIS — D500 Iron deficiency anemia secondary to blood loss (chronic): Secondary | ICD-10-CM

## 2019-05-14 LAB — SARS CORONAVIRUS 2 (TAT 6-24 HRS): SARS Coronavirus 2: NEGATIVE

## 2019-05-14 NOTE — Assessment & Plan Note (Signed)
Patient has had heavy menstrual bleeding accompanied by urinary tract bleeding from chronic interstitial cystitis. She has been on oral iron therapy since 2012 although at times she was taken off iron supplementation. Since February she has been anemic with a hemoglobin of 10.6-10.9. In spite of being on oral iron treatment her hemoglobin has remained low. She continuedto have symptoms related to iron deficiency with fatigue, ice craving, headaches etc. all of these symptoms improved with IV iron treatment given July 2017  Treatment summary: IV iron given July 2017, May 2019, November 2019  Lab review: 11/05/2018: Hemoglobin 12.3, MCV 90.5 Iron studies: Ferritin 189, TIBC 255, iron saturation 27% 05/12/19: Hb 12.4, MCV: 92.6, Ferritin: 95, Iron sat: 31%  Based on these results she does not need any IV iron. Return to clinic in 6 months with labs and follow-up.

## 2019-05-15 ENCOUNTER — Ambulatory Visit (AMBULATORY_SURGERY_CENTER): Payer: Medicaid Other | Admitting: Gastroenterology

## 2019-05-15 ENCOUNTER — Encounter: Payer: Self-pay | Admitting: Gastroenterology

## 2019-05-15 ENCOUNTER — Telehealth: Payer: Self-pay | Admitting: Hematology and Oncology

## 2019-05-15 ENCOUNTER — Other Ambulatory Visit: Payer: Self-pay

## 2019-05-15 VITALS — BP 113/78 | HR 63 | Temp 98.4°F | Resp 27 | Ht 60.0 in | Wt 96.0 lb

## 2019-05-15 DIAGNOSIS — K2950 Unspecified chronic gastritis without bleeding: Secondary | ICD-10-CM | POA: Diagnosis not present

## 2019-05-15 DIAGNOSIS — R1013 Epigastric pain: Secondary | ICD-10-CM

## 2019-05-15 MED ORDER — SODIUM CHLORIDE 0.9 % IV SOLN: 500.0000 mL | INTRAVENOUS | Status: DC

## 2019-05-15 NOTE — Progress Notes (Signed)
A and O x3. Report to RN. Tolerated MAC anesthesia well.Teeth unchanged after procedure.

## 2019-05-15 NOTE — Progress Notes (Signed)
Temp JB  vs CW 

## 2019-05-15 NOTE — Telephone Encounter (Signed)
I talk with patient regarding schedule  

## 2019-05-15 NOTE — Progress Notes (Signed)
Called to room to assist during endoscopic procedure.  Patient ID and intended procedure confirmed with present staff. Received instructions for my participation in the procedure from the performing physician.  

## 2019-05-15 NOTE — Op Note (Signed)
[facility] Patient Name: [name] Procedure Date: 05/15/2019 10:11 AM MRN: [id number] Endoscopist: Milus Banister , MD Age: 28 Referring MD:  Date of Birth: 07/22/1990 Gender: Female Account #: [id number] Procedure:                Upper GI endoscopy Indications:              Epigastric abdominal pain, nausea, intermittent                            vomiting Medicines:                Monitored Anesthesia Care Procedure:                Pre-Anesthesia Assessment:                           - Prior to the procedure, a History and Physical                            was performed, and patient medications and                            allergies were reviewed. The patient's tolerance of                            previous anesthesia was also reviewed. The risks                            and benefits of the procedure and the sedation                            options and risks were discussed with the patient.                            All questions were answered, and informed consent                            was obtained. Prior Anticoagulants: The patient has                            taken no previous anticoagulant or antiplatelet                            agents. ASA Grade Assessment: III - A patient with                            severe systemic disease. After reviewing the risks                            and benefits, the patient was deemed in                            satisfactory condition to undergo the procedure.  After obtaining informed consent, the endoscope was                            passed under direct vision. Throughout the                            procedure, the patient's blood pressure, pulse, and                            oxygen saturations were monitored continuously. The                            Endoscope was introduced through the mouth, and                            advanced to the second part of duodenum.  The upper                            GI endoscopy was accomplished without difficulty.                            The patient tolerated the procedure well. Scope In: Scope Out: Findings:                 Minimal inflammation characterized by erythema was                            found in the gastric antrum. Biopsies were taken                            with a cold forceps for histology.                           The exam was otherwise without abnormality. Complications:            No immediate complications. Estimated blood loss:                            None. Estimated Blood Loss:     Estimated blood loss: none. Impression:               - Very mild gastritis. Biopsied to check for H.                            pylori.                           - The examination was otherwise normal. Recommendation:           - Patient has a contact number available for                            emergencies. The signs and symptoms of potential                            delayed complications were discussed with the  patient. Return to normal activities tomorrow.                            Written discharge instructions were provided to the                            patient.                           - Resume previous diet.                           - Continue present medications.                           - Await pathology results. Rachael Fee, MD 05/15/2019 10:21:22 AM This report has been signed electronically.

## 2019-05-15 NOTE — Patient Instructions (Signed)
Information on gastritis given to you today.  Await pathology results.  YOU HAD AN ENDOSCOPIC PROCEDURE TODAY AT THE [facility]:   Refer to the procedure report that was given to you for any specific questions about what was found during the examination.  If the procedure report does not answer your questions, please call your gastroenterologist to clarify.  If you requested that your care partner not be given the details of your procedure findings, then the procedure report has been included in a sealed envelope for you to review at your convenience later.  YOU SHOULD EXPECT: Some feelings of bloating in the abdomen. Passage of more gas than usual.  Walking can help get rid of the air that was put into your GI tract during the procedure and reduce the bloating. If you had a lower endoscopy (such as a colonoscopy or flexible sigmoidoscopy) you may notice spotting of blood in your stool or on the toilet paper. If you underwent a bowel prep for your procedure, you may not have a normal bowel movement for a few days.  Please Note:  You might notice some irritation and congestion in your nose or some drainage.  This is from the oxygen used during your procedure.  There is no need for concern and it should clear up in a day or so.  SYMPTOMS TO REPORT IMMEDIATELY:   Following upper endoscopy (EGD)  Vomiting of blood or coffee ground material  New chest pain or pain under the shoulder blades  Painful or persistently difficult swallowing  New shortness of breath  Fever of 100F or higher  Black, tarry-looking stools  For urgent or emergent issues, a gastroenterologist can be reached at any hour by calling [phone].   DIET:  We do recommend a small meal at first, but then you may proceed to your regular diet.  Drink plenty of fluids but you should avoid alcoholic beverages for 24 hours.  ACTIVITY:  You should plan to take it easy for the rest of today and you should NOT DRIVE or  use heavy machinery until tomorrow (because of the sedation medicines used during the test).    FOLLOW UP: Our staff will call the number listed on your records 48-72 hours following your procedure to check on you and address any questions or concerns that you may have regarding the information given to you following your procedure. If we do not reach you, we will leave a message.  We will attempt to reach you two times.  During this call, we will ask if you have developed any symptoms of COVID 19. If you develop any symptoms (ie: fever, flu-like symptoms, shortness of breath, cough etc.) before then, please call [phone].  If you test positive for Covid 19 in the 2 weeks post procedure, please call and report this information to us.    If any biopsies were taken you will be contacted by phone or by letter within the next 1-3 weeks.  Please call us at [phone] if you have not heard about the biopsies in 3 weeks.    SIGNATURES/CONFIDENTIALITY: You and/or your care partner have signed paperwork which will be entered into your electronic medical record.  These signatures attest to the fact that that the information above on your After Visit Summary has been reviewed and is understood.  Full responsibility of the confidentiality of this discharge information lies with you and/or your care-partner. 

## 2019-05-18 ENCOUNTER — Ambulatory Visit (INDEPENDENT_AMBULATORY_CARE_PROVIDER_SITE_OTHER): Payer: Medicaid Other | Admitting: Neurology

## 2019-05-18 ENCOUNTER — Other Ambulatory Visit: Payer: Self-pay

## 2019-05-18 ENCOUNTER — Encounter: Payer: Medicaid Other | Admitting: Neurology

## 2019-05-18 DIAGNOSIS — IMO0002 Reserved for concepts with insufficient information to code with codable children: Secondary | ICD-10-CM

## 2019-05-18 DIAGNOSIS — R202 Paresthesia of skin: Secondary | ICD-10-CM

## 2019-05-18 DIAGNOSIS — Z0289 Encounter for other administrative examinations: Secondary | ICD-10-CM

## 2019-05-18 DIAGNOSIS — G43709 Chronic migraine without aura, not intractable, without status migrainosus: Secondary | ICD-10-CM

## 2019-05-18 NOTE — Procedures (Signed)
Full Name: [name] Gender: Female MRN #: [id number] Date of Birth: [birth year]    Visit Date: 05/18/2019 09:09 Age: 28 Years 8 Months Old Examining Physician: Levert Feinstein, MD  Referring Physician: Levert Feinstein, MD History:   29 year old female with intermittent bilateral upper and lower extremity paresthesia.  Summary of the tests:  Nerve conduction study: Bilateral median, ulnar sensory and motor responses were normal.  Right peroneal, tibial motor responses were normal.  Right sural, superficial peroneal sensory responses were normal.  Electromyography: Selective needle examinations of right upper and lower extremity muscles were normal.  Conclusion: This is a normal study.  There is no electrodiagnostic evidence of right upper, or lower extremity neuropathy, there is no evidence of right cervical or lumbar sacral radiculopathy.    ------------------------------- Levert Feinstein, M.D. PhD  [facility] [address] [place], [place] [address] Tel: [phone] Fax: [phone]        [facility]    Nerve / Sites Muscle Latency Ref. Amplitude Ref. Rel Amp Segments Distance Velocity Ref. Area    ms ms mV mV %  cm m/s m/s mVms  L Median - APB     Wrist APB 3.1 ?4.4 8.2 ?4.0 100 Wrist - APB 7   29.4     Upper arm APB 6.4  8.2  100 Upper arm - Wrist 20 61 ?49 29.2  R Median - APB     Wrist APB 3.3 ?4.4 10.7 ?4.0 100 Wrist - APB 7   33.0     Upper arm APB 6.7  9.6  90.4 Upper arm - Wrist 19 56 ?49 30.9  L Ulnar - ADM     Wrist ADM 2.9 ?3.3 11.0 ?6.0 100 Wrist - ADM 7   36.1     B.Elbow ADM 5.8  10.8  98.6 B.Elbow - Wrist 17 58 ?49 36.1     A.Elbow ADM 7.8  10.0  92.4 A.Elbow - B.Elbow 10 51 ?49 35.2         A.Elbow - Wrist      R Ulnar - ADM     Wrist ADM 2.7 ?3.3 9.6 ?6.0 100 Wrist - ADM 7   27.3     B.Elbow ADM 5.4  8.3  86.8 B.Elbow - Wrist 17 63 ?49 27.9     A.Elbow ADM 7.0  7.6  91.1 A.Elbow - B.Elbow 10 60 ?49 27.6         A.Elbow - Wrist      R  Peroneal - EDB     Ankle EDB 4.7 ?6.5 6.5 ?2.0 100 Ankle - EDB 9   22.7     Fib head EDB 9.6  6.1  93.6 Fib head - Ankle 25 52 ?44 22.1     Pop fossa EDB 11.4  6.0  98.8 Pop fossa - Fib head 10 56 ?44 21.3         Pop fossa - Ankle      R Tibial - AH     Ankle AH 3.6 ?5.8 16.0 ?4.0 100 Ankle - AH 9   35.5     Pop fossa AH 10.8  13.2  82.2 Pop fossa - Ankle 33 46 ?41 34.1                  SNC    Nerve / Sites Rec. Site Peak Lat Ref.  Amp Ref. Segments Distance    ms ms V V  cm  R  Sural - Ankle (Calf)     Calf Ankle 3.5 ?4.4 17 ?6 Calf - Ankle 14  R Superficial peroneal - Ankle     Lat leg Ankle 3.8 ?4.4 7 ?6 Lat leg - Ankle 14  L Median - Orthodromic (Dig II, Mid palm)     Dig II Wrist 2.9 ?3.4 12 ?10 Dig II - Wrist 13  R Median - Orthodromic (Dig II, Mid palm)     Dig II Wrist 2.9 ?3.4 18 ?10 Dig II - Wrist 13  L Ulnar - Orthodromic, (Dig V, Mid palm)     Dig V Wrist 2.8 ?3.1 13 ?5 Dig V - Wrist 11  R Ulnar - Orthodromic, (Dig V, Mid palm)     Dig V Wrist 2.8 ?3.1 12 ?5 Dig V - Wrist 34                 F  Wave    Nerve F Lat Ref.   ms ms  L Ulnar - ADM 25.0 ?32.0  R Ulnar - ADM 24.8 ?32.0  R Tibial - AH 41.7 ?56.0           EMG       EMG Summary Table    Spontaneous MUAP Recruitment  Muscle IA Fib PSW Fasc Other Amp Dur. Poly Pattern  R. Tibialis anterior Normal None None None _______ Normal Normal Normal Normal  R. Tibialis posterior Normal None None None _______ Normal Normal Normal Normal  R. Peroneus longus Normal None None None _______ Normal Normal Normal Normal  R. Gastrocnemius (Medial head) Normal None None None _______ Normal Normal Normal Normal  R. Vastus lateralis Normal None None None _______ Normal Normal Normal Normal  R. First dorsal interosseous Normal None None None _______ Normal Normal Normal Normal  R. Pronator teres Normal None None None _______ Normal Normal Normal Normal  R. Biceps brachii Normal None None None _______ Normal Normal Normal  Normal  R. Deltoid Normal None None None _______ Normal Normal Normal Normal

## 2019-05-19 ENCOUNTER — Telehealth: Payer: Self-pay | Admitting: *Deleted

## 2019-05-19 NOTE — Telephone Encounter (Signed)
  Follow up Call-  Call back number 05/15/2019  Post procedure Call Back phone  # [phone]  Permission to leave phone message Yes  Some recent data might be hidden     Patient questions:  Do you have a fever, pain , or abdominal swelling? No. Pain Score  0 *  Have you tolerated food without any problems? Yes.    Have you been able to return to your normal activities? Yes.    Do you have any questions about your discharge instructions: Diet   No. Medications  No. Follow up visit  No.  Do you have questions or concerns about your Care? No.  Actions: * If pain score is 4 or above: No action needed, pain <4.

## 2019-06-09 ENCOUNTER — Other Ambulatory Visit: Payer: Self-pay

## 2019-06-09 ENCOUNTER — Encounter: Payer: Self-pay | Admitting: Registered"

## 2019-06-09 ENCOUNTER — Encounter: Payer: Medicaid Other | Attending: Family Medicine | Admitting: Registered"

## 2019-06-09 DIAGNOSIS — R634 Abnormal weight loss: Secondary | ICD-10-CM | POA: Diagnosis not present

## 2019-06-09 NOTE — Progress Notes (Signed)
Medical Nutrition Therapy:  Appt start time: 8:06 end time: 8:58  Patient was seen on 06/09/2019 for nutrition counseling pertaining to disordered eating  Primary care provider: Clyde Lundborg, PA Therapist: Esperanza Heir (sees weekly)  ROI: 10/14/2018 Any other medical team members: none   Assessment:  Primary concerns today: Pt arrives stating she is still trying to gain weight. Recent labs (05/2018) show low Fe (33) and low hemoglobin (11.7).   Pt expectations: something she can do to help gain more weight  Pt states she has been having issues with her gallbladder. States her gallbladder is pressing on her stomach causing swelling with her stomach. States it causes her to feel nauseous all day. Waiting to see how surgeon will like to proceed forward. States it could possibly be gallstones that have been growing since 2008. States she is not concerned about having side effects with cholecystectomy.   Has been drinking more Ensure because food has been making her stomach hurt. Reports having a new puppy, Simba.   States being small works against her. Family does not allow her freedom to do things on her own.   Previous appts: Pt states she lives with parents and 30 year old daughter. Pt states she was taking MVI and supplements prior to last GI procedure and still vitamin deficient. Was deficient prior to procedures due to absorption and diarrhea. Due to large part of intestines being removed. Pt reports being deficient in B vitamins, fat-soluble vitamins, zinc, and selenium.   Reports taking Lomotil as prescribed before she eats. Takes 1-2 pills per meal depending on size of meal, per pharmacists instructions. Says she doesn't want to have anything with fat in it because she thinks fat is bad. States Lomotil is not working. Reports she had stopped drinking Ensure because it gave her diarrhea, and thinks its because of sugar content. Pt states she uses fruit and fruit juice  in her  smoothies which does not give her diarrhea.  Pt reports having feeding tube from infancy to 28 years old. Pt reports restricting food since 28 years old; no spicy, no dairy, no greasy foods because it would cause her to vomit.   Pt states she has had over half intestines removed plus more to help with bladder. Pt states bladder pain has improved but she still has some stomach issues.   Pt reports being able to tolerate well: soup, rice, baked chicken, seafood, not a big fan of meat, fruit. Pt states she also does not tolerate dairy or watermelon well.    Today's weight: 95.4 (-1.8 lbs) from 4 wks ago 97.2  (05/07/2019)  Medical Information:  Changes in hair, skin, nails since ED started: drier skin, some hair loss (believes it is due to medication changes) Chewing/swallowing difficulties: no Relux or heartburn: sometimes: yes, takes reflux medications  Trouble with teeth: no LMP without the use of hormones: 10/01  Weight at that point: 97 lbs Constipation, diarrhea: no, yes (pt states this is normal) Dizziness/lightheadedness: yes, has improved Headaches/body aches: sometimes Heart racing/chest pain: sometimes Mood: stable Sleep: tired some days Focus/concentration: feels more alert Cold intolerance: no Vision changes: no  Mental health diagnosis:   Dietary assessment: A typical day consists of 2-3 meals and 1 snack  Safe foods include: herbal tea, sweet tea, fruit smoothies, fruit, yogurt, rice, shrimp Avoided foods include: red meat, fish, bread, and cashews  24 hour recall:  S (4 AM): Ensure Plus B (6:15 AM): orange + kiwi + peach Activia yogurt  Snk ( AM):  L (12:30 PM): 1/4 plate steak tips + 1/4 plate broccoli Snk (4 PM): Ensure Plus D (5 PM): egg noodles + gravy + egg + chicken + cream of chicken  S (8 pm): egg noodles + gravy + egg + chicken + cream of chicken  S: (11:30 PM): egg noodles + gravy + egg + chicken + cream of chicken + Ensure Plus  Beverages: water (8  oz), Ensure Plus, tea   Usual physical activity: dance exercises 60 min, biking, walking/running 3 mi, 2x/day  What Methods Do You Use To Control Your Weight (Compensatory behaviors)?           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  As evidenced by dietary recall.    Intervention:  Nutrition education and counseling.  Encouraged pt with increased intake and eating meals/snacks throughout day. Pt is doing well!  Teaching Method Utilized:  Visual Auditory Hands on  Handouts given during visit include:  none  Barriers to learning/adherence to lifestyle change: none identified  Demonstrated degree of understanding via:  Teach Back   Monitoring/Evaluation:  Dietary intake, exercise, and body weight in 4 week(s).

## 2019-06-11 ENCOUNTER — Telehealth: Payer: Self-pay | Admitting: Gastroenterology

## 2019-06-11 NOTE — Telephone Encounter (Signed)
I spoke with the pt and she has continued pain in the abd and epigastric pain.  I called [facility] to inquire about the appt referral.  I left a message and will wait for Sarah at [facility] to return call.  The pt was advised to have a low fat diet and call back or go to urgent care or ED if the pain becomes severe.

## 2019-06-18 ENCOUNTER — Other Ambulatory Visit: Payer: Self-pay | Admitting: Surgery

## 2019-06-18 DIAGNOSIS — K8020 Calculus of gallbladder without cholecystitis without obstruction: Secondary | ICD-10-CM

## 2019-06-30 ENCOUNTER — Ambulatory Visit
Admission: RE | Admit: 2019-06-30 | Discharge: 2019-06-30 | Disposition: A | Discharge status: Home / Self Care | Payer: Medicaid Other | Source: Ambulatory Visit | Attending: Surgery | Admitting: Surgery

## 2019-06-30 DIAGNOSIS — K8020 Calculus of gallbladder without cholecystitis without obstruction: Secondary | ICD-10-CM

## 2019-07-05 ENCOUNTER — Encounter (HOSPITAL_BASED_OUTPATIENT_CLINIC_OR_DEPARTMENT_OTHER): Payer: Self-pay | Admitting: Emergency Medicine

## 2019-07-05 ENCOUNTER — Other Ambulatory Visit: Payer: Self-pay

## 2019-07-05 ENCOUNTER — Emergency Department (HOSPITAL_BASED_OUTPATIENT_CLINIC_OR_DEPARTMENT_OTHER)
Admission: EM | Admit: 2019-07-05 | Discharge: 2019-07-06 | Disposition: A | Discharge status: Home / Self Care | Payer: Medicaid Other | Attending: Emergency Medicine | Admitting: Emergency Medicine

## 2019-07-05 ENCOUNTER — Emergency Department (HOSPITAL_BASED_OUTPATIENT_CLINIC_OR_DEPARTMENT_OTHER): Payer: Medicaid Other

## 2019-07-05 DIAGNOSIS — N12 Tubulo-interstitial nephritis, not specified as acute or chronic: Secondary | ICD-10-CM | POA: Insufficient documentation

## 2019-07-05 DIAGNOSIS — E039 Hypothyroidism, unspecified: Secondary | ICD-10-CM | POA: Diagnosis not present

## 2019-07-05 DIAGNOSIS — R1032 Left lower quadrant pain: Secondary | ICD-10-CM | POA: Diagnosis present

## 2019-07-05 DIAGNOSIS — Z79899 Other long term (current) drug therapy: Secondary | ICD-10-CM | POA: Diagnosis not present

## 2019-07-05 LAB — COMPREHENSIVE METABOLIC PANEL
ALT: 11 U/L (ref 0–44)
AST: 14 U/L — ABNORMAL LOW (ref 15–41)
Albumin: 3.6 g/dL (ref 3.5–5.0)
Alkaline Phosphatase: 73 U/L (ref 38–126)
Anion gap: 9 (ref 5–15)
BUN: 7 mg/dL (ref 6–20)
CO2: 21 mmol/L — ABNORMAL LOW (ref 22–32)
Calcium: 8.9 mg/dL (ref 8.9–10.3)
Chloride: 104 mmol/L (ref 98–111)
Creatinine, Ser: 0.68 mg/dL (ref 0.44–1.00)
GFR calc Af Amer: >60 mL/min (ref >60)
GFR calc non Af Amer: >60 mL/min (ref >60)
Glucose, Bld: 120 mg/dL — ABNORMAL HIGH (ref 70–99)
Potassium: 2.9 mmol/L — ABNORMAL LOW (ref 3.5–5.1)
Sodium: 134 mmol/L — ABNORMAL LOW (ref 135–145)
Total Bilirubin: 2.4 mg/dL — ABNORMAL HIGH (ref 0.3–1.2)
Total Protein: 7.2 g/dL (ref 6.5–8.1)

## 2019-07-05 LAB — URINALYSIS, ROUTINE W REFLEX MICROSCOPIC
Bilirubin Urine: NEGATIVE
Glucose, UA: NEGATIVE mg/dL
Ketones, ur: NEGATIVE mg/dL
Nitrite: POSITIVE — AB
Protein, ur: 100 mg/dL — AB
Specific Gravity, Urine: 1.01 (ref 1.005–1.030)
pH: 7 (ref 5.0–8.0)

## 2019-07-05 LAB — CBC
HCT: 36.9 % (ref 36.0–46.0)
Hemoglobin: 12 g/dL (ref 12.0–15.0)
MCH: 29.5 pg (ref 26.0–34.0)
MCHC: 32.5 g/dL (ref 30.0–36.0)
MCV: 90.7 fL (ref 80.0–100.0)
Platelets: 200 10*3/uL (ref 150–400)
RBC: 4.07 MIL/uL (ref 3.87–5.11)
RDW: 12.6 % (ref 11.5–15.5)
WBC: 8.7 10*3/uL (ref 4.0–10.5)
nRBC: 0 % (ref 0.0–0.2)

## 2019-07-05 LAB — URINALYSIS, MICROSCOPIC (REFLEX): WBC, UA: >50 WBC/hpf (ref 0–5)

## 2019-07-05 LAB — LIPASE, BLOOD: Lipase: 28 U/L (ref 11–51)

## 2019-07-05 LAB — LACTIC ACID, PLASMA: Lactic Acid, Venous: 1.1 mmol/L (ref 0.5–1.9)

## 2019-07-05 LAB — PREGNANCY, URINE: Preg Test, Ur: NEGATIVE

## 2019-07-05 MED ORDER — IOHEXOL 300 MG/ML  SOLN
100.0000 mL | Freq: Once | INTRAMUSCULAR | Status: AC | PRN
  Administered 2019-07-06: 100 mL via INTRAVENOUS

## 2019-07-05 MED ORDER — FENTANYL CITRATE (PF) 100 MCG/2ML IJ SOLN
50.0000 ug | Freq: Once | INTRAMUSCULAR | Status: AC
  Administered 2019-07-05: 50 ug via INTRAVENOUS
  Filled 2019-07-05: qty 2

## 2019-07-05 MED ORDER — ACETAMINOPHEN 500 MG PO TABS
1000.0000 mg | ORAL_TABLET | Freq: Once | ORAL | Status: AC
  Administered 2019-07-05: 1000 mg via ORAL
  Filled 2019-07-05: qty 2

## 2019-07-05 MED ORDER — DIPHENHYDRAMINE HCL 50 MG/ML IJ SOLN
12.5000 mg | Freq: Once | INTRAMUSCULAR | Status: AC
  Administered 2019-07-05: 12.5 mg via INTRAVENOUS
  Filled 2019-07-05: qty 1

## 2019-07-05 MED ORDER — SODIUM CHLORIDE 0.9 % IV BOLUS
1000.0000 mL | Freq: Once | INTRAVENOUS | Status: AC
  Administered 2019-07-05: 1000 mL via INTRAVENOUS

## 2019-07-05 MED ORDER — CIPROFLOXACIN IN D5W 400 MG/200ML IV SOLN
400.0000 mg | Freq: Once | INTRAVENOUS | Status: AC
  Administered 2019-07-05: 400 mg via INTRAVENOUS
  Filled 2019-07-05: qty 200

## 2019-07-05 NOTE — ED Notes (Signed)
Pt offered ibuprofen for fever. Pt states she is unable to take ibuprofen due to anemia. Pt last had APAP around 1500.

## 2019-07-05 NOTE — ED Notes (Signed)
1 set of blood cultures drawn and held at this time.

## 2019-07-05 NOTE — ED Triage Notes (Signed)
Low abd pain and fever today. Took tylenol  330pm.

## 2019-07-06 MED ORDER — CIPROFLOXACIN HCL 500 MG PO TABS: 500.0000 mg | ORAL_TABLET | Freq: Two times a day (BID) | ORAL | 0 refills | Status: DC

## 2019-07-06 MED ORDER — POTASSIUM CHLORIDE CRYS ER 20 MEQ PO TBCR
40.0000 meq | EXTENDED_RELEASE_TABLET | Freq: Once | ORAL | Status: AC
  Administered 2019-07-06: 40 meq via ORAL
  Filled 2019-07-06: qty 2

## 2019-07-06 NOTE — ED Provider Notes (Signed)
[facility] EMERGENCY DEPARTMENT Provider Note   CSN: [id number] Arrival date & time: 07/05/19  1824     History Chief Complaint  Patient presents with  . Abdominal Pain    [name] is a 29 y.o. female.  HPI     This is a 29 year old female with a history of interstitial cystitis status post urostomy, vitiligo, Graves' disease, lupus who presents with fever and abdominal pain.  Patient reports that she has had some short intermittent abdominal pain over the last 1 to 2 days.  She states that is mostly on the left and in the left flank area.  It does not radiate.  Kind of comes and goes.  It does not seem to be worsened by anything.  She is not noticed any exacerbating or alleviating factors.  No nausea, vomiting, diarrhea.  However, today she noticed that she had a temperature of 103 at home.  She has had a urostomy and noted that her urine became more cloudy today.  She denies any other infectious symptoms including cough, shortness of breath, upper respiratory symptoms.  Denies loss of sense of taste or smell.  No known sick contacts or Covid exposures.  Currently she is not having any abdominal pain.  Past Medical History:  Diagnosis Date  . Anemia   . Anxiety   . Arthritis   . Bilateral hearing loss   . Bladder mass   . Chronic interstitial cystitis   . Chronic interstitial cystitis with hematuria    2016  . Chronic sinusitis   . Fibromyalgia   . GERD (gastroesophageal reflux disease)   . Gross hematuria   . History of Graves' disease    tx done in 2007  . History of migraine   . History of recurrent UTIs   . Hypothyroidism   . Iron deficiency anemia    due to SGS  . Lower urinary tract symptoms (LUTS)   . Lupus [facility])    rheumologist--  dr Janalyn Rouse devashwer  . Malabsorption syndrome   . Migraine   . Migraine   . Passage of loose stools    chronic --  due to short gut syndrome  . Pleuritis   . S/P radioactive iodine thyroid ablation    2007    . SGS (short gut syndrome)   . Short gut syndrome   . Sjogren's disease (HCC)   . Vitiligo   . Wears glasses     Patient Active Problem List   Diagnosis Date Noted  . Paresthesia 04/07/2019  . Chronic migraine 04/07/2019  . Hypersensitivity reaction 01/13/2016  . Iron deficiency anemia due to chronic blood loss 01/05/2016  . Chronic interstitial cystitis with hematuria   . H/O Graves' disease 07/22/2013  . Sjogren's disease (HCC) 07/22/2013  . Malabsorption syndrome 07/22/2013  . Migraine with aura 07/22/2013  . Pelvic adhesive disease 07/22/2013  . Hypothyroidism complicating pregnancy / delivered 12/28/2011    Past Surgical History:  Procedure Laterality Date  . BOWEL RESECTION  newborn   necrotizing enterocolitis (liver patched)  . BRONCHOSCOPY  2014  . CESAREAN SECTION  01/02/2012   Procedure: CESAREAN SECTION;  Surgeon: Lenoard Aden, MD;  Location: [facility];  Service: Gynecology;  Laterality: N/A;  . COLONOSCOPY  07-29-2013  . CYSTECTOMY W/ URETEROILEAL CONDUIT  10/2017  . CYSTOSCOPY WITH BIOPSY N/A 11/23/2014   Procedure: CYSTOSCOPY WITH BLADDER  BIOPSY AND FULGERATION;  Surgeon: Bjorn Pippin, MD;  Location: [facility];  Service: Urology;  Laterality: N/A;  . KNEE ARTHROSCOPY Right 2008  . LIVER SURGERY    . MULTIPLE EXTRACTIONS WITH ALVEOLOPLASTY N/A 03/24/2018   Procedure: EXTRACTIONS X 9;  Surgeon: Ocie Doyne, DDS;  Location: [facility];  Service: Oral Surgery;  Laterality: N/A;  . SMALL INTESTINE SURGERY    . WISDOM TOOTH EXTRACTION       OB History    Gravida  3   Para  1   Term  1   Preterm  0   AB  1   Living  1     SAB  0   TAB  0   Ectopic  1   Multiple  0   Live Births  1           Family History  Problem Relation Age of Onset  . Diabetes Father   . Endometriosis Mother   . Fibromyalgia Mother   . Heart disease Maternal Grandmother   . Hypertension Maternal Grandmother   . Osteoporosis  Maternal Grandmother   . Heart disease Maternal Grandfather   . Hypertension Maternal Grandfather   . Prostate cancer Maternal Grandfather   . Heart disease Paternal Grandmother   . Other Neg Hx   . Colon cancer Neg Hx   . Pancreatic cancer Neg Hx   . Stomach cancer Neg Hx     Social History   Tobacco Use  . Smoking status: Never Smoker  . Smokeless tobacco: Never Used  Substance Use Topics  . Alcohol use: No    Alcohol/week: 0.0 standard drinks  . Drug use: No    Home Medications Prior to Admission medications   Medication Sig Start Date End Date Taking? Authorizing Provider  AMBULATORY NON FORMULARY MEDICATION Iron Infusions every 3 months done at [facility]- ordered thru [facility]    [provider]  cetirizine (ZYRTEC) 5 MG tablet Take 5 mg by mouth daily.    [provider]  cevimeline (EVOXAC) 30 MG capsule Take 1 capsule by mouth 3 (three) times daily as needed. 04/28/19   [provider]  ciprofloxacin (CIPRO) 500 MG tablet Take 1 tablet (500 mg total) by mouth every 12 (twelve) hours. 07/06/19   Daley Mooradian, Mayer Masker, MD  cyclobenzaprine (FLEXERIL) 10 MG tablet Take 1 tablet (10 mg total) by mouth 2 (two) times daily as needed for muscle spasms. 08/10/17   Robinson, [place] N, PA-C  diazepam (DIASTAT ACUDIAL) 10 MG GEL Place 10 mg rectally at bedtime.    [provider]  diphenoxylate-atropine (LOMOTIL) 2.5-0.025 MG tablet One tablet twice a day 03/08/17   Meredith Pel, NP  eletriptan (RELPAX) 40 MG tablet Take 1 tablet (40 mg total) by mouth as needed for migraine or headache. May repeat in 2 hours if headache persists or recurs. 04/07/19   Levert Feinstein, MD  EPINEPHrine 0.3 mg/0.3 mL IJ SOAJ injection Inject 0.3 mg into the muscle as needed. 03/31/19   [provider]  hydroxychloroquine (PLAQUENIL) 200 MG tablet Take 400 mg by mouth every morning.     [provider]  hydroxypropyl methylcellulose / hypromellose  (ISOPTO TEARS / GONIOVISC) 2.5 % ophthalmic solution Place 2 drops into both eyes 3 (three) times daily as needed. 04/27/19   [provider]  levothyroxine (SYNTHROID) 100 MCG tablet Take 175 mcg by mouth daily.  02/27/17   [provider]  omalizumab Geoffry Paradise) 150 MG/ML prefilled syringe Inject 150 mg into the skin every 28 (twenty-eight) days.  [provider]  OMEPRAZOLE PO Take 20 mg by mouth at bedtime.     [provider]  ondansetron (ZOFRAN-ODT) 8 MG disintegrating tablet Take 1 tablet by mouth every 8 (eight) hours as needed. 04/27/19   [provider]  predniSONE (DELTASONE) 5 MG tablet Take 1 tablet by mouth daily. 04/27/19   [provider]  propranolol ER (INDERAL LA) 80 MG 24 hr capsule Take 1 capsule (80 mg total) by mouth at bedtime. 04/07/19   Levert FeinsteinYan, Yijun, MD  QUEtiapine (SEROQUEL) 300 MG tablet Take 300 mg by mouth at bedtime.    [provider]  tiZANidine (ZANAFLEX) 4 MG tablet Take 4 mg by mouth every 8 (eight) hours as needed for muscle spasms.  09/02/13   [provider]  White Petrolatum-Mineral Oil (WH PETROL-MINERAL OIL-LANOLIN) 0.1-0.1 % OINT Place 1 application into both eyes at bedtime. 04/28/19   [provider]    Allergies    Penicillins, Buprenorphine hcl, Morphine and related, and Hydrocodone-acetaminophen  Review of Systems   Review of Systems  Constitutional: Positive for fever.  Respiratory: Negative for shortness of breath.   Cardiovascular: Negative for chest pain.  Gastrointestinal: Positive for abdominal pain. Negative for diarrhea, nausea and vomiting.  Genitourinary: Positive for flank pain.       Foul-smelling urine  Musculoskeletal: Negative for back pain.  All other systems reviewed and are negative.   Physical Exam Updated Vital Signs BP 92/61   Pulse 70   Temp 98.6 F (37 C) (Oral)   Resp 20   Ht 1.524 m (5')   Wt 43.5 kg   LMP 06/26/2019   SpO2 100%    BMI 18.75 kg/m   Physical Exam Vitals and nursing note reviewed.  Constitutional:      Appearance: She is well-developed. She is not ill-appearing.  HENT:     Head: Normocephalic and atraumatic.     Mouth/Throat:     Mouth: Mucous membranes are moist.  Eyes:     Pupils: Pupils are equal, round, and reactive to light.  Cardiovascular:     Rate and Rhythm: Normal rate and regular rhythm.     Heart sounds: Normal heart sounds.  Pulmonary:     Effort: Pulmonary effort is normal. No respiratory distress.     Breath sounds: No wheezing.  Abdominal:     General: Bowel sounds are normal.     Palpations: Abdomen is soft.     Tenderness: There is no abdominal tenderness. There is no right CVA tenderness or left CVA tenderness.     Comments: Urostomy noted right lower quadrant, no tenderness to palpation on exam, no rebound or guarding, no CVA tenderness  Musculoskeletal:     Cervical back: Neck supple.  Skin:    General: Skin is warm and dry.     Comments: Vitiligo  Neurological:     Mental Status: She is alert and oriented to person, place, and time.  Psychiatric:        Mood and Affect: Mood normal.     ED Results / Procedures / Treatments   Labs (all labs ordered are listed, but only abnormal results are displayed) Labs Reviewed  COMPREHENSIVE METABOLIC PANEL - Abnormal; Notable for the following components:      Result Value   Sodium 134 (*)    Potassium 2.9 (*)    CO2 21 (*)    Glucose, Bld 120 (*)    AST 14 (*)    Total Bilirubin 2.4 (*)  All other components within normal limits  URINALYSIS, ROUTINE W REFLEX MICROSCOPIC - Abnormal; Notable for the following components:   APPearance CLOUDY (*)    Hgb urine dipstick LARGE (*)    Protein, ur 100 (*)    Nitrite POSITIVE (*)    Leukocytes,Ua LARGE (*)    All other components within normal limits  URINALYSIS, MICROSCOPIC (REFLEX) - Abnormal; Notable for the following components:   Bacteria, UA MANY (*)    All other  components within normal limits  URINE CULTURE  LIPASE, BLOOD  CBC  PREGNANCY, URINE  LACTIC ACID, PLASMA    EKG None  Radiology CT ABDOMEN PELVIS W CONTRAST  Result Date: 07/06/2019 CLINICAL DATA:  Abdominopelvic pain for 3 days. Fever. History of colostomy with reversal. Has urostomy. EXAM: CT ABDOMEN AND PELVIS WITH CONTRAST TECHNIQUE: Multidetector CT imaging of the abdomen and pelvis was performed using the standard protocol following bolus administration of intravenous contrast. CONTRAST:  OMNIPAQUE IOHEXOL 300 MG/ML  SOLN COMPARISON:  Abdominopelvic CT 03/19/2016. Right upper quadrant ultrasound 06/30/2019 FINDINGS: Lower chest: The lung bases are clear. Hepatobiliary: No focal liver abnormality is seen. Previous gallstone not well demonstrated on the current exam. No pericholecystic inflammation. No biliary dilatation. Pancreas: No ductal dilatation or inflammation. Spleen: Normal in size without focal abnormality. Adrenals/Urinary Tract: Normal adrenal glands. Homogeneous renal enhancement without hydronephrosis. Small cyst in the lower right kidney.no perinephric edema. No ureteral dilatation. Post cystectomy with right lower quadrant urostomy. Stomach/Bowel: Bowel evaluation is limited in the absence of enteric contrast and paucity of intra-abdominal fat. Nondistended stomach. No evidence of small bowel obstruction or inflammation. Enteric suture in the right lower quadrant. Liquid and formed stool in the ascending colon. Small volume formed stool in the descending colon. No colonic wall thickening or inflammation. Vascular/Lymphatic: Abdominal aorta is normal in caliber. Portal vein is patent. No bulky abdominopelvic adenopathy, paucity of intra-abdominal fat limits detailed assessment. Reproductive: Small cysts in both ovaries, likely physiologic. Uterus is unremarkable. Other: Trace pelvic free fluid. No upper abdominal ascites. No free air. No focal fluid collection.  Musculoskeletal: Presacral stimulator with battery pack on the left. There are no acute or suspicious osseous abnormalities. IMPRESSION: 1. Small bilateral ovarian cysts are likely physiologic, but nonspecific. Given reported pelvic pain, consider further evaluation with pelvic ultrasound. 2. No other acute finding. Previously visualized gallstones are not well seen. 3. Post cystectomy with right lower quadrant urostomy. Electronically Signed   By: Narda Rutherford M.D.   On: 07/06/2019 01:53    Procedures Procedures (including critical care time)  Medications Ordered in ED Medications  acetaminophen (TYLENOL) tablet 1,000 mg (1,000 mg Oral Given 07/05/19 2248)  sodium chloride 0.9 % bolus 1,000 mL (0 mLs Intravenous Stopped 07/06/19 0120)  ciprofloxacin (CIPRO) IVPB 400 mg (0 mg Intravenous Stopped 07/06/19 0043)  fentaNYL (SUBLIMAZE) injection 50 mcg (50 mcg Intravenous Given 07/05/19 2342)  diphenhydrAMINE (BENADRYL) injection 12.5 mg (12.5 mg Intravenous Given 07/05/19 2343)  iohexol (OMNIPAQUE) 300 MG/ML solution 100 mL (100 mLs Intravenous Contrast Given 07/06/19 0100)  potassium chloride SA (KLOR-CON) CR tablet 40 mEq (40 mEq Oral Given 07/06/19 0229)    ED Course  I have reviewed the triage vital signs and the nursing notes.  Pertinent labs & imaging results that were available during my care of the patient were reviewed by me and considered in my medical decision making (see chart for details).    MDM Rules/Calculators/A&P  Patient presents with abdominal pain.  Developed fever and urinary symptoms today.  She is overall nontoxic-appearing on exam.  However, noted to be febrile to 101.  Initially tachycardic to 125.  On my evaluation tachycardia has improved and she is clinically well-appearing.  She has had some intermittent abdominal pain but currently is pain-free.  Does not have any tenderness on exam.  She has a urostomy which puts her at risk for chronic colonization;  however, she reports changes in her urine character.  Urine does show nitrite positive with greater than 50 white cells and many bacteria.  We will culture.  Given fever and abdominal/flank symptoms, will treat for pyelonephritis.  Lactate is normal.  Patient was given fluids and her potassium was replaced as it was noted to be low at 2.9.  She was given Cipro given her allergy to cephalosporins.  She is not septic appearing.  I did obtain a CT scan of her abdomen given her complicated history to rule out perinephric abscess.  CT scan is largely reassuring.  She does have physiologic cyst bilaterally.  Given that she is nontender, have low suspicion for ovarian pathology or torsion.  On recheck, she continues to be comfortable.  She states she feels much better.  Will discharge with ciprofloxacin.  After history, exam, and medical workup I feel the patient has been appropriately medically screened and is safe for discharge home. Pertinent diagnoses were discussed with the patient. Patient was given return precautions.    Final Clinical Impression(s) / ED Diagnoses Final diagnoses:  Pyelonephritis    Rx / DC Orders ED Discharge Orders         Ordered    ciprofloxacin (CIPRO) 500 MG tablet  Every 12 hours     07/06/19 0300           Chanan Detwiler, Barbette Hair, MD 07/06/19 [phone]

## 2019-07-06 NOTE — Discharge Instructions (Addendum)
You were seen today for fever and abdominal pain.  Your urine does appear infected.  Your CT scan is reassuring.  You will be started on Cipro.  If you develop any new or worsening symptoms, persistent fevers, persistent pain, you should be reevaluated.

## 2019-07-06 NOTE — ED Notes (Signed)
Pt was given a sprite for her fluid challenge

## 2019-07-08 LAB — URINE CULTURE: Culture: >=100000 — AB

## 2019-07-09 ENCOUNTER — Ambulatory Visit: Payer: Medicaid Other | Admitting: Neurology

## 2019-07-09 ENCOUNTER — Telehealth: Payer: Self-pay | Admitting: *Deleted

## 2019-07-09 NOTE — Telephone Encounter (Signed)
Post ED Visit - Positive Culture Follow-up  Culture report reviewed by antimicrobial stewardship pharmacist: [facility] []  , Pharm.D. []  Enzo Bi, Pharm.D., BCPS AQ-ID []  , Pharm.D., BCPS []  Celedonio Miyamoto, Pharm.D., BCPS []  [place], Garvin Fila.D., BCPS, AAHIVP []  , Pharm.D., BCPS, AAHIVP []  Georgina Pillion, PharmD, BCPS []  , PharmD, BCPS []  [place], PharmD, BCPS []  [place], PharmD []  , PharmD, BCPS []  Estella Husk, PharmD  Pharmacy Team []  Lysle Pearl, PharmD []  , PharmD []  Phillips Climes, PharmD []  , Rph []  Agapito Games) , PharmD []  Verlan Friends, PharmD []  , PharmD []  Mervyn Gay, PharmD []  , PharmD []  Vinnie Level, PharmD []  Wonda Olds, PharmD []  , PharmD []  Len Childs, PharmD   Positive urine culture Treated with Ciprofloxacin HCL, organism sensitive to the same and no further patient follow-up is required at this time.  [facility] 07/09/2019, 10:33 AM

## 2019-07-14 ENCOUNTER — Ambulatory Visit: Payer: Medicaid Other | Admitting: Registered"

## 2019-07-23 ENCOUNTER — Encounter: Payer: Self-pay | Admitting: Registered"

## 2019-07-23 ENCOUNTER — Other Ambulatory Visit: Payer: Self-pay

## 2019-07-23 ENCOUNTER — Encounter: Payer: Medicaid Other | Attending: Family Medicine | Admitting: Registered"

## 2019-07-23 DIAGNOSIS — R634 Abnormal weight loss: Secondary | ICD-10-CM | POA: Diagnosis not present

## 2019-07-23 DIAGNOSIS — R636 Underweight: Secondary | ICD-10-CM

## 2019-07-23 NOTE — Progress Notes (Signed)
Medical Nutrition Therapy:  Appt start time: 11:50 end time: 12:30  Patient was seen on 07/23/2019 for nutrition counseling pertaining to disordered eating  Primary care provider: Rueben Bash, PA Therapist: Mathis Dad (sees weekly)  ROI: 10/14/2018 Any other medical team members: none   This note is not being shared with the patient for the following reason: To prevent harm (release of this note would result in harm to the life or physical safety of the patient or another).  Assessment:  Primary concerns today: Pt arrives stating she is still trying to gain weight. Recent labs (05/2018) show low Fe (33) and low hemoglobin (11.7).   Pt expectations: something she can do to help gain more weight  Pt states she is trying to increase calorie intake and fat intake. States she is trying to work on muscles. States she ate toast daily last week and it made her feel full quickly. States she has been eating more than usual due to taking antibiotics for the last 2 weeks. States family tells her to stop drinking cold water and to drink warm water to help with appetite; has been drinking hot tea in the morning to help. Reports trying to eat new things such as chicken salad; typically does not like "mushy" items.   Previous appts: Pt states she lives with parents and 66 year old daughter. Pt states she was taking MVI and supplements prior to last GI procedure and still vitamin deficient. Was deficient prior to procedures due to absorption and diarrhea. Due to large part of intestines being removed. Pt reports being deficient in B vitamins, fat-soluble vitamins, zinc, and selenium.   Reports taking Lomotil as prescribed before she eats. Takes 1-2 pills per meal depending on size of meal, per pharmacists instructions. Says she doesn't want to have anything with fat in it because she thinks fat is bad. States Lomotil is not working. Reports she had stopped drinking Ensure because it gave her diarrhea,  and thinks its because of sugar content. Pt states she uses fruit and fruit juice  in her smoothies which does not give her diarrhea.  Pt reports having feeding tube from infancy to [age] years old. Pt reports restricting food since 29 years old; no spicy, no dairy, no greasy foods because it would cause her to vomit.   Pt states she has had over half intestines removed plus more to help with bladder. Pt states bladder pain has improved but she still has some stomach issues.   Pt reports being able to tolerate well: soup, rice, baked chicken, seafood, not a big fan of meat, fruit. Pt states she also does not tolerate dairy or watermelon well.    Today's weight: 96.2 (+0.8 lbs) from 4 wks ago 95.4  (06/09/2019)  Medical Information:  Changes in hair, skin, nails since ED started: drier skin, some hair loss (believes it is due to medication changes) Chewing/swallowing difficulties: no Relux or heartburn: sometimes: yes, takes reflux medications  Trouble with teeth: no LMP without the use of hormones: 1/13  Weight at that point: 96 lbs Constipation, diarrhea: no, yes (pt states this is normal) Dizziness/lightheadedness: yes, has improved Headaches/body aches: yes, states its usual when menstrual cycle begins Heart racing/chest pain: sometimes Mood: stable Sleep: tired some days Focus/concentration: no concerns Cold intolerance: no Vision changes: no  Mental health diagnosis:   Dietary assessment: A typical day consists of 2-3 meals and 1 snack  Safe foods include: herbal tea, sweet tea, fruit smoothies, fruit, yogurt, rice,  shrimp Avoided foods include: red meat, fish, bread, and cashews  24 hour recall:  B (6:15 AM): 6 boiled eggs + yogurt + banana + hot tea Snk ( AM): 1/4 c raspberries L (12:30 PM): 1 c alfredo pasta + corn on cob + Ensure Plus  Snk (4 PM): 2 c grapes D (5 PM): 3 oz pork chop + 1/3 c okra + 1/3 c corn + 1/2 c peas  S (8 pm): strawberries + Ensure Plus    Beverages: water (2*16 oz), Ensure Plus, tea   Usual physical activity: dance exercises 60 min, biking, walking/running 3 mi, 2x/day  What Methods Do You Use To Control Your Weight (Compensatory behaviors)?           Restricting (calories, fat, carbs)  Food rules or rituals-eats to gain weight,  weighs about 6 times/day to see how weight  fluctuates   Estimated energy intake: 2300-2400 kcals  Estimated energy needs: 2000-2200 calories 225-248g carbohydrates 150-165 g protein 56-61 g fat  Nutritional Diagnosis:  NB-1.5 Disordered eating pattern As related to skipping meals.  As evidenced by dietary recall.    Intervention:  Nutrition education and counseling.  Encouraged pt with increased intake and eating meals/snacks throughout day. Pt is doing well!  Teaching Method Utilized:  Visual Auditory Hands on  Handouts given during visit include:  none  Barriers to learning/adherence to lifestyle change: none identified  Demonstrated degree of understanding via:  Teach Back   Monitoring/Evaluation:  Dietary intake, exercise, and body weight in 4 week(s).

## 2019-07-31 ENCOUNTER — Other Ambulatory Visit (HOSPITAL_COMMUNITY): Payer: Self-pay | Admitting: Surgery

## 2019-07-31 ENCOUNTER — Other Ambulatory Visit: Payer: Self-pay | Admitting: Surgery

## 2019-07-31 DIAGNOSIS — K8010 Calculus of gallbladder with chronic cholecystitis without obstruction: Secondary | ICD-10-CM

## 2019-08-07 ENCOUNTER — Encounter (HOSPITAL_COMMUNITY)
Admission: RE | Admit: 2019-08-07 | Discharge: 2019-08-07 | Disposition: A | Discharge status: Home / Self Care | Payer: Medicaid Other | Source: Ambulatory Visit | Attending: Surgery | Admitting: Surgery

## 2019-08-07 ENCOUNTER — Other Ambulatory Visit: Payer: Self-pay

## 2019-08-07 DIAGNOSIS — K8010 Calculus of gallbladder with chronic cholecystitis without obstruction: Secondary | ICD-10-CM | POA: Diagnosis not present

## 2019-08-07 MED ORDER — TECHNETIUM TC 99M MEBROFENIN IV KIT
5.3100 | PACK | Freq: Once | INTRAVENOUS | Status: AC | PRN
  Administered 2019-08-07: 07:00:00 5.31 via INTRAVENOUS

## 2019-08-20 ENCOUNTER — Ambulatory Visit: Payer: Medicaid Other | Admitting: Registered"

## 2019-09-01 ENCOUNTER — Other Ambulatory Visit: Payer: Self-pay

## 2019-09-01 ENCOUNTER — Encounter: Payer: Medicaid Other | Attending: Family Medicine | Admitting: Registered"

## 2019-09-01 ENCOUNTER — Encounter: Payer: Self-pay | Admitting: Registered"

## 2019-09-01 DIAGNOSIS — R636 Underweight: Secondary | ICD-10-CM

## 2019-09-01 DIAGNOSIS — R634 Abnormal weight loss: Secondary | ICD-10-CM | POA: Diagnosis present

## 2019-09-01 NOTE — Progress Notes (Signed)
Medical Nutrition Therapy:  Appt start time: 9:21 end time: 10:10  Patient was seen on 09/01/2019 for nutrition counseling pertaining to disordered eating  Primary care provider: Rueben Bash, PA Therapist: Mathis Dad (sees weekly)  ROI: 10/14/2018 Any other medical team members: none   This note is not being shared with the patient for the following reason: To prevent harm (release of this note would result in harm to the life or physical safety of the patient or another).  Assessment:  Primary concerns today: Pt arrives stating she is still trying to gain weight.   States she has started taking midi triglycerides, take 3 every full meal. Reports increased intake, improved dizziness, and improvements with chest pain/heart racing. Reports she only experienced dizziness once last week when she didn't eat. States she was out of town last week and ate a lot during that time while with friend; ate a lot of pasta and rice. States she notices the calmer she is, the more she wants to eat and able to pay more attention to hunger. States when she is stressed she is less likely to eat. Reports exercising 3-4x/week, 45 min/session. States she has decreased length and amount of sessions because it was causing her to stress her body resulting in hives.   Pt expectations: something she can do to help gain more weight  Previous appts: Pt states she lives with parents and 60 year old daughter. Pt states she was taking MVI and supplements prior to last GI procedure and still vitamin deficient. Was deficient prior to procedures due to absorption and diarrhea. Due to large part of intestines being removed. Pt reports being deficient in B vitamins, fat-soluble vitamins, zinc, and selenium. Reports taking Lomotil as prescribed before she eats. Takes 1-2 pills per meal depending on size of meal, per pharmacists instructions.   Pt reports having feeding tube from infancy to [age] years old. Pt reports restricting  food since 29 years old; no spicy, no dairy, no greasy foods because it would cause her to vomit.   Pt states she has had over half intestines removed plus more to help with bladder. Pt states bladder pain has improved but she still has some stomach issues.   Pt reports being able to tolerate well: soup, rice, baked chicken, seafood, not a big fan of meat, fruit. Pt states she also does not tolerate dairy or watermelon well.    Today's weight: 101.2, +5 lbs from 5 wks ago 96.2 (07/23/19)  Medical Information:  Changes in hair, skin, nails since ED started: drier skin, some hair loss (believes it is due to medication changes) Chewing/swallowing difficulties: no Relux or heartburn: sometimes: not more than usual, takes reflux medications  Trouble with teeth: no LMP without the use of hormones: 2/14  Weight at that point: 96 lbs Constipation, diarrhea: nothing unusual Dizziness/lightheadedness: no, only once last week Headaches/body aches: yes, states its usual when menstrual cycle begins Heart racing/chest pain: no Mood: better Sleep: about 3-5 hrs/night Focus/concentration: has trouble remembering things Cold intolerance: yes Vision changes: no  Mental health diagnosis:   Dietary assessment: A typical day consists of 3 meals and 2-3 snacks  Safe foods include: herbal tea, sweet tea, fruit smoothies, fruit, yogurt, rice, shrimp Avoided foods include: red meat, fish, bread, and cashews  24 hour recall:  B (6:15 AM): 3 boiled eggs + yogurt + 1/2 c grits or yogurt + strawberries Snk ( AM):  L (12:30 PM): 2 c shrimp + broccoli alfredo + Ensure  Plus Snk (4 PM): 2 c ceaser salad + shrimp + 1 c rice + shrimp D (5 PM): 3 oz beef + 1 c rice + gravy + 1 c collard greens   S (8 pm): strawberries + 2 popsicles + Ensure Plus   Beverages: water (2*16 oz), Ensure Plus, tea, peach soda (12 oz)  Usual physical activity: strength training 45 min, 3-4x/week   What Methods Do You Use To Control  Your Weight (Compensatory behaviors)?           Restricting (calories, fat, carbs)  Food rules or rituals-eats to gain weight, weighs about 6  times/day to see how weight  fluctuates   Estimated energy intake: ~2000 kcals  Estimated energy needs: 2000-2200 calories 225-248g carbohydrates 150-165 g protein 56-61 g fat  Nutritional Diagnosis:  NB-1.5 Disordered eating pattern As related to skipping meals.  As evidenced by dietary recall.    Intervention:  Nutrition education and counseling. Encouraged pt with increased intake and eating meals/snacks throughout day. Pt is doing well!  Teaching Method Utilized:  Visual Auditory Hands on  Handouts given during visit include:  none  Barriers to learning/adherence to lifestyle change: none identified  Demonstrated degree of understanding via:  Teach Back   Monitoring/Evaluation:  Dietary intake, exercise, and body weight in 4 week(s).

## 2019-10-06 ENCOUNTER — Encounter: Payer: Medicaid Other | Attending: Family Medicine | Admitting: Registered"

## 2019-10-06 ENCOUNTER — Other Ambulatory Visit: Payer: Self-pay

## 2019-10-06 ENCOUNTER — Encounter: Payer: Self-pay | Admitting: Registered"

## 2019-10-06 DIAGNOSIS — Z713 Dietary counseling and surveillance: Secondary | ICD-10-CM

## 2019-10-06 DIAGNOSIS — R634 Abnormal weight loss: Secondary | ICD-10-CM | POA: Insufficient documentation

## 2019-10-06 NOTE — Progress Notes (Signed)
Medical Nutrition Therapy:  Appt start time: 4:22 end time: 5:17  Patient was seen on 10/06/2019 for nutrition counseling pertaining to disordered eating  Primary care provider: Rueben Bash, PA Therapist: Mathis Dad (sees weekly)  ROI: 10/14/2018 Any other medical team members: none   This note is not being shared with the patient for the following reason: To prevent harm (release of this note would result in harm to the life or physical safety of the patient or another).  Assessment:  Primary concerns today: Pt arrives stating she is still trying to gain weight.   States when the weather gets warmer she wants to drink more and eat less. States daughter noticed she is having second helpings of food when eating meals. States she has been adding protein powder to Ensure Plus and feels like she needs to continue to do that to increase caloric intake. Pt states she has improved response with bowels; denies diarrhea and constipation.   Pt expectations: something she can do to help gain more weight  Previous appts: States she has started taking MCT, take 3 every full meal. Reports increased intake, improved dizziness, and improvements with chest pain/heart racing. Pt states she lives with parents and 76 year old daughter. Pt states she was taking MVI and supplements prior to last GI procedure and still vitamin deficient. Was deficient prior to procedures due to absorption and diarrhea. Due to large part of intestines being removed. Pt reports being deficient in B vitamins, fat-soluble vitamins, zinc, and selenium.   Pt reports having feeding tube from infancy to 45.29 years old. Pt reports restricting food since 29 years old; no spicy, no dairy, no greasy foods because it would cause her to vomit.   Pt states she has had over half intestines removed plus more to help with bladder. Pt states bladder pain has improved but she still has some stomach issues.   Pt reports being able to tolerate  well: soup, rice, baked chicken, seafood, not a big fan of meat, fruit. Pt states she also does not tolerate dairy or watermelon well.    Today's weight: 100.3, -1 lb from 4 wks ago 101.2 (09/01/19)  Medical Information:  Changes in hair, skin, nails since ED started: drier skin, some hair loss (believes it is due to medication changes) Chewing/swallowing difficulties: no Relux or heartburn: sometimes: not more than usual, takes reflux medications  Trouble with teeth: no LMP without the use of hormones: 3/14  Weight at that point: N/A Constipation, diarrhea: nothing unusual Dizziness/lightheadedness: no, only once last week Headaches/body aches: on and off Heart racing/chest pain: no Mood: better Sleep: about 5 hrs/night Focus/concentration: has trouble remembering things Cold intolerance: yes Vision changes: no  Mental health diagnosis:   Dietary assessment: A typical day consists of 3 meals and 2-3 snacks  Safe foods include: herbal tea, sweet tea, fruit smoothies, fruit, yogurt, rice, shrimp Avoided foods include: red meat, fish, bread, and cashews  24 hour recall:  B (6:15 AM): 6 eggs + Activia yogurt + 2 slices of toast + grape jelly  Snk ( AM): Ensure Plus  L (12:30 PM): grilled chicken + mashed potatoes + butter Snk (4 PM):  Ensure Plus D (5 PM): stir fry (chicken, bell peppers, onions, rice)  S (8 pm):   Beverages: water (2*16 oz), Ensure Plus, tea, peach soda (12 oz)  Usual physical activity: running 20 min, 2 days/week; , yoga, & strength training 45 min, 3-4x/week   What Methods Do You Use To  Control Your Weight (Compensatory behaviors)?           Restricting (calories, fat, carbs)  Food rules or rituals-eats to gain weight, weighs  about 6 times/day to see how weight  fluctuates   Estimated energy intake: 1800-1900 kcals  Estimated energy needs: 2000-2200 calories 225-248g carbohydrates 150-165 g protein 56-61 g fat  Nutritional Diagnosis:  NB-1.5  Disordered eating pattern As related to skipping meals.  As evidenced by dietary recall.    Intervention:  Nutrition education and counseling. Encouraged pt with increased intake and eating meals/snacks throughout day. Pt is doing well! Goals: - Keep up the great work! - Add in shake as evening snack. No extra protein needed.   Teaching Method Utilized:  Visual Auditory Hands on  Handouts given during visit include:  none  Barriers to learning/adherence to lifestyle change: none identified  Demonstrated degree of understanding via:  Teach Back   Monitoring/Evaluation:  Dietary intake, exercise, and body weight in 4 week(s).

## 2019-10-06 NOTE — Patient Instructions (Signed)
-   Keep up the great work!  - Add in shake as evening snack. No extra protein needed.

## 2019-11-09 ENCOUNTER — Other Ambulatory Visit: Payer: Self-pay

## 2019-11-09 ENCOUNTER — Encounter: Payer: Self-pay | Admitting: Neurology

## 2019-11-09 ENCOUNTER — Ambulatory Visit: Payer: Medicaid Other | Admitting: Neurology

## 2019-11-09 VITALS — BP 103/80 | HR 75 | Ht 60.0 in | Wt 99.0 lb

## 2019-11-09 DIAGNOSIS — R202 Paresthesia of skin: Secondary | ICD-10-CM | POA: Diagnosis not present

## 2019-11-09 DIAGNOSIS — G43709 Chronic migraine without aura, not intractable, without status migrainosus: Secondary | ICD-10-CM | POA: Diagnosis not present

## 2019-11-09 DIAGNOSIS — IMO0002 Reserved for concepts with insufficient information to code with codable children: Secondary | ICD-10-CM

## 2019-11-09 MED ORDER — AIMOVIG 70 MG/ML ~~LOC~~ SOAJ: 70.0000 mg | Freq: Every evening | SUBCUTANEOUS | 11 refills | Status: DC

## 2019-11-09 MED ORDER — SUMATRIPTAN SUCCINATE 6 MG/0.5ML ~~LOC~~ SOLN: 6.0000 mg | SUBCUTANEOUS | 5 refills | Status: DC | PRN

## 2019-11-09 MED ORDER — TIZANIDINE HCL 4 MG PO TABS: 4.0000 mg | ORAL_TABLET | Freq: Three times a day (TID) | ORAL | 3 refills | Status: DC | PRN

## 2019-11-09 MED ORDER — ELETRIPTAN HYDROBROMIDE 40 MG PO TABS: 40.0000 mg | ORAL_TABLET | ORAL | 6 refills | Status: DC | PRN

## 2019-11-09 MED ORDER — ONDANSETRON 8 MG PO TBDP: 8.0000 mg | ORAL_TABLET | Freq: Three times a day (TID) | ORAL | 6 refills | Status: DC | PRN

## 2019-11-09 NOTE — Patient Instructions (Signed)
Stop daily Tylenol use  Try Relpax for moderate to severe headache May mix with Alevel, Zofran for nause, tizanidine as muscle relaxant  For most severe headache, may use imitrex injections

## 2019-11-09 NOTE — Progress Notes (Signed)
PATIENT: [name] DOB: 1990/10/17  Chief Complaint  Patient presents with  . Follow-up    New room, alone. PCP: Rueben Bash.  . Numbness    Pt is complaining of worsening numbness in her left arm below the elbow.  . Headache    Pt complaining of worsening headaches. Pt wakes up with a HA.     HISTORICAL  [name] is a 29 year old female, seen in request by her primary care PA Rueben Bash for evaluation of chronic migraine, numbness, initial evaluation was on April 07, 2019.-  I have reviewed and summarized the referring note from the referring physician.  She had complicated past medical history, connective tissue disease, is taking Plaquenil 400 mg every morning, interstitial cystitis, hypothyroidism, on supplement, vitiligo  She reported a history of migraine headaches since age 30, her typical migraine started from occipital region, spreading forward, to become retro-orbital area severe pounding headache with associated light, noise sensitivity, nauseous, lasting few hours to 1 day, she now has migraine about 3 times each week, previously was treated with Topamax as preventive medication, only benefit her for a while and then stop, she was also treated with Botox in 2015, which was also helpful.  Previously she has tried Imitrex, Maxalt without significant improvement of her headache  She had long history of interstitial cystitis, eventually had ostomy,  Since 2013, following her C-section delivery of her child, began to notice numbness tingling at bilateral lower extremity, gradually getting worse, and also with bilateral hands involvement, she denies headache, has frequent diarrhea, she does complains of chronic neck pain.  Update Nov 09, 2019: Today her main concern is her frequent headaches, which started in middle school, she has the habit of taking over-the-counter medication on a daily basis over the years, previously was taking multiple  dose of ibuprofen, but was anemic, since 2017, she has been taking daily Tylenol, about 4 tablets each day, for her almost daily bilateral pressure headaches, once a week she would get more severe headache, severe pounding headache behind her eyes, with associated light noise sensitivity, Maxalt works well sometimes, often have to take second dose, her severe migraine last about 1 to 2 days  I personally reviewed CT head without contrast in 2013 that was normal  She is on polypharmacy treatment due to her mood disorder, this including Seroquel 300 mg every night, she continue complains of difficulty sleeping, I added on propanolol 80 mg daily for headache, which seems to help her some  She works as a Technical brewer, 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  ALLERGIES: Allergies  Allergen Reactions  . Penicillins Anaphylaxis  . Buprenorphine Hcl Itching  . Morphine And Related Itching  . Hydrocodone-Acetaminophen Rash    HOME MEDICATIONS: Current Outpatient Medications  Medication Sig Dispense Refill  . AMBULATORY NON FORMULARY MEDICATION Iron Infusions every 3 months done at [facility]- ordered thru [facility]    . cetirizine (ZYRTEC) 5 MG tablet Take 5 mg by mouth daily.    . cevimeline (EVOXAC) 30 MG capsule Take 1 capsule by mouth 3 (  three) times daily as needed.    . ciprofloxacin (CIPRO) 500 MG tablet Take 1 tablet (500 mg total) by mouth every 12 (twelve) hours. 14 tablet 0  . cyclobenzaprine (FLEXERIL) 10 MG tablet Take 1 tablet (10 mg total) by mouth 2 (two) times daily  as needed for muscle spasms. 14 tablet 0  . diazepam (DIASTAT ACUDIAL) 10 MG GEL Place 10 mg rectally at bedtime.    . diphenoxylate-atropine (LOMOTIL) 2.5-0.025 MG tablet One tablet twice a day 60 tablet 0  . eletriptan (RELPAX) 40 MG tablet Take 1 tablet (40 mg total) by mouth as needed for migraine or headache. May repeat in 2 hours if headache persists or recurs. 12 tablet 11  . EPINEPHrine 0.3 mg/0.3 mL IJ SOAJ injection Inject 0.3 mg into the muscle as needed.    . hydroxychloroquine (PLAQUENIL) 200 MG tablet Take 400 mg by mouth every morning.     . hydroxypropyl methylcellulose / hypromellose (ISOPTO TEARS / GONIOVISC) 2.5 % ophthalmic solution Place 2 drops into both eyes 3 (three) times daily as needed.    Marland Kitchen levothyroxine (SYNTHROID) 100 MCG tablet Take 175 mcg by mouth daily.     Marland Kitchen omalizumab Geoffry Paradise) 150 MG/ML prefilled syringe Inject 150 mg into the skin every 28 (twenty-eight) days.     . OMEPRAZOLE PO Take 20 mg by mouth at bedtime.     . ondansetron (ZOFRAN-ODT) 8 MG disintegrating tablet Take 1 tablet by mouth every 8 (eight) hours as needed.    . predniSONE (DELTASONE) 5 MG tablet Take 1 tablet by mouth daily.    . propranolol ER (INDERAL LA) 80 MG 24 hr capsule Take 1 capsule (80 mg total) by mouth at bedtime. 30 capsule 11  . QUEtiapine (SEROQUEL) 300 MG tablet Take 300 mg by mouth at bedtime.    Marland Kitchen tiZANidine (ZANAFLEX) 4 MG tablet Take 4 mg by mouth every 8 (eight) hours as needed for muscle spasms.     Marland Kitchen White Petrolatum-Mineral Oil (WH PETROL-MINERAL OIL-LANOLIN) 0.1-0.1 % OINT Place 1 application into both eyes at bedtime.     No current facility-administered medications for this visit.    PAST MEDICAL HISTORY: Past Medical History:  Diagnosis Date  . Anemia   . Anxiety   . Arthritis   . Bilateral hearing loss   . Bladder mass   . Chronic interstitial cystitis   . Chronic interstitial cystitis with hematuria    2016  . Chronic sinusitis   . Fibromyalgia   .  GERD (gastroesophageal reflux disease)   . Gross hematuria   . History of Graves' disease    tx done in 2007  . History of migraine   . History of recurrent UTIs   . Hypothyroidism   . Iron deficiency anemia    due to SGS  . Lower urinary tract symptoms (LUTS)   . Lupus [facility])    rheumologist--  dr Janalyn Rouse devashwer  . Malabsorption syndrome   . Migraine   . Migraine   . Passage of loose stools    chronic --  due to short gut syndrome  . Pleuritis   . S/P radioactive iodine thyroid ablation    2007  . SGS (short gut syndrome)   . Short gut syndrome   . Sjogren's disease (HCC)   . Vitiligo   . Wears glasses     PAST SURGICAL HISTORY: Past Surgical History:  Procedure Laterality Date  . BOWEL RESECTION  newborn   necrotizing enterocolitis (liver patched)  .  BRONCHOSCOPY  2014  . CESAREAN SECTION  01/02/2012   Procedure: CESAREAN SECTION;  Surgeon: Lovenia Kim, MD;  Location: [facility];  Service: Gynecology;  Laterality: N/A;  . COLONOSCOPY  07-29-2013  . CYSTECTOMY W/ URETEROILEAL CONDUIT  10/2017  . CYSTOSCOPY WITH BIOPSY N/A 11/23/2014   Procedure: CYSTOSCOPY WITH BLADDER  BIOPSY AND FULGERATION;  Surgeon: Irine Seal, MD;  Location: [facility];  Service: Urology;  Laterality: N/A;  . KNEE ARTHROSCOPY Right 2008  . LIVER SURGERY    . MULTIPLE EXTRACTIONS WITH ALVEOLOPLASTY N/A 03/24/2018   Procedure: EXTRACTIONS X 9;  Surgeon: Diona Browner, DDS;  Location: [facility];  Service: Oral Surgery;  Laterality: N/A;  . SMALL INTESTINE SURGERY    . WISDOM TOOTH EXTRACTION      FAMILY HISTORY: Family History  Problem Relation Age of Onset  . Diabetes Father   . Endometriosis Mother   . Fibromyalgia Mother   . Heart disease Maternal Grandmother   . Hypertension Maternal Grandmother   . Osteoporosis Maternal Grandmother   . Heart disease Maternal Grandfather   . Hypertension Maternal Grandfather   . Prostate cancer Maternal Grandfather   .  Heart disease Paternal Grandmother   . Other Neg Hx   . Colon cancer Neg Hx   . Pancreatic cancer Neg Hx   . Stomach cancer Neg Hx     SOCIAL HISTORY: Social History   Socioeconomic History  . Marital status: Divorced    Spouse name: Not on file  . Number of children: 1  . Years of education: college  . Highest education level: Not on file  Occupational History  . Occupation: N/A  Tobacco Use  . Smoking status: Never Smoker  . Smokeless tobacco: Never Used  Substance and Sexual Activity  . Alcohol use: No    Alcohol/week: 0.0 standard drinks  . Drug use: No  . Sexual activity: Not on file  Other Topics Concern  . Not on file  Social History Narrative   Lives at home with family.   Right-handed.   6 cups tea throughout the week.   Social Determinants of Health   Financial Resource Strain:   . Difficulty of Paying Living Expenses:   Food Insecurity:   . Worried About Charity fundraiser in the Last Year:   . Arboriculturist in the Last Year:   Transportation Needs:   . Film/video editor (Medical):   Marland Kitchen Lack of Transportation (Non-Medical):   Physical Activity:   . Days of Exercise per Week:   . Minutes of Exercise per Session:   Stress:   . Feeling of Stress :   Social Connections:   . Frequency of Communication with Friends and Family:   . Frequency of Social Gatherings with Friends and Family:   . Attends Religious Services:   . Active Member of Clubs or Organizations:   . Attends Archivist Meetings:   Marland Kitchen Marital Status:   Intimate Partner Violence:   . Fear of Current or Ex-Partner:   . Emotionally Abused:   Marland Kitchen Physically Abused:   . Sexually Abused:      PHYSICAL EXAM   Vitals:   11/09/19 1603  BP: 103/80  Pulse: 75  Weight: 99 lb (44.9 kg)  Height: 5' (1.524 m)    Not recorded      Body mass index is 19.33 kg/m.  PHYSICAL EXAMNIATION:  Gen: NAD, conversant, well nourised, well groomed  NEUROLOGICAL  EXAM:  MENTAL STATUS: Speech:    Speech is normal; fluent and spontaneous with normal comprehension.  Cognition:     Orientation to time, place and person     Normal recent and remote memory     Normal Attention span and concentration     Normal Language, naming, repeating,spontaneous speech     Fund of knowledge   CRANIAL NERVES: CN II: Visual fields are full to confrontation.  Pupils are round equal and briskly reactive to light. CN III, IV, VI: extraocular movement are normal. No ptosis. CN V: Facial sensation is intact to pinprick in all 3 divisions bilaterally. Corneal responses are intact.  CN VII: Face is symmetric with normal eye closure and smile. CN VIII: Hearing is normal to causal conversation. CN IX, X:Phonation is normal. CN XI: Head turning and shoulder shrug are intact   MOTOR:  Muscle bulk and tone are normal. Muscle strength is normal.  Left elbow sensitivity upon deep palpitation  REFLEXES: Reflexes are 2+ and symmetric at the biceps, triceps, knees, and ankles. Plantar responses are flexor.  SENSORY: Intact to light touch, pinprick, positional sensation and vibratory sensation are intact in fingers and toes.  COORDINATION: Rapid alternating movements and fine finger movements are intact. There is no dysmetria on finger-to-nose and heel-knee-shin.    GAIT/STANCE: Posture is normal. Gait is steady with normal steps, base, arm swing, and turning. Heel and toe walking are normal. Tandem gait is normal.  Romberg is absent.   DIAGNOSTIC DATA (LABS, IMAGING, TESTING) - I reviewed patient records, labs, notes, testing and imaging myself where available.   ASSESSMENT AND PLAN  [name] is a 29 y.o. 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 Ph.D.  [facility] [address] [place], [place] [address] Ph: [phone] Fax: [phone]  CC: [name], [place]

## 2019-11-12 ENCOUNTER — Ambulatory Visit: Payer: Medicaid Other | Admitting: Registered"

## 2019-12-08 ENCOUNTER — Ambulatory Visit: Payer: Medicaid Other | Admitting: Registered"

## 2020-02-09 ENCOUNTER — Ambulatory Visit: Payer: Medicaid Other | Admitting: Neurology

## 2020-02-09 ENCOUNTER — Telehealth: Payer: Self-pay | Admitting: Neurology

## 2020-02-09 ENCOUNTER — Encounter: Payer: Self-pay | Admitting: Neurology

## 2020-02-09 VITALS — BP 114/75 | HR 77 | Ht 60.0 in | Wt 98.0 lb

## 2020-02-09 DIAGNOSIS — R202 Paresthesia of skin: Secondary | ICD-10-CM

## 2020-02-09 DIAGNOSIS — G43709 Chronic migraine without aura, not intractable, without status migrainosus: Secondary | ICD-10-CM | POA: Diagnosis not present

## 2020-02-09 DIAGNOSIS — IMO0002 Reserved for concepts with insufficient information to code with codable children: Secondary | ICD-10-CM

## 2020-02-09 MED ORDER — SUMATRIPTAN SUCCINATE 6 MG/0.5ML ~~LOC~~ SOLN: 6.0000 mg | SUBCUTANEOUS | 5 refills | Status: DC | PRN

## 2020-02-09 MED ORDER — PROPRANOLOL HCL ER 80 MG PO CP24: 80.0000 mg | ORAL_CAPSULE | Freq: Every day | ORAL | 11 refills | Status: DC

## 2020-02-09 NOTE — Progress Notes (Signed)
PATIENT: Jamie ChadKateria B Kandler DOB: [birth year]  REASON FOR VISIT: follow up HISTORY FROM: patient  HISTORY OF PRESENT ILLNESS: Today 02/09/20  HISTORY  [name] is a 29 year old female, seen in request by her primary care PA Rueben BashWilliams, [place] for evaluation of chronic migraine, numbness, initial evaluation was on April 07, 2019.-  I have reviewed and summarized the referring note from the referring physician.  She had complicated past medical history, connective tissue disease, is taking Plaquenil 400 mg every morning, interstitial cystitis, hypothyroidism, on supplement, vitiligo  She reported a history of migraine headaches since age 29, her typical migraine started from occipital region, spreading forward, to become retro-orbital area severe pounding headache with associated light, noise sensitivity, nauseous, lasting few hours to 1 day, she now has migraine about 3 times each week, previously was treated with Topamax as preventive medication, only benefit her for a while and then stop, she was also treated with Botox in 2015, which was also helpful.  Previously she has tried Imitrex, Maxalt without significant improvement of her headache  She had long history of interstitial cystitis, eventually had ostomy,  Since 2013, following her C-section delivery of her child, began to notice numbness tingling at bilateral lower extremity, gradually getting worse, and also with bilateral hands involvement, she denies headache, has frequent diarrhea, she does complains of chronic neck pain.  Update Nov 09, 2019: Today her main concern is her frequent headaches, which started in middle school, she has the habit of taking over-the-counter medication on a daily basis over the years, previously was taking multiple dose of ibuprofen, but was anemic, since 2017, she has been taking daily Tylenol, about 4 tablets each day, for her almost daily bilateral pressure headaches, once a week she  would get more severe headache, severe pounding headache behind her eyes, with associated light noise sensitivity, Maxalt works well sometimes, often have to take second dose, her severe migraine last about 1 to 2 days  I personally reviewed CT head without contrast in 2013 that was normal  She is on polypharmacy treatment due to her mood disorder, this including Seroquel 300 mg every night, she continue complains of difficulty sleeping, I added on propanolol 80 mg daily for headache, which seems to help her some  She works as a Technical brewermasseuse, 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 Has been on Aimovig 70 mg since May, headaches are 25% better. Also, takes propranolol LA 80 mg daily. Continues to reports pain to left elbow, numbneess into left hand, feels tingling, can feel ache in her arm, from elbow down to fingers is ache, like running hands under hot water, then under cold, has spinal stimulator for sacral spine, can't have MRI, helps with pelvic pain, had her bladder removed. Still has tingling to right hand, but not arm, legs will still go numb at times.  Overall health is stable.  Is taking a break from working as a Technical brewermasseuse.  Never able to get Imitrex injection from pharmacy, has Relpax.  REVIEW OF SYSTEMS: Out of a complete 14 system  review of symptoms, the patient complains only of the following symptoms, and all other reviewed systems are negative.  Headache, numbness  ALLERGIES: Allergies  Allergen Reactions  . Penicillins  Anaphylaxis  . Buprenorphine Hcl Itching  . Morphine And Related Itching  . Hydrocodone-Acetaminophen Rash    HOME MEDICATIONS: Outpatient Medications Prior to Visit  Medication Sig Dispense Refill  . AMBULATORY NON FORMULARY MEDICATION Iron Infusions every 3 months done at [facility]- ordered thru [facility]    . cetirizine (ZYRTEC) 5 MG tablet Take 5 mg by mouth daily.    . cevimeline (EVOXAC) 30 MG capsule Take 1 capsule by mouth 3 (three) times daily as needed.    . ciprofloxacin (CIPRO) 500 MG tablet Take 1 tablet (500 mg total) by mouth every 12 (twelve) hours. 14 tablet 0  . cyclobenzaprine (FLEXERIL) 10 MG tablet Take 1 tablet (10 mg total) by mouth 2 (two) times daily as needed for muscle spasms. 14 tablet 0  . diazepam (DIASTAT ACUDIAL) 10 MG GEL Place 10 mg rectally at bedtime.    . diphenoxylate-atropine (LOMOTIL) 2.5-0.025 MG tablet One tablet twice a day 60 tablet 0  . eletriptan (RELPAX) 40 MG tablet Take 1 tablet (40 mg total) by mouth as needed for migraine or headache. May repeat in 2 hours if headache persists or recurs. 12 tablet 11  . eletriptan (RELPAX) 40 MG tablet Take 1 tablet (40 mg total) by mouth as needed for migraine or headache. May repeat in 2 hours if headache persists or recurs. 12 tablet 6  . EPINEPHrine 0.3 mg/0.3 mL IJ SOAJ injection Inject 0.3 mg into the muscle as needed.    Dorise Hiss (AIMOVIG) 70 MG/ML SOAJ Inject 70 mg into the skin at bedtime. 1 pen 11  . hydroxychloroquine (PLAQUENIL) 200 MG tablet Take 400 mg by mouth every morning.     . hydroxypropyl methylcellulose / hypromellose (ISOPTO TEARS / GONIOVISC) 2.5 % ophthalmic solution Place 2 drops into both eyes 3 (three) times daily as needed.    Marland Kitchen levothyroxine (SYNTHROID) 100 MCG tablet Take 175 mcg by mouth daily.     Marland Kitchen omalizumab Geoffry Paradise) 150 MG/ML prefilled syringe Inject 150 mg into the skin every 28 (twenty-eight) days.     . OMEPRAZOLE PO Take 20 mg by mouth at bedtime.      . ondansetron (ZOFRAN-ODT) 8 MG disintegrating tablet Take 1 tablet (8 mg total) by mouth every 8 (eight) hours as needed. 20 tablet 6  . predniSONE (DELTASONE) 5 MG tablet Take 1 tablet by mouth daily.    . QUEtiapine (SEROQUEL) 300 MG tablet Take 300 mg by mouth at bedtime.    Marland Kitchen tiZANidine (ZANAFLEX) 4 MG tablet Take 1 tablet (4 mg total) by mouth every 8 (eight) hours as needed for muscle spasms. 30 tablet 3  . White Petrolatum-Mineral Oil (WH PETROL-MINERAL OIL-LANOLIN) 0.1-0.1 % OINT Place 1 application into both eyes at bedtime.    . propranolol ER (INDERAL LA) 80 MG 24 hr capsule Take 1 capsule (80 mg total) by mouth at bedtime. 30 capsule 11  . SUMAtriptan (IMITREX) 6 MG/0.5ML SOLN injection Inject 0.5 mLs (6 mg total) into the skin every 2 (two) hours as needed for migraine or headache. May repeat in 2 hours if headache persists or recurs. 6 mL 5   No facility-administered medications prior to visit.    PAST MEDICAL HISTORY: Past Medical History:  Diagnosis Date  . Anemia   . Anxiety   . Arthritis   .  Bilateral hearing loss   . Bladder mass   . Chronic interstitial cystitis   . Chronic interstitial cystitis with hematuria    2016  . Chronic sinusitis   . Fibromyalgia   . GERD (gastroesophageal reflux disease)   . Gross hematuria   . History of Graves' disease    tx done in 2007  . History of migraine   . History of recurrent UTIs   . Hypothyroidism   . Iron deficiency anemia    due to SGS  . Lower urinary tract symptoms (LUTS)   . Lupus [facility])    rheumologist--  dr Janalyn Rouse devashwer  . Malabsorption syndrome   . Migraine   . Migraine   . Passage of loose stools    chronic --  due to short gut syndrome  . Pleuritis   . S/P radioactive iodine thyroid ablation    2007  . SGS (short gut syndrome)   . Short gut syndrome   . Sjogren's disease (HCC)   . Vitiligo   . Wears glasses     PAST SURGICAL HISTORY: Past Surgical History:  Procedure Laterality Date  .  BOWEL RESECTION  newborn   necrotizing enterocolitis (liver patched)  . BRONCHOSCOPY  2014  . CESAREAN SECTION  01/02/2012   Procedure: CESAREAN SECTION;  Surgeon: Lenoard Aden, MD;  Location: [facility];  Service: Gynecology;  Laterality: N/A;  . COLONOSCOPY  07-29-2013  . CYSTECTOMY W/ URETEROILEAL CONDUIT  10/2017  . CYSTOSCOPY WITH BIOPSY N/A 11/23/2014   Procedure: CYSTOSCOPY WITH BLADDER  BIOPSY AND FULGERATION;  Surgeon: Bjorn Pippin, MD;  Location: [facility];  Service: Urology;  Laterality: N/A;  . KNEE ARTHROSCOPY Right 2008  . LIVER SURGERY    . MULTIPLE EXTRACTIONS WITH ALVEOLOPLASTY N/A 03/24/2018   Procedure: EXTRACTIONS X 9;  Surgeon: Ocie Doyne, DDS;  Location: [facility];  Service: Oral Surgery;  Laterality: N/A;  . SMALL INTESTINE SURGERY    . WISDOM TOOTH EXTRACTION      FAMILY HISTORY: Family History  Problem Relation Age of Onset  . Diabetes Father   . Endometriosis Mother   . Fibromyalgia Mother   . Heart disease Maternal Grandmother   . Hypertension Maternal Grandmother   . Osteoporosis Maternal Grandmother   . Heart disease Maternal Grandfather   . Hypertension Maternal Grandfather   . Prostate cancer Maternal Grandfather   . Heart disease Paternal Grandmother   . Other Neg Hx   . Colon cancer Neg Hx   . Pancreatic cancer Neg Hx   . Stomach cancer Neg Hx     SOCIAL HISTORY: Social History   Socioeconomic History  . Marital status: Divorced    Spouse name: Not on file  . Number of children: 1  . Years of education: college  . Highest education level: Not on file  Occupational History  . Occupation: N/A  Tobacco Use  . Smoking status: Never Smoker  . Smokeless tobacco: Never Used  Substance and Sexual Activity  . Alcohol use: No    Alcohol/week: 0.0 standard drinks  . Drug use: No  . Sexual activity: Not on file  Other Topics Concern  . Not on file  Social History Narrative   Lives at home with family.    Right-handed.   6 cups tea throughout the week.   Social Determinants of Health   Financial Resource Strain:   . Difficulty of Paying Living Expenses:   Food Insecurity:   . Worried About  Running Out of Food in the Last Year:   . Ran Out of Food in the Last Year:   Transportation Needs:   . Lack of Transportation (Medical):   Marland Kitchen Lack of Transportation (Non-Medical):   Physical Activity:   . Days of Exercise per Week:   . Minutes of Exercise per Session:   Stress:   . Feeling of Stress :   Social Connections:   . Frequency of Communication with Friends and Family:   . Frequency of Social Gatherings with Friends and Family:   . Attends Religious Services:   . Active Member of Clubs or Organizations:   . Attends Banker Meetings:   Marland Kitchen Marital Status:   Intimate Partner Violence:   . Fear of Current or Ex-Partner:   . Emotionally Abused:   Marland Kitchen Physically Abused:   . Sexually Abused:    PHYSICAL EXAM  Vitals:   02/09/20 0833  BP: 114/75  Pulse: 77  Weight: 98 lb (44.5 kg)  Height: 5' (1.524 m)   Body mass index is 19.14 kg/m.  Generalized: Well developed, in no acute distress, skin color asymmetry diffusely Neurological examination  Mentation: Alert oriented to time, place, history taking. Follows all commands speech and language fluent Cranial nerve II-XII: Pupils were equal round reactive to light. Extraocular movements were full, visual field were full on confrontational test. Facial sensation and strength were normal. Head turning and shoulder shrug were normal and symmetric. Motor: The motor testing reveals 5 over 5 strength of all 4 extremities. Good symmetric motor tone is noted throughout.  Sensory: Sensory testing is intact to soft touch on all 4 extremities. No evidence of extinction is noted.  Coordination: Cerebellar testing reveals good finger-nose-finger and heel-to-shin bilaterally.  Gait and station: Gait is normal. Tandem gait is normal. Romberg  is negative. No drift is seen.  Reflexes: Deep tendon reflexes are symmetric and normal bilaterally.   DIAGNOSTIC DATA (LABS, IMAGING, TESTING) - I reviewed patient records, labs, notes, testing and imaging myself where available.  Lab Results  Component Value Date   WBC 8.7 07/05/2019   HGB 12.0 07/05/2019   HCT 36.9 07/05/2019   MCV 90.7 07/05/2019   PLT 200 07/05/2019      Component Value Date/Time   NA 134 (L) 07/05/2019 1909   K 2.9 (L) 07/05/2019 1909   CL 104 07/05/2019 1909   CO2 21 (L) 07/05/2019 1909   GLUCOSE 120 (H) 07/05/2019 1909   BUN 7 07/05/2019 1909   CREATININE 0.68 07/05/2019 1909   CALCIUM 8.9 07/05/2019 1909   PROT 7.2 07/05/2019 1909   ALBUMIN 3.6 07/05/2019 1909   AST 14 (L) 07/05/2019 1909   ALT 11 07/05/2019 1909   ALKPHOS 73 07/05/2019 1909   BILITOT 2.4 (H) 07/05/2019 1909   GFRNONAA >60 07/05/2019 1909   GFRAA >60 07/05/2019 1909   No results found for: CHOL, HDL, LDLCALC, LDLDIRECT, TRIG, CHOLHDL No results found for: ZOXW9U Lab Results  Component Value Date   VITAMINB12 389 07/24/2017   Lab Results  Component Value Date   TSH 0.076 (L) 07/22/2013      ASSESSMENT AND PLAN 29 y.o. year old female  has a past medical history of Anemia, Anxiety, Arthritis, Bilateral hearing loss, Bladder mass, Chronic interstitial cystitis, Chronic interstitial cystitis with hematuria, Chronic sinusitis, Fibromyalgia, GERD (gastroesophageal reflux disease), Gross hematuria, History of Graves' disease, History of migraine, History of recurrent UTIs, Hypothyroidism, Iron deficiency anemia, Lower urinary tract symptoms (LUTS), Lupus (HCC), Malabsorption  syndrome, Migraine, Migraine, Passage of loose stools, Pleuritis, S/P radioactive iodine thyroid ablation, SGS (short gut syndrome), Short gut syndrome, Sjogren's disease (HCC), Vitiligo, and Wears glasses. here with:  1. Chronic migraine headaches -Improved, still slight daily headache, but severe headache  only every 2 weeks  -Continue Aimovig 70 mg monthly injection for migraine prevention, tolerating well, talked about higher dose, decided to stay with current dosage -Continue Inderal ER 80 mg daily for migraine prevention -Continue Relpax 40 mg as needed for moderate headache -Continue Imitrex injection for severe headache, may mix with Aleve, Zofran, tizanidine (resent this to pharmacy) -Previously tried and failed Topamax, is on Seroquel for mood disorder   2. 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  This included previsit chart review, lab review, study review, order entry, electronic health record documentation, patient education.  Margie Ege, AGNP-C, DNP 02/09/2020, 10:15 AM [facility] [address] [place], [place] [address] [phone]

## 2020-02-09 NOTE — Patient Instructions (Signed)
I will order CT cervical spine  Continue current medications Add on Imitrex injection for severe headaches See you back in 6 months

## 2020-02-09 NOTE — Telephone Encounter (Signed)
Medicaid order sent to GI. FFS order sent to GI. They will reach out to the patient to schedule.

## 2020-02-12 ENCOUNTER — Ambulatory Visit
Admission: RE | Admit: 2020-02-12 | Discharge: 2020-02-12 | Disposition: A | Discharge status: Home / Self Care | Payer: Medicaid Other | Source: Ambulatory Visit | Attending: Neurology | Admitting: Neurology

## 2020-02-12 ENCOUNTER — Other Ambulatory Visit: Payer: Self-pay

## 2020-02-12 DIAGNOSIS — R202 Paresthesia of skin: Secondary | ICD-10-CM

## 2020-02-15 ENCOUNTER — Telehealth: Payer: Self-pay | Admitting: Neurology

## 2020-02-15 NOTE — Telephone Encounter (Signed)
I called the patient.  The CT of the cervical spine was relatively unremarkable, no significant arthritis to explain the neck pain.  I discussed this with the patient.   CT cervical 02/13/20:  IMPRESSION: No significant degenerative disc or joint disease in the cervical spine.

## 2020-02-15 NOTE — Telephone Encounter (Signed)
-----   Message from Lilla Shook, RN sent at 02/15/2020 12:45 PM EDT -----  ----- Message ----- From: Interface, Rad Results In Sent: 02/13/2020   5:54 PM EDT To: Levert Feinstein, MD

## 2020-02-16 ENCOUNTER — Telehealth: Payer: Self-pay | Admitting: *Deleted

## 2020-02-16 NOTE — Telephone Encounter (Signed)
mychart message sent to pt with CT results.

## 2020-03-08 ENCOUNTER — Ambulatory Visit: Payer: Medicaid Other | Admitting: Registered"

## 2020-03-16 ENCOUNTER — Encounter: Payer: Self-pay | Admitting: Registered"

## 2020-03-16 ENCOUNTER — Encounter: Payer: Medicaid Other | Attending: Physician Assistant | Admitting: Registered"

## 2020-03-16 ENCOUNTER — Other Ambulatory Visit: Payer: Self-pay

## 2020-03-16 DIAGNOSIS — Z713 Dietary counseling and surveillance: Secondary | ICD-10-CM | POA: Diagnosis present

## 2020-03-16 NOTE — Progress Notes (Signed)
Medical Nutrition Therapy:  Appt start time: 3:07 end time: 3:30  Patient was seen on 03/16/2020 for nutrition counseling pertaining to disordered eating  Primary care provider: Rueben Bash, PA Therapist: Mathis Dad (sees weekly)  ROI: 10/14/2018 Any other medical team members: none    Assessment:  Primary concerns today: Pt arrives stating she is tired today. States she has arthritis in her neck and taking tizanidine for it. States medication is making her tired. States she started taking tizanidine about a week ago. Pt is falling asleep during appointment between questions. Still drinking Ensure and taking capsules when eating. States recent home weight was 102 lbs. States it bothers her when people compare her weight to her daughter's weight.   RD recommended pt wait in the lobby for a few minutes until she was more alert. Also provided pt with juice to help increase energy levels.  Pt expectations: something she can do to help gain more weight  Previous appts: States she has started taking MCT, take 3 every full meal. Reports increased intake, improved dizziness, and improvements with chest pain/heart racing. Pt states she lives with parents and 75 year old daughter. Pt states she was taking MVI and supplements prior to last GI procedure and still vitamin deficient. Was deficient prior to procedures due to absorption and diarrhea. Due to large part of intestines being removed. Pt reports being deficient in B vitamins, fat-soluble vitamins, zinc, and selenium.   Pt reports having feeding tube from infancy to [age] years old. Pt reports restricting food since 29 years old; no spicy, no dairy, no greasy foods because it would cause her to vomit.   Pt states she has had over half intestines removed plus more to help with bladder. Pt states bladder pain has improved but she still has some stomach issues.   Pt reports being able to tolerate well: soup, rice, baked chicken, seafood, not a  big fan of meat, fruit. Pt states she also does not tolerate dairy or watermelon well.   Recent weight: 102 (pt reported from home), +1.7 lbs from 100.3 5 months ago (10/06/2019); our office scale is broken today  Medical Information:  Changes in hair, skin, nails since ED started: drier skin, some hair loss (believes it is due to medication changes) Chewing/swallowing difficulties: no Relux or heartburn: sometimes: not more than usual, takes reflux medications  Trouble with teeth: no LMP without the use of hormones: 3/14  Weight at that point: N/A Constipation, diarrhea: nothing unusual Dizziness/lightheadedness: no, only once last week Headaches/body aches: on and off Heart racing/chest pain: no Mood: better Sleep: about 5 hrs/night Focus/concentration: has trouble remembering things Cold intolerance: yes Vision changes: no  Mental health diagnosis:   Dietary assessment: A typical day consists of 3 meals and 2-3 snacks  Safe foods include: herbal tea, sweet tea, fruit smoothies, fruit, yogurt, rice, shrimp Avoided foods include: red meat, fish, bread, and cashews  24 hour recall: poor historian due to sleepiness B (6:15 AM): banana + juice Snk ( AM):   L (12:30 PM):  Snk (4 PM): 3-4 shrimp + hush puppies + Marianne Sofia D (5 PM):  S (8 pm):   Beverages: water (2*16 oz), Ensure Plus, tea, peach soda (12 oz)  Usual physical activity: walking 30 min,   What Methods Do You Use To Control Your Weight (Compensatory behaviors)?           Restricting (calories, fat, carbs)  Food rules or rituals-eats to gain weight, weighs  about  6 times/day to see how weight  fluctuates   Estimated energy intake:   Estimated energy needs: 2000-2200 calories 225-248g carbohydrates 150-165 g protein 56-61 g fat  Nutritional Diagnosis:  NB-1.5 Disordered eating pattern As related to skipping meals.  As evidenced by dietary recall.    Intervention:  Nutrition education and counseling.  Encouraged pt with increased intake and eating meals/snacks throughout day.  RD recommended pt wait in the lobby for a few minutes until she was more alert. Also provided pt with juice to help increase energy levels.    Teaching Method Utilized:  Visual Auditory Hands on  Handouts given during visit include:  none  Barriers to learning/adherence to lifestyle change: none identified  Demonstrated degree of understanding via:  Teach Back   Monitoring/Evaluation:  Dietary intake, exercise, and body weight in 4 week(s).

## 2020-04-04 ENCOUNTER — Other Ambulatory Visit: Payer: Medicaid Other

## 2020-04-04 DIAGNOSIS — Z20822 Contact with and (suspected) exposure to covid-19: Secondary | ICD-10-CM

## 2020-04-05 ENCOUNTER — Other Ambulatory Visit: Payer: Self-pay

## 2020-04-05 ENCOUNTER — Encounter: Payer: Self-pay | Admitting: Registered"

## 2020-04-05 ENCOUNTER — Encounter: Payer: Medicaid Other | Attending: Physician Assistant | Admitting: Registered"

## 2020-04-05 DIAGNOSIS — Z713 Dietary counseling and surveillance: Secondary | ICD-10-CM | POA: Insufficient documentation

## 2020-04-05 LAB — SARS-COV-2, NAA 2 DAY TAT

## 2020-04-05 LAB — NOVEL CORONAVIRUS, NAA: SARS-CoV-2, NAA: NOT DETECTED

## 2020-04-05 NOTE — Progress Notes (Signed)
Medical Nutrition Therapy:  Appt start time: 2:04 end time: 2:55  Patient was seen on 04/05/2020 for nutrition counseling pertaining to disordered eating  Primary care provider: Rueben Bash, PA Therapist: Mathis Dad (sees weekly)  ROI: 10/14/2018 Any other medical team members: none    Assessment:    Pt arrives with daughter. Pt states she feels a mixture between great and good. Pt wants to know if she can drink all of her nourishment. States she is tired of eating food. Feels taxing.  States she started taking birth control in April to help with monthly cramping and bleeding. States since then she has been having swelling, hives, skin breakouts, and lack of appetite. States she has also been much busier and active lately because she does not like being stagnant.   States she had to eat dinner last night because she ran out of Ensure Plus. Reports she would rather drink something instead of eating.   Pt expectations: something she can do to help gain more weight  Previous appts: States she has started taking MCT, take 3 every full meal. Reports increased intake, improved dizziness, and improvements with chest pain/heart racing. Pt states she lives with parents and 4 year old daughter. Pt states she was taking MVI and supplements prior to last GI procedure and still vitamin deficient. Was deficient prior to procedures due to absorption and diarrhea. Due to large part of intestines being removed. Pt reports being deficient in B vitamins, fat-soluble vitamins, zinc, and selenium.   Pt reports having feeding tube from infancy to [age] years old. Pt reports restricting food since 29 years old; no spicy, no dairy, no greasy foods because it would cause her to vomit.   Pt states she has had over half intestines removed plus more to help with bladder. Pt states bladder pain has improved but she still has some stomach issues.   Pt reports being able to tolerate well: soup, rice, baked chicken,  seafood, not a big fan of meat, fruit. Pt states she also does not tolerate dairy or watermelon well.   Recent weight: 102 (pt reported from home), +1.7 lbs from 100.3 5 months ago (10/06/2019); our office scale is broken today  Medical Information:  Changes in hair, skin, nails since ED started: drier skin, some hair loss (believes it is due to medication changes) Chewing/swallowing difficulties: no Relux or heartburn: sometimes: not more than usual, takes reflux medications  Trouble with teeth: no LMP without the use of hormones: 3/14  Weight at that point: N/A Constipation, diarrhea: nothing unusual Dizziness/lightheadedness: no, only once last week Headaches/body aches: on and off Heart racing/chest pain: no Mood: better Sleep: about 5 hrs/night Focus/concentration: has trouble remembering things Cold intolerance: yes Vision changes: no  Mental health diagnosis:   Dietary assessment: A typical day consists of 1-2 meals and 1-2 snacks  Safe foods include: herbal tea, sweet tea, fruit smoothies, fruit, yogurt, rice, shrimp Avoided foods include: red meat, fish, bread, and cashews  24 hour recall:  B (6:15 AM): water + smoothie (fruit + juice) Snk ( AM):   L (12:30 PM):   Snk (4 PM): 1 c pineapple D (5 PM): 1/2 plate roast + onions + red potatoes S (8 pm): 2 hot sausages  Beverages: water (2*16 oz), fruit smoothie  Usual physical activity: walking 30 min   What Methods Do You Use To Control Your Weight (Compensatory behaviors)?           Restricting (calories, fat, carbs)  Food rules or rituals-eats to gain weight, weighs  about  6 times/day to see how weight fluctuates   Estimated energy intake: 800-900 kcals  Estimated energy needs: 2000-2200 calories 225-248g carbohydrates 150-165 g protein 56-61 g fat  Nutritional Diagnosis:  NB-1.5 Disordered eating pattern As related to skipping meals.  As evidenced by dietary recall.    Intervention:  Nutrition education  and counseling. Discussed importance of continuing to nourish body and eat food as tolerated. Discussed listening to body and making time for rest. Pt was in agreement with goals.  Goals: - Continue to aim for more than 1 meal/day. - Have Ensure Plus 2-3 times/day.   Teaching Method Utilized:  Visual Auditory Hands on  Handouts given during visit include:  none  Barriers to learning/adherence to lifestyle change: none identified  Demonstrated degree of understanding via:  Teach Back   Monitoring/Evaluation:  Dietary intake, exercise, and body weight in 4 week(s).

## 2020-04-05 NOTE — Patient Instructions (Signed)
-   Continue to aim for more than 1 meal/day.  - Have Ensure Plus 2-3 times/day.

## 2020-04-11 ENCOUNTER — Telehealth: Payer: Self-pay | Admitting: Neurology

## 2020-04-11 NOTE — Telephone Encounter (Signed)
Received a PA request for sumatriptan 6mg /0.51mL from [facility]. PA was started via 4m. Key is [id number]. Will check back later for question set.

## 2020-04-13 ENCOUNTER — Emergency Department (HOSPITAL_COMMUNITY): Payer: Medicaid Other

## 2020-04-13 ENCOUNTER — Inpatient Hospital Stay (HOSPITAL_COMMUNITY)
Admission: EM | Admit: 2020-04-13 | Discharge: 2020-04-18 | DRG: 389 | Disposition: A | Discharge status: Home / Self Care | Payer: Medicaid Other | Attending: Internal Medicine | Admitting: Internal Medicine

## 2020-04-13 ENCOUNTER — Other Ambulatory Visit: Payer: Self-pay

## 2020-04-13 ENCOUNTER — Encounter (HOSPITAL_COMMUNITY): Payer: Self-pay

## 2020-04-13 DIAGNOSIS — Z79899 Other long term (current) drug therapy: Secondary | ICD-10-CM

## 2020-04-13 DIAGNOSIS — K565 Intestinal adhesions [bands], unspecified as to partial versus complete obstruction: Principal | ICD-10-CM | POA: Diagnosis present

## 2020-04-13 DIAGNOSIS — E039 Hypothyroidism, unspecified: Secondary | ICD-10-CM | POA: Diagnosis present

## 2020-04-13 DIAGNOSIS — Z20822 Contact with and (suspected) exposure to covid-19: Secondary | ICD-10-CM | POA: Diagnosis present

## 2020-04-13 DIAGNOSIS — Z833 Family history of diabetes mellitus: Secondary | ICD-10-CM

## 2020-04-13 DIAGNOSIS — K5652 Intestinal adhesions [bands] with complete obstruction: Principal | ICD-10-CM | POA: Diagnosis present

## 2020-04-13 DIAGNOSIS — M329 Systemic lupus erythematosus, unspecified: Secondary | ICD-10-CM | POA: Diagnosis present

## 2020-04-13 DIAGNOSIS — Z79890 Hormone replacement therapy (postmenopausal): Secondary | ICD-10-CM

## 2020-04-13 DIAGNOSIS — R42 Dizziness and giddiness: Secondary | ICD-10-CM | POA: Diagnosis present

## 2020-04-13 DIAGNOSIS — E876 Hypokalemia: Secondary | ICD-10-CM | POA: Diagnosis present

## 2020-04-13 DIAGNOSIS — K56609 Unspecified intestinal obstruction, unspecified as to partial versus complete obstruction: Secondary | ICD-10-CM | POA: Diagnosis present

## 2020-04-13 DIAGNOSIS — Z8249 Family history of ischemic heart disease and other diseases of the circulatory system: Secondary | ICD-10-CM

## 2020-04-13 DIAGNOSIS — K912 Postsurgical malabsorption, not elsewhere classified: Secondary | ICD-10-CM | POA: Diagnosis present

## 2020-04-13 DIAGNOSIS — Z8639 Personal history of other endocrine, nutritional and metabolic disease: Secondary | ICD-10-CM

## 2020-04-13 DIAGNOSIS — Z888 Allergy status to other drugs, medicaments and biological substances status: Secondary | ICD-10-CM

## 2020-04-13 DIAGNOSIS — H9193 Unspecified hearing loss, bilateral: Secondary | ICD-10-CM | POA: Diagnosis present

## 2020-04-13 DIAGNOSIS — Z7952 Long term (current) use of systemic steroids: Secondary | ICD-10-CM

## 2020-04-13 DIAGNOSIS — M797 Fibromyalgia: Secondary | ICD-10-CM | POA: Diagnosis present

## 2020-04-13 DIAGNOSIS — R109 Unspecified abdominal pain: Secondary | ICD-10-CM | POA: Diagnosis present

## 2020-04-13 DIAGNOSIS — D649 Anemia, unspecified: Secondary | ICD-10-CM | POA: Diagnosis present

## 2020-04-13 DIAGNOSIS — Z9049 Acquired absence of other specified parts of digestive tract: Secondary | ICD-10-CM

## 2020-04-13 DIAGNOSIS — Z8262 Family history of osteoporosis: Secondary | ICD-10-CM

## 2020-04-13 DIAGNOSIS — G43909 Migraine, unspecified, not intractable, without status migrainosus: Secondary | ICD-10-CM | POA: Diagnosis present

## 2020-04-13 DIAGNOSIS — N390 Urinary tract infection, site not specified: Secondary | ICD-10-CM | POA: Diagnosis present

## 2020-04-13 DIAGNOSIS — R1013 Epigastric pain: Secondary | ICD-10-CM

## 2020-04-13 DIAGNOSIS — D509 Iron deficiency anemia, unspecified: Secondary | ICD-10-CM | POA: Diagnosis present

## 2020-04-13 DIAGNOSIS — F419 Anxiety disorder, unspecified: Secondary | ICD-10-CM | POA: Diagnosis present

## 2020-04-13 DIAGNOSIS — Z87440 Personal history of urinary (tract) infections: Secondary | ICD-10-CM

## 2020-04-13 DIAGNOSIS — K219 Gastro-esophageal reflux disease without esophagitis: Secondary | ICD-10-CM | POA: Diagnosis present

## 2020-04-13 DIAGNOSIS — E0500 Thyrotoxicosis with diffuse goiter without thyrotoxic crisis or storm: Secondary | ICD-10-CM | POA: Diagnosis present

## 2020-04-13 DIAGNOSIS — M1990 Unspecified osteoarthritis, unspecified site: Secondary | ICD-10-CM | POA: Diagnosis present

## 2020-04-13 DIAGNOSIS — G43109 Migraine with aura, not intractable, without status migrainosus: Secondary | ICD-10-CM | POA: Diagnosis present

## 2020-04-13 DIAGNOSIS — Z880 Allergy status to penicillin: Secondary | ICD-10-CM

## 2020-04-13 DIAGNOSIS — Z809 Family history of malignant neoplasm, unspecified: Secondary | ICD-10-CM

## 2020-04-13 DIAGNOSIS — N3011 Interstitial cystitis (chronic) with hematuria: Secondary | ICD-10-CM | POA: Diagnosis present

## 2020-04-13 DIAGNOSIS — Z885 Allergy status to narcotic agent status: Secondary | ICD-10-CM

## 2020-04-13 DIAGNOSIS — M3500 Sicca syndrome, unspecified: Secondary | ICD-10-CM | POA: Diagnosis present

## 2020-04-13 LAB — COMPREHENSIVE METABOLIC PANEL
ALT: 16 U/L (ref 0–44)
AST: 21 U/L (ref 15–41)
Albumin: 4.2 g/dL (ref 3.5–5.0)
Alkaline Phosphatase: 70 U/L (ref 38–126)
Anion gap: 13 (ref 5–15)
BUN: 11 mg/dL (ref 6–20)
CO2: 23 mmol/L (ref 22–32)
Calcium: 9.9 mg/dL (ref 8.9–10.3)
Chloride: 107 mmol/L (ref 98–111)
Creatinine, Ser: 0.68 mg/dL (ref 0.44–1.00)
GFR, Estimated: >60 mL/min (ref >60)
Glucose, Bld: 106 mg/dL — ABNORMAL HIGH (ref 70–99)
Potassium: 3.3 mmol/L — ABNORMAL LOW (ref 3.5–5.1)
Sodium: 143 mmol/L (ref 135–145)
Total Bilirubin: 2.1 mg/dL — ABNORMAL HIGH (ref 0.3–1.2)
Total Protein: 8 g/dL (ref 6.5–8.1)

## 2020-04-13 LAB — URINALYSIS, ROUTINE W REFLEX MICROSCOPIC
Bilirubin Urine: NEGATIVE
Glucose, UA: NEGATIVE mg/dL
Hgb urine dipstick: NEGATIVE
Ketones, ur: NEGATIVE mg/dL
Nitrite: NEGATIVE
Protein, ur: 100 mg/dL — AB
Specific Gravity, Urine: 1.013 (ref 1.005–1.030)
WBC, UA: >50 WBC/hpf — ABNORMAL HIGH (ref 0–5)
pH: 9 — ABNORMAL HIGH (ref 5.0–8.0)

## 2020-04-13 LAB — CBC
HCT: 37.7 % (ref 36.0–46.0)
Hemoglobin: 12.2 g/dL (ref 12.0–15.0)
MCH: 29 pg (ref 26.0–34.0)
MCHC: 32.4 g/dL (ref 30.0–36.0)
MCV: 89.8 fL (ref 80.0–100.0)
Platelets: 207 10*3/uL (ref 150–400)
RBC: 4.2 MIL/uL (ref 3.87–5.11)
RDW: 14.7 % (ref 11.5–15.5)
WBC: 5 10*3/uL (ref 4.0–10.5)
nRBC: 0 % (ref 0.0–0.2)

## 2020-04-13 LAB — TROPONIN I (HIGH SENSITIVITY): Troponin I (High Sensitivity): <2 ng/L (ref <18)

## 2020-04-13 LAB — I-STAT BETA HCG BLOOD, ED (MC, WL, AP ONLY): I-stat hCG, quantitative: <5 m[IU]/mL (ref <5)

## 2020-04-13 LAB — LIPASE, BLOOD: Lipase: 38 U/L (ref 11–51)

## 2020-04-13 MED ORDER — DIPHENHYDRAMINE HCL 50 MG/ML IJ SOLN
12.5000 mg | Freq: Once | INTRAMUSCULAR | Status: AC
  Administered 2020-04-13: 12.5 mg via INTRAVENOUS
  Filled 2020-04-13: qty 1

## 2020-04-13 MED ORDER — FENTANYL CITRATE (PF) 100 MCG/2ML IJ SOLN
50.0000 ug | Freq: Once | INTRAMUSCULAR | Status: AC
  Administered 2020-04-13: 50 ug via INTRAVENOUS
  Filled 2020-04-13: qty 2

## 2020-04-13 MED ORDER — FAMOTIDINE IN NACL 20-0.9 MG/50ML-% IV SOLN
20.0000 mg | Freq: Once | INTRAVENOUS | Status: AC
  Administered 2020-04-13: 20 mg via INTRAVENOUS
  Filled 2020-04-13: qty 50

## 2020-04-13 MED ORDER — IOHEXOL 9 MG/ML PO SOLN
ORAL | Status: AC
  Filled 2020-04-13: qty 1000

## 2020-04-13 MED ORDER — ONDANSETRON HCL 4 MG/2ML IJ SOLN
4.0000 mg | Freq: Once | INTRAMUSCULAR | Status: AC
  Administered 2020-04-13: 4 mg via INTRAVENOUS
  Filled 2020-04-13: qty 2

## 2020-04-13 MED ORDER — IOHEXOL 300 MG/ML  SOLN
75.0000 mL | Freq: Once | INTRAMUSCULAR | Status: AC | PRN
  Administered 2020-04-13: 75 mL via INTRAVENOUS

## 2020-04-13 NOTE — ED Triage Notes (Signed)
Patient went to the UC around 5pm, C/C abd pain and distention x2 days, UC called EMS D/T inverted T waves on EKG. Patient has a urostomy, normal output. States she had a fever of 102 yesterday. Nausea w/o vomiting, BM around 4 pm today.

## 2020-04-13 NOTE — ED Provider Notes (Signed)
[facility]-EMERGENCY DEPT Provider Note   CSN: [id number] Arrival date & time: 04/13/20  1806     History Chief Complaint  Patient presents with  . Abdominal Pain    [name] is a 29 y.o. 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 Pain is located to the epigastrium/lower chest, constant, feels achy in nature, no alleviating/aggravating factors. Associated sxs include nausea, abdominal swelling, and fever earlier today. Seen @ UC and sent to the ED due to T wave changes on EKG. Patient denies emesis, melena, hematochezia, dysuria, frequency, flank pain, syncope, leg pain/swelling, hemoptysis, recent surgery/trauma, recent long travel, hormone use, personal hx of cancer, or hx of DVT/PE.    HPI     Past Medical History:  Diagnosis Date  . Anemia   . Anxiety   . Arthritis   . Bilateral hearing loss   . Bladder mass   . Chronic interstitial cystitis   . Chronic interstitial cystitis with hematuria    2016  . Chronic sinusitis   . Fibromyalgia   . GERD (gastroesophageal reflux disease)   . Gross hematuria   . History of Graves' disease    tx done in 2007  . History of migraine   . History of recurrent UTIs   . Hypothyroidism   . Iron deficiency anemia    due to SGS  . Lower urinary tract symptoms (LUTS)   . Lupus [facility])    rheumologist--  dr Janalyn Rouse devashwer  . Malabsorption syndrome   . Migraine   . Migraine   . Passage of loose stools    chronic --  due to short gut syndrome  . Pleuritis   . S/P radioactive iodine thyroid ablation    2007  . SGS (short gut syndrome)   . Short gut syndrome     . Sjogren's disease (HCC)   . Vitiligo   . Wears glasses     Patient Active Problem List   Diagnosis Date Noted  . Paresthesia 04/07/2019  . Chronic migraine 04/07/2019  . Hypersensitivity reaction 01/13/2016  . Iron deficiency anemia due to chronic blood loss 01/05/2016  . Chronic interstitial cystitis with hematuria   . H/O Graves' disease 07/22/2013  . Sjogren's disease (HCC) 07/22/2013  . Malabsorption syndrome 07/22/2013  . Migraine with aura 07/22/2013  . Pelvic adhesive disease 07/22/2013  . Hypothyroidism complicating pregnancy / delivered 12/28/2011    Past Surgical History:  Procedure Laterality Date  . BOWEL RESECTION  newborn   necrotizing enterocolitis (liver patched)  . BRONCHOSCOPY  2014  . CESAREAN SECTION  01/02/2012   Procedure: CESAREAN SECTION;  Surgeon: Lenoard Aden, MD;  Location: [facility];  Service: Gynecology;  Laterality: N/A;  . COLONOSCOPY  07-29-2013  . CYSTECTOMY W/ URETEROILEAL CONDUIT  10/2017  . CYSTOSCOPY WITH BIOPSY N/A 11/23/2014   Procedure: CYSTOSCOPY WITH BLADDER  BIOPSY AND FULGERATION;  Surgeon: Bjorn Pippin, MD;  [facility];  Service: Urology;  Laterality: N/A;  . KNEE ARTHROSCOPY Right 2008  . LIVER SURGERY    . MULTIPLE EXTRACTIONS WITH ALVEOLOPLASTY N/A 03/24/2018   Procedure: EXTRACTIONS X 9;  Surgeon: Ocie Doyne, DDS;  Location: [facility];  Service: Oral Surgery;  Laterality:  N/A;  . SMALL INTESTINE SURGERY    . WISDOM TOOTH EXTRACTION       OB History    Gravida  3   Para  1   Term  1   Preterm  0   AB  1   Living  1     SAB  0   TAB  0   Ectopic  1   Multiple  0   Live Births  1           Family History  Problem Relation Age of Onset  . Diabetes Father   . Endometriosis Mother   . Fibromyalgia Mother   . Heart disease Maternal Grandmother   . Hypertension Maternal Grandmother   . Osteoporosis Maternal Grandmother   . Heart disease Maternal Grandfather    . Hypertension Maternal Grandfather   . Prostate cancer Maternal Grandfather   . Heart disease Paternal Grandmother   . Other Neg Hx   . Colon cancer Neg Hx   . Pancreatic cancer Neg Hx   . Stomach cancer Neg Hx     Social History   Tobacco Use  . Smoking status: Never Smoker  . Smokeless tobacco: Never Used  Substance Use Topics  . Alcohol use: No    Alcohol/week: 0.0 standard drinks  . Drug use: No    Home Medications Prior to Admission medications   Medication Sig Start Date End Date Taking? Authorizing Provider  AMBULATORY NON FORMULARY MEDICATION Iron [facility]    [provider]  cetirizine (ZYRTEC) 5 MG tablet Take 5 mg by mouth daily.    [provider]  cevimeline (EVOXAC) 30 MG capsule Take 1 capsule by mouth 3 (three) times daily as needed. 04/28/19   [provider]  ciprofloxacin (CIPRO) 500 MG tablet Take 1 tablet (500 mg total) by mouth every 12 (twelve) hours. 07/06/19   Horton, Mayer Maskerourtney F, MD  cyclobenzaprine (FLEXERIL) 10 MG tablet Take 1 tablet (10 mg total) by mouth 2 (two) times daily as needed for muscle spasms. 08/10/17   Robinson, [place] N, PA-C  diazepam (DIASTAT ACUDIAL) 10 MG GEL Place 10 mg rectally at bedtime.    [provider]  diphenoxylate-atropine (LOMOTIL) 2.5-0.025 MG tablet One tablet twice a day 03/08/17   Meredith PelGuenther, Paula M, NP  eletriptan (RELPAX) 40 MG tablet Take 1 tablet (40 mg total) by mouth as needed for migraine or headache. May repeat in 2 hours if headache persists or recurs. 04/07/19   Levert FeinsteinYan, Yijun, MD  eletriptan (RELPAX) 40 MG tablet Take 1 tablet (40 mg total) by mouth as needed for migraine or headache. May repeat in 2 hours if headache persists or recurs. 11/09/19   Levert FeinsteinYan, Yijun, MD  EPINEPHrine 0.3 mg/0.3 mL IJ SOAJ injection Inject 0.3 mg into the muscle as needed. 03/31/19   [provider]  Erenumab-aooe (AIMOVIG) 70 MG/ML  SOAJ Inject 70 mg into the skin at bedtime. 11/09/19   Levert FeinsteinYan, Yijun, MD  hydroxychloroquine (PLAQUENIL) 200 MG tablet Take 400 mg by mouth every morning.     [provider]  hydroxypropyl methylcellulose / hypromellose (ISOPTO TEARS / GONIOVISC) 2.5 % ophthalmic solution Place 2 drops into both eyes 3 (three) times daily as needed. 04/27/19   [provider]  levothyroxine (SYNTHROID) 100 MCG tablet Take 175 mcg by mouth daily.  02/27/17   [provider]  omalizumab Geoffry Paradise(XOLAIR) 150 MG/ML prefilled syringe Inject  150 mg into the skin every 28 (twenty-eight) days.     [provider]  OMEPRAZOLE PO Take 20 mg by mouth at bedtime.     [provider]  ondansetron (ZOFRAN-ODT) 8 MG disintegrating tablet Take 1 tablet (8 mg total) by mouth every 8 (eight) hours as needed. 11/09/19   Levert Feinstein, MD  predniSONE (DELTASONE) 5 MG tablet Take 1 tablet by mouth daily. 04/27/19   [provider]  propranolol ER (INDERAL LA) 80 MG 24 hr capsule Take 1 capsule (80 mg total) by mouth at bedtime. 02/09/20   Glean Salvo, NP  QUEtiapine (SEROQUEL) 300 MG tablet Take 300 mg by mouth at bedtime.    [provider]  SUMAtriptan (IMITREX) 6 MG/0.5ML SOLN injection Inject 0.5 mLs (6 mg total) into the skin every 2 (two) hours as needed for migraine or headache. May repeat in 2 hours if headache persists or recurs. 02/09/20   Glean Salvo, NP  tiZANidine (ZANAFLEX) 4 MG tablet Take 1 tablet (4 mg total) by mouth every 8 (eight) hours as needed for muscle spasms. 11/09/19   Levert Feinstein, MD  White Petrolatum-Mineral Oil [facility] PETROL-MINERAL OIL-LANOLIN) 0.1-0.1 % OINT Place 1 application into both eyes at bedtime. 04/28/19   [provider]    Allergies    Penicillins, Buprenorphine hcl, Morphine and related, and Hydrocodone-acetaminophen  Review of Systems   Review of Systems  Constitutional: Positive for fever.  Respiratory: Negative for cough and  shortness of breath.   Cardiovascular: Positive for chest pain. Negative for leg swelling.  Gastrointestinal: Positive for abdominal distention, abdominal pain and nausea. Negative for blood in stool, constipation and vomiting.  Genitourinary: Negative for dysuria and frequency.  Neurological: Negative for syncope.  All other systems reviewed and are negative.   Physical Exam Updated Vital Signs BP (!) 124/91 (BP Location: Left Arm)   Pulse 94   Temp 98.3 F (36.8 C) (Oral)   Resp 19   Ht 5' (1.524 m)   Wt 45 kg   SpO2 100%   BMI 19.38 kg/m   Physical Exam Vitals and nursing note reviewed.  Constitutional:      General: She is not in acute distress.    Appearance: She is well-developed. She is not toxic-appearing.  HENT:     Head: Normocephalic and atraumatic.  Eyes:     General:        Right eye: No discharge.        Left eye: No discharge.     Conjunctiva/sclera: Conjunctivae normal.  Cardiovascular:     Rate and Rhythm: Normal rate and regular rhythm.  Pulmonary:     Effort: Pulmonary effort is normal. No respiratory distress.     Breath sounds: Normal breath sounds. No wheezing, rhonchi or rales.  Abdominal:     General: There is distension (mild).     Palpations: Abdomen is soft.     Tenderness: There is abdominal tenderness in the epigastric area. There is no guarding or rebound.     Comments: Urostomy noted right lower quadrant  Musculoskeletal:     Cervical back: Neck supple.     Comments: No asymmetric edema to LEs.   Skin:    General: Skin is warm and dry.     Comments: Changes consistent w/ vitiligo.   Neurological:     Mental Status: She is alert.     Comments: Clear speech.   Psychiatric:        Behavior: Behavior  normal.     ED Results / Procedures / Treatments   Labs (all labs ordered are listed, but only abnormal results are displayed) Labs Reviewed  COMPREHENSIVE METABOLIC PANEL - Abnormal; Notable for the following components:       Result Value   Potassium 3.3 (*)    Glucose, Bld 106 (*)    Total Bilirubin 2.1 (*)    All other components within normal limits  URINALYSIS, ROUTINE W REFLEX MICROSCOPIC - Abnormal; Notable for the following components:   Color, Urine AMBER (*)    APPearance CLOUDY (*)    pH 9.0 (*)    Protein, ur 100 (*)    Leukocytes,Ua SMALL (*)    WBC, UA >50 (*)    Bacteria, UA MANY (*)    All other components within normal limits  URINE CULTURE  RESPIRATORY PANEL BY RT PCR (FLU A&B, COVID)  LIPASE, BLOOD  CBC  I-STAT BETA HCG BLOOD, ED ([facility], [facility], [facility] ONLY)  TROPONIN I (HIGH SENSITIVITY)  TROPONIN I (HIGH SENSITIVITY)    EKG None  EKG Interpretation  Date/Time: 04/13/20 @ 18:23    Ventricular Rate: 68 bpm      QRS Duration: 79 ms   QT Interval: 353   QTC Calculation: 376   R Axis:  normal   Text Interpretation: NSR, nonspecific T wave changes, no STEMI     Vent. rate 68 BPM PR interval * ms QRS duration 79 ms QT/QTc 353/376 ms P-R-T axes 130 58 34  Radiology CT Abdomen Pelvis W Contrast  Result Date: 04/14/2020 CLINICAL DATA:  29 year old female with abdominal pain. EXAM: CT ABDOMEN AND PELVIS WITH CONTRAST TECHNIQUE: Multidetector CT imaging of the abdomen and pelvis was performed using the standard protocol following bolus administration of intravenous contrast. CONTRAST:  75mL OMNIPAQUE IOHEXOL 300 MG/ML  SOLN COMPARISON:  CT abdomen pelvis dated 07/06/2019. FINDINGS: Lower chest: The visualized lung bases are clear. No intra-abdominal free air or free fluid. Hepatobiliary: No focal liver abnormality is seen. No gallstones, gallbladder wall thickening, or biliary dilatation. Pancreas: Unremarkable. No pancreatic ductal dilatation or surrounding inflammatory changes. Spleen: Normal in size without focal abnormality. Adrenals/Urinary Tract: The adrenal glands unremarkable. Subcentimeter right renal inferior pole hypodense focus is not characterized. There is no hydronephrosis on  either side. Status post cystectomy with right lower quadrant urostomy. Stomach/Bowel: Postsurgical changes of bowel with anastomotic sutures. Evaluation of the bowel is limited in the absence of oral contrast. Multiple dilated loops of small bowel throughout the abdomen measuring up to 3.6 cm in caliber. A transition may be present in the right lower quadrant (coronal 38/4 and axial 56/2). CT with oral contrast may provide better evaluation. There is loose stool and air within the colon. Vascular/Lymphatic: The abdominal aorta and IVC unremarkable. No portal venous gas. There is no adenopathy. Reproductive: The uterus and ovaries are grossly unremarkable. No pelvic mass. Other: Anterior abdominal wall surgical scar. Musculoskeletal: No acute or significant osseous findings. Sacral stimulator noted. IMPRESSION: 1. Dilated loops of small bowel may represent ileus but concerning for small-bowel obstruction with possible transition in the right lower quadrant. CT with oral contrast may provide better evaluation. 2. Status post cystectomy with right lower quadrant urostomy. Electronically Signed   By: Elgie Collard M.D.   On: 04/14/2020 00:05   DG Chest Portable 1 View  Result Date: 04/13/2020 CLINICAL DATA:  Chest pain for several hours EXAM: PORTABLE CHEST 1 VIEW COMPARISON:  11/26/2017 FINDINGS: Cardiac shadow is within normal limits. The  lungs are well aerated bilaterally. No focal infiltrate or sizable effusion is seen. No bony abnormality is noted. IMPRESSION: No acute abnormality seen. Electronically Signed   By: Alcide Clever M.D.   On: 04/13/2020 23:06    Procedures Procedures (including critical care time)  Medications Ordered in ED Medications  iohexol (OMNIPAQUE) 9 MG/ML oral solution (has no administration in time range)  fentaNYL (SUBLIMAZE) injection 50 mcg (50 mcg Intravenous Given 04/13/20 2315)  ondansetron (ZOFRAN) injection 4 mg (4 mg Intravenous Given 04/13/20 2312)  famotidine  (PEPCID) IVPB 20 mg premix (0 mg Intravenous Stopped 04/13/20 2331)  diphenhydrAMINE (BENADRYL) injection 12.5 mg (12.5 mg Intravenous Given 04/13/20 2338)  iohexol (OMNIPAQUE) 300 MG/ML solution 75 mL (75 mLs Intravenous Contrast Given 04/13/20 2339)  pantoprazole (PROTONIX) injection 40 mg (40 mg Intravenous Given 04/14/20 0101)    ED Course  I have reviewed the triage vital signs and the nursing notes.  Pertinent labs & imaging results that were available during my care of the patient were reviewed by me and considered in my medical decision making (see chart for details).    MDM Rules/Calculators/A&P                         Patient presents to the ED with complaints of chest/abdominal pain.  Additional history obtained:  Additional history obtained from chart review & nursing note review.  EKG: No STEMI Lab Tests:  I reviewed and interpreted labs, which included:  CBC, CMP, lipase, troponin, pregnancy test: Fairly unremarkable.  Mild hypokalemia. Urinalysis: Many bacteria, patient states she is not having symptoms like prior UTIs, likely colonized.  Imaging Studies ordered:  I ordered imaging studies which included CXR & CT A/P, I independently visualized and interpreted imaging which showed:  CXR: No acute process   From a chest pain standpoint, nonspecific T wave changes on EKG, troponin is not significantly elevated with greater than 6 hours of pain, I have a low suspicion for ACS.  Patient is low risk Wells, doubt pulmonary embolism.  No widened mediastinum on chest x-ray, normotensive, doubt dissection.  Laboratory testing and chest x-ray reassuring.  Fentanyl, Pepcid, and Zofran given for symptomatic management.  Patient began having itchiness after administration of fentanyl, given Benadryl with resolution.  Airway patent.  No urticaria.  CT A/P:  1. Dilated loops of small bowel may represent ileus but concerning for small-bowel obstruction with possible transition in the  right lower quadrant. CT with oral contrast may provide better evaluation. 2. Status post cystectomy with right lower quadrant urostomy.  On reassessment after CT imaging patient states she remains uncomfortable.  Discussed her CT results as well as options of plan of care, she would feel comfortable staying in the hospital for continued observation and management. Clinically patient does not necessarily seem to have SBO- she is not vomiting, she had a BM today and is passing gas, with these findings will hold off on NG tube placement at this time and admit to hospitalist service for continued observation & management.   01:01: CONSULT: Discussed case with hospitalist Dr. Toniann Fail - accepst admission.   Findings and plan of care discussed with supervising physician Dr. Estell Harpin who evaluated patient & subsequently Dr. Dennie Bible @ shift change who are in agreement.   Portions of this note were generated with Scientist, clinical (histocompatibility and immunogenetics). Dictation errors may occur despite best attempts at proofreading.  Final Clinical Impression(s) / ED Diagnoses Final diagnoses:  Epigastric pain  Rx / DC Orders ED Discharge Orders    None       Cherly Anderson, PA-C 04/14/20 0158    Bethann Berkshire, MD 04/15/20 1119

## 2020-04-14 ENCOUNTER — Encounter (HOSPITAL_COMMUNITY): Payer: Self-pay | Admitting: Internal Medicine

## 2020-04-14 ENCOUNTER — Inpatient Hospital Stay (HOSPITAL_COMMUNITY): Payer: Medicaid Other

## 2020-04-14 DIAGNOSIS — E876 Hypokalemia: Secondary | ICD-10-CM | POA: Diagnosis present

## 2020-04-14 DIAGNOSIS — R1013 Epigastric pain: Secondary | ICD-10-CM | POA: Diagnosis not present

## 2020-04-14 DIAGNOSIS — Z880 Allergy status to penicillin: Secondary | ICD-10-CM | POA: Diagnosis not present

## 2020-04-14 DIAGNOSIS — K56609 Unspecified intestinal obstruction, unspecified as to partial versus complete obstruction: Secondary | ICD-10-CM | POA: Diagnosis not present

## 2020-04-14 DIAGNOSIS — F419 Anxiety disorder, unspecified: Secondary | ICD-10-CM | POA: Diagnosis present

## 2020-04-14 DIAGNOSIS — D509 Iron deficiency anemia, unspecified: Secondary | ICD-10-CM | POA: Diagnosis present

## 2020-04-14 DIAGNOSIS — G43109 Migraine with aura, not intractable, without status migrainosus: Secondary | ICD-10-CM

## 2020-04-14 DIAGNOSIS — N3011 Interstitial cystitis (chronic) with hematuria: Secondary | ICD-10-CM

## 2020-04-14 DIAGNOSIS — M3500 Sicca syndrome, unspecified: Secondary | ICD-10-CM

## 2020-04-14 DIAGNOSIS — K5652 Intestinal adhesions [bands] with complete obstruction: Secondary | ICD-10-CM | POA: Diagnosis present

## 2020-04-14 DIAGNOSIS — Z8262 Family history of osteoporosis: Secondary | ICD-10-CM | POA: Diagnosis not present

## 2020-04-14 DIAGNOSIS — K565 Intestinal adhesions [bands], unspecified as to partial versus complete obstruction: Secondary | ICD-10-CM | POA: Diagnosis present

## 2020-04-14 DIAGNOSIS — E039 Hypothyroidism, unspecified: Secondary | ICD-10-CM | POA: Diagnosis present

## 2020-04-14 DIAGNOSIS — Z8639 Personal history of other endocrine, nutritional and metabolic disease: Secondary | ICD-10-CM | POA: Diagnosis not present

## 2020-04-14 DIAGNOSIS — K219 Gastro-esophageal reflux disease without esophagitis: Secondary | ICD-10-CM | POA: Diagnosis present

## 2020-04-14 DIAGNOSIS — M1990 Unspecified osteoarthritis, unspecified site: Secondary | ICD-10-CM | POA: Diagnosis present

## 2020-04-14 DIAGNOSIS — N390 Urinary tract infection, site not specified: Secondary | ICD-10-CM

## 2020-04-14 DIAGNOSIS — Z20822 Contact with and (suspected) exposure to covid-19: Secondary | ICD-10-CM | POA: Diagnosis present

## 2020-04-14 DIAGNOSIS — Z833 Family history of diabetes mellitus: Secondary | ICD-10-CM | POA: Diagnosis not present

## 2020-04-14 DIAGNOSIS — M797 Fibromyalgia: Secondary | ICD-10-CM | POA: Diagnosis present

## 2020-04-14 DIAGNOSIS — R109 Unspecified abdominal pain: Secondary | ICD-10-CM | POA: Diagnosis present

## 2020-04-14 DIAGNOSIS — H9193 Unspecified hearing loss, bilateral: Secondary | ICD-10-CM | POA: Diagnosis present

## 2020-04-14 DIAGNOSIS — K912 Postsurgical malabsorption, not elsewhere classified: Secondary | ICD-10-CM | POA: Diagnosis present

## 2020-04-14 DIAGNOSIS — Z888 Allergy status to other drugs, medicaments and biological substances status: Secondary | ICD-10-CM | POA: Diagnosis not present

## 2020-04-14 DIAGNOSIS — Z885 Allergy status to narcotic agent status: Secondary | ICD-10-CM | POA: Diagnosis not present

## 2020-04-14 DIAGNOSIS — E0500 Thyrotoxicosis with diffuse goiter without thyrotoxic crisis or storm: Secondary | ICD-10-CM | POA: Diagnosis present

## 2020-04-14 DIAGNOSIS — Z87440 Personal history of urinary (tract) infections: Secondary | ICD-10-CM | POA: Diagnosis not present

## 2020-04-14 DIAGNOSIS — G43909 Migraine, unspecified, not intractable, without status migrainosus: Secondary | ICD-10-CM | POA: Diagnosis present

## 2020-04-14 DIAGNOSIS — D649 Anemia, unspecified: Secondary | ICD-10-CM | POA: Diagnosis present

## 2020-04-14 LAB — CBC WITH DIFFERENTIAL/PLATELET
Abs Immature Granulocytes: 0.01 10*3/uL (ref 0.00–0.07)
Basophils Absolute: 0 10*3/uL (ref 0.0–0.1)
Basophils Relative: 0 %
Eosinophils Absolute: 0.1 10*3/uL (ref 0.0–0.5)
Eosinophils Relative: 1 %
HCT: 33 % — ABNORMAL LOW (ref 36.0–46.0)
Hemoglobin: 10.7 g/dL — ABNORMAL LOW (ref 12.0–15.0)
Immature Granulocytes: 0 %
Lymphocytes Relative: 23 %
Lymphs Abs: 1.1 10*3/uL (ref 0.7–4.0)
MCH: 28.9 pg (ref 26.0–34.0)
MCHC: 32.4 g/dL (ref 30.0–36.0)
MCV: 89.2 fL (ref 80.0–100.0)
Monocytes Absolute: 0.3 10*3/uL (ref 0.1–1.0)
Monocytes Relative: 5 %
Neutro Abs: 3.4 10*3/uL (ref 1.7–7.7)
Neutrophils Relative %: 71 %
Platelets: 152 10*3/uL (ref 150–400)
RBC: 3.7 MIL/uL — ABNORMAL LOW (ref 3.87–5.11)
RDW: 14.7 % (ref 11.5–15.5)
WBC: 4.8 10*3/uL (ref 4.0–10.5)
nRBC: 0 % (ref 0.0–0.2)

## 2020-04-14 LAB — RESPIRATORY PANEL BY RT PCR (FLU A&B, COVID)
Influenza A by PCR: NEGATIVE
Influenza B by PCR: NEGATIVE
SARS Coronavirus 2 by RT PCR: NEGATIVE

## 2020-04-14 LAB — COMPREHENSIVE METABOLIC PANEL
ALT: 16 U/L (ref 0–44)
AST: 21 U/L (ref 15–41)
Albumin: 3.4 g/dL — ABNORMAL LOW (ref 3.5–5.0)
Alkaline Phosphatase: 61 U/L (ref 38–126)
Anion gap: 8 (ref 5–15)
BUN: 9 mg/dL (ref 6–20)
CO2: 21 mmol/L — ABNORMAL LOW (ref 22–32)
Calcium: 8.8 mg/dL — ABNORMAL LOW (ref 8.9–10.3)
Chloride: 108 mmol/L (ref 98–111)
Creatinine, Ser: 0.71 mg/dL (ref 0.44–1.00)
GFR, Estimated: >60 mL/min (ref >60)
Glucose, Bld: 126 mg/dL — ABNORMAL HIGH (ref 70–99)
Potassium: 3.2 mmol/L — ABNORMAL LOW (ref 3.5–5.1)
Sodium: 137 mmol/L (ref 135–145)
Total Bilirubin: 1.9 mg/dL — ABNORMAL HIGH (ref 0.3–1.2)
Total Protein: 6.3 g/dL — ABNORMAL LOW (ref 6.5–8.1)

## 2020-04-14 LAB — GLUCOSE, CAPILLARY
Glucose-Capillary: 84 mg/dL (ref 70–99)
Glucose-Capillary: 100 mg/dL — ABNORMAL HIGH (ref 70–99)

## 2020-04-14 LAB — HIV ANTIBODY (ROUTINE TESTING W REFLEX): HIV Screen 4th Generation wRfx: NONREACTIVE

## 2020-04-14 LAB — TROPONIN I (HIGH SENSITIVITY): Troponin I (High Sensitivity): <2 ng/L (ref <18)

## 2020-04-14 LAB — MAGNESIUM: Magnesium: 1.7 mg/dL (ref 1.7–2.4)

## 2020-04-14 MED ORDER — LEVOTHYROXINE SODIUM 100 MCG/5ML IV SOLN
87.5000 ug | Freq: Every day | INTRAVENOUS | Status: DC
  Administered 2020-04-14 – 2020-04-18 (×5): 87.5 ug via INTRAVENOUS
  Filled 2020-04-14 (×5): qty 5

## 2020-04-14 MED ORDER — DEXTROSE-NACL 5-0.9 % IV SOLN: INTRAVENOUS | Status: DC

## 2020-04-14 MED ORDER — ACETAMINOPHEN 325 MG PO TABS
650.0000 mg | ORAL_TABLET | Freq: Four times a day (QID) | ORAL | Status: DC | PRN
  Administered 2020-04-16: 650 mg via ORAL
  Filled 2020-04-14: qty 2

## 2020-04-14 MED ORDER — CIPROFLOXACIN IN D5W 400 MG/200ML IV SOLN
400.0000 mg | Freq: Two times a day (BID) | INTRAVENOUS | Status: DC
  Administered 2020-04-14 – 2020-04-17 (×7): 400 mg via INTRAVENOUS
  Filled 2020-04-14 (×7): qty 200

## 2020-04-14 MED ORDER — DIPHENHYDRAMINE HCL 50 MG/ML IJ SOLN
12.5000 mg | Freq: Once | INTRAMUSCULAR | Status: AC
  Administered 2020-04-14: 12.5 mg via INTRAVENOUS
  Filled 2020-04-14: qty 1

## 2020-04-14 MED ORDER — METOPROLOL TARTRATE 5 MG/5ML IV SOLN
2.5000 mg | Freq: Four times a day (QID) | INTRAVENOUS | Status: DC
  Administered 2020-04-14 – 2020-04-16 (×9): 2.5 mg via INTRAVENOUS
  Filled 2020-04-14 (×10): qty 5

## 2020-04-14 MED ORDER — POTASSIUM CHLORIDE 10 MEQ/100ML IV SOLN
10.0000 meq | INTRAVENOUS | Status: AC
  Administered 2020-04-14 (×4): 10 meq via INTRAVENOUS
  Filled 2020-04-14 (×4): qty 100

## 2020-04-14 MED ORDER — DIATRIZOATE MEGLUMINE & SODIUM 66-10 % PO SOLN
90.0000 mL | Freq: Once | ORAL | Status: AC
  Administered 2020-04-14: 90 mL via NASOGASTRIC
  Filled 2020-04-14: qty 90

## 2020-04-14 MED ORDER — FENTANYL CITRATE (PF) 100 MCG/2ML IJ SOLN
12.5000 ug | INTRAMUSCULAR | Status: DC | PRN
  Administered 2020-04-14: 12.5 ug via INTRAVENOUS
  Filled 2020-04-14: qty 2

## 2020-04-14 MED ORDER — ACETAMINOPHEN 650 MG RE SUPP: 650.0000 mg | Freq: Four times a day (QID) | RECTAL | Status: DC | PRN

## 2020-04-14 MED ORDER — MAGNESIUM SULFATE 4 GM/100ML IV SOLN
4.0000 g | Freq: Once | INTRAVENOUS | Status: AC
  Administered 2020-04-14: 4 g via INTRAVENOUS
  Filled 2020-04-14: qty 100

## 2020-04-14 MED ORDER — PANTOPRAZOLE SODIUM 40 MG IV SOLR
40.0000 mg | Freq: Once | INTRAVENOUS | Status: AC
  Administered 2020-04-14: 40 mg via INTRAVENOUS
  Filled 2020-04-14: qty 40

## 2020-04-14 NOTE — ED Notes (Signed)
ED TO INPATIENT HANDOFF REPORT  Name/Age/Gender [name] 29 y.o. female  Code Status Code Status History    Date Active Date Inactive Code Status Order ID Comments User Context   01/03/2012 0154 01/05/2012 1532 Full Code [id number]  Jamie Linsey, RN Inpatient   12/29/2011 2126 01/03/2012 0154 Full Code [id number]  [name], CNM Inpatient   12/28/2011 2003 12/29/2011 1720 Full Code [id number]  Jamie Ramming, RN Inpatient   Advance Care Planning Activity    Questions for Most Recent Historical Code Status (Order [id number])       Home/SNF/Other Home  Chief Complaint Abdominal pain [R10.9]  Level of Care/Admitting Diagnosis ED Disposition    ED Disposition Condition Comment   Admit  Hospital Area: [facility] [[id number]]  Level of Care: Telemetry [5]  Admit to tele based on following criteria: Monitor for Ischemic changes  May admit patient to [facility] or [facility] if equivalent level of care is available:: No  Covid Evaluation: Asymptomatic Screening Protocol (No Symptoms)  Diagnosis: Abdominal pain [947096]  Admitting Physician: [name] [[id number]]  Attending Physician: [name] [id number]  Estimated length of stay: past midnight tomorrow  Certification:: I certify this patient will need inpatient services for at least 2 midnights       Medical History Past Medical History:  Diagnosis Date  . Anemia   . Anxiety   . Arthritis   . Bilateral hearing loss   . Bladder mass   . Chronic interstitial cystitis   . Chronic interstitial cystitis with hematuria    2016  . Chronic sinusitis   . Fibromyalgia   . GERD (gastroesophageal reflux disease)   . Gross hematuria   . History of Graves' disease    tx done in 2007  . History of migraine   . History of recurrent UTIs   . Hypothyroidism   . Iron deficiency anemia    due to SGS  . Lower urinary tract symptoms (LUTS)   . Lupus [facility])    rheumologist--  dr [name]  Bryan  . Malabsorption syndrome   . Migraine   . Migraine   . Passage of loose stools    chronic --  due to short gut syndrome  . Pleuritis   . S/P radioactive iodine thyroid ablation    2007  . SGS (short gut syndrome)   . Short gut syndrome   . Sjogren's disease (HCC)   . Vitiligo   . Wears glasses     Allergies Allergies  Allergen Reactions  . Penicillins Anaphylaxis  . Buprenorphine Hcl Itching  . Morphine And Related Itching  . Hydrocodone-Acetaminophen Rash    IV Location/Drains/Wounds Patient Lines/Drains/Airways Status    Active Line/Drains/Airways    Name Placement date Placement time Site Days   Peripheral IV 04/13/20 Left Antecubital 04/13/20  2308  Antecubital  1   Incision (Closed) 11/23/14 Perineum Other (Comment) 11/23/14  0853   1969   Incision (Closed) 03/24/18 Lip Other (Comment) 03/24/18  0858   752          Labs/Imaging Results for orders placed or performed during the hospital encounter of 04/13/20 (from the past 48 hour(s))  Urinalysis, Routine w reflex microscopic     Status: Abnormal   Collection Time: 04/13/20  6:17 PM  Result Value Ref Range   Color, Urine AMBER (A) YELLOW    Comment: BIOCHEMICALS MAY BE AFFECTED BY COLOR   APPearance CLOUDY (A) CLEAR   Specific  Gravity, Urine 1.013 1.005 - 1.030   pH 9.0 (H) 5.0 - 8.0   Glucose, UA NEGATIVE NEGATIVE mg/dL   Hgb urine dipstick NEGATIVE NEGATIVE   Bilirubin Urine NEGATIVE NEGATIVE   Ketones, ur NEGATIVE NEGATIVE mg/dL   Protein, ur 950 (A) NEGATIVE mg/dL   Nitrite NEGATIVE NEGATIVE   Leukocytes,Ua SMALL (A) NEGATIVE   RBC / HPF 11-20 0 - 5 RBC/hpf   WBC, UA >50 (H) 0 - 5 WBC/hpf   Bacteria, UA MANY (A) NONE SEEN   Mucus PRESENT    Amorphous Crystal PRESENT     Comment: Performed at [facility], [address] [place], [place] [address]  Lipase, blood     Status: None   Collection Time: 04/13/20  6:31 PM  Result Value Ref Range   Lipase 38 11 - 51 U/L     Comment: Performed at [facility], [address] [place], [place] [address]  Comprehensive metabolic panel     Status: Abnormal   Collection Time: 04/13/20  6:31 PM  Result Value Ref Range   Sodium 143 135 - 145 mmol/L   Potassium 3.3 (L) 3.5 - 5.1 mmol/L   Chloride 107 98 - 111 mmol/L   CO2 23 22 - 32 mmol/L   Glucose, Bld 106 (H) 70 - 99 mg/dL    Comment: Glucose reference range applies only to samples taken after fasting for at least 8 hours.   BUN 11 6 - 20 mg/dL   Creatinine, Ser 0.99 0.44 - 1.00 mg/dL   Calcium 9.9 8.9 - 83.3 mg/dL   Total Protein 8.0 6.5 - 8.1 g/dL   Albumin 4.2 3.5 - 5.0 g/dL   AST 21 15 - 41 U/L   ALT 16 0 - 44 U/L   Alkaline Phosphatase 70 38 - 126 U/L   Total Bilirubin 2.1 (H) 0.3 - 1.2 mg/dL   GFR, Estimated >82 >50 mL/min   Anion gap 13 5 - 15    Comment: Performed at [facility], [address], [place], [place] [address]  CBC     Status: None   Collection Time: 04/13/20  6:31 PM  Result Value Ref Range   WBC 5.0 4.0 - 10.5 K/uL   RBC 4.20 3.87 - 5.11 MIL/uL   Hemoglobin 12.2 12.0 - 15.0 g/dL   HCT 73.4 36 - 46 %   MCV 89.8 80.0 - 100.0 fL   MCH 29.0 26.0 - 34.0 pg   MCHC 32.4 30.0 - 36.0 g/dL   RDW 19.3 79.0 - 24.0 %   Platelets 207 150 - 400 K/uL   nRBC 0.0 0.0 - 0.2 %    Comment: Performed at [facility], [address] [place], [place] [address]  I-Stat beta hCG blood, ED     Status: None   Collection Time: 04/13/20  6:40 PM  Result Value Ref Range   I-stat hCG, quantitative <5.0 <5 mIU/mL   Comment 3            Comment:   GEST. AGE      CONC.  (mIU/mL)   <=1 WEEK        5 - 50     2 WEEKS       50 - 500     3 WEEKS       100 - 10,000     4 WEEKS     1,000 - 30,000  FEMALE AND NON-PREGNANT FEMALE:     LESS THAN 5 mIU/mL   Troponin I (High Sensitivity)     Status: None   Collection Time: 04/13/20 10:46 PM  Result Value Ref Range   Troponin I (High Sensitivity) <2  <18 ng/L    Comment: (NOTE) Elevated high sensitivity troponin I (hsTnI) values and significant  changes across serial measurements may suggest ACS but many other  chronic and acute conditions are known to elevate hsTnI results.  Refer to the "Links" section for chest pain algorithms and additional  guidance. Performed at [facility], [address] [place], [place] [id number]    CT Abdomen Pelvis W Contrast  Result Date: 04/14/2020 CLINICAL DATA:  29 year old female with abdominal pain. EXAM: CT ABDOMEN AND PELVIS WITH CONTRAST TECHNIQUE: Multidetector CT imaging of the abdomen and pelvis was performed using the standard protocol following bolus administration of intravenous contrast. CONTRAST:  75mL OMNIPAQUE IOHEXOL 300 MG/ML  SOLN COMPARISON:  CT abdomen pelvis dated 07/06/2019. FINDINGS: Lower chest: The visualized lung bases are clear. No intra-abdominal free air or free fluid. Hepatobiliary: No focal liver abnormality is seen. No gallstones, gallbladder wall thickening, or biliary dilatation. Pancreas: Unremarkable. No pancreatic ductal dilatation or surrounding inflammatory changes. Spleen: Normal in size without focal abnormality. Adrenals/Urinary Tract: The adrenal glands unremarkable. Subcentimeter right renal inferior pole hypodense focus is not characterized. There is no hydronephrosis on either side. Status post cystectomy with right lower quadrant urostomy. Stomach/Bowel: Postsurgical changes of bowel with anastomotic sutures. Evaluation of the bowel is limited in the absence of oral contrast. Multiple dilated loops of small bowel throughout the abdomen measuring up to 3.6 cm in caliber. A transition may be present in the right lower quadrant (coronal 38/4 and axial 56/2). CT with oral contrast may provide better evaluation. There is loose stool and air within the colon. Vascular/Lymphatic: The abdominal aorta and IVC unremarkable. No portal venous gas. There is no  adenopathy. Reproductive: The uterus and ovaries are grossly unremarkable. No pelvic mass. Other: Anterior abdominal wall surgical scar. Musculoskeletal: No acute or significant osseous findings. Sacral stimulator noted. IMPRESSION: 1. Dilated loops of small bowel may represent ileus but concerning for small-bowel obstruction with possible transition in the right lower quadrant. CT with oral contrast may provide better evaluation. 2. Status post cystectomy with right lower quadrant urostomy. Electronically Signed   By: Elgie CollardArash  Radparvar M.D.   On: 04/14/2020 00:05   DG Chest Portable 1 View  Result Date: 04/13/2020 CLINICAL DATA:  Chest pain for several hours EXAM: PORTABLE CHEST 1 VIEW COMPARISON:  11/26/2017 FINDINGS: Cardiac shadow is within normal limits. The lungs are well aerated bilaterally. No focal infiltrate or sizable effusion is seen. No bony abnormality is noted. IMPRESSION: No acute abnormality seen. Electronically Signed   By: Alcide CleverMark  Lukens M.D.   On: 04/13/2020 23:06    Pending Labs Unresulted Labs (From admission, onward)          Start     Ordered   04/14/20 0046  Respiratory Panel by RT PCR (Flu A&B, Covid) - Nasopharyngeal Swab  (Tier 2 (TAT 2 hrs))  Once,   STAT       Question Answer Comment  Is this test for diagnosis or screening Screening   Symptomatic for COVID-19 as defined by CDC No   Hospitalized for COVID-19 No   Admitted to ICU for COVID-19 No   Previously tested for COVID-19 Yes   Resident in a congregate (group) care setting Unknown  Employed in healthcare setting Unknown   Pregnant Unknown   Has patient completed COVID vaccination(s) (2 doses of Pfizer/Moderna 1 dose of [employer]) Unknown      04/14/20 0045   04/14/20 0003  Urine culture  Add-on,   AD        04/14/20 0002          Vitals/Pain Today's Vitals   04/14/20 0000 04/14/20 0030 04/14/20 0100 04/14/20 0200  BP: 112/84 106/81 115/81 101/77  Pulse: 66 60 (!) 57 60  Resp: 16  16 17    Temp:    98.2 F (36.8 C)  TempSrc:    Oral  SpO2: 97% 97% 100% 98%  Weight:      Height:      PainSc:        Isolation Precautions No active isolations  Medications Medications  iohexol (OMNIPAQUE) 9 MG/ML oral solution (has no administration in time range)  fentaNYL (SUBLIMAZE) injection 50 mcg (50 mcg Intravenous Given 04/13/20 2315)  ondansetron (ZOFRAN) injection 4 mg (4 mg Intravenous Given 04/13/20 2312)  famotidine (PEPCID) IVPB 20 mg premix (0 mg Intravenous Stopped 04/13/20 2331)  diphenhydrAMINE (BENADRYL) injection 12.5 mg (12.5 mg Intravenous Given 04/13/20 2338)  iohexol (OMNIPAQUE) 300 MG/ML solution 75 mL (75 mLs Intravenous Contrast Given 04/13/20 2339)  pantoprazole (PROTONIX) injection 40 mg (40 mg Intravenous Given 04/14/20 0101)    Mobility walks

## 2020-04-14 NOTE — Consult Note (Signed)
[facility] Surgery Consult Note  [name] 20-Apr-1991  [id number].    Requesting MD: Ramiro Harvest, MD Chief Complaint/Reason for Consult: SBO vs ileus  HPI:  [name] is a 29 y/o F with a PMH Lupus on plaquenil/prednisone, Sjogren's syndrome, migraines, Grave's disease s/p ablation, interstitial cystitis s/p cystectomy/urostomy 2019, and hx of necrotizing enterocolitis as an infant who underwent multiple abdominal surgeries and small bowel resections who presented to the ED early this AM with a cc abdominal pain. States two days ago she developed nausea with "pooling" of saliva in her mouth following by upper abdominal pain and progressive abdominal bloating. Abdominal pain is non-radiating. Denies similar pain or known SBO in past. Denies alleviating factors - tried tylenol. She got poor sleep last night and the night before and her symptoms were not improving. She also reports fever at home. Last BM was yesterday and was described as normal. At baseline patient reports cramping abdominal pain with PO intake that is worse with solids, states she recent asked her dietician if she could just drink liquids rather than eat due to this. She does reports a history of parastomal hernia but never obstruction related to this.   Blood thinners: none, reports chronic anemia requiring intermittent transfusions and iron infusions, has not tolerated PO iron. Substance use: denies tobacco, alcohol, or other drug use. Abdominal surgeries: c-section x1 2015, cystectomy w/ ileal conduit diversion 2019 Dr. Logan Bores, multiple bowel resections as an infant - states she had over half of her small bowel removed.   ROS: Review of Systems  Neurological: Negative for focal weakness.    Family History  Problem Relation Age of Onset  . Diabetes Father   . Endometriosis Mother   . Fibromyalgia Mother   . Heart disease Maternal Grandmother   . Hypertension Maternal Grandmother   .  Osteoporosis Maternal Grandmother   . Heart disease Maternal Grandfather   . Hypertension Maternal Grandfather   . Prostate cancer Maternal Grandfather   . Heart disease Paternal Grandmother   . Other Neg Hx   . Colon cancer Neg Hx   . Pancreatic cancer Neg Hx   . Stomach cancer Neg Hx     Past Medical History:  Diagnosis Date  . Anemia   . Anxiety   . Arthritis   . Bilateral hearing loss   . Bladder mass   . Chronic interstitial cystitis   . Chronic interstitial cystitis with hematuria    2016  . Chronic sinusitis   . Fibromyalgia   . GERD (gastroesophageal reflux disease)   . Gross hematuria   . History of Graves' disease    tx done in 2007  . History of migraine   . History of recurrent UTIs   . Hypothyroidism   . Iron deficiency anemia    due to SGS  . Lower urinary tract symptoms (LUTS)   . Lupus [facility])    rheumologist--  dr Janalyn Rouse devashwer  . Malabsorption syndrome   . Migraine   . Migraine   . Passage of loose stools    chronic --  due to short gut syndrome  . Pleuritis   . S/P radioactive iodine thyroid ablation    2007  . SGS (short gut syndrome)   . Short gut syndrome   . Sjogren's disease (HCC)   . Vitiligo   . Wears glasses     Past Surgical History:  Procedure Laterality Date  . BOWEL RESECTION  newborn   necrotizing enterocolitis (liver  patched)  . BRONCHOSCOPY  2014  . CESAREAN SECTION  01/02/2012   Procedure: CESAREAN SECTION;  Surgeon: Lenoard Aden, MD;  Location: [facility];  Service: Gynecology;  Laterality: N/A;  . COLONOSCOPY  07-29-2013  . CYSTECTOMY W/ URETEROILEAL CONDUIT  10/2017  . CYSTOSCOPY WITH BIOPSY N/A 11/23/2014   Procedure: CYSTOSCOPY WITH BLADDER  BIOPSY AND FULGERATION;  Surgeon: Bjorn Pippin, MD;  Location[facility];  Service: Urology;  Laterality: N/A;  . KNEE ARTHROSCOPY Right 2008  . LIVER SURGERY    . MULTIPLE EXTRACTIONS WITH ALVEOLOPLASTY N/A 03/24/2018   Procedure: EXTRACTIONS X 9;  Surgeon:  Ocie Doyne, DDS;  Location: [facility];  Service: Oral Surgery;  Laterality: N/A;  . SMALL INTESTINE SURGERY    . WISDOM TOOTH EXTRACTION      Social History:  reports that she has never smoked. She has never used smokeless tobacco. She reports that she does not drink alcohol and does not use drugs.  Allergies:  Allergies  Allergen Reactions  . Penicillins Anaphylaxis  . Buprenorphine Hcl Itching  . Morphine And Related Itching  . Hydrocodone-Acetaminophen Rash    Medications Prior to Admission  Medication Sig Dispense Refill  . AMBULATORY NON FORMULARY MEDICATION Iron Infusions every 3 months done at [facility]- ordered thru [facility]    . cetirizine (ZYRTEC) 5 MG tablet Take 5 mg by mouth daily.    . cevimeline (EVOXAC) 30 MG capsule Take 1 capsule by mouth 3 (three) times daily as needed.     . cyclobenzaprine (FLEXERIL) 10 MG tablet Take 1 tablet (10 mg total) by mouth 2 (two) times daily as needed for muscle spasms. 14 tablet 0  . diazepam (DIASTAT ACUDIAL) 10 MG GEL Place 10 mg rectally once.     . diphenoxylate-atropine (LOMOTIL) 2.5-0.025 MG tablet One tablet twice a day (Patient taking differently: Take 1 tablet by mouth 2 (two) times daily as needed for diarrhea or loose stools. ) 60 tablet 0  . eletriptan (RELPAX) 40 MG tablet Take 1 tablet (40 mg total) by mouth as needed for migraine or headache. May repeat in 2 hours if headache persists or recurs. 12 tablet 11  . EPINEPHrine 0.3 mg/0.3 mL IJ SOAJ injection Inject 0.3 mg into the muscle as needed for anaphylaxis.     Dorise Hiss (AIMOVIG) 70 MG/ML SOAJ Inject 70 mg into the skin at bedtime. 1 pen 11  . hydroxychloroquine (PLAQUENIL) 200 MG tablet Take 400 mg by mouth every morning.     . hydroxypropyl methylcellulose / hypromellose (ISOPTO TEARS / GONIOVISC) 2.5 % ophthalmic solution Place 2 drops into both eyes 3 (three) times daily as needed for dry eyes.     Marland Kitchen levothyroxine (SYNTHROID) 175  MCG tablet Take 175 mcg by mouth daily before breakfast.    . omalizumab Geoffry Paradise) 150 MG/ML prefilled syringe Inject 150 mg into the skin every 28 (twenty-eight) days.     . OMEPRAZOLE PO Take 20 mg by mouth at bedtime.     . ondansetron (ZOFRAN-ODT) 8 MG disintegrating tablet Take 1 tablet (8 mg total) by mouth every 8 (eight) hours as needed. 20 tablet 6  . predniSONE (DELTASONE) 5 MG tablet Take 1 tablet by mouth daily.    . propranolol ER (INDERAL LA) 80 MG 24 hr capsule Take 1 capsule (80 mg total) by mouth at bedtime. 30 capsule 11  . QUEtiapine (SEROQUEL) 300 MG tablet Take 300 mg by mouth at bedtime.    Marland Kitchen  SUMAtriptan (IMITREX) 6 MG/0.5ML SOLN injection Inject 0.5 mLs (6 mg total) into the skin every 2 (two) hours as needed for migraine or headache. May repeat in 2 hours if headache persists or recurs. 6 mL 5  . tiZANidine (ZANAFLEX) 4 MG tablet Take 1 tablet (4 mg total) by mouth every 8 (eight) hours as needed for muscle spasms. 30 tablet 3  . White Petrolatum-Mineral Oil (WH PETROL-MINERAL OIL-LANOLIN) 0.1-0.1 % OINT Place 1 application into both eyes at bedtime.    . ciprofloxacin (CIPRO) 500 MG tablet Take 1 tablet (500 mg total) by mouth every 12 (twelve) hours. (Patient not taking: Reported on 04/13/2020) 14 tablet 0  . eletriptan (RELPAX) 40 MG tablet Take 1 tablet (40 mg total) by mouth as needed for migraine or headache. May repeat in 2 hours if headache persists or recurs. (Patient not taking: Reported on 04/13/2020) 12 tablet 6  . levothyroxine (SYNTHROID) 100 MCG tablet Take 175 mcg by mouth daily.       Blood pressure 102/83, pulse (!) 49, temperature 98 F (36.7 C), temperature source Oral, resp. rate 20, height 5' (1.524 m), weight 45.9 kg, SpO2 100 %. Physical Exam: Constitutional: NAD; conversant; no deformities Eyes: Moist conjunctiva; no lid lag; anicteric; PERRL Neck: Trachea midline; no thyromegaly Lungs: Normal respiratory effort; no tactile fremitus CV: RRR; no  palpable thrills; no pitting edema GI: Abd soft, mild distention, TTP epigastrium with guarding, RLQ urostomy in place pink and viable with clear urine in pouch, no palpable parastomal hernia, no peritonitis MSK: Normal gait; no clubbing/cyanosis Psychiatric: Appropriate affect; alert and oriented x3 Lymphatic: No palpable cervical or axillary lymphadenopathy  Results for orders placed or performed during the hospital encounter of 04/13/20 (from the past 48 hour(s))  Urinalysis, Routine w reflex microscopic     Status: Abnormal   Collection Time: 04/13/20  6:17 PM  Result Value Ref Range   Color, Urine AMBER (A) YELLOW    Comment: BIOCHEMICALS MAY BE AFFECTED BY COLOR   APPearance CLOUDY (A) CLEAR   Specific Gravity, Urine 1.013 1.005 - 1.030   pH 9.0 (H) 5.0 - 8.0   Glucose, UA NEGATIVE NEGATIVE mg/dL   Hgb urine dipstick NEGATIVE NEGATIVE   Bilirubin Urine NEGATIVE NEGATIVE   Ketones, ur NEGATIVE NEGATIVE mg/dL   Protein, ur 010 (A) NEGATIVE mg/dL   Nitrite NEGATIVE NEGATIVE   Leukocytes,Ua SMALL (A) NEGATIVE   RBC / HPF 11-20 0 - 5 RBC/hpf   WBC, UA >50 (H) 0 - 5 WBC/hpf   Bacteria, UA MANY (A) NONE SEEN   Mucus PRESENT    Amorphous Crystal PRESENT     Comment: Performed at [facility], [address], [place], [place] [address]  Lipase, blood     Status: None   Collection Time: 04/13/20  6:31 PM  Result Value Ref Range   Lipase 38 11 - 51 U/L    Comment: Performed at [facility], [address], [place], [place] [address]  Comprehensive metabolic panel     Status: Abnormal   Collection Time: 04/13/20  6:31 PM  Result Value Ref Range   Sodium 143 135 - 145 mmol/L   Potassium 3.3 (L) 3.5 - 5.1 mmol/L   Chloride 107 98 - 111 mmol/L   CO2 23 22 - 32 mmol/L   Glucose, Bld 106 (H) 70 - 99 mg/dL    Comment: Glucose reference range applies only to samples taken after fasting for at least 8 hours.  BUN 11 6 - 20 mg/dL   Creatinine, Ser  1.77 0.44 - 1.00 mg/dL   Calcium 9.9 8.9 - 93.9 mg/dL   Total Protein 8.0 6.5 - 8.1 g/dL   Albumin 4.2 3.5 - 5.0 g/dL   AST 21 15 - 41 U/L   ALT 16 0 - 44 U/L   Alkaline Phosphatase 70 38 - 126 U/L   Total Bilirubin 2.1 (H) 0.3 - 1.2 mg/dL   GFR, Estimated >03 >00 mL/min   Anion gap 13 5 - 15    Comment: Performed at [facility], [address], [place], [place] [address]  CBC     Status: None   Collection Time: 04/13/20  6:31 PM  Result Value Ref Range   WBC 5.0 4.0 - 10.5 K/uL   RBC 4.20 3.87 - 5.11 MIL/uL   Hemoglobin 12.2 12.0 - 15.0 g/dL   HCT 07.6 36 - 46 %   MCV 89.8 80.0 - 100.0 fL   MCH 29.0 26.0 - 34.0 pg   MCHC 32.4 30.0 - 36.0 g/dL   RDW 22.6 33.3 - 54.5 %   Platelets 207 150 - 400 K/uL   nRBC 0.0 0.0 - 0.2 %    Comment: Performed at [facility], [address] [place], [place] [address]  I-Stat beta hCG blood, ED     Status: None   Collection Time: 04/13/20  6:40 PM  Result Value Ref Range   I-stat hCG, quantitative <5.0 <5 mIU/mL   Comment 3            Comment:   GEST. AGE      CONC.  (mIU/mL)   <=1 WEEK        5 - 50     2 WEEKS       50 - 500     3 WEEKS       100 - 10,000     4 WEEKS     1,000 - 30,000        FEMALE AND NON-PREGNANT FEMALE:     LESS THAN 5 mIU/mL   Troponin I (High Sensitivity)     Status: None   Collection Time: 04/13/20 10:46 PM  Result Value Ref Range   Troponin I (High Sensitivity) <2 <18 ng/L    Comment: (NOTE) Elevated high sensitivity troponin I (hsTnI) values and significant  changes across serial measurements may suggest ACS but many other  chronic and acute conditions are known to elevate hsTnI results.  Refer to the "Links" section for chest pain algorithms and additional  guidance. Performed at [facility], [address] [place], [place] [address]   Troponin I (High Sensitivity)     Status: None   Collection Time: 04/14/20 12:46 AM  Result Value Ref Range    Troponin I (High Sensitivity) <2 <18 ng/L    Comment: (NOTE) Elevated high sensitivity troponin I (hsTnI) values and significant  changes across serial measurements may suggest ACS but many other  chronic and acute conditions are known to elevate hsTnI results.  Refer to the "Links" section for chest pain algorithms and additional  guidance. Performed at [facility], [address] [place], [place] [address]   Respiratory Panel by RT PCR (Flu A&B, Covid) - Nasopharyngeal Swab     Status: None   Collection Time: 04/14/20 12:46 AM   Specimen: Nasopharyngeal Swab  Result Value Ref Range   SARS Coronavirus 2 by RT PCR  NEGATIVE NEGATIVE    Comment: (NOTE) SARS-CoV-2 target nucleic acids are NOT DETECTED.  The SARS-CoV-2 RNA is generally detectable in upper respiratoy specimens during the acute phase of infection. The lowest concentration of SARS-CoV-2 viral copies this assay can detect is 131 copies/mL. A negative result does not preclude SARS-Cov-2 infection and should not be used as the sole basis for treatment or other patient management decisions. A negative result may occur with  improper specimen collection/handling, submission of specimen other than nasopharyngeal swab, presence of viral mutation(s) within the areas targeted by this assay, and inadequate number of viral copies (<131 copies/mL). A negative result must be combined with clinical observations, patient history, and epidemiological information. The expected result is Negative.  Fact Sheet for Patients:  https://www.moore.com/  Fact Sheet for Healthcare Providers:  https://www.young.biz/  This test is no t yet approved or cleared by the [place] FDA and  has been authorized for detection and/or diagnosis of SARS-CoV-2 by FDA under an Emergency Use Authorization (EUA). This EUA will remain  in effect (meaning this test can be used) for the duration of  the COVID-19 declaration under Section 564(b)(1) of the Act, 21 U.S.C. section 360bbb-3(b)(1), unless the authorization is terminated or revoked sooner.     Influenza A by PCR NEGATIVE NEGATIVE   Influenza B by PCR NEGATIVE NEGATIVE    Comment: (NOTE) The Xpert Xpress SARS-CoV-2/FLU/RSV assay is intended as an aid in  the diagnosis of influenza from Nasopharyngeal swab specimens and  should not be used as a sole basis for treatment. Nasal washings and  aspirates are unacceptable for Xpert Xpress SARS-CoV-2/FLU/RSV  testing.  Fact Sheet for Patients: https://www.moore.com/  Fact Sheet for Healthcare Providers: https://www.young.biz/  This test is not yet approved or cleared by the [place] FDA and  has been authorized for detection and/or diagnosis of SARS-CoV-2 by  FDA under an Emergency Use Authorization (EUA). This EUA will remain  in effect (meaning this test can be used) for the duration of the  Covid-19 declaration under Section 564(b)(1) of the Act, 21  U.S.C. section 360bbb-3(b)(1), unless the authorization is  terminated or revoked. Performed at [facility], [address] [place], [place] [address]   CBC WITH DIFFERENTIAL     Status: Abnormal   Collection Time: 04/14/20  5:57 AM  Result Value Ref Range   WBC 4.8 4.0 - 10.5 K/uL   RBC 3.70 (L) 3.87 - 5.11 MIL/uL   Hemoglobin 10.7 (L) 12.0 - 15.0 g/dL   HCT 25.9 (L) 36 - 46 %   MCV 89.2 80.0 - 100.0 fL   MCH 28.9 26.0 - 34.0 pg   MCHC 32.4 30.0 - 36.0 g/dL   RDW 56.3 87.5 - 64.3 %   Platelets 152 150 - 400 K/uL   nRBC 0.0 0.0 - 0.2 %   Neutrophils Relative % 71 %   Neutro Abs 3.4 1.7 - 7.7 K/uL   Lymphocytes Relative 23 %   Lymphs Abs 1.1 0.7 - 4.0 K/uL   Monocytes Relative 5 %   Monocytes Absolute 0.3 0.1 - 1.0 K/uL   Eosinophils Relative 1 %   Eosinophils Absolute 0.1 0.0 - 0.5 K/uL   Basophils Relative 0 %   Basophils Absolute 0.0 0.0 - 0.1  K/uL   Immature Granulocytes 0 %   Abs Immature Granulocytes 0.01 0.00 - 0.07 K/uL    Comment: Performed at [facility], [address] [place], [place] [address]  Comprehensive metabolic  panel     Status: Abnormal   Collection Time: 04/14/20  5:57 AM  Result Value Ref Range   Sodium 137 135 - 145 mmol/L   Potassium 3.2 (L) 3.5 - 5.1 mmol/L   Chloride 108 98 - 111 mmol/L   CO2 21 (L) 22 - 32 mmol/L   Glucose, Bld 126 (H) 70 - 99 mg/dL    Comment: Glucose reference range applies only to samples taken after fasting for at least 8 hours.   BUN 9 6 - 20 mg/dL   Creatinine, Ser 0.980.71 0.44 - 1.00 mg/dL   Calcium 8.8 (L) 8.9 - 10.3 mg/dL   Total Protein 6.3 (L) 6.5 - 8.1 g/dL   Albumin 3.4 (L) 3.5 - 5.0 g/dL   AST 21 15 - 41 U/L   ALT 16 0 - 44 U/L   Alkaline Phosphatase 61 38 - 126 U/L   Total Bilirubin 1.9 (H) 0.3 - 1.2 mg/dL   GFR, Estimated >11>60 >91>60 mL/min   Anion gap 8 5 - 15    Comment: Performed at [facility], [address] [place], [place] [address]  Magnesium     Status: None   Collection Time: 04/14/20  5:57 AM  Result Value Ref Range   Magnesium 1.7 1.7 - 2.4 mg/dL    Comment: Performed at [facility], [address] [place], [place] [address]   CT Abdomen Pelvis W Contrast  Result Date: 04/14/2020 CLINICAL DATA:  29 year old female with abdominal pain. EXAM: CT ABDOMEN AND PELVIS WITH CONTRAST TECHNIQUE: Multidetector CT imaging of the abdomen and pelvis was performed using the standard protocol following bolus administration of intravenous contrast. CONTRAST:  75mL OMNIPAQUE IOHEXOL 300 MG/ML  SOLN COMPARISON:  CT abdomen pelvis dated 07/06/2019. FINDINGS: Lower chest: The visualized lung bases are clear. No intra-abdominal free air or free fluid. Hepatobiliary: No focal liver abnormality is seen. No gallstones, gallbladder wall thickening, or biliary dilatation. Pancreas: Unremarkable. No pancreatic ductal  dilatation or surrounding inflammatory changes. Spleen: Normal in size without focal abnormality. Adrenals/Urinary Tract: The adrenal glands unremarkable. Subcentimeter right renal inferior pole hypodense focus is not characterized. There is no hydronephrosis on either side. Status post cystectomy with right lower quadrant urostomy. Stomach/Bowel: Postsurgical changes of bowel with anastomotic sutures. Evaluation of the bowel is limited in the absence of oral contrast. Multiple dilated loops of small bowel throughout the abdomen measuring up to 3.6 cm in caliber. A transition may be present in the right lower quadrant (coronal 38/4 and axial 56/2). CT with oral contrast may provide better evaluation. There is loose stool and air within the colon. Vascular/Lymphatic: The abdominal aorta and IVC unremarkable. No portal venous gas. There is no adenopathy. Reproductive: The uterus and ovaries are grossly unremarkable. No pelvic mass. Other: Anterior abdominal wall surgical scar. Musculoskeletal: No acute or significant osseous findings. Sacral stimulator noted. IMPRESSION: 1. Dilated loops of small bowel may represent ileus but concerning for small-bowel obstruction with possible transition in the right lower quadrant. CT with oral contrast may provide better evaluation. 2. Status post cystectomy with right lower quadrant urostomy. Electronically Signed   By: Elgie CollardArash  Radparvar M.D.   On: 04/14/2020 00:05   DG Chest Portable 1 View  Result Date: 04/13/2020 CLINICAL DATA:  Chest pain for several hours EXAM: PORTABLE CHEST 1 VIEW COMPARISON:  11/26/2017 FINDINGS: Cardiac shadow is within normal limits. The lungs are well aerated bilaterally. No focal infiltrate or sizable effusion is seen. No bony abnormality is  noted. IMPRESSION: No acute abnormality seen. Electronically Signed   By: Alcide Clever M.D.   On: 04/13/2020 23:06    Assessment/Plan Lupus - on plaquenil, prednisone Graves disease s/p ablation on  synthroid PMH interstitial cystitis s/p cystectomy with ileal conduit 11/22/2017 PMH necrotizing enterocolitis as an infant with multiple abdominal surgeries  SBO, likely related to adhesive disease - afebrile, VSS, WBC 4.8 - recommend NG tube placement, small bowel protocol with gastrografin - NPO, IVF  - no acute surgical needs, CCS will follow, hopefully obstruction with resolve with non-operative management.  Adam Phenix, PA-C [facility] Please see Amion for pager number during day hours 7:00am-4:30pm 04/14/2020, 8:49 AM

## 2020-04-14 NOTE — H&P (Addendum)
History and Physical    Jamie ChadKateria B Colvard ZOX:[id number] DOB: Apr 12, 1991 DOA: 04/13/2020  PCP: Roger KillWilliams, Breejante J, PA-C  Patient coming from: Home.  Chief Complaint: Abdominal pain.  HPI: [name] is a 29 y.o. 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  Has had a bowel movement about 8 hours prior to coming to the ER.  No vomiting or diarrhea denies any fever chills.  Pain is persistent.  ED Course: In the ER patient was afebrile and CT abdomen pelvis done without contrast shows features concerning for possible ileus versus developing bowel obstruction.  Patient has not had any vomiting in the ER.  Pain improved with pain relief medications.  Covid test was negative labs are significant for mild hypokalemia I sensitive troponins were negative EKG shows normal sinus rhythm.  UA shows features concerning for UTI.  Review of Systems: As per HPI, rest all negative.   Past Medical History:  Diagnosis Date  . Anemia   . Anxiety   . Arthritis   . Bilateral hearing loss   . Bladder mass   . Chronic interstitial cystitis   . Chronic interstitial cystitis with hematuria    2016  . Chronic sinusitis   . Fibromyalgia   . GERD (gastroesophageal reflux disease)   . Gross hematuria   . History of Graves' disease    tx done in 2007  . History of migraine   . History of recurrent UTIs   . Hypothyroidism   . Iron deficiency anemia    due to SGS  . Lower urinary tract symptoms (LUTS)   . Lupus [facility](HCC)    rheumologist--  dr Janalyn Rouseshaili devashwer  . Malabsorption syndrome   . Migraine   . Migraine   . Passage of  loose stools    chronic --  due to short gut syndrome  . Pleuritis   . S/P radioactive iodine thyroid ablation    2007  . SGS (short gut syndrome)   . Short gut syndrome   . Sjogren's disease (HCC)   . Vitiligo   . Wears glasses     Past Surgical History:  Procedure Laterality Date  . BOWEL RESECTION  newborn   necrotizing enterocolitis (liver patched)  . BRONCHOSCOPY  2014  . CESAREAN SECTION  01/02/2012   Procedure: CESAREAN SECTION;  Surgeon: Lenoard Adenichard J Taavon, MD;  Location: [facility];  Service: Gynecology;  Laterality: N/A;  . COLONOSCOPY  07-29-2013  . CYSTECTOMY W/ URETEROILEAL CONDUIT  10/2017  . CYSTOSCOPY WITH BIOPSY N/A 11/23/2014   Procedure: CYSTOSCOPY WITH BLADDER  BIOPSY AND FULGERATION;  Surgeon: Bjorn PippinJohn Wrenn, MD;  [facility];  Service: Urology;  Laterality: N/A;  . KNEE ARTHROSCOPY Right 2008  . LIVER SURGERY    . MULTIPLE EXTRACTIONS WITH ALVEOLOPLASTY N/A 03/24/2018   Procedure: EXTRACTIONS X 9;  Surgeon: Ocie DoyneJensen, Scott, DDS;  Location: [facility];  Service: Oral Surgery;  Laterality: N/A;  . SMALL INTESTINE SURGERY    . WISDOM TOOTH EXTRACTION       reports that she has never smoked. She has never used smokeless tobacco. She reports that she does not drink alcohol and does  not use drugs.  Allergies  Allergen Reactions  . Penicillins Anaphylaxis  . Buprenorphine Hcl Itching  . Morphine And Related Itching  . Hydrocodone-Acetaminophen Rash    Family History  Problem Relation Age of Onset  . Diabetes Father   . Endometriosis Mother   . Fibromyalgia Mother   . Heart disease Maternal Grandmother   . Hypertension Maternal Grandmother   . Osteoporosis Maternal Grandmother   . Heart disease Maternal Grandfather   . Hypertension Maternal Grandfather   . Prostate cancer Maternal Grandfather   . Heart disease Paternal Grandmother   . Other Neg Hx   . Colon cancer Neg Hx   . Pancreatic cancer Neg Hx   . Stomach cancer Neg  Hx     Prior to Admission medications   Medication Sig Start Date End Date Taking? Authorizing Provider  AMBULATORY NON FORMULARY MEDICATION Iron Infusions every 3 months done at [facility]- ordered thru [facility]   Yes [provider]  cetirizine (ZYRTEC) 5 MG tablet Take 5 mg by mouth daily.   Yes [provider]  cevimeline (EVOXAC) 30 MG capsule Take 1 capsule by mouth 3 (three) times daily as needed.  04/28/19  Yes [provider]  cyclobenzaprine (FLEXERIL) 10 MG tablet Take 1 tablet (10 mg total) by mouth 2 (two) times daily as needed for muscle spasms. 08/10/17  Yes Roxan Hockey, [place] N, PA-C  diazepam (DIASTAT ACUDIAL) 10 MG GEL Place 10 mg rectally once.    Yes [provider]  diphenoxylate-atropine (LOMOTIL) 2.5-0.025 MG tablet One tablet twice a day Patient taking differently: Take 1 tablet by mouth 2 (two) times daily as needed for diarrhea or loose stools.  03/08/17  Yes Meredith Pel, NP  eletriptan (RELPAX) 40 MG tablet Take 1 tablet (40 mg total) by mouth as needed for migraine or headache. May repeat in 2 hours if headache persists or recurs. 04/07/19  Yes Levert Feinstein, MD  EPINEPHrine 0.3 mg/0.3 mL IJ SOAJ injection Inject 0.3 mg into the muscle as needed for anaphylaxis.  03/31/19  Yes [provider]  Erenumab-aooe (AIMOVIG) 70 MG/ML SOAJ Inject 70 mg into the skin at bedtime. 11/09/19  Yes Levert Feinstein, MD  hydroxychloroquine (PLAQUENIL) 200 MG tablet Take 400 mg by mouth every morning.    Yes [provider]  hydroxypropyl methylcellulose / hypromellose (ISOPTO TEARS / GONIOVISC) 2.5 % ophthalmic solution Place 2 drops into both eyes 3 (three) times daily as needed for dry eyes.  04/27/19  Yes [provider]  levothyroxine (SYNTHROID) 175 MCG tablet Take 175 mcg by mouth daily before breakfast.   Yes [provider]  omalizumab Geoffry Paradise) 150 MG/ML prefilled syringe Inject 150 mg into the skin every 28  (twenty-eight) days.    Yes [provider]  OMEPRAZOLE PO Take 20 mg by mouth at bedtime.    Yes [provider]  ondansetron (ZOFRAN-ODT) 8 MG disintegrating tablet Take 1 tablet (8 mg total) by mouth every 8 (eight) hours as needed. 11/09/19  Yes Levert Feinstein, MD  predniSONE (DELTASONE) 5 MG tablet Take 1 tablet by mouth daily. 04/27/19  Yes [provider]  propranolol ER (INDERAL LA) 80 MG 24 hr capsule Take 1 capsule (80 mg total) by mouth at bedtime. 02/09/20  Yes Glean Salvo, NP  QUEtiapine (SEROQUEL) 300 MG tablet Take 300 mg by mouth at bedtime.   Yes [provider]  SUMAtriptan (IMITREX) 6 MG/0.5ML SOLN injection Inject 0.5 mLs (6 mg  total) into the skin every 2 (two) hours as needed for migraine or headache. May repeat in 2 hours if headache persists or recurs. 02/09/20  Yes Glean Salvo, NP  tiZANidine (ZANAFLEX) 4 MG tablet Take 1 tablet (4 mg total) by mouth every 8 (eight) hours as needed for muscle spasms. 11/09/19  Yes Levert Feinstein, MD  White Petrolatum-Mineral Oil [facility] PETROL-MINERAL OIL-LANOLIN) 0.1-0.1 % OINT Place 1 application into both eyes at bedtime. 04/28/19  Yes [provider]  ciprofloxacin (CIPRO) 500 MG tablet Take 1 tablet (500 mg total) by mouth every 12 (twelve) hours. Patient not taking: Reported on 04/13/2020 07/06/19   Horton, Mayer Masker, MD  eletriptan (RELPAX) 40 MG tablet Take 1 tablet (40 mg total) by mouth as needed for migraine or headache. May repeat in 2 hours if headache persists or recurs. Patient not taking: Reported on 04/13/2020 11/09/19   Levert Feinstein, MD  levothyroxine (SYNTHROID) 100 MCG tablet Take 175 mcg by mouth daily.  02/27/17   [provider]    Physical Exam: Constitutional: Moderately built and nourished. Vitals:   04/14/20 0030 04/14/20 0100 04/14/20 0200 04/14/20 0216  BP: 106/81 115/81 101/77 120/83  Pulse: 60 (!) 57 60 (!) 49  Resp:  Temp:   98.2 F (36.8 C) 98 F (36.7  C)  TempSrc:   Oral Oral  SpO2: 97% 100% 98% 100%  Weight:    45.9 kg  Height:    5' (1.524 m)   Eyes: Anicteric no pallor. ENMT: No discharge from the ears eyes nose or mouth. Neck: No mass felt.  No neck rigidity. Respiratory: No rhonchi or crepitations. Cardiovascular: S1-S2 heard. Abdomen: Abdomen appears mildly tender to with no guarding or rebound tenderness.  Urostomy bag seen. Musculoskeletal: No edema. Skin: No rash. Neurologic: Alert awake oriented to time place and person.  Moves all extremities. Psychiatric: Appears normal.   Labs on Admission: I have personally reviewed following labs and imaging studies  CBC: Recent Labs  Lab 04/13/20 1831  WBC 5.0  HGB 12.2  HCT 37.7  MCV 89.8  PLT 207   Basic Metabolic Panel: Recent Labs  Lab 04/13/20 1831  NA 143  K 3.3*  CL 107  CO2 23  GLUCOSE 106*  BUN 11  CREATININE 0.68  CALCIUM 9.9   GFR: Estimated Creatinine Clearance: 73.2 mL/min (by C-G formula based on SCr of 0.68 mg/dL). Liver Function Tests: Recent Labs  Lab 04/13/20 1831  AST 21  ALT 16  ALKPHOS 70  BILITOT 2.1*  PROT 8.0  ALBUMIN 4.2   Recent Labs  Lab 04/13/20 1831  LIPASE 38   No results for input(s): AMMONIA in the last 168 hours. Coagulation Profile: No results for input(s): INR, PROTIME in the last 168 hours. Cardiac Enzymes: No results for input(s): CKTOTAL, CKMB, CKMBINDEX, TROPONINI in the last 168 hours. BNP (last 3 results) No results for input(s): PROBNP in the last 8760 hours. HbA1C: No results for input(s): HGBA1C in the last 72 hours. CBG: No results for input(s): GLUCAP in the last 168 hours. Lipid Profile: No results for input(s): CHOL, HDL, LDLCALC, TRIG, CHOLHDL, LDLDIRECT in the last 72 hours. Thyroid Function Tests: No results for input(s): TSH, T4TOTAL, FREET4, T3FREE, THYROIDAB in the last 72 hours. Anemia Panel: No results for input(s): VITAMINB12, FOLATE, FERRITIN, TIBC, IRON, RETICCTPCT in the last 72  hours. Urine analysis:    Component Value Date/Time   COLORURINE AMBER (A) 04/13/2020 1817   APPEARANCEUR  CLOUDY (A) 04/13/2020 1817   LABSPEC 1.013 04/13/2020 1817   PHURINE 9.0 (H) 04/13/2020 1817   GLUCOSEU NEGATIVE 04/13/2020 1817   HGBUR NEGATIVE 04/13/2020 1817   BILIRUBINUR NEGATIVE 04/13/2020 1817   BILIRUBINUR neg 01/17/2015 1559   KETONESUR NEGATIVE 04/13/2020 1817   PROTEINUR 100 (A) 04/13/2020 1817   UROBILINOGEN negative 01/17/2015 1559   UROBILINOGEN 1.0 09/29/2014 1136   NITRITE NEGATIVE 04/13/2020 1817   LEUKOCYTESUR SMALL (A) 04/13/2020 1817   Sepsis Labs: @LABRCNTIP (procalcitonin:4,lacticidven:4) ) Recent Results (from the past 240 hour(s))  Novel Coronavirus, NAA ([facility])     Status: None   Collection Time: 04/04/20  2:58 PM   Specimen: Nasopharyngeal(NP) swabs in vial transport medium   Nasopharynge  Screenin  Result Value Ref Range Status   SARS-CoV-2, NAA Not Detected Not Detected Final    Comment: This nucleic acid amplification test was developed and its performance characteristics determined by 06/04/20. Nucleic acid amplification tests include RT-PCR and TMA. This test has not been FDA cleared or approved. This test has been authorized by FDA under an Emergency Use Authorization (EUA). This test is only authorized for the duration of time the declaration that circumstances exist justifying the authorization of the emergency use of in vitro diagnostic tests for detection of SARS-CoV-2 virus and/or diagnosis of COVID-19 infection under section 564(b)(1) of the Act, 21 U.S.C. [employer]) (1), unless the authorization is terminated or revoked sooner. When diagnostic testing is negative, the possibility of a false negative result should be considered in the context of a patient's recent exposures and the presence of clinical signs and symptoms consistent with COVID-19. An individual without symptoms of COVID-19 and who is not shedding  SARS-CoV-2 virus wo uld expect to have a negative (not detected) result in this assay.   SARS-COV-2, NAA 2 DAY TAT     Status: None   Collection Time: 04/04/20  2:58 PM   Nasopharynge  Screenin  Result Value Ref Range Status   SARS-CoV-2, NAA 2 DAY TAT Performed  Final  Respiratory Panel by RT PCR (Flu A&B, Covid) - Nasopharyngeal Swab     Status: None   Collection Time: 04/14/20 12:46 AM   Specimen: Nasopharyngeal Swab  Result Value Ref Range Status   SARS Coronavirus 2 by RT PCR NEGATIVE NEGATIVE Final    Comment: (NOTE) SARS-CoV-2 target nucleic acids are NOT DETECTED.  The SARS-CoV-2 RNA is generally detectable in upper respiratoy specimens during the acute phase of infection. The lowest concentration of SARS-CoV-2 viral copies this assay can detect is 131 copies/mL. A negative result does not preclude SARS-Cov-2 infection and should not be used as the sole basis for treatment or other patient management decisions. A negative result may occur with  improper specimen collection/handling, submission of specimen other than nasopharyngeal swab, presence of viral mutation(s) within the areas targeted by this assay, and inadequate number of viral copies (<131 copies/mL). A negative result must be combined with clinical observations, patient history, and epidemiological information. The expected result is Negative.  Fact Sheet for Patients:  04/16/20  Fact Sheet for Healthcare Providers:  https://www.moore.com/  This test is no t yet approved or cleared by the https://www.young.biz/ FDA and  has been authorized for detection and/or diagnosis of SARS-CoV-2 by FDA under an Emergency Use Authorization (EUA). This EUA will remain  in effect (meaning this test can be used) for the duration of the COVID-19 declaration under Section 564(b)(1) of the Act, 21 U.S.C. section 360bbb-3(b)(1), unless the authorization is terminated  or revoked  sooner.     Influenza A by PCR NEGATIVE NEGATIVE Final   Influenza B by PCR NEGATIVE NEGATIVE Final    Comment: (NOTE) The Xpert Xpress SARS-CoV-2/FLU/RSV assay is intended as an aid in  the diagnosis of influenza from Nasopharyngeal swab specimens and  should not be used as a sole basis for treatment. Nasal washings and  aspirates are unacceptable for Xpert Xpress SARS-CoV-2/FLU/RSV  testing.  Fact Sheet for Patients: https://www.moore.com/  Fact Sheet for Healthcare Providers: https://www.young.biz/  This test is not yet approved or cleared by the [place] FDA and  has been authorized for detection and/or diagnosis of SARS-CoV-2 by  FDA under an Emergency Use Authorization (EUA). This EUA will remain  in effect (meaning this test can be used) for the duration of the  Covid-19 declaration under Section 564(b)(1) of the Act, 21  U.S.C. section 360bbb-3(b)(1), unless the authorization is  terminated or revoked. Performed at [facility], [address] [place], [place] [address]      Radiological Exams on Admission: CT Abdomen Pelvis W Contrast  Result Date: 04/14/2020 CLINICAL DATA:  29 year old female with abdominal pain. EXAM: CT ABDOMEN AND PELVIS WITH CONTRAST TECHNIQUE: Multidetector CT imaging of the abdomen and pelvis was performed using the standard protocol following bolus administration of intravenous contrast. CONTRAST:  78mL OMNIPAQUE IOHEXOL 300 MG/ML  SOLN COMPARISON:  CT abdomen pelvis dated 07/06/2019. FINDINGS: Lower chest: The visualized lung bases are clear. No intra-abdominal free air or free fluid. Hepatobiliary: No focal liver abnormality is seen. No gallstones, gallbladder wall thickening, or biliary dilatation. Pancreas: Unremarkable. No pancreatic ductal dilatation or surrounding inflammatory changes. Spleen: Normal in size without focal abnormality. Adrenals/Urinary Tract: The adrenal glands  unremarkable. Subcentimeter right renal inferior pole hypodense focus is not characterized. There is no hydronephrosis on either side. Status post cystectomy with right lower quadrant urostomy. Stomach/Bowel: Postsurgical changes of bowel with anastomotic sutures. Evaluation of the bowel is limited in the absence of oral contrast. Multiple dilated loops of small bowel throughout the abdomen measuring up to 3.6 cm in caliber. A transition may be present in the right lower quadrant (coronal 38/4 and axial 56/2). CT with oral contrast may provide better evaluation. There is loose stool and air within the colon. Vascular/Lymphatic: The abdominal aorta and IVC unremarkable. No portal venous gas. There is no adenopathy. Reproductive: The uterus and ovaries are grossly unremarkable. No pelvic mass. Other: Anterior abdominal wall surgical scar. Musculoskeletal: No acute or significant osseous findings. Sacral stimulator noted. IMPRESSION: 1. Dilated loops of small bowel may represent ileus but concerning for small-bowel obstruction with possible transition in the right lower quadrant. CT with oral contrast may provide better evaluation. 2. Status post cystectomy with right lower quadrant urostomy. Electronically Signed   By: Elgie Collard M.D.   On: 04/14/2020 00:05   DG Chest Portable 1 View  Result Date: 04/13/2020 CLINICAL DATA:  Chest pain for several hours EXAM: PORTABLE CHEST 1 VIEW COMPARISON:  11/26/2017 FINDINGS: Cardiac shadow is within normal limits. The lungs are well aerated bilaterally. No focal infiltrate or sizable effusion is seen. No bony abnormality is noted. IMPRESSION: No acute abnormality seen. Electronically Signed   By: Alcide Clever M.D.   On: 04/13/2020 23:06    EKG: Independently reviewed.  Normal sinus rhythm.  Assessment/Plan Principal Problem:   Abdominal pain Active Problems:   Chronic interstitial cystitis with hematuria    1. Abdominal pain concerning for possible ileus  versus small  bowel obstruction.  We will keep patient n.p.o. IV fluids pain really medication get KUB acute surgical consult. 2. Possible UTI on antibiotics. 3. History of migraines takes propranolol as prophylaxis.  We will keep patient on IV metoprolol to make sure that patient did not going into beta-blocker withdrawal.  Once patient can take orally discontinue IV metoprolol. 4. History of Graves' disease status post ablation on Synthroid which we will dose as IV until patient can take oral. 5. History of lupus next Plaquenil presently n.p.o. 6. History of Sjogren's syndrome. 7. History of chronic interstitial cystitis status post cystectomy and urostomy. 8. History of bowel resection for necrotizing enterocolitis as an infant and was on TPN for until patient was age 68.  Since patient has features concerning for possible small bowel obstruction versus ileus will need close monitoring for any further worsening in inpatient status.   DVT prophylaxis: SCDs for now. Code Status: Full code. Family Communication: Discussed with patient. Disposition Plan: Home. Consults called: None. Admission status: Inpatient.   Eduard Clos MD [facility] Pager [phone].  If 7PM-7AM, please contact night-coverage [email or web address] Password [email or web address]  04/14/2020, 4:07 AM

## 2020-04-14 NOTE — Progress Notes (Signed)
Pharmacy Antibiotic Note  [name] is a 29 y.o. female admitted on 04/13/2020 with abdominal pain concerning for possible ileus vs SBO.  Pharmacy has been consulted for Cipro dosing for UTI.  Plan: Cipro 400mg  IV q12h Need for further dosage adjustment appears unlikely at present.    Will sign off at this time.  Please reconsult if a change in clinical status warrants re-evaluation of dosage.    Height: 5' (152.4 cm) Weight: 45.9 kg (101 lb 3.1 oz) IBW/kg (Calculated) : 45.5  Temp (24hrs), Avg:98.2 F (36.8 C), Min:98 F (36.7 C), Max:98.3 F (36.8 C)  Recent Labs  Lab 04/13/20 1831  WBC 5.0  CREATININE 0.68    Estimated Creatinine Clearance: 73.2 mL/min (by C-G formula based on SCr of 0.68 mg/dL).    Allergies  Allergen Reactions  . Penicillins Anaphylaxis  . Buprenorphine Hcl Itching  . Morphine And Related Itching  . Hydrocodone-Acetaminophen Rash   Thank you for allowing pharmacy to be a part of this patient's care.  04/15/20, PharmD 04/14/2020 4:26 AM

## 2020-04-14 NOTE — Progress Notes (Signed)
PROGRESS NOTE    [name]  BJY:[id number] DOB: [birth year] DOA: 04/13/2020 PCP: [name]   Chief Complaint  Patient presents with  . Abdominal Pain    Brief Narrative:  HPI per [name]  [name] is a 29 y.o. 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  Has had a bowel movement about 8 hours prior to coming to the ER.  No vomiting or diarrhea denies any fever chills.  Pain is persistent.  ED Course: In the ER patient was afebrile and CT abdomen pelvis done without contrast shows features concerning for possible ileus versus developing bowel obstruction.  Patient has not had any vomiting in the ER.  Pain improved with pain relief medications.  Covid test was negative labs are significant for mild hypokalemia I sensitive troponins were negative EKG shows normal sinus rhythm.  UA shows features concerning for UTI.  Assessment & Plan:   Principal Problem:   SBO (small bowel obstruction) (HCC) Active Problems:   Abdominal pain   H/O Graves' disease   Sjogren's disease (HCC)   Migraine with aura   Chronic interstitial cystitis with hematuria   Hypokalemia   Hypomagnesemia   Acute lower UTI  1 abdominal pain secondary to small bowel obstruction. Patient presented with epigastric abdominal pain and left-sided periumbilical abdominal pain.  Patient with prior history of and concerning for adhesions as the etiology of probable small bowel obstruction.  Patient with nausea.  No emesis.  Currently refusing NG tube placement as NG tube placement was tried 3 separate occasions on  admission with no success.  Continue bowel rest.  IV fluids.  Replete electrolytes.  Keep potassium > 4, magnesium > 2.  Patient seen in consultation by general surgery who reattempted NG tube placement at bedside but was unsuccessful and have ordered placement under fluoroscopy.  General surgery recommending SBO protocol after NG tube placement.  Per general surgery.  2.  Probable UTI Urine cultures pending.  Continue IV Rocephin.  3.  History of migraines Patient noted to be on propranolol for prophylaxis.  Patient placed on IV metoprolol to prevent beta-blocker withdrawal.  Once tolerating oral intake and resolution of small bowel obstruction will place back on home dose oral beta-blocker.  4.  History of Graves' disease status post ablation Continue IV Synthroid.  5.  History of lupus/Sjogren's syndrome Currently n.p.o.  Continue to hold Plaquenil.  6.  History of chronic interstitial cystitis status post cystectomy and urostomy Urine cultures pending.  On IV Rocephin.  Follow.  7.  History of bowel resection for necrotizing enterocolitis as an infant was on TPN until age 89.  8.  Hypokalemia/hypomagnesemia Magnesium sulfate 4 g IV x1.  KCl 10 mEq IV every 1 hour x4 runs.  Keep magnesium > 2, keep potassium > 4.   DVT prophylaxis: SCDs Code Status: Full Family Communication: Updated patient.  No family at bedside. Disposition:   Status is: Inpatient    Dispo: The patient is from: Home              Anticipated d/c is to: Home  Anticipated d/c date is: 3 to 4 days.              Patient currently with small bowel obstruction, currently n.p.o., general surgery assessment, not stable for discharge.       Consultants:   General surgery: [name] 04/14/2020  Procedures:   CT abdomen and pelvis 04/13/2020  Chest x-ray 04/13/2020    Antimicrobials:   IV ciprofloxacin 04/14/2020 >>>>>   Subjective: Patient sitting up in bed.  Denies any chest pain or  shortness of breath.  States no significant improvement when upper abdominal/epigastric pain.  Denies any flatus.  No bowel movement.  Some nausea.  No emesis.  Stated NG tube was attempted 3 times yesterday with no success.  Hesitant for NG tube to be placed now.  Objective: Vitals:   04/14/20 0200 04/14/20 0216 04/14/20 0508 04/14/20 1342  BP: 101/77 120/83 102/83 103/73  Pulse: 60 (!) 49 (!) 49 80  Resp: 17 20  16   Temp: 98.2 F (36.8 C) 98 F (36.7 C)  98.7 F (37.1 C)  TempSrc: Oral Oral  Oral  SpO2: 98% 100% 100% 98%  Weight:  45.9 kg    Height:  5' (1.524 m)      Intake/Output Summary (Last 24 hours) at 04/14/2020 1441 Last data filed at 04/14/2020 1300 Gross per 24 hour  Intake 229.47 ml  Output 795 ml  Net -565.53 ml   Filed Weights   04/13/20 1815 04/14/20 0216  Weight: 45 kg 45.9 kg    Examination:  General exam: Appears calm and comfortable  Respiratory system: Clear to auscultation. Respiratory effort normal. Cardiovascular system: S1 & S2 heard, RRR. No JVD, murmurs, rubs, gallops or clicks. No pedal edema. Gastrointestinal system: Abdomen is nondistended, soft and tender to palpation in the epigastrium and left periumbilical region.  Positive bowel sounds.  No rebound.  No guarding.  Urostomy intact with clear yellow urine. Central nervous system: Alert and oriented. No focal neurological deficits. Extremities: Symmetric 5 x 5 power. Skin: No rashes, lesions or ulcers Psychiatry: Judgement and insight appear normal. Mood & affect appropriate.     Data Reviewed: I have personally reviewed following labs and imaging studies  CBC: Recent Labs  Lab 04/13/20 1831 04/14/20 0557  WBC 5.0 4.8  NEUTROABS  --  3.4  HGB 12.2 10.7*  HCT 37.7 33.0*  MCV 89.8 89.2  PLT 207 152    Basic Metabolic Panel: Recent Labs  Lab 04/13/20 1831 04/14/20 0557  NA 143 137  K 3.3* 3.2*  CL 107 108  CO2 23 21*  GLUCOSE 106* 126*  BUN 11 9  CREATININE 0.68 0.71    CALCIUM 9.9 8.8*  MG  --  1.7    GFR: Estimated Creatinine Clearance: 73.2 mL/min (by C-G formula based on SCr of 0.71 mg/dL).  Liver Function Tests: Recent Labs  Lab 04/13/20 1831 04/14/20 0557  AST 21 21  ALT 16 16  ALKPHOS 70 61  BILITOT 2.1* 1.9*  PROT 8.0 6.3*  ALBUMIN 4.2 3.4*    CBG: Recent Labs  Lab 04/14/20 1154  GLUCAP 84     Recent Results (from the past 240 hour(s))  Novel Coronavirus, NAA ([facility])     Status: None   Collection Time: 04/04/20  2:58 PM   Specimen: Nasopharyngeal(NP) swabs in vial transport medium   Nasopharynge  Screenin  Result Value Ref Range Status   SARS-CoV-2, NAA Not Detected Not Detected Final    Comment: This nucleic acid  amplification test was developed and its performance characteristics determined by [employer]. Nucleic acid amplification tests include RT-PCR and TMA. This test has not been FDA cleared or approved. This test has been authorized by FDA under an Emergency Use Authorization (EUA). This test is only authorized for the duration of time the declaration that circumstances exist justifying the authorization of the emergency use of in vitro diagnostic tests for detection of SARS-CoV-2 virus and/or diagnosis of COVID-19 infection under section 564(b)(1) of the Act, 21 U.S.C. 801KPV-3(Z) (1), unless the authorization is terminated or revoked sooner. When diagnostic testing is negative, the possibility of a false negative result should be considered in the context of a patient's recent exposures and the presence of clinical signs and symptoms consistent with COVID-19. An individual without symptoms of COVID-19 and who is not shedding SARS-CoV-2 virus wo uld expect to have a negative (not detected) result in this assay.   SARS-COV-2, NAA 2 DAY TAT     Status: None   Collection Time: 04/04/20  2:58 PM   Nasopharynge  Screenin  Result Value Ref Range Status   SARS-CoV-2, NAA 2 DAY TAT Performed  Final   Respiratory Panel by RT PCR (Flu A&B, Covid) - Nasopharyngeal Swab     Status: None   Collection Time: 04/14/20 12:46 AM   Specimen: Nasopharyngeal Swab  Result Value Ref Range Status   SARS Coronavirus 2 by RT PCR NEGATIVE NEGATIVE Final    Comment: (NOTE) SARS-CoV-2 target nucleic acids are NOT DETECTED.  The SARS-CoV-2 RNA is generally detectable in upper respiratoy specimens during the acute phase of infection. The lowest concentration of SARS-CoV-2 viral copies this assay can detect is 131 copies/mL. A negative result does not preclude SARS-Cov-2 infection and should not be used as the sole basis for treatment or other patient management decisions. A negative result may occur with  improper specimen collection/handling, submission of specimen other than nasopharyngeal swab, presence of viral mutation(s) within the areas targeted by this assay, and inadequate number of viral copies (<131 copies/mL). A negative result must be combined with clinical observations, patient history, and epidemiological information. The expected result is Negative.  Fact Sheet for Patients:  https://www.moore.com/  Fact Sheet for Healthcare Providers:  https://www.young.biz/  This test is no t yet approved or cleared by the [place] FDA and  has been authorized for detection and/or diagnosis of SARS-CoV-2 by FDA under an Emergency Use Authorization (EUA). This EUA will remain  in effect (meaning this test can be used) for the duration of the COVID-19 declaration under Section 564(b)(1) of the Act, 21 U.S.C. section 360bbb-3(b)(1), unless the authorization is terminated or revoked sooner.     Influenza A by PCR NEGATIVE NEGATIVE Final   Influenza B by PCR NEGATIVE NEGATIVE Final    Comment: (NOTE) The Xpert Xpress SARS-CoV-2/FLU/RSV assay is intended as an aid in  the diagnosis of influenza from Nasopharyngeal swab specimens and  should not be used as  a sole basis for treatment. Nasal washings and  aspirates are unacceptable for Xpert Xpress SARS-CoV-2/FLU/RSV  testing.  Fact Sheet for Patients: https://www.moore.com/  Fact Sheet for Healthcare Providers: https://www.young.biz/  This test is not yet approved or cleared by the [place] FDA and  has been authorized for detection and/or diagnosis of SARS-CoV-2 by  FDA under an Emergency Use Authorization (EUA). This EUA will remain  in effect (meaning this test can be used) for the duration of the  Covid-19 declaration under Section 564(b)(1) of the  Act, 21  U.S.C. section 360bbb-3(b)(1), unless the authorization is  terminated or revoked. Performed at [facility], [address] [place], [place] [address]          Radiology Studies: CT Abdomen Pelvis W Contrast  Result Date: 04/14/2020 CLINICAL DATA:  29 year old female with abdominal pain. EXAM: CT ABDOMEN AND PELVIS WITH CONTRAST TECHNIQUE: Multidetector CT imaging of the abdomen and pelvis was performed using the standard protocol following bolus administration of intravenous contrast. CONTRAST:  53mL OMNIPAQUE IOHEXOL 300 MG/ML  SOLN COMPARISON:  CT abdomen pelvis dated 07/06/2019. FINDINGS: Lower chest: The visualized lung bases are clear. No intra-abdominal free air or free fluid. Hepatobiliary: No focal liver abnormality is seen. No gallstones, gallbladder wall thickening, or biliary dilatation. Pancreas: Unremarkable. No pancreatic ductal dilatation or surrounding inflammatory changes. Spleen: Normal in size without focal abnormality. Adrenals/Urinary Tract: The adrenal glands unremarkable. Subcentimeter right renal inferior pole hypodense focus is not characterized. There is no hydronephrosis on either side. Status post cystectomy with right lower quadrant urostomy. Stomach/Bowel: Postsurgical changes of bowel with anastomotic sutures. Evaluation of the bowel is  limited in the absence of oral contrast. Multiple dilated loops of small bowel throughout the abdomen measuring up to 3.6 cm in caliber. A transition may be present in the right lower quadrant (coronal 38/4 and axial 56/2). CT with oral contrast may provide better evaluation. There is loose stool and air within the colon. Vascular/Lymphatic: The abdominal aorta and IVC unremarkable. No portal venous gas. There is no adenopathy. Reproductive: The uterus and ovaries are grossly unremarkable. No pelvic mass. Other: Anterior abdominal wall surgical scar. Musculoskeletal: No acute or significant osseous findings. Sacral stimulator noted. IMPRESSION: 1. Dilated loops of small bowel may represent ileus but concerning for small-bowel obstruction with possible transition in the right lower quadrant. CT with oral contrast may provide better evaluation. 2. Status post cystectomy with right lower quadrant urostomy. Electronically Signed   By: Elgie Collard M.D.   On: 04/14/2020 00:05   DG Chest Portable 1 View  Result Date: 04/13/2020 CLINICAL DATA:  Chest pain for several hours EXAM: PORTABLE CHEST 1 VIEW COMPARISON:  11/26/2017 FINDINGS: Cardiac shadow is within normal limits. The lungs are well aerated bilaterally. No focal infiltrate or sizable effusion is seen. No bony abnormality is noted. IMPRESSION: No acute abnormality seen. Electronically Signed   By: Alcide Clever M.D.   On: 04/13/2020 23:06        Scheduled Meds: . diatrizoate meglumine-sodium  90 mL Per NG tube Once  . levothyroxine  87.5 mcg Intravenous Daily  . metoprolol tartrate  2.5 mg Intravenous Q6H   Continuous Infusions: . ciprofloxacin 400 mg (04/14/20 0521)  . dextrose 5 % and 0.9% NaCl 75 mL/hr at 04/14/20 0519  . potassium chloride 10 mEq (04/14/20 1436)     LOS: 0 days    Time spent: 35 minutes  No charge.    Ramiro Harvest, MD [facility]   To contact the attending provider between 7A-7P or the covering  provider during after hours 7P-7A, please log into the web site [email or web address] and access using universal [place] password for that web site. If you do not have the password, please call the hospital operator.  04/14/2020, 2:41 PM

## 2020-04-15 ENCOUNTER — Inpatient Hospital Stay (HOSPITAL_COMMUNITY): Payer: Medicaid Other

## 2020-04-15 DIAGNOSIS — Z8639 Personal history of other endocrine, nutritional and metabolic disease: Secondary | ICD-10-CM

## 2020-04-15 DIAGNOSIS — E876 Hypokalemia: Secondary | ICD-10-CM

## 2020-04-15 LAB — RENAL FUNCTION PANEL
Albumin: 3.2 g/dL — ABNORMAL LOW (ref 3.5–5.0)
Anion gap: 7 (ref 5–15)
BUN: <5 mg/dL — ABNORMAL LOW (ref 6–20)
CO2: 19 mmol/L — ABNORMAL LOW (ref 22–32)
Calcium: 8.1 mg/dL — ABNORMAL LOW (ref 8.9–10.3)
Chloride: 111 mmol/L (ref 98–111)
Creatinine, Ser: 0.63 mg/dL (ref 0.44–1.00)
GFR, Estimated: >60 mL/min (ref >60)
Glucose, Bld: 103 mg/dL — ABNORMAL HIGH (ref 70–99)
Phosphorus: 2.8 mg/dL (ref 2.5–4.6)
Potassium: 3.7 mmol/L (ref 3.5–5.1)
Sodium: 137 mmol/L (ref 135–145)

## 2020-04-15 LAB — GLUCOSE, CAPILLARY
Glucose-Capillary: 100 mg/dL — ABNORMAL HIGH (ref 70–99)
Glucose-Capillary: 108 mg/dL — ABNORMAL HIGH (ref 70–99)
Glucose-Capillary: 111 mg/dL — ABNORMAL HIGH (ref 70–99)
Glucose-Capillary: 115 mg/dL — ABNORMAL HIGH (ref 70–99)

## 2020-04-15 LAB — CBC WITH DIFFERENTIAL/PLATELET
Abs Immature Granulocytes: 0.01 10*3/uL (ref 0.00–0.07)
Basophils Absolute: 0 10*3/uL (ref 0.0–0.1)
Basophils Relative: 0 %
Eosinophils Absolute: 0.1 10*3/uL (ref 0.0–0.5)
Eosinophils Relative: 2 %
HCT: 33.7 % — ABNORMAL LOW (ref 36.0–46.0)
Hemoglobin: 10.7 g/dL — ABNORMAL LOW (ref 12.0–15.0)
Immature Granulocytes: 0 %
Lymphocytes Relative: 21 %
Lymphs Abs: 0.9 10*3/uL (ref 0.7–4.0)
MCH: 28.6 pg (ref 26.0–34.0)
MCHC: 31.8 g/dL (ref 30.0–36.0)
MCV: 90.1 fL (ref 80.0–100.0)
Monocytes Absolute: 0.3 10*3/uL (ref 0.1–1.0)
Monocytes Relative: 7 %
Neutro Abs: 3.2 10*3/uL (ref 1.7–7.7)
Neutrophils Relative %: 70 %
Platelets: 157 10*3/uL (ref 150–400)
RBC: 3.74 MIL/uL — ABNORMAL LOW (ref 3.87–5.11)
RDW: 14.9 % (ref 11.5–15.5)
WBC: 4.5 10*3/uL (ref 4.0–10.5)
nRBC: 0 % (ref 0.0–0.2)

## 2020-04-15 LAB — URINE CULTURE

## 2020-04-15 LAB — MAGNESIUM: Magnesium: 2.2 mg/dL (ref 1.7–2.4)

## 2020-04-15 MED ORDER — POTASSIUM CHLORIDE 10 MEQ/100ML IV SOLN
10.0000 meq | INTRAVENOUS | Status: AC
  Administered 2020-04-15 (×4): 10 meq via INTRAVENOUS
  Filled 2020-04-15 (×2): qty 100

## 2020-04-15 MED ORDER — BISACODYL 10 MG RE SUPP
10.0000 mg | Freq: Once | RECTAL | Status: AC
  Administered 2020-04-15: 10 mg via RECTAL
  Filled 2020-04-15: qty 1

## 2020-04-15 MED ORDER — SUMATRIPTAN SUCCINATE 6 MG/0.5ML ~~LOC~~ SOLN
6.0000 mg | Freq: Once | SUBCUTANEOUS | Status: AC
  Administered 2020-04-15: 6 mg via SUBCUTANEOUS
  Filled 2020-04-15: qty 0.5

## 2020-04-15 MED ORDER — PHENOL 1.4 % MT LIQD: 1.0000 | OROMUCOSAL | Status: DC | PRN

## 2020-04-15 MED ORDER — KCL IN DEXTROSE-NACL 40-5-0.9 MEQ/L-%-% IV SOLN
INTRAVENOUS | Status: DC
  Filled 2020-04-15 (×2): qty 1000

## 2020-04-15 MED ORDER — LIP MEDEX EX OINT
TOPICAL_OINTMENT | CUTANEOUS | Status: AC
  Filled 2020-04-15: qty 7

## 2020-04-15 NOTE — Progress Notes (Signed)
Pt stated she had large liquid BM after suppository.

## 2020-04-15 NOTE — Progress Notes (Signed)
PROGRESS NOTE    [name]  MNO:[id number] DOB: July 16, 1990 DOA: 04/13/2020 PCP: [name], PA-C   Chief Complaint  Patient presents with  . Abdominal Pain    Brief Narrative:  HPI per Dr. Claudia Bryan is a 29 y.o. 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  Has had a bowel movement about 8 hours prior to coming to the ER.  No vomiting or diarrhea denies any fever chills.  Pain is persistent.  ED Course: In the ER patient was afebrile and CT abdomen pelvis done without contrast shows features concerning for possible ileus versus developing bowel obstruction.  Patient has not had any vomiting in the ER.  Pain improved with pain relief medications.  Covid test was negative labs are significant for mild hypokalemia I sensitive troponins were negative EKG shows normal sinus rhythm.  UA shows features concerning for UTI.  Assessment & Plan:   Principal Problem:   SBO (small bowel obstruction) (HCC) Active Problems:   Abdominal pain   H/O Graves' disease   Sjogren's disease (HCC)   Migraine with aura   Chronic interstitial cystitis with hematuria   Hypokalemia   Hypomagnesemia   Acute lower UTI   Small bowel obstruction (HCC)  1 abdominal pain secondary to small bowel obstruction. Patient presented with epigastric abdominal pain and left-sided periumbilical abdominal pain.  Patient with prior history of and concerning for adhesions as the etiology of probable small bowel obstruction.  Patient with nausea.  No emesis.  NG tube placement was tried 3 separate occasions on admission  with no success.  NG tube placement tried by general surgery yesterday with no success.  NG tube subsequently placed under fluoroscopy yesterday, however overnight with emesis patient dislodged/pulled out NG tube as she felt it was called in her throat.  Patient with abdominal distention, ongoing abdominal pain.  Replete electrolytes.  Keep potassium >4, magnesium >2.  Patient undergoing SBO protocol.  Per general surgery.   2.  Probable UTI Urine cultures with multiple species.  Continue IV ciprofloxacin D2/3.  3.  History of migraines Patient noted to be on propranolol for prophylaxis.  Patient placed on IV metoprolol to prevent beta-blocker withdrawal.  Once tolerating oral intake and resolution of small bowel obstruction will place back on home dose oral beta-blocker.  4.  History of Graves' disease status post ablation IV Synthroid.   5.  History of lupus/Sjogren's syndrome Currently n.p.o.  Continue to hold Plaquenil.  6.  History of chronic interstitial cystitis status post cystectomy and urostomy Urine cultures with multiple species.  Continue IV ciprofloxacin day 2/3.  Follow.   7.  History of bowel resection for necrotizing enterocolitis as an infant was on TPN until age 62.  8.  Hypokalemia/hypomagnesemia Potassium at 3.7 this morning, magnesium at 2.2, phosphorus at 2.8.  KCl 10 mEq IV every 1 hour x4 hours.  Follow.    DVT prophylaxis: SCDs Code Status: Full Family Communication: Updated patient.  No family at bedside. Disposition:   Status is: Inpatient    Dispo: The patient is from: Home              Anticipated  d/c is to: Home              Anticipated d/c date is: 3 to 4 days.              Patient currently with small bowel obstruction, currently n.p.o., general surgery ff, not stable for discharge.       Consultants:   General surgery: Dr. Freida Busman 04/14/2020  Procedures:   CT abdomen and pelvis 04/13/2020  Chest x-ray 04/13/2020    Antimicrobials:     IV ciprofloxacin 04/14/2020 >>>>>   Subjective: Patient laying in bed.  Underwent NG tube placement yesterday under fluoroscopy.  Patient stated still with nausea, had an episode of emesis overnight and NG tube ended up coming out.  Patient stated that she felt NG tube was called in her throat and she could not breathe so she pulled it out.  Denies any emesis this morning.  No flatus.  No bowel movement.  No change with abdominal pain.  Asking when she is going to be able to go home.   Objective: Vitals:   04/14/20 2207 04/14/20 2300 04/14/20 2324 04/15/20 0610  BP: 108/81 106/72  108/83  Pulse: 89 86 76 93  Resp: (!) 22   18  Temp: 98.6 F (37 C)   98.7 F (37.1 C)  TempSrc: Oral   Oral  SpO2: 98%   97%  Weight:      Height:        Intake/Output Summary (Last 24 hours) at 04/15/2020 1010 Last data filed at 04/15/2020 0806 Gross per 24 hour  Intake 0 ml  Output 1100 ml  Net -1100 ml   Filed Weights   04/13/20 1815 04/14/20 0216  Weight: 45 kg 45.9 kg    Examination:  General exam: NAD Respiratory system: CTAB.  No wheezes, no crackles, no rhonchi.  Normal respiratory effort.  Cardiovascular system: Regular rate rhythm no murmurs rubs or gallops.  No JVD.  No lower extremity edema.  Gastrointestinal system: Abdomen is soft, distended, tender to palpation in the epigastrium and left periumbilical region.  Hypoactive bowel sounds.  No rebound.  No guarding.  Urostomy intact with yellow clear urine. Central nervous system: Alert and oriented. No focal neurological deficits. Extremities: Symmetric 5 x 5 power. Skin: No rashes, lesions or ulcers Psychiatry: Judgement and insight appear normal. Mood & affect appropriate.     Data Reviewed: I have personally reviewed following labs and imaging studies  CBC: Recent Labs  Lab 04/13/20 1831 04/14/20 0557 04/15/20 0448  WBC 5.0 4.8 4.5  NEUTROABS  --  3.4 3.2  HGB 12.2 10.7* 10.7*  HCT 37.7 33.0* 33.7*  MCV 89.8 89.2  90.1  PLT 207 152 157    Basic Metabolic Panel: Recent Labs  Lab 04/13/20 1831 04/14/20 0557 04/15/20 0448  NA 143 137 137  K 3.3* 3.2* 3.7  CL 107 108 111  CO2 23 21* 19*  GLUCOSE 106* 126* 103*  BUN 11 9 <5*  CREATININE 0.68 0.71 0.63  CALCIUM 9.9 8.8* 8.1*  MG  --  1.7 2.2  PHOS  --   --  2.8    GFR: Estimated Creatinine Clearance: 73.2 mL/min (by C-G formula based on SCr of 0.63 mg/dL).  Liver Function Tests: Recent Labs  Lab 04/13/20 1831 04/14/20 0557 04/15/20 0448  AST 21 21  --   ALT 16 16  --   ALKPHOS 70 61  --   BILITOT 2.1* 1.9*  --   PROT 8.0  6.3*  --   ALBUMIN 4.2 3.4* 3.2*    CBG: Recent Labs  Lab 04/14/20 1154 04/14/20 1742 04/15/20 0003 04/15/20 0608  GLUCAP 84 100* 108* 111*     Recent Results (from the past 240 hour(s))  Urine culture     Status: Abnormal   Collection Time: 04/14/20 12:03 AM   Specimen: Urine, Clean Catch  Result Value Ref Range Status   Specimen Description   Final    URINE, CLEAN CATCH Performed at [facility], [address] [place], [place] [address]    Special Requests   Final    NONE Performed at [facility], [address] [place], [place] [address]    Culture MULTIPLE SPECIES PRESENT, SUGGEST RECOLLECTION (A)  Final   Report Status 04/15/2020 FINAL  Final  Respiratory Panel by RT PCR (Flu A&B, Covid) - Nasopharyngeal Swab     Status: None   Collection Time: 04/14/20 12:46 AM   Specimen: Nasopharyngeal Swab  Result Value Ref Range Status   SARS Coronavirus 2 by RT PCR NEGATIVE NEGATIVE Final    Comment: (NOTE) SARS-CoV-2 target nucleic acids are NOT DETECTED.  The SARS-CoV-2 RNA is generally detectable in upper respiratoy specimens during the acute phase of infection. The lowest concentration of SARS-CoV-2 viral copies this assay can detect is 131 copies/mL. A negative result does not preclude SARS-Cov-2 infection and should not be used as the sole basis  for treatment or other patient management decisions. A negative result may occur with  improper specimen collection/handling, submission of specimen other than nasopharyngeal swab, presence of viral mutation(s) within the areas targeted by this assay, and inadequate number of viral copies (<131 copies/mL). A negative result must be combined with clinical observations, patient history, and epidemiological information. The expected result is Negative.  Fact Sheet for Patients:  https://www.moore.com/https://www.fda.gov/media/142436/download  Fact Sheet for Healthcare Providers:  https://www.young.biz/https://www.fda.gov/media/142435/download  This test is no t yet approved or cleared by the [place] FDA and  has been authorized for detection and/or diagnosis of SARS-CoV-2 by FDA under an Emergency Use Authorization (EUA). This EUA will remain  in effect (meaning this test can be used) for the duration of the COVID-19 declaration under Section 564(b)(1) of the Act, 21 U.S.C. section 360bbb-3(b)(1), unless the authorization is terminated or revoked sooner.     Influenza A by PCR NEGATIVE NEGATIVE Final   Influenza B by PCR NEGATIVE NEGATIVE Final    Comment: (NOTE) The Xpert Xpress SARS-CoV-2/FLU/RSV assay is intended as an aid in  the diagnosis of influenza from Nasopharyngeal swab specimens and  should not be used as a sole basis for treatment. Nasal washings and  aspirates are unacceptable for Xpert Xpress SARS-CoV-2/FLU/RSV  testing.  Fact Sheet for Patients: https://www.moore.com/https://www.fda.gov/media/142436/download  Fact Sheet for Healthcare Providers: https://www.young.biz/https://www.fda.gov/media/142435/download  This test is not yet approved or cleared by the [place] FDA and  has been authorized for detection and/or diagnosis of SARS-CoV-2 by  FDA under an Emergency Use Authorization (EUA). This EUA will remain  in effect (meaning this test can be used) for the duration of the  Covid-19 declaration under Section 564(b)(1) of the Act, 21    U.S.C. section 360bbb-3(b)(1), unless the authorization is  terminated or revoked. Performed at [facility], [address] [place], [place] [address]          Radiology Studies: CT Abdomen Pelvis W Contrast  Result Date: 04/14/2020 CLINICAL DATA:  29 year old female with abdominal pain. EXAM: CT  ABDOMEN AND PELVIS WITH CONTRAST TECHNIQUE: Multidetector CT imaging of the abdomen and pelvis was performed using the standard protocol following bolus administration of intravenous contrast. CONTRAST:  75mL OMNIPAQUE IOHEXOL 300 MG/ML  SOLN COMPARISON:  CT abdomen pelvis dated 07/06/2019. FINDINGS: Lower chest: The visualized lung bases are clear. No intra-abdominal free air or free fluid. Hepatobiliary: No focal liver abnormality is seen. No gallstones, gallbladder wall thickening, or biliary dilatation. Pancreas: Unremarkable. No pancreatic ductal dilatation or surrounding inflammatory changes. Spleen: Normal in size without focal abnormality. Adrenals/Urinary Tract: The adrenal glands unremarkable. Subcentimeter right renal inferior pole hypodense focus is not characterized. There is no hydronephrosis on either side. Status post cystectomy with right lower quadrant urostomy. Stomach/Bowel: Postsurgical changes of bowel with anastomotic sutures. Evaluation of the bowel is limited in the absence of oral contrast. Multiple dilated loops of small bowel throughout the abdomen measuring up to 3.6 cm in caliber. A transition may be present in the right lower quadrant (coronal 38/4 and axial 56/2). CT with oral contrast may provide better evaluation. There is loose stool and air within the colon. Vascular/Lymphatic: The abdominal aorta and IVC unremarkable. No portal venous gas. There is no adenopathy. Reproductive: The uterus and ovaries are grossly unremarkable. No pelvic mass. Other: Anterior abdominal wall surgical scar. Musculoskeletal: No acute or significant osseous findings. Sacral  stimulator noted. IMPRESSION: 1. Dilated loops of small bowel may represent ileus but concerning for small-bowel obstruction with possible transition in the right lower quadrant. CT with oral contrast may provide better evaluation. 2. Status post cystectomy with right lower quadrant urostomy. Electronically Signed   By: Elgie Collard M.D.   On: 04/14/2020 00:05   DG Chest Portable 1 View  Result Date: 04/13/2020 CLINICAL DATA:  Chest pain for several hours EXAM: PORTABLE CHEST 1 VIEW COMPARISON:  11/26/2017 FINDINGS: Cardiac shadow is within normal limits. The lungs are well aerated bilaterally. No focal infiltrate or sizable effusion is seen. No bony abnormality is noted. IMPRESSION: No acute abnormality seen. Electronically Signed   By: Alcide Clever M.D.   On: 04/13/2020 23:06   DG Abd Portable 1V  Result Date: 04/15/2020 CLINICAL DATA:  Ileus. EXAM: PORTABLE ABDOMEN - 1 VIEW COMPARISON:  04/15/2020, earlier same day FINDINGS: Contrast material is seen in the gastric fundus and diffusely in the colon. Associated diffuse gaseous small bowel distension is similar to prior. Left sacral stimulator device again noted. Visualized bony anatomy unremarkable. IMPRESSION: Diffuse gaseous distention of small bowel and colon with contrast material noted throughout the colonic lumen on today's study. Electronically Signed   By: Kennith Center M.D.   On: 04/15/2020 08:45   DG Abd Portable 1V-Small Bowel Obstruction Protocol-initial, 8 hr delay  Result Date: 04/15/2020 CLINICAL DATA:  Small-bowel obstruction image.  8 hour delay. EXAM: PORTABLE ABDOMEN - 1 VIEW. The upper abdomen is collimated off view. COMPARISON:  CT abdomen pelvis 04/13/2020 FINDINGS: The upper abdomen is collimated off view with suggestion of the tip of an enteric tube overlying the gastric lumen. PO contrast opacifies the visualized portions of the gastric lumen. No PO contrast noted within the duodenum or distally. Dilated gaseous loops of  small bowel within the left mid abdomen gas noted within the colon. Neural stimulator overlying the left iliac bone with leads overlying the pelvis. Surgical clips overlie the pelvis. Bowel anastomotic staples noted overlying the right lower abdomen. IMPRESSION: 1. PO contrast within the gastric lumen. Recommend serial XR abdomen for further evaluation. 2. Small bowel gaseous dilatation  and gas within the colon. Findings suggestive of an ileus. Electronically Signed   By: Tish Frederickson M.D.   On: 04/15/2020 02:20   DG Basil Dess Tube Plc W/Fl W/Rad  Result Date: 04/14/2020 CLINICAL DATA:  Gastric and small bowel dilatation and abdominal pain. NG tube placement requested for suction. EXAM: NASO G TUBE PLACEMENT WITH FL AND WITH RAD CONTRAST:  None. FLUOROSCOPY TIME:  Fluoroscopy Time:  0 minutes 12 seconds Radiation Exposure Index (if provided by the fluoroscopic device): 0.9 mGy Number of Acquired Spot Images: 0 COMPARISON:  04/13/2020 CT abdomen/pelvis. FINDINGS: Nasogastric tube advanced without difficulty into the stomach with tip in the gastric fundus. IMPRESSION: Successful nasogastric tube placement under fluoroscopic guidance with tip in the gastric fundus. Electronically Signed   By: Delbert Phenix M.D.   On: 04/14/2020 15:37        Scheduled Meds: . bisacodyl  10 mg Rectal Once  . levothyroxine  87.5 mcg Intravenous Daily  . metoprolol tartrate  2.5 mg Intravenous Q6H   Continuous Infusions: . ciprofloxacin 400 mg (04/15/20 0701)  . dextrose 5 % and 0.9 % NaCl with KCl 40 mEq/L    . potassium chloride 10 mEq (04/15/20 0905)     LOS: 1 day    Time spent: 35 minutes     Ramiro Harvest, MD [facility]   To contact the attending provider between 7A-7P or the covering provider during after hours 7P-7A, please log into the web site [email or web address] and access using universal [place] password for that web site. If you do not have the password, please call the hospital  operator.  04/15/2020, 10:10 AM

## 2020-04-15 NOTE — Progress Notes (Signed)
Pt stated she had flatus

## 2020-04-15 NOTE — TOC Initial Note (Signed)
Transition of Care [facility]) - Initial/Assessment Note    Patient Details  Name: [name] MRN: [id number] Date of Birth: 10-28-1990  Transition of [facility]) CM/SW Contact:    Lanier Clam, RN Phone Number: 04/15/2020, 3:53 PM  Clinical Narrative: d/cplan home. Has pcp,pharmacy,has medicaid-obligated to co pay $4-12-unable to provide med asst w/insurance. No further CM needs.                  Expected Discharge Plan: Home/Self Care Barriers to Discharge: Continued Medical Work up   Patient Goals and CMS Choice Patient states their goals for this hospitalization and ongoing recovery are:: go home CMS Medicare.gov Compare Post Acute Care list provided to:: Patient Choice offered to / list presented to : NA  Expected Discharge Plan and Services Expected Discharge Plan: Home/Self Care   Discharge Planning Services: CM Consult Post Acute Care Choice: NA Living arrangements for the past 2 months: Single Family Home                                      Prior Living Arrangements/Services Living arrangements for the past 2 months: Single Family Home Lives with:: Parents Patient language and need for interpreter reviewed:: Yes Do you feel safe going back to the place where you live?: Yes      Need for Family Participation in Patient Care: No (Comment) Care giver support system in place?: Yes (comment)   Criminal Activity/Legal Involvement Pertinent to Current Situation/Hospitalization: No - Comment as needed  Activities of Daily Living Home Assistive Devices/Equipment: None ADL Screening (condition at time of admission) Patient's cognitive ability adequate to safely complete daily activities?: Yes Is the patient deaf or have difficulty hearing?: No Does the patient have difficulty seeing, even when wearing glasses/contacts?: No Does the patient have difficulty concentrating, remembering, or making decisions?: No Patient able to express need for assistance with  ADLs?: No Does the patient have difficulty dressing or bathing?: No Independently performs ADLs?: Yes (appropriate for developmental age) Does the patient have difficulty walking or climbing stairs?: No Weakness of Legs: None Weakness of Arms/Hands: None  Permission Sought/Granted Permission sought to share information with : Case Manager Permission granted to share information with : Yes, Verbal Permission Granted  Share Information with NAME: Case Manager           Emotional Assessment Appearance:: Appears stated age Attitude/Demeanor/Rapport: Gracious Affect (typically observed): Accepting Orientation: : Oriented to Self, Oriented to Place, Oriented to  Time, Oriented to Situation Alcohol / Substance Use: Not Applicable Psych Involvement: No (comment)  Admission diagnosis:  Epigastric pain [R10.13] Abdominal pain [R10.9] Patient Active Problem List   Diagnosis Date Noted  . Abdominal pain 04/14/2020  . Hypokalemia 04/14/2020  . Hypomagnesemia 04/14/2020  . Acute lower UTI 04/14/2020  . SBO (small bowel obstruction) (HCC) 04/14/2020  . Small bowel obstruction (HCC)   . Paresthesia 04/07/2019  . Chronic migraine 04/07/2019  . Hypersensitivity reaction 01/13/2016  . Iron deficiency anemia due to chronic blood loss 01/05/2016  . Chronic interstitial cystitis with hematuria   . H/O Graves' disease 07/22/2013  . Sjogren's disease (HCC) 07/22/2013  . Malabsorption syndrome 07/22/2013  . Migraine with aura 07/22/2013  . Pelvic adhesive disease 07/22/2013  . Hypothyroidism complicating pregnancy / delivered 12/28/2011   PCP:  [name], PA-C Pharmacy:   [facility] [phone] - [place], [place] - [address] AT  [facility] [address] [place] [address] Phone: [phone] Fax: [phone]     Social Determinants of Health (SDOH) Interventions    Readmission Risk Interventions No flowsheet data  found.

## 2020-04-15 NOTE — Progress Notes (Signed)
[facility] Progress Note     Subjective: Patient reports she was gagging overnight and NGT become dislodged, felt like it was coiled in her throat and she pulled it out. Not passing flatus or BM. She is distended. Nausea when lying flat but feels a little better sitting up.   Objective: Vital signs in last 24 hours: Temp:  [98.6 F (37 C)-98.7 F (37.1 C)] 98.7 F (37.1 C) (10/15 0610) Pulse Rate:  [76-93] 93 (10/15 0610) Resp:  [16-22] 18 (10/15 0610) BP: (103-108)/(72-83) 108/83 (10/15 0610) SpO2:  [97 %-98 %] 97 % (10/15 0610)    Intake/Output from previous day: 10/14 0701 - 10/15 0700 In: 0  Out: 1100 [Urine:1100] Intake/Output this shift: No intake/output data recorded.  PE: General: pleasant, WD, thin female who is laying in bed in NAD Heart: regular, rate, and rhythm.  Palpable radial and pedal pulses bilaterally Lungs: CTAB, no wheezes, rhonchi, or rales noted.  Respiratory effort nonlabored Abd: soft, mild diffuse ttp, distended, +BS, ileal conduit present     Lab Results:  Recent Labs    04/14/20 0557 04/15/20 0448  WBC 4.8 4.5  HGB 10.7* 10.7*  HCT 33.0* 33.7*  PLT 152 157   BMET Recent Labs    04/14/20 0557 04/15/20 0448  NA 137 137  K 3.2* 3.7  CL 108 111  CO2 21* 19*  GLUCOSE 126* 103*  BUN 9 <5*  CREATININE 0.71 0.63  CALCIUM 8.8* 8.1*   PT/INR No results for input(s): LABPROT, INR in the last 72 hours. CMP     Component Value Date/Time   NA 137 04/15/2020 0448   K 3.7 04/15/2020 0448   CL 111 04/15/2020 0448   CO2 19 (L) 04/15/2020 0448   GLUCOSE 103 (H) 04/15/2020 0448   BUN <5 (L) 04/15/2020 0448   CREATININE 0.63 04/15/2020 0448   CALCIUM 8.1 (L) 04/15/2020 0448   PROT 6.3 (L) 04/14/2020 0557   ALBUMIN 3.2 (L) 04/15/2020 0448   AST 21 04/14/2020 0557   ALT 16 04/14/2020 0557   ALKPHOS 61 04/14/2020 0557   BILITOT 1.9 (H) 04/14/2020 0557   GFRNONAA >60 04/15/2020 0448   GFRAA >60 07/05/2019 1909   Lipase      Component Value Date/Time   LIPASE 38 04/13/2020 1831       Studies/Results: CT Abdomen Pelvis W Contrast  Result Date: 04/14/2020 CLINICAL DATA:  29 year old female with abdominal pain. EXAM: CT ABDOMEN AND PELVIS WITH CONTRAST TECHNIQUE: Multidetector CT imaging of the abdomen and pelvis was performed using the standard protocol following bolus administration of intravenous contrast. CONTRAST:  20mL OMNIPAQUE IOHEXOL 300 MG/ML  SOLN COMPARISON:  CT abdomen pelvis dated 07/06/2019. FINDINGS: Lower chest: The visualized lung bases are clear. No intra-abdominal free air or free fluid. Hepatobiliary: No focal liver abnormality is seen. No gallstones, gallbladder wall thickening, or biliary dilatation. Pancreas: Unremarkable. No pancreatic ductal dilatation or surrounding inflammatory changes. Spleen: Normal in size without focal abnormality. Adrenals/Urinary Tract: The adrenal glands unremarkable. Subcentimeter right renal inferior pole hypodense focus is not characterized. There is no hydronephrosis on either side. Status post cystectomy with right lower quadrant urostomy. Stomach/Bowel: Postsurgical changes of bowel with anastomotic sutures. Evaluation of the bowel is limited in the absence of oral contrast. Multiple dilated loops of small bowel throughout the abdomen measuring up to 3.6 cm in caliber. A transition may be present in the right lower quadrant (coronal 38/4 and axial 56/2). CT with oral contrast may provide better evaluation.  There is loose stool and air within the colon. Vascular/Lymphatic: The abdominal aorta and IVC unremarkable. No portal venous gas. There is no adenopathy. Reproductive: The uterus and ovaries are grossly unremarkable. No pelvic mass. Other: Anterior abdominal wall surgical scar. Musculoskeletal: No acute or significant osseous findings. Sacral stimulator noted. IMPRESSION: 1. Dilated loops of small bowel may represent ileus but concerning for small-bowel  obstruction with possible transition in the right lower quadrant. CT with oral contrast may provide better evaluation. 2. Status post cystectomy with right lower quadrant urostomy. Electronically Signed   By: Elgie Collard M.D.   On: 04/14/2020 00:05   DG Chest Portable 1 View  Result Date: 04/13/2020 CLINICAL DATA:  Chest pain for several hours EXAM: PORTABLE CHEST 1 VIEW COMPARISON:  11/26/2017 FINDINGS: Cardiac shadow is within normal limits. The lungs are well aerated bilaterally. No focal infiltrate or sizable effusion is seen. No bony abnormality is noted. IMPRESSION: No acute abnormality seen. Electronically Signed   By: Alcide Clever M.D.   On: 04/13/2020 23:06   DG Abd Portable 1V-Small Bowel Obstruction Protocol-initial, 8 hr delay  Result Date: 04/15/2020 CLINICAL DATA:  Small-bowel obstruction image.  8 hour delay. EXAM: PORTABLE ABDOMEN - 1 VIEW. The upper abdomen is collimated off view. COMPARISON:  CT abdomen pelvis 04/13/2020 FINDINGS: The upper abdomen is collimated off view with suggestion of the tip of an enteric tube overlying the gastric lumen. PO contrast opacifies the visualized portions of the gastric lumen. No PO contrast noted within the duodenum or distally. Dilated gaseous loops of small bowel within the left mid abdomen gas noted within the colon. Neural stimulator overlying the left iliac bone with leads overlying the pelvis. Surgical clips overlie the pelvis. Bowel anastomotic staples noted overlying the right lower abdomen. IMPRESSION: 1. PO contrast within the gastric lumen. Recommend serial XR abdomen for further evaluation. 2. Small bowel gaseous dilatation and gas within the colon. Findings suggestive of an ileus. Electronically Signed   By: Tish Frederickson M.D.   On: 04/15/2020 02:20   DG Basil Dess Tube Plc W/Fl W/Rad  Result Date: 04/14/2020 CLINICAL DATA:  Gastric and small bowel dilatation and abdominal pain. NG tube placement requested for suction. EXAM: NASO G  TUBE PLACEMENT WITH FL AND WITH RAD CONTRAST:  None. FLUOROSCOPY TIME:  Fluoroscopy Time:  0 minutes 12 seconds Radiation Exposure Index (if provided by the fluoroscopic device): 0.9 mGy Number of Acquired Spot Images: 0 COMPARISON:  04/13/2020 CT abdomen/pelvis. FINDINGS: Nasogastric tube advanced without difficulty into the stomach with tip in the gastric fundus. IMPRESSION: Successful nasogastric tube placement under fluoroscopic guidance with tip in the gastric fundus. Electronically Signed   By: Delbert Phenix M.D.   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

## 2020-04-15 NOTE — Progress Notes (Signed)
Patient stated that she has a headache, currently NPO.  PCP was notified.

## 2020-04-16 LAB — CBC
HCT: 33.5 % — ABNORMAL LOW (ref 36.0–46.0)
Hemoglobin: 10.4 g/dL — ABNORMAL LOW (ref 12.0–15.0)
MCH: 28.4 pg (ref 26.0–34.0)
MCHC: 31 g/dL (ref 30.0–36.0)
MCV: 91.5 fL (ref 80.0–100.0)
Platelets: 152 10*3/uL (ref 150–400)
RBC: 3.66 MIL/uL — ABNORMAL LOW (ref 3.87–5.11)
RDW: 14.9 % (ref 11.5–15.5)
WBC: 3.8 10*3/uL — ABNORMAL LOW (ref 4.0–10.5)
nRBC: 0 % (ref 0.0–0.2)

## 2020-04-16 LAB — GLUCOSE, CAPILLARY
Glucose-Capillary: 109 mg/dL — ABNORMAL HIGH (ref 70–99)
Glucose-Capillary: 116 mg/dL — ABNORMAL HIGH (ref 70–99)
Glucose-Capillary: 93 mg/dL (ref 70–99)
Glucose-Capillary: 96 mg/dL (ref 70–99)
Glucose-Capillary: 96 mg/dL (ref 70–99)
Glucose-Capillary: 98 mg/dL (ref 70–99)

## 2020-04-16 LAB — RENAL FUNCTION PANEL
Albumin: 3.2 g/dL — ABNORMAL LOW (ref 3.5–5.0)
Anion gap: 5 (ref 5–15)
BUN: <5 mg/dL — ABNORMAL LOW (ref 6–20)
CO2: 22 mmol/L (ref 22–32)
Calcium: 8.9 mg/dL (ref 8.9–10.3)
Chloride: 111 mmol/L (ref 98–111)
Creatinine, Ser: 0.69 mg/dL (ref 0.44–1.00)
GFR, Estimated: >60 mL/min (ref >60)
Glucose, Bld: 85 mg/dL (ref 70–99)
Phosphorus: 2.5 mg/dL (ref 2.5–4.6)
Potassium: 4.6 mmol/L (ref 3.5–5.1)
Sodium: 138 mmol/L (ref 135–145)

## 2020-04-16 LAB — MAGNESIUM: Magnesium: 2.2 mg/dL (ref 1.7–2.4)

## 2020-04-16 MED ORDER — DEXTROSE-NACL 5-0.9 % IV SOLN: INTRAVENOUS | Status: DC

## 2020-04-16 MED ORDER — METOPROLOL TARTRATE 5 MG/5ML IV SOLN
2.5000 mg | Freq: Three times a day (TID) | INTRAVENOUS | Status: DC
  Administered 2020-04-16 – 2020-04-17 (×2): 2.5 mg via INTRAVENOUS
  Filled 2020-04-16 (×2): qty 5

## 2020-04-16 MED ORDER — DIPHENHYDRAMINE HCL 50 MG/ML IJ SOLN
25.0000 mg | Freq: Once | INTRAMUSCULAR | Status: AC
  Administered 2020-04-16: 25 mg via INTRAVENOUS
  Filled 2020-04-16: qty 1

## 2020-04-16 NOTE — Progress Notes (Signed)
Pt c/o abdominal distention and tenderness. Pt has same tenderness as this AM, just a small amount of new distention. Pt c/o no N/V associated with it. MD on floor and notified. Encouraged pt to ambulate and to call if N/V or if distention worsens.

## 2020-04-16 NOTE — Progress Notes (Addendum)
PROGRESS NOTE    [name]  YQM:[id number] DOB: [birth year] DOA: 04/13/2020 PCP: [name], PA-C   Chief Complaint  Patient presents with  . Abdominal Pain    Brief Narrative:  HPI per Dr. [name] is a 29 y.o. 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  Has had a bowel movement about 8 hours prior to coming to the ER.  No vomiting or diarrhea denies any fever chills.  Pain is persistent.  ED Course: In the ER patient was afebrile and CT abdomen pelvis done without contrast shows features concerning for possible ileus versus developing bowel obstruction.  Patient has not had any vomiting in the ER.  Pain improved with pain relief medications.  Covid test was negative labs are significant for mild hypokalemia I sensitive troponins were negative EKG shows normal sinus rhythm.  UA shows features concerning for UTI.  Assessment & Plan:   Principal Problem:   SBO (small bowel obstruction) (HCC) Active Problems:   Abdominal pain   H/O Graves' disease   Sjogren's disease (HCC)   Migraine with aura   Chronic interstitial cystitis with hematuria   Hypokalemia   Hypomagnesemia   Acute lower UTI   Small bowel obstruction (HCC)  1 abdominal pain secondary to small bowel obstruction. Patient presented with epigastric abdominal pain and left-sided periumbilical abdominal pain.  Patient with prior history of and concerning for adhesions as the etiology of probable small bowel obstruction.  Patient with nausea.  No emesis.  NG tube placement was tried 3 separate occasions on admission  with no success.  NG tube placement tried by general surgery with no success.  NG tube subsequently placed under fluoroscopy (04/14/2020), however the evening of 04/14/2020 patient with emesis, patient dislodged/pulled out NG tube as she felt it was called in her throat.  Patient with some improvement with abdominal distention and abdominal pain.  Noted to have liquid stool yesterday.  No emesis.  Some nausea.  Patient being followed by general surgery and has been placed on a clear liquid diet for now. Keep potassium >4, magnesium >2.  Patient undergoing SBO protocol.  Continue mobilization.  Per general surgery.   2.  Probable UTI Urine cultures with multiple species.  Continue IV ciprofloxacin D3/3.  3.  History of migraines Patient noted to be on propranolol for prophylaxis.  Patient placed on IV metoprolol to prevent beta-blocker withdrawal.  Once tolerating oral intake and resolution of small bowel obstruction will place back on home dose oral beta-blocker.  4.  History of Graves' disease status post ablation Continue IV Synthroid.    5.  History of lupus/Sjogren's syndrome Continue to hold Plaquenil.  6.  History of chronic interstitial cystitis status post cystectomy and urostomy Urine cultures with multiple species.  Continue IV ciprofloxacin day 3/3.  Follow.   7.  History of bowel resection for necrotizing enterocolitis as an infant was on TPN until age 83.  8.  Hypokalemia/hypomagnesemia Potassium at 4.6.  Magnesium at 2.2.  Phosphorus at 2.5.  Follow.    DVT prophylaxis: SCDs Code Status: Full Family Communication: Updated patient.  No family at bedside. Disposition:   Status  is: Inpatient    Dispo: The patient is from: Home              Anticipated d/c is to: Home              Anticipated d/c date is: Hopefully in 2 to 3 days.               Patient currently with small bowel obstruction, started on clears, not stable for discharge, general surgery following.         Consultants:   General surgery: Dr. Freida BusmanAllen 04/14/2020  Procedures:   CT abdomen and pelvis 04/13/2020  Chest x-ray 04/13/2020  SBO protocol  Antimicrobials:   IV ciprofloxacin 04/14/2020 >>>>> 04/16/2020   Subjective: Patient sitting up in bed.  States abdominal pain has improved since admission.  Denies any emesis.  Did endorse some nausea with clear liquids this morning however was able to tolerate it.  States no significant abdominal pain with clear liquids.  Stated had liquid stool yesterday, some flatus yesterday.  No chest pain.  No shortness of breath.   Objective: Vitals:   04/15/20 0610 04/15/20 1326 04/15/20 2136 04/16/20 0659  BP: 108/83 101/74 103/68 108/75  Pulse: 93 72 (!) 56 (!) 45  Resp: 18 14  18   Temp: 98.7 F (37.1 C) 98.2 F (36.8 C) 97.9 F (36.6 C) 97.8 F (36.6 C)  TempSrc: Oral Oral Oral Oral  SpO2: 97% 99% 99% 100%  Weight:      Height:        Intake/Output Summary (Last 24 hours) at 04/16/2020 1121 Last data filed at 04/16/2020 1112 Gross per 24 hour  Intake 1522.66 ml  Output 3277 ml  Net -1754.34 ml   Filed Weights   04/13/20 1815 04/14/20 0216  Weight: 45 kg 45.9 kg    Examination:  General exam: NAD Respiratory system: Lungs clear to auscultation bilaterally.  No wheezes, no crackles, no rhonchi.  Normal respiratory effort.  Cardiovascular system: Bradycardia.  No murmurs rubs or gallops.  No JVD.  No lower extremity edema.   Gastrointestinal system: Abdomen is soft, less distended, decreased tenderness to palpation in the epigastrium and left periumbilical region.  Hypoactive bowel sounds.  No rebound.  No guarding.  Urostomy intact with yellow clear urine. Central nervous system: Alert and oriented. No focal neurological deficits. Extremities: Symmetric 5 x 5 power. Skin: No rashes, lesions or ulcers Psychiatry: Judgement and insight appear normal. Mood & affect appropriate.     Data Reviewed: I have personally reviewed  following labs and imaging studies  CBC: Recent Labs  Lab 04/13/20 1831 04/14/20 0557 04/15/20 0448 04/16/20 0736  WBC 5.0 4.8 4.5 3.8*  NEUTROABS  --  3.4 3.2  --   HGB 12.2 10.7* 10.7* 10.4*  HCT 37.7 33.0* 33.7* 33.5*  MCV 89.8 89.2 90.1 91.5  PLT 207 152 157 152    Basic Metabolic Panel: Recent Labs  Lab 04/13/20 1831 04/14/20 0557 04/15/20 0448 04/16/20 0736  NA 143 137 137 138  K 3.3* 3.2* 3.7 4.6  CL 107 108 111 111  CO2 23 21* 19* 22  GLUCOSE 106* 126* 103* 85  BUN 11 9 <5* <5*  CREATININE 0.68 0.71 0.63 0.69  CALCIUM 9.9 8.8* 8.1* 8.9  MG  --  1.7 2.2 2.2  PHOS  --   --  2.8 2.5    GFR: Estimated Creatinine Clearance: 73.2 mL/min (by C-G formula based on SCr of 0.69 mg/dL).  Liver Function Tests:  Recent Labs  Lab 04/13/20 1831 04/14/20 0557 04/15/20 0448 04/16/20 0736  AST 21 21  --   --   ALT 16 16  --   --   ALKPHOS 70 61  --   --   BILITOT 2.1* 1.9*  --   --   PROT 8.0 6.3*  --   --   ALBUMIN 4.2 3.4* 3.2* 3.2*    CBG: Recent Labs  Lab 04/15/20 0608 04/15/20 1151 04/15/20 1743 04/16/20 0011 04/16/20 0655  GLUCAP 111* 100* 115* 96 109*     Recent Results (from the past 240 hour(s))  Urine culture     Status: Abnormal   Collection Time: 04/14/20 12:03 AM   Specimen: Urine, Clean Catch  Result Value Ref Range Status   Specimen Description   Final    URINE, CLEAN CATCH Performed at [facility], [address], [place], [place] [address]    Special Requests   Final    NONE Performed at [facility], [address], [place], [place] [address]    Culture MULTIPLE SPECIES PRESENT, SUGGEST RECOLLECTION (A)  Final   Report Status 04/15/2020 FINAL  Final  Respiratory Panel by RT PCR (Flu A&B, Covid) - Nasopharyngeal Swab     Status: None   Collection Time: 04/14/20 12:46 AM   Specimen: Nasopharyngeal Swab  Result Value Ref Range Status   SARS Coronavirus 2 by RT PCR NEGATIVE NEGATIVE Final     Comment: (NOTE) SARS-CoV-2 target nucleic acids are NOT DETECTED.  The SARS-CoV-2 RNA is generally detectable in upper respiratoy specimens during the acute phase of infection. The lowest concentration of SARS-CoV-2 viral copies this assay can detect is 131 copies/mL. A negative result does not preclude SARS-Cov-2 infection and should not be used as the sole basis for treatment or other patient management decisions. A negative result may occur with  improper specimen collection/handling, submission of specimen other than nasopharyngeal swab, presence of viral mutation(s) within the areas targeted by this assay, and inadequate number of viral copies (<131 copies/mL). A negative result must be combined with clinical observations, patient history, and epidemiological information. The expected result is Negative.  Fact Sheet for Patients:  https://www.moore.com/  Fact Sheet for Healthcare Providers:  https://www.young.biz/  This test is no t yet approved or cleared by the [place] FDA and  has been authorized for detection and/or diagnosis of SARS-CoV-2 by FDA under an Emergency Use Authorization (EUA). This EUA will remain  in effect (meaning this test can be used) for the duration of the COVID-19 declaration under Section 564(b)(1) of the Act, 21 U.S.C. section 360bbb-3(b)(1), unless the authorization is terminated or revoked sooner.     Influenza A by PCR NEGATIVE NEGATIVE Final   Influenza B by PCR NEGATIVE NEGATIVE Final    Comment: (NOTE) The Xpert Xpress SARS-CoV-2/FLU/RSV assay is intended as an aid in  the diagnosis of influenza from Nasopharyngeal swab specimens and  should not be used as a sole basis for treatment. Nasal washings and  aspirates are unacceptable for Xpert Xpress SARS-CoV-2/FLU/RSV  testing.  Fact Sheet for Patients: https://www.moore.com/  Fact Sheet for Healthcare  Providers: https://www.young.biz/  This test is not yet approved or cleared by the [place] FDA and  has been authorized for detection and/or diagnosis of SARS-CoV-2 by  FDA under an Emergency Use Authorization (EUA). This EUA will remain  in effect (meaning this test can be used) for the duration of the  Covid-19 declaration under  Section 564(b)(1) of the Act, 21  U.S.C. section 360bbb-3(b)(1), unless the authorization is  terminated or revoked. Performed at [facility], [address], [place], [place] [address]          Radiology Studies: DG Abd Portable 1V  Result Date: 04/15/2020 CLINICAL DATA:  Ileus. EXAM: PORTABLE ABDOMEN - 1 VIEW COMPARISON:  04/15/2020, earlier same day FINDINGS: Contrast material is seen in the gastric fundus and diffusely in the colon. Associated diffuse gaseous small bowel distension is similar to prior. Left sacral stimulator device again noted. Visualized bony anatomy unremarkable. IMPRESSION: Diffuse gaseous distention of small bowel and colon with contrast material noted throughout the colonic lumen on today's study. Electronically Signed   By: Kennith Center M.D.   On: 04/15/2020 08:45   DG Abd Portable 1V-Small Bowel Obstruction Protocol-initial, 8 hr delay  Result Date: 04/15/2020 CLINICAL DATA:  Small-bowel obstruction image.  8 hour delay. EXAM: PORTABLE ABDOMEN - 1 VIEW. The upper abdomen is collimated off view. COMPARISON:  CT abdomen pelvis 04/13/2020 FINDINGS: The upper abdomen is collimated off view with suggestion of the tip of an enteric tube overlying the gastric lumen. PO contrast opacifies the visualized portions of the gastric lumen. No PO contrast noted within the duodenum or distally. Dilated gaseous loops of small bowel within the left mid abdomen gas noted within the colon. Neural stimulator overlying the left iliac bone with leads overlying the pelvis. Surgical clips overlie the pelvis. Bowel  anastomotic staples noted overlying the right lower abdomen. IMPRESSION: 1. PO contrast within the gastric lumen. Recommend serial XR abdomen for further evaluation. 2. Small bowel gaseous dilatation and gas within the colon. Findings suggestive of an ileus. Electronically Signed   By: Tish Frederickson M.D.   On: 04/15/2020 02:20   DG Basil Dess Tube Plc W/Fl W/Rad  Result Date: 04/14/2020 CLINICAL DATA:  Gastric and small bowel dilatation and abdominal pain. NG tube placement requested for suction. EXAM: NASO G TUBE PLACEMENT WITH FL AND WITH RAD CONTRAST:  None. FLUOROSCOPY TIME:  Fluoroscopy Time:  0 minutes 12 seconds Radiation Exposure Index (if provided by the fluoroscopic device): 0.9 mGy Number of Acquired Spot Images: 0 COMPARISON:  04/13/2020 CT abdomen/pelvis. FINDINGS: Nasogastric tube advanced without difficulty into the stomach with tip in the gastric fundus. IMPRESSION: Successful nasogastric tube placement under fluoroscopic guidance with tip in the gastric fundus. Electronically Signed   By: Delbert Phenix M.D.   On: 04/14/2020 15:37        Scheduled Meds: . levothyroxine  87.5 mcg Intravenous Daily  . metoprolol tartrate  2.5 mg Intravenous Q6H   Continuous Infusions: . ciprofloxacin 400 mg (04/16/20 0511)  . dextrose 5 % and 0.9 % NaCl with KCl 40 mEq/L Stopped (04/16/20 1118)     LOS: 2 days    Time spent: 35 minutes     Ramiro Harvest, MD [facility]   To contact the attending provider between 7A-7P or the covering provider during after hours 7P-7A, please log into the web site [email or web address] and access using universal [place] password for that web site. If you do not have the password, please call the hospital operator.  04/16/2020, 11:21 AM

## 2020-04-16 NOTE — Progress Notes (Signed)
Subjective/Chief Complaint: NONE NO VOMITING some mild nausea  Having some flatus and liquid stool   Objective: Vital signs in last 24 hours: Temp:  [97.8 F (36.6 C)-98.2 F (36.8 C)] 97.8 F (36.6 C) (10/16 0659) Pulse Rate:  [45-72] 45 (10/16 0659) Resp:  [14-18] 18 (10/16 0659) BP: (101-108)/(68-75) 108/75 (10/16 0659) SpO2:  [99 %-100 %] 100 % (10/16 0659)    Intake/Output from previous day: 10/15 0701 - 10/16 0700 In: 1562.7 [P.O.:240; I.V.:608.8; IV Piggyback:714] Out: 1377 [Urine:1377] Intake/Output this shift: No intake/output data recorded.   General: pleasant, WD, thin female who is laying in bed in NAD Heart: regular, rate, and rhythm.  Palpable radial and pedal pulses bilaterally Lungs: CTAB, no wheezes, rhonchi, or rales noted.  Respiratory effort nonlabored Abd: soft, mild diffuse ttp, distended, +BS, ileal conduit present  Lab Results:  Recent Labs    04/14/20 0557 04/15/20 0448  WBC 4.8 4.5  HGB 10.7* 10.7*  HCT 33.0* 33.7*  PLT 152 157   BMET Recent Labs    04/14/20 0557 04/15/20 0448  NA 137 137  K 3.2* 3.7  CL 108 111  CO2 21* 19*  GLUCOSE 126* 103*  BUN 9 <5*  CREATININE 0.71 0.63  CALCIUM 8.8* 8.1*   PT/INR No results for input(s): LABPROT, INR in the last 72 hours. ABG No results for input(s): PHART, HCO3 in the last 72 hours.  Invalid input(s): PCO2, PO2  Studies/Results: DG Abd Portable 1V  Result Date: 04/15/2020 CLINICAL DATA:  Ileus. EXAM: PORTABLE ABDOMEN - 1 VIEW COMPARISON:  04/15/2020, earlier same day FINDINGS: Contrast material is seen in the gastric fundus and diffusely in the colon. Associated diffuse gaseous small bowel distension is similar to prior. Left sacral stimulator device again noted. Visualized bony anatomy unremarkable. IMPRESSION: Diffuse gaseous distention of small bowel and colon with contrast material noted throughout the colonic lumen on today's study. Electronically Signed   By: Kennith Center  M.D.   On: 04/15/2020 08:45   DG Abd Portable 1V-Small Bowel Obstruction Protocol-initial, 8 hr delay  Result Date: 04/15/2020 CLINICAL DATA:  Small-bowel obstruction image.  8 hour delay. EXAM: PORTABLE ABDOMEN - 1 VIEW. The upper abdomen is collimated off view. COMPARISON:  CT abdomen pelvis 04/13/2020 FINDINGS: The upper abdomen is collimated off view with suggestion of the tip of an enteric tube overlying the gastric lumen. PO contrast opacifies the visualized portions of the gastric lumen. No PO contrast noted within the duodenum or distally. Dilated gaseous loops of small bowel within the left mid abdomen gas noted within the colon. Neural stimulator overlying the left iliac bone with leads overlying the pelvis. Surgical clips overlie the pelvis. Bowel anastomotic staples noted overlying the right lower abdomen. IMPRESSION: 1. PO contrast within the gastric lumen. Recommend serial XR abdomen for further evaluation. 2. Small bowel gaseous dilatation and gas within the colon. Findings suggestive of an ileus. Electronically Signed   By: Tish Frederickson M.D.   On: 04/15/2020 02:20   DG Basil Dess Tube Plc W/Fl W/Rad  Result Date: 04/14/2020 CLINICAL DATA:  Gastric and small bowel dilatation and abdominal pain. NG tube placement requested for suction. EXAM: NASO G TUBE PLACEMENT WITH FL AND WITH RAD CONTRAST:  None. FLUOROSCOPY TIME:  Fluoroscopy Time:  0 minutes 12 seconds Radiation Exposure Index (if provided by the fluoroscopic device): 0.9 mGy Number of Acquired Spot Images: 0 COMPARISON:  04/13/2020 CT abdomen/pelvis. FINDINGS: Nasogastric tube advanced without difficulty into the stomach with tip in  the gastric fundus. IMPRESSION: Successful nasogastric tube placement under fluoroscopic guidance with tip in the gastric fundus. Electronically Signed   By: Delbert Phenix M.D.   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

## 2020-04-17 ENCOUNTER — Inpatient Hospital Stay (HOSPITAL_COMMUNITY): Payer: Medicaid Other

## 2020-04-17 LAB — CBC WITH DIFFERENTIAL/PLATELET
Abs Immature Granulocytes: 0 10*3/uL (ref 0.00–0.07)
Basophils Absolute: 0 10*3/uL (ref 0.0–0.1)
Basophils Relative: 0 %
Eosinophils Absolute: 0.1 10*3/uL (ref 0.0–0.5)
Eosinophils Relative: 3 %
HCT: 34.2 % — ABNORMAL LOW (ref 36.0–46.0)
Hemoglobin: 11.1 g/dL — ABNORMAL LOW (ref 12.0–15.0)
Immature Granulocytes: 0 %
Lymphocytes Relative: 41 %
Lymphs Abs: 1.5 10*3/uL (ref 0.7–4.0)
MCH: 29.2 pg (ref 26.0–34.0)
MCHC: 32.5 g/dL (ref 30.0–36.0)
MCV: 90 fL (ref 80.0–100.0)
Monocytes Absolute: 0.3 10*3/uL (ref 0.1–1.0)
Monocytes Relative: 7 %
Neutro Abs: 1.8 10*3/uL (ref 1.7–7.7)
Neutrophils Relative %: 49 %
Platelets: 161 10*3/uL (ref 150–400)
RBC: 3.8 MIL/uL — ABNORMAL LOW (ref 3.87–5.11)
RDW: 14.8 % (ref 11.5–15.5)
WBC: 3.6 10*3/uL — ABNORMAL LOW (ref 4.0–10.5)
nRBC: 0 % (ref 0.0–0.2)

## 2020-04-17 LAB — RENAL FUNCTION PANEL
Albumin: 3.4 g/dL — ABNORMAL LOW (ref 3.5–5.0)
Anion gap: 6 (ref 5–15)
BUN: <5 mg/dL — ABNORMAL LOW (ref 6–20)
CO2: 21 mmol/L — ABNORMAL LOW (ref 22–32)
Calcium: 8.9 mg/dL (ref 8.9–10.3)
Chloride: 106 mmol/L (ref 98–111)
Creatinine, Ser: 0.71 mg/dL (ref 0.44–1.00)
GFR, Estimated: >60 mL/min (ref >60)
Glucose, Bld: 118 mg/dL — ABNORMAL HIGH (ref 70–99)
Phosphorus: 3.4 mg/dL (ref 2.5–4.6)
Potassium: 3.8 mmol/L (ref 3.5–5.1)
Sodium: 133 mmol/L — ABNORMAL LOW (ref 135–145)

## 2020-04-17 LAB — GLUCOSE, CAPILLARY
Glucose-Capillary: 109 mg/dL — ABNORMAL HIGH (ref 70–99)
Glucose-Capillary: 106 mg/dL — ABNORMAL HIGH (ref 70–99)

## 2020-04-17 LAB — MAGNESIUM: Magnesium: 1.8 mg/dL (ref 1.7–2.4)

## 2020-04-17 MED ORDER — KETOROLAC TROMETHAMINE 30 MG/ML IJ SOLN
30.0000 mg | Freq: Once | INTRAMUSCULAR | Status: AC
  Administered 2020-04-17: 30 mg via INTRAVENOUS
  Filled 2020-04-17: qty 1

## 2020-04-17 MED ORDER — MAGNESIUM SULFATE 4 GM/100ML IV SOLN
4.0000 g | Freq: Once | INTRAVENOUS | Status: AC
  Administered 2020-04-17: 4 g via INTRAVENOUS
  Filled 2020-04-17: qty 100

## 2020-04-17 MED ORDER — POLYETHYLENE GLYCOL 3350 17 G PO PACK
17.0000 g | PACK | Freq: Every day | ORAL | Status: DC
  Administered 2020-04-17 – 2020-04-18 (×2): 17 g via ORAL
  Filled 2020-04-17 (×3): qty 1

## 2020-04-17 MED ORDER — METOPROLOL TARTRATE 5 MG/5ML IV SOLN
2.5000 mg | Freq: Two times a day (BID) | INTRAVENOUS | Status: DC
  Administered 2020-04-17: 2.5 mg via INTRAVENOUS
  Filled 2020-04-17: qty 5

## 2020-04-17 MED ORDER — DIPHENHYDRAMINE HCL 50 MG/ML IJ SOLN
12.5000 mg | Freq: Once | INTRAMUSCULAR | Status: AC
  Administered 2020-04-17: 12.5 mg via INTRAVENOUS
  Filled 2020-04-17: qty 1

## 2020-04-17 MED ORDER — PROCHLORPERAZINE EDISYLATE 10 MG/2ML IJ SOLN
5.0000 mg | Freq: Once | INTRAMUSCULAR | Status: AC
  Administered 2020-04-17: 5 mg via INTRAVENOUS
  Filled 2020-04-17: qty 2

## 2020-04-17 NOTE — Progress Notes (Signed)
   Subjective/Chief Complaint: SOME BLOATING BUT FEELS WELL THIS AM Wants to try more to eat  Significant other in bed with her  No vomiting passing flatus    Objective: Vital signs in last 24 hours: Temp:  [97.9 F (36.6 C)-98.1 F (36.7 C)] 98.1 F (36.7 C) (10/17 0619) Pulse Rate:  [56-69] 56 (10/17 0619) Resp:  [16] 16 (10/17 0619) BP: (101-107)/(68-72) 101/68 (10/17 0619) SpO2:  [98 %] 98 % (10/17 0619)    Intake/Output from previous day: 10/16 0701 - 10/17 0700 In: 1757.2 [P.O.:480; I.V.:877.2; IV Piggyback:400] Out: 3200 [Urine:3200] Intake/Output this shift: No intake/output data recorded.   General: pleasant, WD,thin female who is laying in bed in NAD Heart: regular, rate, and rhythm. Palpable radial and pedal pulses bilaterally Lungs: CTAB, no wheezes, rhonchi, or rales noted. Respiratory effort nonlabored Abd: soft,mild diffuse ttp,distended, +BS,ileal conduit present Lab Results:  Recent Labs    04/16/20 0736 04/17/20 0632  WBC 3.8* 3.6*  HGB 10.4* 11.1*  HCT 33.5* 34.2*  PLT 152 161   BMET Recent Labs    04/16/20 0736 04/17/20 0632  NA 138 133*  K 4.6 3.8  CL 111 106  CO2 22 21*  GLUCOSE 85 118*  BUN <5* <5*  CREATININE 0.69 0.71  CALCIUM 8.9 8.9   PT/INR No results for input(s): LABPROT, INR in the last 72 hours. ABG No results for input(s): PHART, HCO3 in the last 72 hours.  Invalid input(s): PCO2, PO2  Studies/Results: DG Abd Portable 1V  Result Date: 04/15/2020 CLINICAL DATA:  Ileus. EXAM: PORTABLE ABDOMEN - 1 VIEW COMPARISON:  04/15/2020, earlier same day FINDINGS: Contrast material is seen in the gastric fundus and diffusely in the colon. Associated diffuse gaseous small bowel distension is similar to prior. Left sacral stimulator device again noted. Visualized bony anatomy unremarkable. IMPRESSION: Diffuse gaseous distention of small bowel and colon with contrast material noted throughout the colonic lumen on today's  study. Electronically Signed   By: Kennith Center M.D.   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

## 2020-04-17 NOTE — Progress Notes (Signed)
PROGRESS NOTE    [name]  EXN:[id number] DOB: [birth year] DOA: 04/13/2020 PCP: [name], PA-C   Chief Complaint  Patient presents with  . Abdominal Pain    Brief Narrative:  HPI per Dr. [name] is a 29 y.o. 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  Has had a bowel movement about 8 hours prior to coming to the ER.  No vomiting or diarrhea denies any fever chills.  Pain is persistent.  ED Course: In the ER patient was afebrile and CT abdomen pelvis done without contrast shows features concerning for possible ileus versus developing bowel obstruction.  Patient has not had any vomiting in the ER.  Pain improved with pain relief medications.  Covid test was negative labs are significant for mild hypokalemia I sensitive troponins were negative EKG shows normal sinus rhythm.  UA shows features concerning for UTI.  Assessment & Plan:   Principal Problem:   SBO (small bowel obstruction) (HCC) Active Problems:   Abdominal pain   H/O Graves' disease   Sjogren's disease (HCC)   Migraine with aura   Chronic interstitial cystitis with hematuria   Hypokalemia   Hypomagnesemia   Acute lower UTI   Small bowel obstruction (HCC)  1 abdominal pain secondary to small bowel obstruction. Patient presented with epigastric abdominal pain and left-sided periumbilical abdominal pain.  Patient with prior history of and concerning for adhesions as the etiology of probable small bowel obstruction.  Patient with nausea.  No emesis.  NG tube placement was tried 3 separate occasions on admission  with no success.  NG tube placement tried by general surgery with no success.  NG tube subsequently placed under fluoroscopy (04/14/2020), however the evening of 04/14/2020 patient with emesis, patient dislodged/pulled out NG tube as she felt it was called in her throat.  Patient with some improvement with abdominal distention and abdominal pain.  Noted to have liquid stool 2 days ago.  No emesis.  Some improvement with nausea.  Tolerated full liquid diet.  Diet has been advanced to a soft diet per general surgery.  Abdominal films obtained this morning with small bowel and colon remaining diffusely gas distended to the rectum, appearance of abdomen suspicious for new pneumoperitoneum with regular sign observed throughout the abdomen and pelvis, no definitive subdiaphragmatic free air on erect view.  Case discussed with general surgery, Dr. Magnus Ivan who discussed case with rounding surgeon, Dr. Luisa Hart and it was felt that patient's abdomen was fairly benign clinically with normal vital signs and tolerating diet and recommended to follow clinically and if patient decompensates with tachycardia, worsening pain will need a CT abdomen and pelvis.  Supportive care for now.  Keep potassium >4, keep magnesium  >2.  Continue mobilization.  Per general surgery.  2.  Probable UTI Urine cultures with multiple species.  Status post 3 days IV ciprofloxacin.   3.  History of migraines Patient noted to be on propranolol for prophylaxis.  Patient placed on IV metoprolol to prevent beta-blocker withdrawal.  Patient with some bradycardia however asymptomatic.  Decrease IV metoprolol to twice daily.  Once tolerating oral intake and resolution of small bowel obstruction will  place back on home dose oral beta-blocker.  4.  History of Graves' disease status post ablation IV Synthroid.    5.  History of lupus/Sjogren's syndrome Continue to hold Plaquenil.  6.  History of chronic interstitial cystitis status post cystectomy  and urostomy Urine cultures with multiple species.  Status post 3 days IV ciprofloxacin.    7.  History of bowel resection for necrotizing enterocolitis as an infant was on TPN until age 66.  8.  Hypokalemia/hypomagnesemia Potassium at 3.8.  Magnesium at 1.8.  Phosphorus at 3.4.  Magnesium sulfate 4 g IV x1.  Follow.    DVT prophylaxis: SCDs Code Status: Full Family Communication: Updated patient.  Significant other at bedside asleep.  Disposition:   Status is: Inpatient    Dispo: The patient is from: Home              Anticipated d/c is to: Home              Anticipated d/c date is: 1 to 2 days days.               Patient currently with small bowel obstruction, diet being advanced, not stable for discharge.  General surgery following.        Consultants:   General surgery: Dr. Freida Busman 04/14/2020  Procedures:   CT abdomen and pelvis 04/13/2020  Chest x-ray 04/13/2020  SBO protocol  Abdominal films 04/17/2020  Antimicrobials:   IV ciprofloxacin 04/14/2020 >>>>> 04/16/2020   Subjective: Patient sitting up in bed.  Significant other asleep in chair next to patient.  Patient denies any chest pain.  No shortness of breath.  No emesis.  Tolerated full liquid diet.  No bowel movement today.  No flatus today.  Overall feeling better than she did.  Stated ambulated in the hallways yesterday.    Objective: Vitals:   04/15/20 2136 04/16/20 0659 04/16/20 2050 04/17/20 0619  BP: 103/68 108/75 107/72 101/68  Pulse: (!) 56 (!) 45 69 (!) 56  Resp:  18 16 16   Temp: 97.9 F (36.6 C) 97.8 F (36.6 C) 97.9 F (36.6 C) 98.1 F (36.7 C)  TempSrc: Oral Oral Oral Oral  SpO2: 99% 100% 98% 98%  Weight:      Height:        Intake/Output Summary (Last 24 hours) at 04/17/2020 1104 Last data filed at 04/16/2020 2100 Gross per 24 hour  Intake 1757.23 ml  Output 3200 ml  Net -1442.77 ml   Filed Weights   04/13/20 1815 04/14/20 0216  Weight: 45 kg 45.9 kg     Examination:  General exam: NAD Respiratory system: CTAB.  No wheezes, no crackles, no rhonchi.  Normal respiratory effort.  Cardiovascular system: Bradycardia.  No murmurs rubs or gallops.  No JVD.  No lower extremity edema.  Gastrointestinal system: Abdomen is soft, mildly distended, some tenderness to palpation in the epigastrium and left periumbilical region which is improving.  Hypoactive bowel sounds.  No rebound.  No guarding.  Urostomy intact with yellow clear urine.   Central nervous system: Alert and oriented. No focal neurological deficits. Extremities: Symmetric 5 x 5 power. Skin: No rashes, lesions or ulcers Psychiatry: Judgement and insight appear normal. Mood & affect appropriate.     Data Reviewed: I have personally reviewed following labs and imaging studies  CBC: Recent Labs  Lab 04/13/20 1831 04/14/20 0557 04/15/20 0448 04/16/20 0736 04/17/20 0632  WBC 5.0 4.8 4.5 3.8* 3.6*  NEUTROABS  --  3.4 3.2  --  1.8  HGB 12.2 10.7* 10.7* 10.4* 11.1*  HCT 37.7 33.0* 33.7* 33.5* 34.2*  MCV 89.8 89.2 90.1 91.5 90.0  PLT 207 152 157 152 161    Basic Metabolic Panel: Recent Labs  Lab 04/13/20 1831 04/14/20 0557 04/15/20 0448 04/16/20 0736 04/17/20 0632  NA 143 137 137 138 133*  K 3.3* 3.2* 3.7 4.6 3.8  CL 107 108 111 111 106  CO2 23 21* 19* 22 21*  GLUCOSE 106* 126* 103* 85 118*  BUN 11 9 <5* <5* <5*  CREATININE 0.68 0.71 0.63 0.69 0.71  CALCIUM 9.9 8.8* 8.1* 8.9 8.9  MG  --  1.7 2.2 2.2 1.8  PHOS  --   --  2.8 2.5 3.4    GFR: Estimated Creatinine Clearance: 73.2 mL/min (by C-G formula based on SCr of 0.71 mg/dL).  Liver Function Tests: Recent Labs  Lab 04/13/20 1831 04/14/20 0557 04/15/20 0448 04/16/20 0736 04/17/20 0632  AST 21 21  --   --   --   ALT 16 16  --   --   --   ALKPHOS 70 61  --   --   --   BILITOT 2.1* 1.9*  --   --   --   PROT 8.0 6.3*  --   --   --   ALBUMIN 4.2 3.4* 3.2* 3.2* 3.4*    CBG: Recent Labs  Lab  04/16/20 1243 04/16/20 1642 04/16/20 1646 04/16/20 2351 04/17/20 0621  GLUCAP 98 96 116* 93 109*     Recent Results (from the past 240 hour(s))  Urine culture     Status: Abnormal   Collection Time: 04/14/20 12:03 AM   Specimen: Urine, Clean Catch  Result Value Ref Range Status   Specimen Description   Final    URINE, CLEAN CATCH Performed at [facility], [address] [place], [place] [address]    Special Requests   Final    NONE Performed at [facility], [address] [place], [place] [address]    Culture MULTIPLE SPECIES PRESENT, SUGGEST RECOLLECTION (A)  Final   Report Status 04/15/2020 FINAL  Final  Respiratory Panel by RT PCR (Flu A&B, Covid) - Nasopharyngeal Swab     Status: None   Collection Time: 04/14/20 12:46 AM   Specimen: Nasopharyngeal Swab  Result Value Ref Range Status   SARS Coronavirus 2 by RT PCR NEGATIVE NEGATIVE Final    Comment: (NOTE) SARS-CoV-2 target nucleic acids are NOT DETECTED.  The SARS-CoV-2 RNA is generally detectable in upper respiratoy specimens during the acute phase of infection. The lowest concentration of SARS-CoV-2 viral copies this assay can detect is 131 copies/mL. A negative result does not preclude SARS-Cov-2 infection and should not be used as the sole basis for treatment or other patient management decisions. A negative result may occur with  improper specimen collection/handling, submission of specimen other than nasopharyngeal swab, presence of viral mutation(s) within the areas targeted by this assay, and inadequate number of viral copies (<131 copies/mL). A negative result must be combined with clinical observations, patient history, and epidemiological information. The expected result is Negative.  Fact Sheet for Patients:  https://www.moore.com/https://www.fda.gov/media/142436/download  Fact Sheet for Healthcare Providers:  https://www.young.biz/https://www.fda.gov/media/142435/download  This test is no t yet  approved or cleared by the [place] FDA and  has been authorized for detection and/or diagnosis of SARS-CoV-2 by FDA under an Emergency Use Authorization (EUA). This EUA will remain  in effect (meaning this test can be  used) for the duration of the COVID-19 declaration under Section 564(b)(1) of the Act, 21 U.S.C. section 360bbb-3(b)(1), unless the authorization is terminated or revoked sooner.     Influenza A by PCR NEGATIVE NEGATIVE Final   Influenza B by PCR NEGATIVE NEGATIVE Final    Comment: (NOTE) The Xpert Xpress SARS-CoV-2/FLU/RSV assay is intended as an aid in  the diagnosis of influenza from Nasopharyngeal swab specimens and  should not be used as a sole basis for treatment. Nasal washings and  aspirates are unacceptable for Xpert Xpress SARS-CoV-2/FLU/RSV  testing.  Fact Sheet for Patients: https://www.moore.com/  Fact Sheet for Healthcare Providers: https://www.young.biz/  This test is not yet approved or cleared by the [place] FDA and  has been authorized for detection and/or diagnosis of SARS-CoV-2 by  FDA under an Emergency Use Authorization (EUA). This EUA will remain  in effect (meaning this test can be used) for the duration of the  Covid-19 declaration under Section 564(b)(1) of the Act, 21  U.S.C. section 360bbb-3(b)(1), unless the authorization is  terminated or revoked. Performed at [facility], [address], [place], [place] [address]          Radiology Studies: No results found.      Scheduled Meds: . levothyroxine  87.5 mcg Intravenous Daily  . metoprolol tartrate  2.5 mg Intravenous Q8H  . polyethylene glycol  17 g Oral Daily   Continuous Infusions: . dextrose 5 % and 0.9% NaCl 75 mL/hr at 04/17/20 0541  . magnesium sulfate bolus IVPB 4 g (04/17/20 1040)     LOS: 3 days    Time spent: 35 minutes     Ramiro Harvest, MD [facility]   To contact  the attending provider between 7A-7P or the covering provider during after hours 7P-7A, please log into the web site [email or web address] and access using universal [place] password for that web site. If you do not have the password, please call the hospital operator.  04/17/2020, 11:04 AM

## 2020-04-17 NOTE — Progress Notes (Signed)
Patient ID: [name], female   DOB: 1990-08-24, 29 y.o.   MRN: [id number]   I discussed the Xray findings with Dr. Luisa Hart.  Clinically her abdomin is fairly benign and her vitals are normal.  She is also tolerating a diet. Will follow clinically and is she has changing pain, tachycardia, etc, will get a CT scan.

## 2020-04-18 ENCOUNTER — Inpatient Hospital Stay (HOSPITAL_COMMUNITY): Payer: Medicaid Other

## 2020-04-18 LAB — RENAL FUNCTION PANEL
Albumin: 3 g/dL — ABNORMAL LOW (ref 3.5–5.0)
Anion gap: 6 (ref 5–15)
BUN: <5 mg/dL — ABNORMAL LOW (ref 6–20)
CO2: 21 mmol/L — ABNORMAL LOW (ref 22–32)
Calcium: 8.3 mg/dL — ABNORMAL LOW (ref 8.9–10.3)
Chloride: 111 mmol/L (ref 98–111)
Creatinine, Ser: 0.72 mg/dL (ref 0.44–1.00)
GFR, Estimated: >60 mL/min (ref >60)
Glucose, Bld: 95 mg/dL (ref 70–99)
Phosphorus: 4 mg/dL (ref 2.5–4.6)
Potassium: 3.9 mmol/L (ref 3.5–5.1)
Sodium: 138 mmol/L (ref 135–145)

## 2020-04-18 LAB — CBC
HCT: 31.9 % — ABNORMAL LOW (ref 36.0–46.0)
Hemoglobin: 10 g/dL — ABNORMAL LOW (ref 12.0–15.0)
MCH: 28.4 pg (ref 26.0–34.0)
MCHC: 31.3 g/dL (ref 30.0–36.0)
MCV: 90.6 fL (ref 80.0–100.0)
Platelets: 152 10*3/uL (ref 150–400)
RBC: 3.52 MIL/uL — ABNORMAL LOW (ref 3.87–5.11)
RDW: 14.7 % (ref 11.5–15.5)
WBC: 3.3 10*3/uL — ABNORMAL LOW (ref 4.0–10.5)
nRBC: 0 % (ref 0.0–0.2)

## 2020-04-18 LAB — CORTISOL: Cortisol, Plasma: 6.3 ug/dL

## 2020-04-18 LAB — MAGNESIUM: Magnesium: 2.3 mg/dL (ref 1.7–2.4)

## 2020-04-18 MED ORDER — KETOROLAC TROMETHAMINE 30 MG/ML IJ SOLN
30.0000 mg | Freq: Once | INTRAMUSCULAR | Status: AC
  Administered 2020-04-18: 30 mg via INTRAVENOUS
  Filled 2020-04-18: qty 1

## 2020-04-18 MED ORDER — PROCHLORPERAZINE EDISYLATE 10 MG/2ML IJ SOLN
5.0000 mg | Freq: Once | INTRAMUSCULAR | Status: AC
  Administered 2020-04-18: 5 mg via INTRAVENOUS
  Filled 2020-04-18: qty 2

## 2020-04-18 MED ORDER — POLYETHYLENE GLYCOL 3350 17 G PO PACK: 17.0000 g | PACK | Freq: Every day | ORAL | 0 refills | Status: DC

## 2020-04-18 MED ORDER — DIPHENHYDRAMINE HCL 50 MG/ML IJ SOLN
12.5000 mg | Freq: Once | INTRAMUSCULAR | Status: AC
  Administered 2020-04-18: 12.5 mg via INTRAVENOUS
  Filled 2020-04-18: qty 1

## 2020-04-18 NOTE — Final Consult Note (Signed)
Consultant Final Sign-Off Note    Assessment/Final recommendations  [name] is a 29 y.o. female followed by me for SBO. Patient now on a soft diet and having bowel function. KUB today showed normal bowel gas pattern.    Wound care (if applicable): N/A   Diet at discharge: soft diet and advance slowly to regular at home   Activity at discharge: ad lib   Follow-up appointment:  Follow up with PCP   Pending results:  Unresulted Labs (From admission, onward)         None       Medication recommendations: continue colace and miralax at home daily or as needed    Other recommendations: N/A    Thank you for allowing [place] to participate in the care of your patient!  Please consult [place] again if you have further needs for your patient.  [name] 04/18/2020 10:49 AM    Subjective  She did report some bloating this AM but is trying to walk to get things moving. +flatus today. Denies pain or nausea.   Objective  Vital signs in last 24 hours: Temp:  [97.9 F (36.6 C)-98.1 F (36.7 C)] 98.1 F (36.7 C) (10/18 0500) Pulse Rate:  [67-75] 68 (10/18 0500) Resp:  [16] 16 (10/18 0500) BP: (91-109)/(58-75) 94/60 (10/18 0834) SpO2:  [97 %-99 %] 99 % (10/18 0500)  Physical Exam  General: pleasant, WD,thin female who is ambulating around room  Heart: regular, rate, and rhythm. Palpable radial and pedal pulses bilaterally Lungs:Respiratory effort nonlabored Abd: mildly distended,ileal conduit present   Pertinent labs and Studies: Recent Labs    04/16/20 0736 04/17/20 0632 04/18/20 0530  WBC 3.8* 3.6* 3.3*  HGB 10.4* 11.1* 10.0*  HCT 33.5* 34.2* 31.9*   BMET Recent Labs    04/17/20 0632 04/18/20 0530  NA 133* 138  K 3.8 3.9  CL 106 111  CO2 21* 21*  GLUCOSE 118* 95  BUN <5* <5*  CREATININE 0.71 0.72  CALCIUM 8.9 8.3*   No results for input(s): LABURIN in the last 72 hours. Results for orders placed or performed during the hospital encounter  of 04/13/20  Urine culture     Status: Abnormal   Collection Time: 04/14/20 12:03 AM   Specimen: Urine, Clean Catch  Result Value Ref Range Status   Specimen Description   Final    URINE, CLEAN CATCH Performed at [facility], [address] [place], [place] [address]    Special Requests   Final    NONE Performed at [facility], [address], [place], [place] [address]    Culture MULTIPLE SPECIES PRESENT, SUGGEST RECOLLECTION (A)  Final   Report Status 04/15/2020 FINAL  Final  Respiratory Panel by RT PCR (Flu A&B, Covid) - Nasopharyngeal Swab     Status: None   Collection Time: 04/14/20 12:46 AM   Specimen: Nasopharyngeal Swab  Result Value Ref Range Status   SARS Coronavirus 2 by RT PCR NEGATIVE NEGATIVE Final    Comment: (NOTE) SARS-CoV-2 target nucleic acids are NOT DETECTED.  The SARS-CoV-2 RNA is generally detectable in upper respiratoy specimens during the acute phase of infection. The lowest concentration of SARS-CoV-2 viral copies this assay can detect is 131 copies/mL. A negative result does not preclude SARS-Cov-2 infection and should not be used as the sole basis for treatment or other patient management decisions. A negative result may occur with  improper specimen collection/handling, submission of specimen other than nasopharyngeal swab,  presence of viral mutation(s) within the areas targeted by this assay, and inadequate number of viral copies (<131 copies/mL). A negative result must be combined with clinical observations, patient history, and epidemiological information. The expected result is Negative.  Fact Sheet for Patients:  https://www.moore.com/  Fact Sheet for Healthcare Providers:  https://www.young.biz/  This test is no t yet approved or cleared by the [place] FDA and  has been authorized for detection and/or diagnosis of SARS-CoV-2 by FDA under an Emergency Use  Authorization (EUA). This EUA will remain  in effect (meaning this test can be used) for the duration of the COVID-19 declaration under Section 564(b)(1) of the Act, 21 U.S.C. section 360bbb-3(b)(1), unless the authorization is terminated or revoked sooner.     Influenza A by PCR NEGATIVE NEGATIVE Final   Influenza B by PCR NEGATIVE NEGATIVE Final    Comment: (NOTE) The Xpert Xpress SARS-CoV-2/FLU/RSV assay is intended as an aid in  the diagnosis of influenza from Nasopharyngeal swab specimens and  should not be used as a sole basis for treatment. Nasal washings and  aspirates are unacceptable for Xpert Xpress SARS-CoV-2/FLU/RSV  testing.  Fact Sheet for Patients: https://www.moore.com/  Fact Sheet for Healthcare Providers: https://www.young.biz/  This test is not yet approved or cleared by the [place] FDA and  has been authorized for detection and/or diagnosis of SARS-CoV-2 by  FDA under an Emergency Use Authorization (EUA). This EUA will remain  in effect (meaning this test can be used) for the duration of the  Covid-19 declaration under Section 564(b)(1) of the Act, 21  U.S.C. section 360bbb-3(b)(1), unless the authorization is  terminated or revoked. Performed at [facility], [address] [place], [place] [address]     Imaging: DG Abd 2 Views  Result Date: 04/18/2020 CLINICAL DATA:  Small bowel obstruction. EXAM: ABDOMEN - 2 VIEW COMPARISON:  April 17, 2020. FINDINGS: The bowel gas pattern is normal. There is no evidence of free air. No radio-opaque calculi or other significant radiographic abnormality is seen. IMPRESSION: No evidence of bowel obstruction or ileus. Electronically Signed   By: Lupita Raider M.D.   On: 04/18/2020 10:00

## 2020-04-18 NOTE — Evaluation (Signed)
Physical Therapy Evaluation Patient Details Name: [name] MRN: [id number] DOB: [birth year] Today's Date: 04/18/2020   History of Present Illness  [name] is a 29 y.o. 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 Patient admitted for possible ileus versus developing bowel obstruction.    Clinical Impression  [name] is 29 y.o. female admitted with above HPI and diagnosis. Patient is currently limited by functional impairments below (see PT problem list). Patient is independent at baseline. Patient evaluated by Physical Therapy with no further acute PT needs identified. She is independent with mobility and vestibular testing did not reveal any acute vestibular impairment. Patient does have interesting history of recurrent vertigo episodes with hearing changes. She reports loud sounds being provoking as well as exercise and dizziness was exacerbated with hyperventilation test however no nystagmus was observed. All education has been completed and the patient has no further questions. She will benefit from ENT follow up and OPPT for further vestibular evaluation. See below for any follow-up Physical Therapy or equipment needs. PT is signing off. Thank you for this referral.     Follow Up Recommendations Outpatient PT;Other (comment) (ENT follow up and OPPT for vestibular evaluation)    Equipment Recommendations  None recommended by PT    Recommendations for Other Services       Precautions / Restrictions Precautions Precautions: Fall Restrictions Weight Bearing Restrictions: No        Vestibular Assessment - 04/18/20 0001      Vestibular Assessment   General Observation No acute distress, pt moving smoothly throughout evaluation session.       Symptom Behavior   Subjective history of current problem Pt reports chronic dizziness over the last decade that is intermittent. She states slight "light/wavy" sensation always, and intermittetn spells of more severe dizziness ~ 1x/week. pt reports reduced/impaired hearing and headaches with dizziness.    Type of Dizziness  Imbalance;Spinning;"Funny feeling in head";"World moves";Vertigo    Frequency of Dizziness constant with ~1x/week spell of spinning     Duration of Dizziness pt reports exacerbations last ~ 1 day    Symptom Nature Positional    Relieving Factors --   laying on side Rt or LT   History of similar episodes Pt reports most recent exacerbation was ~ last Tuesday and it felt like the world was spinning and she was having trouble hearing. Pt reports these episodes have been recurrent over the last 2 years and include a headache and hearing changes. pt states she also becomes dizzy when rising from a low position.       Oculomotor Exam   Oculomotor Alignment Normal    Ocular ROM WNL    Spontaneous Absent    Gaze-induced  Absent    Head shaking Horizontal Absent    Head Shaking Vertical Absent    Smooth Pursuits Intact    Saccades Hypometric    Comment Hypometic only for Rt downward gaze      Oculomotor Exam-Fixation Suppressed    Gaze evoked nystagmus --    Left Head Impulse --    Right Head Impulse --      Vestibulo-Ocular Reflex   Comment --  Other Tests   Hyperventilation pt reported increase in dizziness, unable to complete for 2 minutes and no nystagmus noted.       Positional Testing   Dix-Hallpike Dix-Hallpike Right;Dix-Hallpike Left      Dix-Hallpike Right   Dix-Hallpike Right Symptoms No nystagmus      Dix-Hallpike Left   Dix-Hallpike Left Symptoms No nystagmus      Cognition   Cognition  Orientation Level Appropriate for developmental age;Oriented x 4      Positional Sensitivities   Head Turning x 5 Moderate dizziness    Positional Sensitivities Comments Pt with difficulty with horizontal head rotation, no difficulty with vertical motion.      Orthostatics   BP sitting 102/79    BP standing (after 1 minute) 109/87           Balance Overall balance assessment: No apparent balance deficits (not formally assessed)                                           Pertinent Vitals/Pain Pain Assessment: No/denies pain    Home Living Family/patient expects to be discharged to:: Private residence                      Prior Function Level of Independence: Independent               Hand Dominance        Extremity/Trunk Assessment   Upper Extremity Assessment Upper Extremity Assessment: Overall WFL for tasks assessed    Lower Extremity Assessment Lower Extremity Assessment: Overall WFL for tasks assessed    Cervical / Trunk Assessment Cervical / Trunk Assessment: Normal  Communication   Communication: No difficulties  Cognition Arousal/Alertness: Awake/alert Behavior During Therapy: WFL for tasks assessed/performed Overall Cognitive Status: Within Functional Limits for tasks assessed                                        General Comments      Exercises     Assessment/Plan    PT Assessment All further PT needs can be met in the next venue of care  PT Problem List Decreased balance;Decreased mobility       PT Treatment Interventions      PT Goals (Current goals can be found in the Care Plan section)  Acute Rehab PT Goals Patient Stated Goal: stop feeling dizzy PT Goal Formulation: All assessment and education complete, DC therapy Time For Goal Achievement: 04/25/20 Potential to Achieve Goals: Good    Frequency     Barriers to discharge        Co-evaluation               AM-PAC PT "6  Clicks" Mobility  Outcome Measure Help needed turning from your back to your side while in a flat bed without using bedrails?: None Help needed moving from lying on your back to sitting on the side of a flat bed without using bedrails?: None Help needed moving to and from a bed to a chair (including a wheelchair)?: None Help needed standing up from a chair using your arms (e.g., wheelchair or bedside chair)?: None Help needed to walk in hospital room?: None Help needed climbing 3-5 steps with a railing? : A Little 6 Click  Score: 23    End of Session   Activity Tolerance: Patient tolerated treatment well Patient left: in chair;with call bell/phone within reach Nurse Communication: Mobility status PT Visit Diagnosis: Dizziness and giddiness (R42);Unsteadiness on feet (R26.81)    Time: 6579-0383 PT Time Calculation (min) (ACUTE ONLY): 35 min   Charges:   PT Evaluation $PT Eval Low Complexity: 1 Low PT Treatments $Physical Performance Test: 8-22 mins      Verner Mould, DPT Acute Rehabilitation Services  Office [phone] Pager [phone]  04/18/2020 3:27 PM

## 2020-04-18 NOTE — Discharge Summary (Signed)
Physician Discharge Summary  [name] NWG:[id number] DOB: 08/02/1990 DOA: 04/13/2020  PCP: [name], PA-C  Admit date: 04/13/2020 Discharge date: 04/18/2020  Time spent: 50 minutes  Recommendations for Outpatient Follow-up:  1. Follow-up with [name], PA-C in 1 to 2 weeks.  On follow-up patient will need a basic metabolic profile and a magnesium level checked to follow-up on electrolytes and renal function.  Patient's chronic dizziness will need to be reassessed and patient may benefit from referral to outpatient ENT for further evaluation and management.   Discharge Diagnoses:  Principal Problem:   SBO (small bowel obstruction) (HCC) Active Problems:   Abdominal pain   H/O Graves' disease   Sjogren's disease (HCC)   Migraine with aura   Chronic interstitial cystitis with hematuria   Hypokalemia   Hypomagnesemia   Acute lower UTI   Small bowel obstruction (HCC)   Discharge Condition: Stable and improved  Diet recommendation: Soft diet with slow advancement to a regular diet at home.  Filed Weights   04/13/20 1815 04/14/20 0216  Weight: 45 kg 45.9 kg    History of present illness:  HPI per Dr. [name] is a 29 y.o. 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  Has had a bowel movement about 8 hours prior to coming to the ER.  No vomiting or diarrhea denies any fever chills.  Pain was persistent.  ED Course: In the ER patient was afebrile and CT abdomen pelvis done without contrast shows features concerning for possible ileus versus developing bowel  obstruction.  Patient has not had any vomiting in the ER.  Pain improved with pain relief medications.  Covid test was negative labs are significant for mild hypokalemia I sensitive troponins were negative EKG shows normal sinus rhythm.  UA shows features concerning for UTI.   Hospital Course:  1 abdominal pain secondary to small bowel obstruction. Patient presented with epigastric abdominal pain and left-sided periumbilical abdominal pain.  Patient with prior history of and concerning for adhesions as the etiology of probable small bowel obstruction.  Patient with nausea.  No emesis.  NG tube placement was tried 3 separate occasions on admission with no success.  NG tube placement tried by general surgery with no success.  NG tube subsequently placed under fluoroscopy (04/14/2020), however the evening of 04/14/2020 patient with emesis, patient dislodged/pulled out NG tube as she felt it was called in her throat.  Patient was placed on bowel rest, mobilization, conservative treatment.  Patient improved slowly during the hospitalization was started on clear liquids which she tolerated diet advanced to full liquid diet which she tolerated and subsequently a soft diet.  Electrolytes were repleted and potassium kept >4, and magnesium> 2.  Patient started to have bowel movements, passing flatus, improvement with abdominal distention and abdominal pain.  Patient underwent SBO protocol during the hospitalization and was followed by general surgery throughout.  Patient subsequently started on MiraLAX daily which he tolerated.  Repeat abdominal films on day of discharge showed resolution of small bowel obstruction.  Patient was cleared by general surgery for discharge with outpatient follow-up with PCP.  Patient instructed to continue soft diet with  gradual advancement to a regular diet at home.  2.  Probable UTI Urine cultures with multiple species.  Status post 3 days IV ciprofloxacin.   3.  History of  migraines Patient noted to be on propranolol for prophylaxis.  Patient placed on IV metoprolol to prevent beta-blocker withdrawal.  Patient with some bradycardia however asymptomatic.    IV metoprolol was decreased.  Patient had some bouts of headache which was treated with IV Compazine, Benadryl or Toradol.  Home regimen propanolol will be resumed on discharge.  Outpatient follow-up with PCP.   4.  History of Graves' disease status post ablation Patient maintained on IV Synthroid during the hospitalization and she was n.p.o.  Home dose oral Synthroid will be resumed on discharge..    5.  History of lupus/Sjogren's syndrome Patient's Plaquenil was held as patient was n.p.o. during the hospitalization will be resumed on discharge.   6.  History of chronic interstitial cystitis status post cystectomy and urostomy Urine cultures with multiple species.  Status post 3 days IV ciprofloxacin.   No further antibiotics needed.   7.  History of bowel resection for necrotizing enterocolitis as an infant was on TPN until age 89.  8.  Hypokalemia/hypomagnesemia Electrolytes were repleted during the hospitalization.  Outpatient follow-up.  9.  Chronic dizziness Patient with some complaints of dizziness that she states has been chronic in nature.  Orthostatics were checked on day of discharge which were negative.  Patient was seen by PT for vestibular evaluation and patient noted NOT to have BPPV.  Was recommended that patient follow-up with PCP in the outpatient setting and may need referral to ENT for further evaluation and possible outpatient vestibular rehab.    Procedures:  CT abdomen and pelvis 04/13/2020  Chest x-ray 04/13/2020  SBO protocol  Abdominal films 04/17/2020, 04/18/2020   Consultations:  General surgery: Dr. Freida BusmanAllen 04/14/2020  Discharge Exam: Vitals:   04/18/20 1153 04/18/20 1156  BP: 113/85 107/83  Pulse: 92 76  Resp:    Temp:    SpO2: 98% 98%    General:  NAD Cardiovascular: RRR Respiratory: CTAB  Discharge Instructions   Discharge Instructions    Diet - low sodium heart healthy   Complete by: As directed    Increase activity slowly   Complete by: As directed      Allergies as of 04/18/2020      Reactions   Penicillins Anaphylaxis   Buprenorphine Hcl Itching   Morphine And Related Itching   Hydrocodone-acetaminophen Rash      Medication List    STOP taking these medications   ciprofloxacin 500 MG tablet Commonly known as: CIPRO   diazepam 10 MG Gel Commonly known as: DIASTAT ACUDIAL     TAKE these medications   Aimovig 70 MG/ML Soaj Generic drug: Erenumab-aooe Inject 70 mg into the skin at bedtime.   AMBULATORY NON FORMULARY MEDICATION Iron Infusions [facility] 3 [facility]   cetirizine 5 MG tablet Commonly known as: ZYRTEC Take 5 mg by mouth daily.   cevimeline 30 MG capsule Commonly known as: EVOXAC Take 1 capsule by mouth 3 (three) times daily as needed.   cyclobenzaprine 10 MG tablet Commonly known as: FLEXERIL Take 1 tablet (10 mg total) by mouth 2 (two) times daily as needed for muscle spasms.   diphenoxylate-atropine 2.5-0.025 MG tablet Commonly known as: Lomotil One tablet twice a day What changed:   how much to take  how to take this  when to take this  reasons to take this  additional instructions   eletriptan 40 MG tablet Commonly known as: Relpax Take 1 tablet (40 mg total) by mouth as needed for migraine or headache. May repeat in 2 hours if headache persists or recurs. What changed: Another medication with the same name was removed. Continue taking this medication, and follow the directions you see here.   EPINEPHrine 0.3 mg/0.3 mL Soaj injection Commonly known as: EPI-PEN Inject 0.3 mg into the muscle as needed for anaphylaxis.   hydroxychloroquine 200 MG tablet Commonly known as: PLAQUENIL Take 400 mg by mouth every morning.    hydroxypropyl methylcellulose / hypromellose 2.5 % ophthalmic solution Commonly known as: ISOPTO TEARS / GONIOVISC Place 2 drops into both eyes 3 (three) times daily as needed for dry eyes.   OMEPRAZOLE PO Take 20 mg by mouth at bedtime.   ondansetron 8 MG disintegrating tablet Commonly known as: ZOFRAN-ODT Take 1 tablet (8 mg total) by mouth every 8 (eight) hours as needed.   polyethylene glycol 17 g packet Commonly known as: MIRALAX / GLYCOLAX Take 17 g by mouth daily. Hold if you develop diarrhea and then use as needed. Start taking on: April 19, 2020   predniSONE 5 MG tablet Commonly known as: DELTASONE Take 1 tablet by mouth daily.   propranolol ER 80 MG 24 hr capsule Commonly known as: Inderal LA Take 1 capsule (80 mg total) by mouth at bedtime.   QUEtiapine 300 MG tablet Commonly known as: SEROQUEL Take 300 mg by mouth at bedtime.   SUMAtriptan 6 MG/0.5ML Soln injection Commonly known as: Imitrex Inject 0.5 mLs (6 mg total) into the skin every 2 (two) hours as needed for migraine or headache. May repeat in 2 hours if headache persists or recurs.   levothyroxine 175 MCG tablet Commonly known as: SYNTHROID Take 175 mcg by mouth daily before breakfast.   Synthroid 100 MCG tablet Generic drug: levothyroxine Take 175 mcg by mouth daily.   tiZANidine 4 MG tablet Commonly known as: ZANAFLEX Take 1 tablet (4 mg total) by mouth every 8 (eight) hours as needed for muscle spasms.   Wh Petrol-Mineral Oil-Lanolin 0.1-0.1 % Oint Place 1 application into both eyes at bedtime.   Xolair 150 MG/ML prefilled syringe Generic drug: omalizumab Inject 150 mg into the skin every 28 (twenty-eight) days.      Allergies  Allergen Reactions  . Penicillins Anaphylaxis  . Buprenorphine Hcl Itching  . Morphine And Related Itching  . Hydrocodone-Acetaminophen Rash    Follow-up Information    [name], PA-C. Schedule an appointment as soon as possible for a visit  in 2 week(s).   Specialty: Physician Assistant Why: f/u in 1-2 weeks. Contact information: [address] [place] [address] [phone]                The results of significant diagnostics from this hospitalization (including imaging, microbiology, ancillary and laboratory) are listed below for reference.    Significant Diagnostic Studies: CT Abdomen Pelvis W Contrast  Result Date: 04/14/2020 CLINICAL DATA:  29 year old female with abdominal pain. EXAM: CT ABDOMEN AND PELVIS WITH CONTRAST TECHNIQUE: Multidetector CT imaging of the abdomen and pelvis was performed using the standard protocol following bolus administration of intravenous contrast. CONTRAST:  44mL OMNIPAQUE IOHEXOL 300 MG/ML  SOLN COMPARISON:  CT abdomen pelvis dated 07/06/2019. FINDINGS: Lower chest: The visualized lung bases are clear. No intra-abdominal free air or free fluid. Hepatobiliary: No focal liver abnormality  is seen. No gallstones, gallbladder wall thickening, or biliary dilatation. Pancreas: Unremarkable. No pancreatic ductal dilatation or surrounding inflammatory changes. Spleen: Normal in size without focal abnormality. Adrenals/Urinary Tract: The adrenal glands unremarkable. Subcentimeter right renal inferior pole hypodense focus is not characterized. There is no hydronephrosis on either side. Status post cystectomy with right lower quadrant urostomy. Stomach/Bowel: Postsurgical changes of bowel with anastomotic sutures. Evaluation of the bowel is limited in the absence of oral contrast. Multiple dilated loops of small bowel throughout the abdomen measuring up to 3.6 cm in caliber. A transition may be present in the right lower quadrant (coronal 38/4 and axial 56/2). CT with oral contrast may provide better evaluation. There is loose stool and air within the colon. Vascular/Lymphatic: The abdominal aorta and IVC unremarkable. No portal venous gas. There is no adenopathy. Reproductive: The uterus and  ovaries are grossly unremarkable. No pelvic mass. Other: Anterior abdominal wall surgical scar. Musculoskeletal: No acute or significant osseous findings. Sacral stimulator noted. IMPRESSION: 1. Dilated loops of small bowel may represent ileus but concerning for small-bowel obstruction with possible transition in the right lower quadrant. CT with oral contrast may provide better evaluation. 2. Status post cystectomy with right lower quadrant urostomy. Electronically Signed   By: Elgie Collard M.D.   On: 04/14/2020 00:05   DG Chest Portable 1 View  Result Date: 04/13/2020 CLINICAL DATA:  Chest pain for several hours EXAM: PORTABLE CHEST 1 VIEW COMPARISON:  11/26/2017 FINDINGS: Cardiac shadow is within normal limits. The lungs are well aerated bilaterally. No focal infiltrate or sizable effusion is seen. No bony abnormality is noted. IMPRESSION: No acute abnormality seen. Electronically Signed   By: Alcide Clever M.D.   On: 04/13/2020 23:06   DG Abd 2 Views  Result Date: 04/18/2020 CLINICAL DATA:  Small bowel obstruction. EXAM: ABDOMEN - 2 VIEW COMPARISON:  April 17, 2020. FINDINGS: The bowel gas pattern is normal. There is no evidence of free air. No radio-opaque calculi or other significant radiographic abnormality is seen. IMPRESSION: No evidence of bowel obstruction or ileus. Electronically Signed   By: Lupita Raider M.D.   On: 04/18/2020 10:00   DG Abd 2 Views  Result Date: 04/17/2020 CLINICAL DATA:  Small bowel obstruction EXAM: ABDOMEN - 2 VIEW COMPARISON:  04/15/2020 FINDINGS: The small bowel and colon remain diffusely gas distended to the rectum. Appearance of the abdomen is suspicious for new pneumoperitoneum with Rigler's sign observed throughout the abdomen and pelvis, however there is no definite subdiaphragmatic free air on erect view. Contrast material in the colon and urinary bladder. Left-sided staple stimulator. IMPRESSION: The small bowel and colon remain diffusely gas distended  to the rectum. Appearance of the abdomen is suspicious for new pneumoperitoneum with Rigler's sign observed throughout the abdomen and pelvis, however there is no definite subdiaphragmatic free air on erect view. Consider CT for further evaluation. These results will be called to the ordering clinician or representative by the Radiologist Assistant, and communication documented in the PACS or [employer]. Electronically Signed   By: Lauralyn Primes M.D.   On: 04/17/2020 11:38   DG Abd Portable 1V  Result Date: 04/15/2020 CLINICAL DATA:  Ileus. EXAM: PORTABLE ABDOMEN - 1 VIEW COMPARISON:  04/15/2020, earlier same day FINDINGS: Contrast material is seen in the gastric fundus and diffusely in the colon. Associated diffuse gaseous small bowel distension is similar to prior. Left sacral stimulator device again noted. Visualized bony anatomy unremarkable. IMPRESSION: Diffuse gaseous distention of small bowel and colon  with contrast material noted throughout the colonic lumen on today's study. Electronically Signed   By: Kennith Center M.D.   On: 04/15/2020 08:45   DG Abd Portable 1V-Small Bowel Obstruction Protocol-initial, 8 hr delay  Result Date: 04/15/2020 CLINICAL DATA:  Small-bowel obstruction image.  8 hour delay. EXAM: PORTABLE ABDOMEN - 1 VIEW. The upper abdomen is collimated off view. COMPARISON:  CT abdomen pelvis 04/13/2020 FINDINGS: The upper abdomen is collimated off view with suggestion of the tip of an enteric tube overlying the gastric lumen. PO contrast opacifies the visualized portions of the gastric lumen. No PO contrast noted within the duodenum or distally. Dilated gaseous loops of small bowel within the left mid abdomen gas noted within the colon. Neural stimulator overlying the left iliac bone with leads overlying the pelvis. Surgical clips overlie the pelvis. Bowel anastomotic staples noted overlying the right lower abdomen. IMPRESSION: 1. PO contrast within the gastric lumen. Recommend  serial XR abdomen for further evaluation. 2. Small bowel gaseous dilatation and gas within the colon. Findings suggestive of an ileus. Electronically Signed   By: Tish Frederickson M.D.   On: 04/15/2020 02:20   DG Basil Dess Tube Plc W/Fl W/Rad  Result Date: 04/14/2020 CLINICAL DATA:  Gastric and small bowel dilatation and abdominal pain. NG tube placement requested for suction. EXAM: NASO G TUBE PLACEMENT WITH FL AND WITH RAD CONTRAST:  None. FLUOROSCOPY TIME:  Fluoroscopy Time:  0 minutes 12 seconds Radiation Exposure Index (if provided by the fluoroscopic device): 0.9 mGy Number of Acquired Spot Images: 0 COMPARISON:  04/13/2020 CT abdomen/pelvis. FINDINGS: Nasogastric tube advanced without difficulty into the stomach with tip in the gastric fundus. IMPRESSION: Successful nasogastric tube placement under fluoroscopic guidance with tip in the gastric fundus. Electronically Signed   By: Delbert Phenix M.D.   On: 04/14/2020 15:37    Microbiology: Recent Results (from the past 240 hour(s))  Urine culture     Status: Abnormal   Collection Time: 04/14/20 12:03 AM   Specimen: Urine, Clean Catch  Result Value Ref Range Status   Specimen Description   Final    URINE, CLEAN CATCH Performed at [facility], [address], [place], [place] [address]    Special Requests   Final    NONE Performed at [facility], [address] [place], [place] [address]    Culture MULTIPLE SPECIES PRESENT, SUGGEST RECOLLECTION (A)  Final   Report Status 04/15/2020 FINAL  Final  Respiratory Panel by RT PCR (Flu A&B, Covid) - Nasopharyngeal Swab     Status: None   Collection Time: 04/14/20 12:46 AM   Specimen: Nasopharyngeal Swab  Result Value Ref Range Status   SARS Coronavirus 2 by RT PCR NEGATIVE NEGATIVE Final    Comment: (NOTE) SARS-CoV-2 target nucleic acids are NOT DETECTED.  The SARS-CoV-2 RNA is generally detectable in upper respiratoy specimens during the acute phase of  infection. The lowest concentration of SARS-CoV-2 viral copies this assay can detect is 131 copies/mL. A negative result does not preclude SARS-Cov-2 infection and should not be used as the sole basis for treatment or other patient management decisions. A negative result may occur with  improper specimen collection/handling, submission of specimen other than nasopharyngeal swab, presence of viral mutation(s) within the areas targeted by this assay, and inadequate number of viral copies (<131 copies/mL). A negative result must be combined with clinical observations, patient history, and epidemiological information. The expected result is Negative.  Fact Sheet for Patients:  https://www.moore.com/  Fact Sheet for Healthcare Providers:  https://www.young.biz/  This test is no t yet approved or cleared by the [place] FDA and  has been authorized for detection and/or diagnosis of SARS-CoV-2 by FDA under an Emergency Use Authorization (EUA). This EUA will remain  in effect (meaning this test can be used) for the duration of the COVID-19 declaration under Section 564(b)(1) of the Act, 21 U.S.C. section 360bbb-3(b)(1), unless the authorization is terminated or revoked sooner.     Influenza A by PCR NEGATIVE NEGATIVE Final   Influenza B by PCR NEGATIVE NEGATIVE Final    Comment: (NOTE) The Xpert Xpress SARS-CoV-2/FLU/RSV assay is intended as an aid in  the diagnosis of influenza from Nasopharyngeal swab specimens and  should not be used as a sole basis for treatment. Nasal washings and  aspirates are unacceptable for Xpert Xpress SARS-CoV-2/FLU/RSV  testing.  Fact Sheet for Patients: https://www.moore.com/  Fact Sheet for Healthcare Providers: https://www.young.biz/  This test is not yet approved or cleared by the [place] FDA and  has been authorized for detection and/or diagnosis of SARS-CoV-2  by  FDA under an Emergency Use Authorization (EUA). This EUA will remain  in effect (meaning this test can be used) for the duration of the  Covid-19 declaration under Section 564(b)(1) of the Act, 21  U.S.C. section 360bbb-3(b)(1), unless the authorization is  terminated or revoked. Performed at [facility], [address] [place], [place] [address]      Labs: Basic Metabolic Panel: Recent Labs  Lab 04/14/20 0557 04/15/20 0448 04/16/20 0736 04/17/20 0632 04/18/20 0530  NA 137 137 138 133* 138  K 3.2* 3.7 4.6 3.8 3.9  CL 108 111 111 106 111  CO2 21* 19* 22 21* 21*  GLUCOSE 126* 103* 85 118* 95  BUN 9 <5* <5* <5* <5*  CREATININE 0.71 0.63 0.69 0.71 0.72  CALCIUM 8.8* 8.1* 8.9 8.9 8.3*  MG 1.7 2.2 2.2 1.8 2.3  PHOS  --  2.8 2.5 3.4 4.0   Liver Function Tests: Recent Labs  Lab 04/13/20 1831 04/13/20 1831 04/14/20 0557 04/15/20 0448 04/16/20 0736 04/17/20 0632 04/18/20 0530  AST 21  --  21  --   --   --   --   ALT 16  --  16  --   --   --   --   ALKPHOS 70  --  61  --   --   --   --   BILITOT 2.1*  --  1.9*  --   --   --   --   PROT 8.0  --  6.3*  --   --   --   --   ALBUMIN 4.2   < > 3.4* 3.2* 3.2* 3.4* 3.0*   < > = values in this interval not displayed.   Recent Labs  Lab 04/13/20 1831  LIPASE 38   No results for input(s): AMMONIA in the last 168 hours. CBC: Recent Labs  Lab 04/14/20 0557 04/15/20 0448 04/16/20 0736 04/17/20 0632 04/18/20 0530  WBC 4.8 4.5 3.8* 3.6* 3.3*  NEUTROABS 3.4 3.2  --  1.8  --   HGB 10.7* 10.7* 10.4* 11.1* 10.0*  HCT 33.0* 33.7* 33.5* 34.2* 31.9*  MCV 89.2 90.1 91.5 90.0 90.6  PLT 152 157 152 161 152   Cardiac Enzymes: No results for input(s): CKTOTAL, CKMB, CKMBINDEX, TROPONINI in the last 168 hours. BNP: BNP (last 3 results) No results for input(s): BNP in  the last 8760 hours.  ProBNP (last 3 results) No results for input(s): PROBNP in the last 8760 hours.  CBG: Recent Labs  Lab  04/16/20 1642 04/16/20 1646 04/16/20 2351 04/17/20 0621 04/17/20 1141  GLUCAP 96 116* 93 109* 106*       Signed:  Ramiro Harvest MD.  [facility] 04/18/2020, 3:08 PM

## 2020-04-18 NOTE — TOC Transition Note (Signed)
Transition of Care [facility]) - CM/SW Discharge Note   Patient Details  Name: [name] MRN: [id number] Date of Birth: 1990-10-22  Transition of Care [facility]) CM/SW Contact:  Lanier Clam, RN Phone Number: 04/18/2020, 3:54 PM   Clinical Narrative: Referral sent for otpt neuro rehab per patient agreement & recc from PT.No further CM needs.      Final next level of care: OP Rehab Barriers to Discharge: No Barriers Identified   Patient Goals and CMS Choice Patient states their goals for this hospitalization and ongoing recovery are:: go home CMS Medicare.gov Compare Post Acute Care list provided to:: Patient Choice offered to / list presented to : NA  Discharge Placement                       Discharge Plan and Services   Discharge Planning Services: CM Consult Post Acute Care Choice: NA                               Social Determinants of Health (SDOH) Interventions     Readmission Risk Interventions No flowsheet data found.

## 2020-04-20 ENCOUNTER — Other Ambulatory Visit: Payer: Self-pay

## 2020-04-20 ENCOUNTER — Ambulatory Visit: Payer: Medicaid Other | Attending: Internal Medicine

## 2020-04-20 DIAGNOSIS — R2681 Unsteadiness on feet: Secondary | ICD-10-CM | POA: Insufficient documentation

## 2020-04-20 DIAGNOSIS — R2689 Other abnormalities of gait and mobility: Secondary | ICD-10-CM | POA: Diagnosis present

## 2020-04-20 DIAGNOSIS — R42 Dizziness and giddiness: Secondary | ICD-10-CM | POA: Diagnosis present

## 2020-04-20 NOTE — Therapy (Addendum)
[facility] [address] [place], [place], [address] Phone: [phone]   Fax:  [phone]  Physical Therapy Evaluation  Patient Details  Name: [name] MRN: [id number] Date of Birth: 1990/12/16 Referring Provider ([name]): Ramiro Harvestaniel Thompson, MD   Encounter Date: 04/20/2020   [name] End of Session - 04/20/20 1102    Visit Number 1    Number of Visits 10    Date for [name] Re-Evaluation --   after 10th visit   Authorization Type Medicaid    [name] Start Time 1032   patient arriving late   [name] Stop Time 1101    [name] Time Calculation (min) 29 min    Activity Tolerance Patient tolerated treatment well    Behavior [facility] for tasks assessed/performed           Past Medical History:  Diagnosis Date  . Anemia   . Anxiety   . Arthritis   . Bilateral hearing loss   . Bladder mass   . Chronic interstitial cystitis   . Chronic interstitial cystitis with hematuria    2016  . Chronic sinusitis   . Fibromyalgia   . GERD (gastroesophageal reflux disease)   . Gross hematuria   . History of Graves' disease    tx done in 2007  . History of migraine   . History of recurrent UTIs   . Hypothyroidism   . Iron deficiency anemia    due to SGS  . Lower urinary tract symptoms (LUTS)   . Lupus [facility](HCC)    rheumologist--  dr Janalyn Rouseshaili devashwer  . Malabsorption syndrome   . Migraine   . Migraine   . Passage of loose stools    chronic --  due to short gut syndrome  . Pleuritis   . S/P radioactive iodine thyroid ablation    2007  . SGS (short gut syndrome)   . Short gut syndrome   . Sjogren's disease (HCC)   . Vitiligo   . Wears glasses     Past Surgical History:  Procedure Laterality Date  . BOWEL RESECTION  newborn   necrotizing enterocolitis (liver patched)  . BRONCHOSCOPY  2014  . CESAREAN SECTION  01/02/2012   Procedure: CESAREAN SECTION;  Surgeon: Lenoard Adenichard J Taavon, MD;  Location: [facility];  Service: Gynecology;   Laterality: N/A;  . COLONOSCOPY  07-29-2013  . CYSTECTOMY W/ URETEROILEAL CONDUIT  10/2017  . CYSTOSCOPY WITH BIOPSY N/A 11/23/2014   Procedure: CYSTOSCOPY WITH BLADDER  BIOPSY AND FULGERATION;  Surgeon: Bjorn PippinJohn Wrenn, MD;  [facility];  Service: Urology;  Laterality: N/A;  . KNEE ARTHROSCOPY Right 2008  . LIVER SURGERY    . MULTIPLE EXTRACTIONS WITH ALVEOLOPLASTY N/A 03/24/2018   Procedure: EXTRACTIONS X 9;  Surgeon: Ocie DoyneJensen, Scott, DDS;  Location: [facility];  Service: Oral Surgery;  Laterality: N/A;  . SMALL INTESTINE SURGERY    . WISDOM TOOTH EXTRACTION      There were no vitals filed for this visit.    Subjective Assessment - 04/20/20 1036    Subjective Patient reports that she has been having a dizziness sensation for a few years, but has progressively gotten worse in the last two years. Reports that her hearing started to be affected at end of last year. Reports that she has a spinning sensation, reports that it happens randomly. Patient reports that she has had it when she has sat up very quickly. No falls due to the dizziness.  Presented to ED on 10/13 for abdominal pain, admitted due to bowel obstruction. Evaluted for BPPV but reports was negative.    Pertinent History Lupus, Grave's Disease, Sjogran Disease, Migraines, Iron Deficiency Anemia.    Limitations Standing;Walking    Currently in Pain? No/denies              [facility] [name] Assessment - 04/20/20 0001      Assessment   Medical Diagnosis Vertigo    Referring Provider ([name]) Ramiro Harvest, MD    Onset Date/Surgical Date --   approx 3-4 years ago   Hand Dominance Right    Prior Therapy Prior Therapy for surgery of Knee      Precautions   Precautions Other (comment)    Precaution Comments Neuromodulator in the S2-3 area of spine. Colostomy Bag      Home Environment   Living Environment Private residence    Living Arrangements Children;Parent    Available Help at Discharge Family      Type of Home House    Home Access Level entry    Home Layout Two level    Alternate Level Stairs-Number of Steps 15    Alternate Level Stairs-Rails Right    Home Equipment Walker - 2 wheels    Additional Comments only use RW if very dizzy or has lupus flair up.       Prior Function   Level of Independence Independent    Vocation Self employed    Vocation Requirements Makes candles      Cognition   Overall Cognitive Status Within Functional Limits for tasks assessed      Sensation   Light Touch Impaired by gross assessment    Additional Comments patient reports numbness/tingling BLE, heavy feeling and began to notice after Lupus Flare Up      Ambulation/Gait   Ambulation/Gait Yes    Ambulation/Gait Assistance 7: Independent    Assistive device None    Gait Pattern Within Functional Limits    Ambulation Surface Level;Indoor                  Vestibular Assessment - 04/20/20 0001      Symptom Behavior   Subjective history of current problem Patient reports chronic dizziness that has been occuring over last couple years, has recently worsened in the last 1-2 years. Reports dizziness in intermittent but sometimes can be severe, and takes time for dizziness to subside. Patient also reduced/impaired hearing but often switches/experiences it in bilateral ears. patient reports tinnitus in ears that she has had approx 13 years. Did have hearing aids, does not wear them anymore.    Type of Dizziness  Spinning;Imbalance;Lightheadedness    Frequency of Dizziness intermitent    Duration of Dizziness 30 minutes to a few hours    Symptom Nature Motion provoked;Positional    Aggravating Factors Driving;Turning body quickly;Turning head quickly   turning head to R   Relieving Factors Lying supine;Closing eyes;Rest    Progression of Symptoms Worse    History of similar episodes long history of dizziness; but more recently worsened. last exerbation was in hospital       Oculomotor  Exam   Oculomotor Alignment Normal    Spontaneous Absent    Gaze-induced  Absent    Smooth Pursuits Intact    Saccades Hypometric      Vestibulo-Ocular Reflex   VOR 1 Head Only (x 1 viewing) increased dizziness and headache    VOR Cancellation Normal    Comment reports spinning  sensation with VOR cancellation      Positional Testing   Dix-Hallpike Dix-Hallpike Right;Dix-Hallpike Left      Dix-Hallpike Right   Dix-Hallpike Right Duration No Symptoms (spinning sensation coming up from position (lasted approx 60 seconds)     Dix-Hallpike Right Symptoms No nystagmus      Dix-Hallpike Left   Dix-Hallpike Left Duration No Symptoms, reports spinning sensation coming up from position (lasted approx 60 seconds)     Dix-Hallpike Left Symptoms No nystagmus      Positional Sensitivities   Up from Right Hallpike Moderate dizziness    Up from Left Hallpike Moderate dizziness    Positional Sensitivities Comments Patient had moderate dizziness (6/10) upon coming up from Right and Left Gilberto Better.               Objective measurements completed on examination: See above findings.       [name] Education - 04/20/20 1216    Education Details Educated on POC and Evaluation Findings    Person(s) Educated Patient    Methods Explanation    Comprehension Verbalized understanding           [name] Short Term Goals - 04/20/20 1225      [name] SHORT TERM GOAL #1   Title Patient will initiate vestibular/balance HEP (All STGS due after 3 Visits)    Baseline no HEP established    Time 3    Period --   visits   Status New      [name] SHORT TERM GOAL #2   Title Patient will undergo further vestibular assessment (DVA, MSQ, DHI, FGA)    Baseline TBD    Time 3    Period --   visits   Status New           [name] Long Term Goals - 04/20/20 1226      [name] LONG TERM GOAL #1   Title Patient will be independent with final vestibular/balance HEP (All LTGS due at 10th visit)    Baseline no HEP established     Time 9    Period --   visits   Status New      [name] LONG TERM GOAL #2   Title Patient will improve DHI by >/= 10 points to demonstrate improvements in symptoms and QoL    Baseline TBD    Time 9    Period --   visits   Status New      [name] LONG TERM GOAL #3   Title LTG to be set for DVA/MSQ as appropriate    Baseline TBD    Time 9    Period --   visits   Status New               Plan - 04/20/20 1217    Clinical Impression Statement Patient is a 29 y.o. female that was referred to Neuro OPPT services for Dizziness, which patient reports has been chronic in nature but has progressively worsened. Patient's PMH is significant for the following: PMH: Lupus, Grave's Disease, Sjogren Disease, Migraines, Iron Deficiency Anemia. Upon evaluation patient was negative for R/L BPPV but did have moderate dizziness return to sitting from [place] position. With completion of VOR x 1, and VOR cancellation symptoms increased included dizziness and headache. Assessment limited today due to patient arriving late to session, will continue assessment at next visit to further assess symptoms. Patient will benefit from skilled [name] services to progress toward goals and maximize mobility.  Personal Factors and Comorbidities Comorbidity 3+    Comorbidities Lupus, Grave's Disease, Sjogran Disease, Migraines, Iron Deficiency Anemia    Examination-Activity Limitations Bend;Lift;Stand;Locomotion Level;Caring for Others    Examination-Participation Restrictions Occupation;Community Activity;Driving    Stability/Clinical Decision Making Evolving/Moderate complexity    Clinical Decision Making Moderate    Rehab Potential Fair    [name] Frequency 1x / week    [name] Duration 3 weeks   followed by 2x/week for 3 weeks   [name] Treatment/Interventions ADLs/Self Care Home Management;Canalith Repostioning;Gait training;Stair training;Functional mobility training;Therapeutic activities;Therapeutic exercise;Balance  training;Neuromuscular re-education;Patient/family education;Vestibular;Passive range of motion;Manual techniques    [name] Next Visit Plan Finish Vestibular Assessment (MSQ, DVA). Begin HEP VOR x 1. Complete DHI as patient did not complete due to arriving late.    Consulted and Agree with Plan of Care Patient           Patient will benefit from skilled therapeutic intervention in order to improve the following deficits and impairments:  Decreased balance, Difficulty walking, Dizziness, Pain, Decreased activity tolerance, Impaired sensation  Visit Diagnosis: Dizziness and giddiness - Plan: [name] plan of care cert/re-cert  Other abnormalities of gait and mobility - Plan: [name] plan of care cert/re-cert  Unsteadiness on feet - Plan: [name] plan of care cert/re-cert     Problem List Patient Active Problem List   Diagnosis Date Noted  . Abdominal pain 04/14/2020  . Hypokalemia 04/14/2020  . Hypomagnesemia 04/14/2020  . Acute lower UTI 04/14/2020  . SBO (small bowel obstruction) (HCC) 04/14/2020  . Small bowel obstruction (HCC)   . Paresthesia 04/07/2019  . Chronic migraine 04/07/2019  . Hypersensitivity reaction 01/13/2016  . Iron deficiency anemia due to chronic blood loss 01/05/2016  . Chronic interstitial cystitis with hematuria   . H/O Graves' disease 07/22/2013  . Sjogren's disease (HCC) 07/22/2013  . Malabsorption syndrome 07/22/2013  . Migraine with aura 07/22/2013  . Pelvic adhesive disease 07/22/2013  . Hypothyroidism complicating pregnancy / delivered 12/28/2011    [name], [name], [name] 04/20/2020, 2:53 PM  [facility] [address] [place], [place], [address] Phone: [phone]   Fax:  [phone]  Name: [name] MRN: [id number] Date of Birth: Jul 16, 1990

## 2020-04-27 ENCOUNTER — Emergency Department (HOSPITAL_COMMUNITY)
Admission: EM | Admit: 2020-04-27 | Discharge: 2020-04-27 | Disposition: A | Discharge status: Home / Self Care | Payer: Medicaid Other | Attending: Emergency Medicine | Admitting: Emergency Medicine

## 2020-04-27 ENCOUNTER — Encounter (HOSPITAL_COMMUNITY): Payer: Self-pay

## 2020-04-27 ENCOUNTER — Emergency Department (HOSPITAL_COMMUNITY): Payer: Medicaid Other

## 2020-04-27 ENCOUNTER — Other Ambulatory Visit: Payer: Self-pay

## 2020-04-27 ENCOUNTER — Telehealth: Payer: Self-pay | Admitting: Gastroenterology

## 2020-04-27 DIAGNOSIS — K219 Gastro-esophageal reflux disease without esophagitis: Secondary | ICD-10-CM | POA: Insufficient documentation

## 2020-04-27 DIAGNOSIS — R1013 Epigastric pain: Secondary | ICD-10-CM | POA: Diagnosis present

## 2020-04-27 DIAGNOSIS — Z79899 Other long term (current) drug therapy: Secondary | ICD-10-CM | POA: Insufficient documentation

## 2020-04-27 DIAGNOSIS — R1084 Generalized abdominal pain: Secondary | ICD-10-CM | POA: Insufficient documentation

## 2020-04-27 DIAGNOSIS — E039 Hypothyroidism, unspecified: Secondary | ICD-10-CM | POA: Insufficient documentation

## 2020-04-27 LAB — URINALYSIS, ROUTINE W REFLEX MICROSCOPIC
Bilirubin Urine: NEGATIVE
Glucose, UA: NEGATIVE mg/dL
Hgb urine dipstick: NEGATIVE
Ketones, ur: NEGATIVE mg/dL
Nitrite: NEGATIVE
Protein, ur: 30 mg/dL — AB
Specific Gravity, Urine: 1.012 (ref 1.005–1.030)
WBC, UA: >50 WBC/hpf — ABNORMAL HIGH (ref 0–5)
pH: 7 (ref 5.0–8.0)

## 2020-04-27 LAB — COMPREHENSIVE METABOLIC PANEL
ALT: 13 U/L (ref 0–44)
AST: 25 U/L (ref 15–41)
Albumin: 3.9 g/dL (ref 3.5–5.0)
Alkaline Phosphatase: 66 U/L (ref 38–126)
Anion gap: 11 (ref 5–15)
BUN: 14 mg/dL (ref 6–20)
CO2: 19 mmol/L — ABNORMAL LOW (ref 22–32)
Calcium: 9.3 mg/dL (ref 8.9–10.3)
Chloride: 108 mmol/L (ref 98–111)
Creatinine, Ser: 0.68 mg/dL (ref 0.44–1.00)
GFR, Estimated: >60 mL/min (ref >60)
Glucose, Bld: 83 mg/dL (ref 70–99)
Potassium: 4.3 mmol/L (ref 3.5–5.1)
Sodium: 138 mmol/L (ref 135–145)
Total Bilirubin: 2 mg/dL — ABNORMAL HIGH (ref 0.3–1.2)
Total Protein: 7.6 g/dL (ref 6.5–8.1)

## 2020-04-27 LAB — CBC WITH DIFFERENTIAL/PLATELET
Abs Immature Granulocytes: 0.02 10*3/uL (ref 0.00–0.07)
Basophils Absolute: 0 10*3/uL (ref 0.0–0.1)
Basophils Relative: 0 %
Eosinophils Absolute: 0.1 10*3/uL (ref 0.0–0.5)
Eosinophils Relative: 1 %
HCT: 38.1 % (ref 36.0–46.0)
Hemoglobin: 11.8 g/dL — ABNORMAL LOW (ref 12.0–15.0)
Immature Granulocytes: 0 %
Lymphocytes Relative: 29 %
Lymphs Abs: 1.6 10*3/uL (ref 0.7–4.0)
MCH: 28.3 pg (ref 26.0–34.0)
MCHC: 31 g/dL (ref 30.0–36.0)
MCV: 91.4 fL (ref 80.0–100.0)
Monocytes Absolute: 0.3 10*3/uL (ref 0.1–1.0)
Monocytes Relative: 5 %
Neutro Abs: 3.6 10*3/uL (ref 1.7–7.7)
Neutrophils Relative %: 65 %
Platelets: 237 10*3/uL (ref 150–400)
RBC: 4.17 MIL/uL (ref 3.87–5.11)
RDW: 14.6 % (ref 11.5–15.5)
WBC: 5.5 10*3/uL (ref 4.0–10.5)
nRBC: 0 % (ref 0.0–0.2)

## 2020-04-27 LAB — LIPASE, BLOOD: Lipase: 35 U/L (ref 11–51)

## 2020-04-27 LAB — I-STAT BETA HCG BLOOD, ED (MC, WL, AP ONLY): I-stat hCG, quantitative: <5 m[IU]/mL (ref <5)

## 2020-04-27 NOTE — ED Provider Notes (Signed)
[facility]-EMERGENCY DEPT Provider Note   CSN: [id number] Arrival date & time: 04/27/20  1051     History Chief Complaint  Patient presents with  . Abdominal Pain  . Constipation    [name] is a 29 y.o. female.  HPI Patient denies abdominal pain.  On around the 17th or 18th was discharged to the hospital after bowel obstruction.  Went home and had a bowel movement or 2 but since then stools have become more hard.  Due to previous bowel surgery baseline stools are normally loose/diarrhea.  States she was little surprised by the hard stools.  States however over the last 5 days has not had much of a bowel movement.  States that she has been passing gas.  States she struggled a bowel movement today and just passed a little bit of stool that looked like peanut butter.  States it was brown.  Has had good urostomy output.  Pain is more in the upper abdomen/epigastric area.  Is the same pain she was having with the bowel obstruction.    Past Medical History:  Diagnosis Date  . Anemia   . Anxiety   . Arthritis   . Bilateral hearing loss   . Bladder mass   . Chronic interstitial cystitis   . Chronic interstitial cystitis with hematuria    2016  . Chronic sinusitis   . Fibromyalgia   . GERD (gastroesophageal reflux disease)   . Gross hematuria   . History of Graves' disease    tx done in 2007  . History of migraine   . History of recurrent UTIs   . Hypothyroidism   . Iron deficiency anemia    due to SGS  . Lower urinary tract symptoms (LUTS)   . Lupus [facility])    rheumologist--  dr Janalyn Rouse devashwer  . Malabsorption syndrome   . Migraine   . Migraine   . Passage of loose stools    chronic --  due to short gut syndrome  . Pleuritis   . S/P radioactive iodine thyroid ablation    2007  . SGS (short gut syndrome)   . Short gut syndrome   . Sjogren's disease (HCC)   . Vitiligo   . Wears glasses     Patient Active Problem List   Diagnosis  Date Noted  . Abdominal pain 04/14/2020  . Hypokalemia 04/14/2020  . Hypomagnesemia 04/14/2020  . Acute lower UTI 04/14/2020  . SBO (small bowel obstruction) (HCC) 04/14/2020  . Small bowel obstruction (HCC)   . Paresthesia 04/07/2019  . Chronic migraine 04/07/2019  . Hypersensitivity reaction 01/13/2016  . Iron deficiency anemia due to chronic blood loss 01/05/2016  . Chronic interstitial cystitis with hematuria   . H/O Graves' disease 07/22/2013  . Sjogren's disease (HCC) 07/22/2013  . Malabsorption syndrome 07/22/2013  . Migraine with aura 07/22/2013  . Pelvic adhesive disease 07/22/2013  . Hypothyroidism complicating pregnancy / delivered 12/28/2011    Past Surgical History:  Procedure Laterality Date  . BOWEL RESECTION  newborn   necrotizing enterocolitis (liver patched)  . BRONCHOSCOPY  2014  . CESAREAN SECTION  01/02/2012   Procedure: CESAREAN SECTION;  Surgeon: Lenoard Aden, MD;  Location: [facility];  Service: Gynecology;  Laterality: N/A;  . COLONOSCOPY  07-29-2013  . CYSTECTOMY W/ URETEROILEAL CONDUIT  10/2017  . CYSTOSCOPY WITH BIOPSY N/A 11/23/2014   Procedure: CYSTOSCOPY WITH BLADDER  BIOPSY AND FULGERATION;  Surgeon: Bjorn Pippin, MD;  Location: [facility];  Service: Urology;  Laterality: N/A;  . KNEE ARTHROSCOPY Right 2008  . LIVER SURGERY    . MULTIPLE EXTRACTIONS WITH ALVEOLOPLASTY N/A 03/24/2018   Procedure: EXTRACTIONS X 9;  Surgeon: Ocie DoyneJensen, Scott, DDS;  Location: [facility];  Service: Oral Surgery;  Laterality: N/A;  . SMALL INTESTINE SURGERY    . WISDOM TOOTH EXTRACTION       OB History    Gravida  3   Para  1   Term  1   Preterm  0   AB  1   Living  1     SAB  0   TAB  0   Ectopic  1   Multiple  0   Live Births  1           Family History  Problem Relation Age of Onset  . Diabetes Father   . Endometriosis Mother   . Fibromyalgia Mother   . Heart disease Maternal Grandmother   . Hypertension  Maternal Grandmother   . Osteoporosis Maternal Grandmother   . Heart disease Maternal Grandfather   . Hypertension Maternal Grandfather   . Prostate cancer Maternal Grandfather   . Heart disease Paternal Grandmother   . Other Neg Hx   . Colon cancer Neg Hx   . Pancreatic cancer Neg Hx   . Stomach cancer Neg Hx     Social History   Tobacco Use  . Smoking status: Never Smoker  . Smokeless tobacco: Never Used  Substance Use Topics  . Alcohol use: No    Alcohol/week: 0.0 standard drinks  . Drug use: No    Home Medications Prior to Admission medications   Medication Sig Start Date End Date Taking? Authorizing Provider  AMBULATORY NON FORMULARY MEDICATION Iron Inf[facility]    [provider]  cetirizine (ZYRTEC) 5 MG tablet Take 5 mg by mouth daily.    [provider]  cevimeline (EVOXAC) 30 MG capsule Take 1 capsule by mouth 3 (three) times daily as needed.  04/28/19   [provider]  cyclobenzaprine (FLEXERIL) 10 MG tablet Take 1 tablet (10 mg total) by mouth 2 (two) times daily as needed for muscle spasms. 08/10/17   Robinson, [place] N, PA-C  diphenoxylate-atropine (LOMOTIL) 2.5-0.025 MG tablet One tablet twice a day Patient not taking: Reported on 04/20/2020 03/08/17   Meredith PelGuenther, Paula M, NP  eletriptan (RELPAX) 40 MG tablet Take 1 tablet (40 mg total) by mouth as needed for migraine or headache. May repeat in 2 hours if headache persists or recurs. Patient not taking: Reported on 04/20/2020 04/07/19   Levert FeinsteinYan, Yijun, MD  EPINEPHrine 0.3 mg/0.3 mL IJ SOAJ injection Inject 0.3 mg into the muscle as needed for anaphylaxis.  03/31/19   [provider]  Erenumab-aooe (AIMOVIG) 70 MG/ML SOAJ Inject 70 mg into the skin at bedtime. 11/09/19   Levert FeinsteinYan, Yijun, MD  hydroxychloroquine (PLAQUENIL) 200 MG tablet Take 400 mg by mouth every morning.     [provider]  hydroxypropyl methylcellulose /  hypromellose (ISOPTO TEARS / GONIOVISC) 2.5 % ophthalmic solution Place 2 drops into both eyes 3 (three) times daily as needed for dry eyes.  04/27/19   [provider]  levothyroxine (SYNTHROID) 100 MCG tablet Take 175 mcg by mouth daily.  02/27/17   [provider]  levothyroxine (SYNTHROID) 175 MCG tablet Take 175 mcg by mouth daily before breakfast.    [provider]  omalizumab Geoffry Paradise) 150 MG/ML prefilled syringe Inject 150 mg into the skin every 28 (twenty-eight) days.     [provider]  OMEPRAZOLE PO Take 20 mg by mouth at bedtime.     [provider]  ondansetron (ZOFRAN-ODT) 8 MG disintegrating tablet Take 1 tablet (8 mg total) by mouth every 8 (eight) hours as needed. 11/09/19   Levert Feinstein, MD  polyethylene glycol (MIRALAX / GLYCOLAX) 17 g packet Take 17 g by mouth daily. Hold if you develop diarrhea and then use as needed. Patient not taking: Reported on 04/20/2020 04/19/20   Rodolph Bong, MD  predniSONE (DELTASONE) 5 MG tablet Take 1 tablet by mouth daily. 04/27/19   [provider]  propranolol ER (INDERAL LA) 80 MG 24 hr capsule Take 1 capsule (80 mg total) by mouth at bedtime. 02/09/20   Glean Salvo, NP  QUEtiapine (SEROQUEL) 300 MG tablet Take 300 mg by mouth at bedtime.    [provider]  SUMAtriptan (IMITREX) 6 MG/0.5ML SOLN injection Inject 0.5 mLs (6 mg total) into the skin every 2 (two) hours as needed for migraine or headache. May repeat in 2 hours if headache persists or recurs. 02/09/20   Glean Salvo, NP  tiZANidine (ZANAFLEX) 4 MG tablet Take 1 tablet (4 mg total) by mouth every 8 (eight) hours as needed for muscle spasms. 11/09/19   Levert Feinstein, MD  White Petrolatum-Mineral Oil [facility] PETROL-MINERAL OIL-LANOLIN) 0.1-0.1 % OINT Place 1 application into both eyes at bedtime. 04/28/19   [provider]    Allergies    Penicillins, Buprenorphine hcl, Morphine and related, and  Hydrocodone-acetaminophen  Review of Systems   Review of Systems  Constitutional: Negative for appetite change.  HENT: Negative for congestion.   Respiratory: Negative for shortness of breath.   Gastrointestinal: Positive for abdominal pain and constipation. Negative for diarrhea and nausea.  Genitourinary: Negative for flank pain.  Musculoskeletal: Negative for back pain.  Skin: Negative for rash.  Neurological: Negative for weakness.  Psychiatric/Behavioral: Negative for confusion.    Physical Exam Updated Vital Signs BP 102/73   Pulse (!) 56   Temp 98.6 F (37 C) (Oral)   Resp 16   LMP 04/08/2020   SpO2 97%   Physical Exam Vitals and nursing note reviewed.  HENT:     Head: Normocephalic.  Cardiovascular:     Rate and Rhythm: Normal rate and regular rhythm.  Pulmonary:     Breath sounds: Normal breath sounds.  Abdominal:     General: Bowel sounds are normal.     Hernia: No hernia is present.     Comments: Urostomy right lower abdomen.  Mild diffuse abdominal tenderness without frank distention.  No hernia palpated.  Skin:    Capillary Refill: Capillary refill takes less than 2 seconds.     Comments: Vitiligo  Neurological:     Mental Status: She is alert and oriented to person, place, and time.  Psychiatric:        Mood and Affect: Mood normal.     ED Results / Procedures / Treatments   Labs (all labs ordered are listed, but only abnormal results are displayed) Labs Reviewed  COMPREHENSIVE METABOLIC PANEL - Abnormal; Notable for the following components:      Result Value   CO2 19 (*)    Total Bilirubin 2.0 (*)    All other components within normal limits  URINALYSIS, ROUTINE W REFLEX MICROSCOPIC - Abnormal; Notable for the following components:  APPearance HAZY (*)    Protein, ur 30 (*)    Leukocytes,Ua TRACE (*)    WBC, UA >50 (*)    Bacteria, UA RARE (*)    All other components within normal limits  CBC WITH DIFFERENTIAL/PLATELET - Abnormal;  Notable for the following components:   Hemoglobin 11.8 (*)    All other components within normal limits  LIPASE, BLOOD  CBC WITH DIFFERENTIAL/PLATELET  I-STAT BETA HCG BLOOD, ED ([facility], [facility], [facility] ONLY)    EKG None  Radiology DG Abdomen Acute W/Chest  Result Date: 04/27/2020 CLINICAL DATA:  No bowel movement since 04/22/2020, constant abdominal pain, has taken MiraLax and suppository with no success, has urostomy EXAM: DG ABDOMEN ACUTE WITH 1 VIEW CHEST COMPARISON:  04/18/2020 FINDINGS: Neural stimulator LEFT sacrum. Nonobstructive bowel gas pattern. Scattered stool throughout colon without abnormal retained stool burden. No bowel dilatation or bowel wall thickening. No free air. Visualized lungs clear. Osseous structures unremarkable. IMPRESSION: No acute abnormalities. Electronically Signed   By: Ulyses Southward M.D.   On: 04/27/2020 12:37    Procedures Procedures (including critical care time)  Medications Ordered in ED Medications - No data to display  ED Course  I have reviewed the triage vital signs and the nursing notes.  Pertinent labs & imaging results that were available during my care of the patient were reviewed by me and considered in my medical decision making (see chart for details).    MDM Rules/Calculators/A&P                          Patient with decreased bowel movements.  Still passing gas however.  Had recent admission for bowel obstruction.  Had recently advance her diet.  Lab work reassuring.  Mild epigastric tenderness with abdomen not distended.  Has an x-ray does not show obstruction.  Discussed with patient we feel as if an obstruction is less likely at this point.  She will take a step back with her hydration and go back to liquids and gradually work her way forward.  Return for worsening symptoms.  I have reviewed lab work and imaging.  Differential diagnosis included bowel obstruction infection hernia colitis. Final Clinical Impression(s) / ED Diagnoses Final  diagnoses:  Generalized abdominal pain    Rx / DC Orders ED Discharge Orders    None       Benjiman Core, MD 04/27/20 1449

## 2020-04-27 NOTE — Telephone Encounter (Signed)
The pt has a recent history of SBO and was seen in the ED on 10/13.  She calls today with significant upper abd pain and no BM since Friday.  She tells me that she is trying to pass gas but has to push really hard and has only passed a very small hard BM that was "peanut butter like"  She has been advised to go to the ED for eval of SBO.  FYI Dr Christella Hartigan

## 2020-04-27 NOTE — ED Notes (Signed)
Attempted to start IV on pt X2.

## 2020-04-27 NOTE — Discharge Instructions (Addendum)
Take a step back in the oral fluids.  Go to liquids for now and gradually advance the diet.  Return for worsening symptoms.

## 2020-04-27 NOTE — ED Triage Notes (Signed)
Pt states she has not had a bowel movement since last Friday 10/22. Pt states she has taken Miralax, and tried one suppository with no success. Pt c/o constant abdominal pain. Pt states she was admitted last time for same problem 10/13. Pt has urostomy.

## 2020-04-29 ENCOUNTER — Ambulatory Visit: Payer: Medicaid Other | Admitting: Physical Therapy

## 2020-04-29 ENCOUNTER — Other Ambulatory Visit: Payer: Self-pay

## 2020-04-29 ENCOUNTER — Encounter: Payer: Self-pay | Admitting: Physical Therapy

## 2020-04-29 DIAGNOSIS — R2689 Other abnormalities of gait and mobility: Secondary | ICD-10-CM

## 2020-04-29 DIAGNOSIS — R42 Dizziness and giddiness: Secondary | ICD-10-CM | POA: Diagnosis not present

## 2020-04-29 DIAGNOSIS — R2681 Unsteadiness on feet: Secondary | ICD-10-CM

## 2020-04-29 NOTE — Therapy (Addendum)
[facility] [address] [place], [place], [address] Phone: [phone]   Fax:  [phone]  Physical Therapy Treatment  Patient Details  Name: Jamie ChadKateria B Nicholls MRN: [id number] Date of Birth: 1990-12-15 Referring Provider (PT): Ramiro Harvestaniel Thompson, MD   Encounter Date: 04/29/2020   PT End of Session - 04/29/20 1118    Visit Number 2    Number of Visits 10    Date for PT Re-Evaluation 05/19/20    Authorization Type Medicaid - approved for first three visits 04/29/2020 - 05/19/2020    Authorization - Visit Number 1    Authorization - Number of Visits 3    PT Start Time 1018    PT Stop Time 1104    PT Time Calculation (min) 46 min    Activity Tolerance Patient tolerated treatment well    Behavior During Therapy [facility] for tasks assessed/performed           Past Medical History:  Diagnosis Date  . Anemia   . Anxiety   . Arthritis   . Bilateral hearing loss   . Bladder mass   . Chronic interstitial cystitis   . Chronic interstitial cystitis with hematuria    2016  . Chronic sinusitis   . Fibromyalgia   . GERD (gastroesophageal reflux disease)   . Gross hematuria   . History of Graves' disease    tx done in 2007  . History of migraine   . History of recurrent UTIs   . Hypothyroidism   . Iron deficiency anemia    due to SGS  . Lower urinary tract symptoms (LUTS)   . Lupus [facility](HCC)    rheumologist--  dr Janalyn Rouseshaili devashwer  . Malabsorption syndrome   . Migraine   . Migraine   . Passage of loose stools    chronic --  due to short gut syndrome  . Pleuritis   . S/P radioactive iodine thyroid ablation    2007  . SGS (short gut syndrome)   . Short gut syndrome   . Sjogren's disease (HCC)   . Vitiligo   . Wears glasses     Past Surgical History:  Procedure Laterality Date  . BOWEL RESECTION  newborn   necrotizing enterocolitis (liver patched)  . BRONCHOSCOPY  2014  . CESAREAN SECTION  01/02/2012    Procedure: CESAREAN SECTION;  Surgeon: Lenoard Adenichard J Taavon, MD;  Location: [facility];  Service: Gynecology;  Laterality: N/A;  . COLONOSCOPY  07-29-2013  . CYSTECTOMY W/ URETEROILEAL CONDUIT  10/2017  . CYSTOSCOPY WITH BIOPSY N/A 11/23/2014   Procedure: CYSTOSCOPY WITH BLADDER  BIOPSY AND FULGERATION;  Surgeon: Bjorn PippinJohn Wrenn, MD;  [facility] [place];  Service: Urology;  Laterality: N/A;  . KNEE ARTHROSCOPY Right 2008  . LIVER SURGERY    . MULTIPLE EXTRACTIONS WITH ALVEOLOPLASTY N/A 03/24/2018   Procedure: EXTRACTIONS X 9;  Surgeon: Ocie DoyneJensen, Scott, DDS;  Location: [facility];  Service: Oral Surgery;  Laterality: N/A;  . SMALL INTESTINE SURGERY    . WISDOM TOOTH EXTRACTION      There were no vitals filed for this visit.   Subjective Assessment - 04/29/20 1024    Subjective Went back to ED for further abdominal pain but they weren't able to do anything new.  Is maintaining a liquid diet.  Dizziness has persisted and is every day.  Is wanting to see ENT.    Pertinent History Lupus, Grave's Disease, Sjogran Disease, Migraines, Iron Deficiency Anemia.  Limitations Standing;Walking    Currently in Pain? 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      Visual Acuity   Static 10    Dynamic 4   6 line difference     Positional Sensitivities   Nose to Right Knee Mild dizziness    Right Knee to Sitting Mild dizziness    Nose to Left Knee Mild dizziness    Left Knee to Sitting Mild dizziness    Head Turning x 5 Mild dizziness    Head Nodding x 5 Mild dizziness    Pivot Right in Standing No dizziness    Pivot Left in Standing Mild dizziness    Positional Sensitivities Comments Pt experienced dizziness and increased HA; electric  shock on R side of head                     Vestibular Treatment/Exercise - 04/29/20 1108      Vestibular Treatment/Exercise   Vestibular Treatment Provided Gaze    Gaze Exercises X1 Viewing Vertical;X1 Viewing Horizontal      X1 Viewing Horizontal   Foot Position Seated    Reps 2    Comments 15-20 seconds at a time, visual and HA symptoms mild      X1 Viewing Vertical   Foot Position seated    Reps 1    Comments not able to perform currently due to significant increase in eye strain and HA              Balance Exercises - 04/29/20 1109      Balance Exercises: Standing   Standing Eyes Closed Narrow base of support (BOS);Head turns;Foam/compliant surface;Solid surface;5 reps;Other reps (comment);Limitations    Standing Eyes Closed Limitations solid surface performed 5 reps without difficulty; added compliant surface and increased to 10 reps - pt reported significant increase in HA, reduced repetitions back down to 5 reps             PT Education - 04/29/20 1117    Education Details further clinical findings, sensory integration for balance, bilat vestibular hypofunction, initiated HEP, how to obtain an order for ENT from PCP, name of local ENT    Person(s) Educated Patient    Methods Explanation;Demonstration;Handout    Comprehension Verbalized understanding;Returned demonstration            PT Short Term Goals - 04/29/20 1138      PT SHORT TERM GOAL #1   Title Patient will initiate vestibular/balance HEP (All STGS due after 3 Visits)    Baseline no HEP established    Time 3    Period --   visits   Status New    Target Date 05/19/20      PT SHORT TERM GOAL #2   Title Patient will undergo further vestibular assessment (DVA, MSQ, DHI, FGA)    Baseline TBD    Time 3    Period --   visits   Status New    Target Date 05/19/20             PT Long Term Goals - 04/20/20 1226      PT  LONG TERM GOAL #1   Title Patient will be independent with  final vestibular/balance HEP (All LTGS due at 10th visit)    Baseline no HEP established    Time 9    Period --   visits   Status New      PT LONG TERM GOAL #2   Title Patient will improve DHI by >/= 10 points to demonstrate improvements in symptoms and QoL    Baseline TBD    Time 9    Period --   visits   Status New      PT LONG TERM GOAL #3   Title LTG to be set for DVA/MSQ as appropriate    Baseline TBD    Time 9    Period --   visits   Status New                 Plan - 04/29/20 1121    Clinical Impression Statement Continued assessment of vestibular impairments - pt demonstrates ocular alignment impairments, motion sensitivity and impaired VOR bilaterally (Bilateral hypofunction) as indicated by HIT and DVA 6 line difference.  Initiated HEP but pt will require very slow progression of VOR x1 viewing and sensory integration training due to headaches.  Will continue to address and progress towards LTG as pt is able to tolerate.    Personal Factors and Comorbidities Comorbidity 3+    Comorbidities Lupus, Grave's Disease, Sjogran Disease, Migraines, Iron Deficiency Anemia    Examination-Activity Limitations Bend;Lift;Stand;Locomotion Level;Caring for Others    Examination-Participation Restrictions Occupation;Community Activity;Driving    Stability/Clinical Decision Making Evolving/Moderate complexity    Rehab Potential Fair    PT Frequency 1x / week    PT Duration 3 weeks   followed by 2x/week for 3 weeks   PT Treatment/Interventions ADLs/Self Care Home Management;Canalith Repostioning;Gait training;Stair training;Functional mobility training;Therapeutic activities;Therapeutic exercise;Balance training;Neuromuscular re-education;Patient/family education;Vestibular;Passive range of motion;Manual techniques    PT Next Visit Plan Complete FGA and DHI as patient did not complete due to arriving late.  How are exercises going??  see if you can progress x1 horizontal and if you can  add in vertical.  Progress corner balance as able.  Will have to progress slowly due to HA.    Consulted and Agree with Plan of Care Patient           Patient will benefit from skilled therapeutic intervention in order to improve the following deficits and impairments:  Decreased balance, Difficulty walking, Dizziness, Pain, Decreased activity tolerance, Impaired sensation  Visit Diagnosis: Dizziness and giddiness  Other abnormalities of gait and mobility  Unsteadiness on feet     Problem List Patient Active Problem List   Diagnosis Date Noted  . Abdominal pain 04/14/2020  . Hypokalemia 04/14/2020  . Hypomagnesemia 04/14/2020  . Acute lower UTI 04/14/2020  . SBO (small bowel obstruction) (HCC) 04/14/2020  . Small bowel obstruction (HCC)   . Paresthesia 04/07/2019  . Chronic migraine 04/07/2019  . Hypersensitivity reaction 01/13/2016  . Iron deficiency anemia due to chronic blood loss 01/05/2016  . Chronic interstitial cystitis with hematuria   . H/O Graves' disease 07/22/2013  . Sjogren's disease (HCC) 07/22/2013  . Malabsorption syndrome 07/22/2013  . Migraine with aura 07/22/2013  . Pelvic adhesive disease 07/22/2013  . Hypothyroidism complicating pregnancy / delivered 12/28/2011    Dierdre Highman, PT, DPT 04/29/20    11:39 AM    [facility] [address] [place], [place], [address] Phone: [phone]  Fax:  [phone]  Name: DELLAMAE ROSAMILIA MRN: [id number] Date of Birth: 25-Oct-1990

## 2020-04-29 NOTE — Patient Instructions (Signed)
Gaze Stabilization: Sitting    Keeping eyes on target on wall 3 feet away, and move head side to side for _30_ seconds.  Perform 2-3 times a day. Copyright  VHI. All rights reserved.   Feet Together (Compliant Surface) Head Motion - Eyes Closed    Stand on compliant surface: Pillow/Cushion with feet together. Close eyes and move head slowly, up and down 5 times.  Take a break and then move your head slowly side to side, 5 times.. Repeat 2 times per session. Do 2 sessions per day.

## 2020-05-03 ENCOUNTER — Other Ambulatory Visit: Payer: Self-pay

## 2020-05-03 ENCOUNTER — Encounter: Payer: Medicaid Other | Attending: Physician Assistant | Admitting: Registered"

## 2020-05-03 ENCOUNTER — Encounter: Payer: Self-pay | Admitting: Registered"

## 2020-05-03 DIAGNOSIS — Z713 Dietary counseling and surveillance: Secondary | ICD-10-CM | POA: Insufficient documentation

## 2020-05-03 NOTE — Progress Notes (Signed)
Medical Nutrition Therapy:  Appt start time: 9:03 end time: 9:53  Patient was seen on 05/03/2020 for nutrition counseling pertaining to disordered eating  Primary care provider: Rueben Bash, PA Therapist: Mathis Dad (sees weekly)  ROI: 10/14/2018 Any other medical team members: none    Assessment:    Pt arrives stating she had a good weekend. States she has to drink everything. No solid foods. Was in hospital 10/13-10/18. States she went back to hospital. States she had an NG tube.  States she had a bowel obstruction and still having some challenges with digestion and bowel movements. Reports she has been consuming Ensure, chicken broth, jello, yogurt, fruit cups etc.   States she recently had low BP and low cortisol. Reports she is not stressed.   States she will eat when around people although not hungry sometimes. Does not feel hunger signals. Reports eating 3 meals/day.    Pt expectations: something she can do to help gain more weight  Previous appts: States she has started taking MCT, take 3 every full meal. Reports increased intake, improved dizziness, and improvements with chest pain/heart racing. Pt states she lives with parents and 29 year old daughter. Pt states she was taking MVI and supplements prior to last GI procedure and still vitamin deficient. Was deficient prior to procedures due to absorption and diarrhea. Due to large part of intestines being removed. Pt reports being deficient in B vitamins, fat-soluble vitamins, zinc, and selenium.   Pt reports having feeding tube from infancy to [age] years old. Pt reports restricting food since 29 years old; no spicy, no dairy, no greasy foods because it would cause her to vomit.   Pt states she has had over half intestines removed plus more to help with bladder. Pt states bladder pain has improved but she still has some stomach issues.   Pt reports being able to tolerate well: soup, rice, baked chicken, seafood, not a big  fan of meat, fruit. Pt states she also does not tolerate dairy or watermelon well.   Recent weight: 102 (pt reported from home), +1.7 lbs from 100.3 5 months ago (10/06/2019); our office scale is broken today  Medical Information:  Changes in hair, skin, nails since ED started: drier skin, some hair loss (believes it is due to medication changes) Chewing/swallowing difficulties: no Relux or heartburn: sometimes: not more than usual, takes reflux medications  Trouble with teeth: no LMP without the use of hormones: 3/14  Weight at that point: N/A Constipation, diarrhea: nothing unusual Dizziness/lightheadedness: no, only once last week Headaches/body aches: on and off Heart racing/chest pain: no Mood: better Sleep: about 5 hrs/night Focus/concentration: has trouble remembering things Cold intolerance: yes Vision changes: no  Mental health diagnosis:   Dietary assessment: A typical day consists of 3 meals and 1-2 snacks  Safe foods include: herbal tea, sweet tea, fruit smoothies, fruit, yogurt, rice, shrimp Avoided foods include: red meat, fish, bread, and cashews  24 hour recall:  B (6:15 AM): yogurt + fruit Snk ( AM):  L (12:30 PM): electrolye water + Ensure Snk (4 PM):  D (5 PM): stewed potatoes + soft greens + chicken broth  S (3 AM: Ensure  Beverages: water (2*16 oz), fruit smoothie  Usual physical activity: walking 30 min   What Methods Do You Use To Control Your Weight (Compensatory behaviors)?           Restricting (calories, fat, carbs)  Food rules or rituals-eats to gain weight, weighs about  6 times/day  to see how weight fluctuates   Estimated energy intake: 800-900 kcals  Estimated energy needs: 2000-2200 calories 225-248g carbohydrates 150-165 g protein 56-61 g fat  Nutritional Diagnosis:  NB-1.5 Disordered eating pattern As related to skipping meals.  As evidenced by dietary recall.    Intervention:  Nutrition education and counseling. Discussed  importance of continuing to nourish body and eat food as tolerated. Discussed listening to body and making time for rest. Pt was in agreement with goals.  Goals: - Check out Lupus Support Group: [facility] on Tues, 11/9 at 6:30pm. Zoom link should be available on Facebook.  - Continue to have soft foods such as: mashed potatoes, yogurt, jello, applesauce, fruit cups.  - Use soy milk instead of almond milk.   Teaching Method Utilized:  Visual Auditory Hands on  Handouts given during visit include:  none  Barriers to learning/adherence to lifestyle change: none identified  Demonstrated degree of understanding via:  Teach Back   Monitoring/Evaluation:  Dietary intake, exercise, and body weight in 4 week(s).

## 2020-05-03 NOTE — Patient Instructions (Addendum)
-   Check out Lupus Support Group: [facility] on Tues, 11/9 at 6:30pm. Zoom link should be available on Facebook.   - Continue to have soft foods such as: mashed potatoes, yogurt, jello, applesauce, fruit cups.   - Use soy milk instead of almond milk.

## 2020-05-04 ENCOUNTER — Ambulatory Visit: Payer: Medicaid Other | Attending: Internal Medicine

## 2020-05-04 ENCOUNTER — Other Ambulatory Visit: Payer: Self-pay

## 2020-05-04 DIAGNOSIS — R2681 Unsteadiness on feet: Secondary | ICD-10-CM | POA: Diagnosis present

## 2020-05-04 DIAGNOSIS — R2689 Other abnormalities of gait and mobility: Secondary | ICD-10-CM | POA: Diagnosis present

## 2020-05-04 DIAGNOSIS — R42 Dizziness and giddiness: Secondary | ICD-10-CM

## 2020-05-04 NOTE — Therapy (Signed)
[facility] [address] [place], [place], [address] Phone: [phone]   Fax:  [phone]  Physical Therapy Treatment  Patient Details  Name: [name] MRN: [id number] Date of Birth: 25-Dec-1990 Referring Provider (PT): Ramiro Harvestaniel Thompson, MD   Encounter Date: 05/04/2020   PT End of Session - 05/04/20 1233    Visit Number 3    Number of Visits 10    Date for PT Re-Evaluation 05/19/20    Authorization Type Medicaid - approved for first three visits 04/29/2020 - 05/19/2020    Authorization - Visit Number 2    Authorization - Number of Visits 3    PT Start Time 1234    PT Stop Time 1315    PT Time Calculation (min) 41 min    Activity Tolerance Patient tolerated treatment well    Behavior During Therapy [facility] for tasks assessed/performed           Past Medical History:  Diagnosis Date  . Anemia   . Anxiety   . Arthritis   . Bilateral hearing loss   . Bladder mass   . Chronic interstitial cystitis   . Chronic interstitial cystitis with hematuria    2016  . Chronic sinusitis   . Fibromyalgia   . GERD (gastroesophageal reflux disease)   . Gross hematuria   . History of Graves' disease    tx done in 2007  . History of migraine   . History of recurrent UTIs   . Hypothyroidism   . Iron deficiency anemia    due to SGS  . Lower urinary tract symptoms (LUTS)   . Lupus [facility](HCC)    rheumologist--  dr Janalyn Rouseshaili devashwer  . Malabsorption syndrome   . Migraine   . Migraine   . Passage of loose stools    chronic --  due to short gut syndrome  . Pleuritis   . S/P radioactive iodine thyroid ablation    2007  . SGS (short gut syndrome)   . Short gut syndrome   . Sjogren's disease (HCC)   . Vitiligo   . Wears glasses     Past Surgical History:  Procedure Laterality Date  . BOWEL RESECTION  newborn   necrotizing enterocolitis (liver patched)  . BRONCHOSCOPY  2014  . CESAREAN SECTION  01/02/2012    Procedure: CESAREAN SECTION;  Surgeon: Lenoard Adenichard J Taavon, MD;  Location: [facility];  Service: Gynecology;  Laterality: N/A;  . COLONOSCOPY  07-29-2013  . CYSTECTOMY W/ URETEROILEAL CONDUIT  10/2017  . CYSTOSCOPY WITH BIOPSY N/A 11/23/2014   Procedure: CYSTOSCOPY WITH BLADDER  BIOPSY AND FULGERATION;  Surgeon: Bjorn PippinJohn Wrenn, MD;  [facility];  Service: Urology;  Laterality: N/A;  . KNEE ARTHROSCOPY Right 2008  . LIVER SURGERY    . MULTIPLE EXTRACTIONS WITH ALVEOLOPLASTY N/A 03/24/2018   Procedure: EXTRACTIONS X 9;  Surgeon: Ocie DoyneJensen, Scott, DDS;  Location: [facility];  Service: Oral Surgery;  Laterality: N/A;  . SMALL INTESTINE SURGERY    . WISDOM TOOTH EXTRACTION      There were no vitals filed for this visit.   Subjective Assessment - 05/04/20 1235    Subjective Patient saw her PCP yesterday, reports it went fine still continues to be on liquid diet. PCP also reported that her blood count was low. Reports that she asked for referral to ENT, but no appt schedueld with ENT. No other new changes complaints. No falls. Patient reports that over the weekend, felt  like she had something in the ear and still feels this currently.    Pertinent History Lupus, Grave's Disease, Sjogran Disease, Migraines, Iron Deficiency Anemia.    Limitations Standing;Walking    Currently in Pain? No/denies              [facility] PT Assessment - 05/04/20 1241      Observation/Other Assessments   Other Surveys  Dizziness Handicap Inventory [facility])    Dizziness Handicap Inventory [facility])  Completed DHI with patient. Patient scoring /100 today.       Functional Gait  Assessment   Gait assessed  Yes    Gait Level Surface Walks 20 ft in less than 7 sec but greater than 5.5 sec, uses assistive device, slower speed, mild gait deviations, or deviates 6-10 in outside of the 12 in walkway width.    Change in Gait Speed Able to smoothly change walking speed without loss of balance or gait  deviation. Deviate no more than 6 in outside of the 12 in walkway width.    Gait with Horizontal Head Turns Performs head turns with moderate changes in gait velocity, slows down, deviates 10-15 in outside 12 in walkway width but recovers, can continue to walk.    Gait with Vertical Head Turns Performs task with slight change in gait velocity (eg, minor disruption to smooth gait path), deviates 6 - 10 in outside 12 in walkway width or uses assistive device    Gait and Pivot Turn Pivot turns safely within 3 sec and stops quickly with no loss of balance.    Step Over Obstacle Is able to step over 2 stacked shoe boxes taped together (9 in total height) without changing gait speed. No evidence of imbalance.    Gait with Narrow Base of Support Ambulates 7-9 steps.    Gait with Eyes Closed Walks 20 ft, slow speed, abnormal gait pattern, evidence for imbalance, deviates 10-15 in outside 12 in walkway width. Requires more than 9 sec to ambulate 20 ft.    Ambulating Backwards Walks 20 ft, uses assistive device, slower speed, mild gait deviations, deviates 6-10 in outside 12 in walkway width.    Steps Alternating feet, must use rail.    Total Score 21    FGA comment: 21/30 = Medium Fall Risk                Vestibular Treatment/Exercise - 05/04/20 0001      Vestibular Treatment/Exercise   Vestibular Treatment Provided Gaze    Gaze Exercises X1 Viewing Horizontal;X1 Viewing Vertical      X1 Viewing Horizontal   Foot Position Seated    Reps 2    Comments 30 secs. mild increase in HA symptoms.       X1 Viewing Vertical   Foot Position Seated    Reps 2    Comments 10 secs; patient reports ear ache after completion. Reports mild increase in HA with completion.               Balance Exercises - 05/04/20 1301      Balance Exercises: Standing   Standing Eyes Closed Narrow base of support (BOS);Head turns;Foam/compliant surface;5 reps;Limitations;Wide (BOA)    Standing Eyes Closed  Limitations completed 3 x 5 reps of horizontal head turns with vision removed. Completed standing with feet shoulder width apart, 3 x 30 seconds. Attempted with narrow BOS, patient did not feel comfortable completing.  CGA required  PT Education - 05/04/20 1243    Education Details educated on FGA results; HEP Update    Person(s) Educated Patient    Methods Explanation;Demonstration;Handout    Comprehension Verbalized understanding;Returned demonstration            PT Short Term Goals - 04/29/20 1138      PT SHORT TERM GOAL #1   Title Patient will initiate vestibular/balance HEP (All STGS due after 3 Visits)    Baseline no HEP established    Time 3    Period --   visits   Status New    Target Date 05/19/20      PT SHORT TERM GOAL #2   Title Patient will undergo further vestibular assessment (DVA, MSQ, DHI, FGA)    Baseline TBD    Time 3    Period --   visits   Status New    Target Date 05/19/20             PT Long Term Goals - 04/20/20 1226      PT LONG TERM GOAL #1   Title Patient will be independent with final vestibular/balance HEP (All LTGS due at 10th visit)    Baseline no HEP established    Time 9    Period --   visits   Status New      PT LONG TERM GOAL #2   Title Patient will improve DHI by >/= 10 points to demonstrate improvements in symptoms and QoL    Baseline TBD    Time 9    Period --   visits   Status New      PT LONG TERM GOAL #3   Title LTG to be set for DVA/MSQ as appropriate    Baseline TBD    Time 9    Period --   visits   Status New                 Plan - 05/04/20 1316    Clinical Impression Statement Today's skilled PT session included assessment of DHI, patient scoring 24/100. Completed further balance assessment with completion of FGA, patient scored 21/30 demonstrating medium fall risk at this time, most challenge noted with vision removed and horizontal head turns. Continued progression of VOR x 1 as  tolerated by patient, patient able to tolerate horizontal for 30 seconds today but still demo decreased tolerance with vertical due to HA. Will continue to progress toward all goals.    Personal Factors and Comorbidities Comorbidity 3+    Comorbidities Lupus, Grave's Disease, Sjogran Disease, Migraines, Iron Deficiency Anemia    Examination-Activity Limitations Bend;Lift;Stand;Locomotion Level;Caring for Others    Examination-Participation Restrictions Occupation;Community Activity;Driving    Stability/Clinical Decision Making Evolving/Moderate complexity    Rehab Potential Fair    PT Frequency 1x / week    PT Duration 3 weeks   followed by 2x/week for 3 weeks   PT Treatment/Interventions ADLs/Self Care Home Management;Canalith Repostioning;Gait training;Stair training;Functional mobility training;Therapeutic activities;Therapeutic exercise;Balance training;Neuromuscular re-education;Patient/family education;Vestibular;Passive range of motion;Manual techniques    PT Next Visit Plan How was HEP addition? Continue to progress x 1. Check Goals + Resubmit for visits    Consulted and Agree with Plan of Care Patient           Patient will benefit from skilled therapeutic intervention in order to improve the following deficits and impairments:  Decreased balance, Difficulty walking, Dizziness, Pain, Decreased activity tolerance, Impaired sensation  Visit Diagnosis: Dizziness and giddiness  Other abnormalities of  gait and mobility  Unsteadiness on feet     Problem List Patient Active Problem List   Diagnosis Date Noted  . Abdominal pain 04/14/2020  . Hypokalemia 04/14/2020  . Hypomagnesemia 04/14/2020  . Acute lower UTI 04/14/2020  . SBO (small bowel obstruction) (HCC) 04/14/2020  . Small bowel obstruction (HCC)   . Paresthesia 04/07/2019  . Chronic migraine 04/07/2019  . Hypersensitivity reaction 01/13/2016  . Iron deficiency anemia due to chronic blood loss 01/05/2016  . Chronic  interstitial cystitis with hematuria   . H/O Graves' disease 07/22/2013  . Sjogren's disease (HCC) 07/22/2013  . Malabsorption syndrome 07/22/2013  . Migraine with aura 07/22/2013  . Pelvic adhesive disease 07/22/2013  . Hypothyroidism complicating pregnancy / delivered 12/28/2011    Tempie Donning, PT, DPT 05/04/2020, 2:50 PM  [place] [facility] [address] [place], [place], [address] Phone: [phone]   Fax:  [phone]  Name: [name] MRN: [id number] Date of Birth: 03/24/1991

## 2020-05-04 NOTE — Patient Instructions (Signed)
Balance: Eyes Closed - Bilateral (Varied Surfaces)    Stand, feet shoulder width, close eyes on complaint surface (foam/pillow). Maintain balance 30 seconds. Repeat 3 times per set. Do 2 sets per session. Do 5 sessions per week.   Copyright  VHI. All rights reserved.

## 2020-05-11 ENCOUNTER — Ambulatory Visit: Payer: Medicaid Other

## 2020-05-11 ENCOUNTER — Inpatient Hospital Stay: Payer: Medicaid Other | Attending: Hematology and Oncology

## 2020-05-11 ENCOUNTER — Other Ambulatory Visit: Payer: Self-pay

## 2020-05-11 DIAGNOSIS — N3011 Interstitial cystitis (chronic) with hematuria: Secondary | ICD-10-CM | POA: Insufficient documentation

## 2020-05-11 DIAGNOSIS — R2681 Unsteadiness on feet: Secondary | ICD-10-CM

## 2020-05-11 DIAGNOSIS — R42 Dizziness and giddiness: Secondary | ICD-10-CM

## 2020-05-11 DIAGNOSIS — R5383 Other fatigue: Secondary | ICD-10-CM | POA: Insufficient documentation

## 2020-05-11 DIAGNOSIS — Z79899 Other long term (current) drug therapy: Secondary | ICD-10-CM | POA: Insufficient documentation

## 2020-05-11 DIAGNOSIS — D500 Iron deficiency anemia secondary to blood loss (chronic): Secondary | ICD-10-CM | POA: Insufficient documentation

## 2020-05-11 DIAGNOSIS — K56609 Unspecified intestinal obstruction, unspecified as to partial versus complete obstruction: Secondary | ICD-10-CM | POA: Diagnosis not present

## 2020-05-11 DIAGNOSIS — R2689 Other abnormalities of gait and mobility: Secondary | ICD-10-CM

## 2020-05-11 LAB — IRON AND TIBC
Iron: 24 ug/dL — ABNORMAL LOW (ref 41–142)
Saturation Ratios: 6 % — ABNORMAL LOW (ref 21–57)
TIBC: 366 ug/dL (ref 236–444)
UIBC: 343 ug/dL (ref 120–384)

## 2020-05-11 LAB — CBC WITH DIFFERENTIAL (CANCER CENTER ONLY)
Abs Immature Granulocytes: 0.01 10*3/uL (ref 0.00–0.07)
Basophils Absolute: 0 10*3/uL (ref 0.0–0.1)
Basophils Relative: 0 %
Eosinophils Absolute: 0.1 10*3/uL (ref 0.0–0.5)
Eosinophils Relative: 1 %
HCT: 30.6 % — ABNORMAL LOW (ref 36.0–46.0)
Hemoglobin: 9.9 g/dL — ABNORMAL LOW (ref 12.0–15.0)
Immature Granulocytes: 0 %
Lymphocytes Relative: 39 %
Lymphs Abs: 1.6 10*3/uL (ref 0.7–4.0)
MCH: 28.2 pg (ref 26.0–34.0)
MCHC: 32.4 g/dL (ref 30.0–36.0)
MCV: 87.2 fL (ref 80.0–100.0)
Monocytes Absolute: 0.3 10*3/uL (ref 0.1–1.0)
Monocytes Relative: 7 %
Neutro Abs: 2.1 10*3/uL (ref 1.7–7.7)
Neutrophils Relative %: 53 %
Platelet Count: 166 10*3/uL (ref 150–400)
RBC: 3.51 MIL/uL — ABNORMAL LOW (ref 3.87–5.11)
RDW: 14.5 % (ref 11.5–15.5)
WBC Count: 4 10*3/uL (ref 4.0–10.5)
nRBC: 0 % (ref 0.0–0.2)

## 2020-05-11 LAB — FERRITIN: Ferritin: 9 ng/mL — ABNORMAL LOW (ref 11–307)

## 2020-05-11 NOTE — Therapy (Signed)
[place] [address] [place], [place], [address] Phone: [phone]   Fax:  [phone]  Physical Therapy Treatment/Re-Certification  Patient Details  Name: [name] MRN: [id number] Date of Birth: 03/16/1991 Referring Provider (PT): Irine Seal, MD   Encounter Date: 05/11/2020   PT End of Session - 05/11/20 1234    Visit Number 4    Number of Visits 10    Date for PT Re-Evaluation 06/15/20   POC for 3 weeks   Authorization Type Medicaid - approved for first three visits 04/29/2020 - 05/19/2020, waiting MCD auth for 6 additional visits.    Authorization - Visit Number 3    Authorization - Number of Visits 3    PT Start Time 1230    PT Stop Time 1309    PT Time Calculation (min) 39 min    Activity Tolerance Patient tolerated treatment well    Behavior During Therapy WFL for tasks assessed/performed           Past Medical History:  Diagnosis Date  . Anemia   . Anxiety   . Arthritis   . Bilateral hearing loss   . Bladder mass   . Chronic interstitial cystitis   . Chronic interstitial cystitis with hematuria    2016  . Chronic sinusitis   . Fibromyalgia   . GERD (gastroesophageal reflux disease)   . Gross hematuria   . History of Graves' disease    tx done in 2007  . History of migraine   . History of recurrent UTIs   . Hypothyroidism   . Iron deficiency anemia    due to SGS  . Lower urinary tract symptoms (LUTS)   . Lupus [facility])    rheumologist--  dr Abel Presto devashwer  . Malabsorption syndrome   . Migraine   . Migraine   . Passage of loose stools    chronic --  due to short gut syndrome  . Pleuritis   . S/P radioactive iodine thyroid ablation    2007  . SGS (short gut syndrome)   . Short gut syndrome   . Sjogren's disease ([place])   . Vitiligo   . Wears glasses     Past Surgical History:  Procedure Laterality Date  . BOWEL RESECTION  newborn   necrotizing enterocolitis  (liver patched)  . BRONCHOSCOPY  2014  . CESAREAN SECTION  01/02/2012   Procedure: CESAREAN SECTION;  Surgeon: Lovenia Kim, MD;  Location: [place] [facility];  Service: Gynecology;  Laterality: N/A;  . COLONOSCOPY  07-29-2013  . CYSTECTOMY W/ URETEROILEAL CONDUIT  10/2017  . CYSTOSCOPY WITH BIOPSY N/A 11/23/2014   Procedure: CYSTOSCOPY WITH BLADDER  BIOPSY AND FULGERATION;  Surgeon: Irine Seal, MD;  [facility];  Service: Urology;  Laterality: N/A;  . KNEE ARTHROSCOPY Right 2008  . LIVER SURGERY    . MULTIPLE EXTRACTIONS WITH ALVEOLOPLASTY N/A 03/24/2018   Procedure: EXTRACTIONS X 9;  Surgeon: Diona Browner, DDS;  Location: [place];  Service: Oral Surgery;  Laterality: N/A;  . SMALL INTESTINE SURGERY    . WISDOM TOOTH EXTRACTION      There were no vitals filed for this visit.   Subjective Assessment - 05/11/20 1235    Subjective Patient reports the ear has been irritating her on/off. Still feels fullness in the ears. Patient also reports that the hearing is fluctuating in both ears. Patient recieved ENT with Dr. Benjamine Mola on December 20th. No other changes/complaints since last visit.  Pertinent History Lupus, Grave's Disease, Sjogran Disease, Migraines, Iron Deficiency Anemia.    Limitations Standing;Walking    Currently in Pain? No/denies              [facility] PT Assessment - 05/11/20 0001      Assessment   Medical Diagnosis Vertigo    Referring Provider (PT) Irine Seal, MD                   Vestibular Treatment/Exercise - 05/11/20 0001      Vestibular Treatment/Exercise   Vestibular Treatment Provided Gaze    Gaze Exercises X1 Viewing Horizontal;X1 Viewing Vertical      X1 Viewing Horizontal   Foot Position Seated    Reps 2    Comments 30 secs 1st rep, 20 secs 2nd rep. Sudden stop due to increased symptoms in R ear (reports ear ache/fullness)      X1 Viewing Vertical   Foot Position Seated    Reps 2    Comments 15 secs; at  end of completion reports eye strain and beginning of HA (reports HA 6/10)               Balance Exercises - 05/11/20 0001      Balance Exercises: Standing   Standing Eyes Opened Narrow base of support (BOS);Head turns;Foam/compliant surface;Limitations    Standing Eyes Opened Limitations completed 1 x 10 reps of horizontal/vertical head turns. increased HA with eyes open.     Standing Eyes Closed Narrow base of support (BOS);Head turns;Foam/compliant surface;5 reps;Limitations;Wide (BOA)    Standing Eyes Closed Limitations completed 1 x 10 reps of horizontal head turns with vision removed; 1 x 10 reps of vertical head turns with vision removed. increased in HA noted.     Tandem Stance Eyes open;Foam/compliant surface;3 reps;30 secs;Limitations    Tandem Stance Time 30 secs; completd with partial tandem stance. inceased challenge with RLE posterior.     Other Standing Exercises Comments intermittent standing rest breaks required               PT Education - 05/11/20 1310    Education Details progress toward STG; Update POC    Person(s) Educated Patient    Methods Explanation    Comprehension Verbalized understanding            PT Short Term Goals - 05/11/20 1238      PT SHORT TERM GOAL #1   Title Patient will initiate vestibular/balance HEP (All STGS due after 3 Visits)    Baseline HEP established, completing 3-4x/week    Time 3    Period --   visits   Status Partially Met    Target Date 05/19/20      PT SHORT TERM GOAL #2   Title Patient will undergo further vestibular assessment (DVA, MSQ, DHI, FGA)    Baseline Further assessment completed    Time 3    Period --   visits   Status Achieved    Target Date 05/19/20             PT Long Term Goals - 05/11/20 1501      PT LONG TERM GOAL #1   Title Patient will be independent with final vestibular/balance HEP (All LTGS 06/15/20)    Baseline no HEP established    Time 9    Period --   visits   Status New      Target Date 06/15/20      PT LONG TERM GOAL #2  Title Patient will improve DHI by >/= 10 points to demonstrate improvements in symptoms and QoL    Baseline 24/100    Time 9    Period --   visits   Status New      PT LONG TERM GOAL #3   Title Patient will improve to </= 2 line difference with DVA to show improved function of VOR    Baseline 6 line difference    Time 9    Period --   visits   Status New                 Plan - 05/11/20 1312    Clinical Impression Statement Assessed patient's progress toward STGs, patient able to meet all STGs today during session. Patient continues to have increased symptoms with gaze stabilization inlcuding HA and pain in B ears, requiring intermittent rest breaks with activites throughout session. Pt has ENT appointment scheduled for Dec 20th. Patient reporting she would like to continue with PT services until then and then make a decision regarding going forward after ENT appointment. Will continue per POC    Personal Factors and Comorbidities Comorbidity 3+    Comorbidities Lupus, Grave's Disease, Sjogran Disease, Migraines, Iron Deficiency Anemia    Examination-Activity Limitations Bend;Lift;Stand;Locomotion Level;Caring for Others    Examination-Participation Restrictions Occupation;Community Activity;Driving    Stability/Clinical Decision Making Evolving/Moderate complexity    Rehab Potential Fair    PT Frequency 2x / week    PT Duration 3 weeks    PT Treatment/Interventions ADLs/Self Care Home Management;Canalith Repostioning;Gait training;Stair training;Functional mobility training;Therapeutic activities;Therapeutic exercise;Balance training;Neuromuscular re-education;Patient/family education;Vestibular;Passive range of motion;Manual techniques    PT Next Visit Plan Continue to progress x 1. Balance Exercises. Slow Progression due to increased symptoms    Consulted and Agree with Plan of Care Patient           Patient will benefit  from skilled therapeutic intervention in order to improve the following deficits and impairments:  Decreased balance, Difficulty walking, Dizziness, Pain, Decreased activity tolerance, Impaired sensation  Visit Diagnosis: Dizziness and giddiness  Other abnormalities of gait and mobility  Unsteadiness on feet     Problem List Patient Active Problem List   Diagnosis Date Noted  . Abdominal pain 04/14/2020  . Hypokalemia 04/14/2020  . Hypomagnesemia 04/14/2020  . Acute lower UTI 04/14/2020  . SBO (small bowel obstruction) ([place]) 04/14/2020  . Small bowel obstruction ([place])   . Paresthesia 04/07/2019  . Chronic migraine 04/07/2019  . Hypersensitivity reaction 01/13/2016  . Iron deficiency anemia due to chronic blood loss 01/05/2016  . Chronic interstitial cystitis with hematuria   . H/O Graves' disease 07/22/2013  . Sjogren's disease ([place]) 07/22/2013  . Malabsorption syndrome 07/22/2013  . Migraine with aura 07/22/2013  . Pelvic adhesive disease 07/22/2013  . Hypothyroidism complicating pregnancy / delivered 12/28/2011    Jones Bales, PT, DPT 05/11/2020, 3:03 PM  [place] [address] [place], [place], [address] Phone: [phone]   Fax:  [phone]  Name: [name] MRN: [id number] Date of Birth: Mar 25, 1991

## 2020-05-12 NOTE — Progress Notes (Signed)
HEMATOLOGY-ONCOLOGY WEBEX VISIT PROGRESS NOTE  CHIEF COMPLIANT: Follow-up of iron deficiency anemia due to chronic blood loss  INTERVAL HISTORY: [name] is a 29 y.o. female with above-mentioned history of iron deficiency anemia due to chronic blood loss. Labs from 05/11/20 showed: Hg 9.9, hematocrit 30.6, iron saturation 6%, and ferritin 9.     She was recently in the hospital with small bowel obstruction.  She is eating semisolid/liquid diet.  She has noticed some fatigue as well as cravings for cold soda.  She has not noticed any blood in the stool.  Observations/Objective:  Today's Vitals   05/13/20 0913  BP: 106/71  Pulse: 93  Resp: 18  Temp: 99 F (37.2 C)  TempSrc: Tympanic  SpO2: 98%  Weight: 98 lb 6.4 oz (44.6 kg)  Height: 5' (1.524 m)   Body mass index is 19.22 kg/m.  I have reviewed the data as listed CMP Latest Ref Rng & Units 04/27/2020 04/18/2020 04/17/2020  Glucose 70 - 99 mg/dL 83 95 563(O)  BUN 6 - 20 mg/dL 14 <7(F) <6(E)  Creatinine 0.44 - 1.00 mg/dL 3.32 9.51 8.84  Sodium 135 - 145 mmol/L 138 138 133(L)  Potassium 3.5 - 5.1 mmol/L 4.3 3.9 3.8  Chloride 98 - 111 mmol/L 108 111 106  CO2 22 - 32 mmol/L 19(L) 21(L) 21(L)  Calcium 8.9 - 10.3 mg/dL 9.3 1.6(S) 8.9  Total Protein 6.5 - 8.1 g/dL 7.6 - -  Total Bilirubin 0.3 - 1.2 mg/dL 2.0(H) - -  Alkaline Phos 38 - 126 U/L 66 - -  AST 15 - 41 U/L 25 - -  ALT 0 - 44 U/L 13 - -    Lab Results  Component Value Date   WBC 4.0 05/11/2020   HGB 9.9 (L) 05/11/2020   HCT 30.6 (L) 05/11/2020   MCV 87.2 05/11/2020   PLT 166 05/11/2020   NEUTROABS 2.1 05/11/2020      Assessment Plan:  Iron deficiency anemia due to chronic blood loss Patient has had heavy menstrual bleeding accompanied by urinary tract bleeding from chronic interstitial cystitis. She has been on oral iron therapy since 2012 although at times she was taken off iron supplementation. Since February she has been anemic with a hemoglobin of  10.6-10.9. In spite of being on oral iron treatment her hemoglobin has remained low. She continuedto have symptoms related to iron deficiency with fatigue, ice craving, headaches etc. all of these symptoms improved with IV iron treatment given July 2017  Treatment summary: IV iron given July 2017, May 2019, November 2019 Hospitalization 04/13/2020-04/18/2020: Small bowel obstruction  Lab review: 11/05/2018: Hemoglobin 12.3, MCV 90.5 Iron studies: Ferritin 189, TIBC 255, iron saturation 27% 05/12/19: Hb 12.4, MCV: 92.6, Ferritin: 95, Iron sat: 31% 05/11/2020: Hemoglobin 9.9, MCV 87.2, iron saturation 6%, TIBC 366, ferritin 9  Based on these results she will need any IV iron. I ordered Venofer for 3 doses for her.  Return to clinic in 1 yearwith labs and follow-up.     I discussed the assessment and treatment plan with the patient. The patient was provided an opportunity to ask questions and all were answered. The patient agreed with the plan and demonstrated an understanding of the instructions. The patient was advised to call back or seek an in-person evaluation if the symptoms worsen or if the condition fails to improve as anticipated.   I provided 20 minutes of face-to-face Web Ex time during this encounter.    Sabas Sous, MD 05/13/2020  I, Molly Dorshimer, am acting as scribe for Nicholas Lose, MD.  I have reviewed the above documentation for accuracy and completeness, and I agree with the above.

## 2020-05-13 ENCOUNTER — Inpatient Hospital Stay (HOSPITAL_BASED_OUTPATIENT_CLINIC_OR_DEPARTMENT_OTHER): Payer: Medicaid Other | Admitting: Hematology and Oncology

## 2020-05-13 ENCOUNTER — Other Ambulatory Visit: Payer: Self-pay

## 2020-05-13 DIAGNOSIS — D500 Iron deficiency anemia secondary to blood loss (chronic): Secondary | ICD-10-CM | POA: Diagnosis not present

## 2020-05-13 NOTE — Assessment & Plan Note (Signed)
Patient has had heavy menstrual bleeding accompanied by urinary tract bleeding from chronic interstitial cystitis. She has been on oral iron therapy since 2012 although at times she was taken off iron supplementation. Since February she has been anemic with a hemoglobin of 10.6-10.9. In spite of being on oral iron treatment her hemoglobin has remained low. She continuedto have symptoms related to iron deficiency with fatigue, ice craving, headaches etc. all of these symptoms improved with IV iron treatment given July 2017  Treatment summary: IV iron given July 2017, May 2019, November 2019 Hospitalization 04/13/2020-04/18/2020: Small bowel obstruction  Lab review: 11/05/2018: Hemoglobin 12.3, MCV 90.5 Iron studies: Ferritin 189, TIBC 255, iron saturation 27% 05/12/19: Hb 12.4, MCV: 92.6, Ferritin: 95, Iron sat: 31% 05/11/2020: Hemoglobin 9.9, MCV 87.2, iron saturation 6%, TIBC 366, ferritin 9  Based on these results she will need any IV iron. Return to clinic in 1 yearwith labs and follow-up.

## 2020-05-23 ENCOUNTER — Inpatient Hospital Stay: Payer: Medicaid Other

## 2020-05-23 ENCOUNTER — Other Ambulatory Visit: Payer: Self-pay

## 2020-05-23 VITALS — BP 101/72 | HR 92 | Temp 99.0°F | Resp 18

## 2020-05-23 DIAGNOSIS — D500 Iron deficiency anemia secondary to blood loss (chronic): Secondary | ICD-10-CM

## 2020-05-23 MED ORDER — FAMOTIDINE IN NACL 20-0.9 MG/50ML-% IV SOLN
INTRAVENOUS | Status: AC
  Filled 2020-05-23: qty 50

## 2020-05-23 MED ORDER — FAMOTIDINE IN NACL 20-0.9 MG/50ML-% IV SOLN
20.0000 mg | Freq: Once | INTRAVENOUS | Status: AC | PRN
  Administered 2020-05-23: 20 mg via INTRAVENOUS

## 2020-05-23 MED ORDER — SODIUM CHLORIDE 0.9 % IV SOLN
300.0000 mg | Freq: Once | INTRAVENOUS | Status: AC
  Administered 2020-05-23: 300 mg via INTRAVENOUS
  Filled 2020-05-23: qty 10

## 2020-05-23 MED ORDER — FAMOTIDINE IN NACL 20-0.9 MG/50ML-% IV SOLN
20.0000 mg | Freq: Once | INTRAVENOUS | Status: AC
  Administered 2020-05-23: 20 mg via INTRAVENOUS

## 2020-05-23 MED ORDER — SODIUM CHLORIDE 0.9 % IV SOLN
INTRAVENOUS | Status: DC
  Filled 2020-05-23: qty 250

## 2020-05-23 NOTE — Progress Notes (Signed)
At 1728, upon completion of iron infusion, patient states her feet "felt funny, like they are swollen". Upon assessment, socks did leave small imprint in skin. Feet then elevated. Patient stated that the skin on her feet felt like they were stinging. Marga Hoots, PA notified. Verbal order given to administer another 20 mg pepcid IV. Pepcid administered, see MAR. Report given to Doy Hutching, RN.

## 2020-05-23 NOTE — Patient Instructions (Signed)

## 2020-05-23 NOTE — Progress Notes (Signed)
At 1550. Pt began to experience itching during venofer infusion.  No other complaints.  Infusion was stopped and Marga Hoots was called. See Mar. Nurse instructed to re-start infusion once symptoms resolved. The patient states the itching has resolved.

## 2020-05-24 ENCOUNTER — Ambulatory Visit: Payer: Medicaid Other | Admitting: Physical Therapy

## 2020-05-25 ENCOUNTER — Encounter: Payer: Self-pay | Admitting: Hematology and Oncology

## 2020-05-27 ENCOUNTER — Telehealth: Payer: Self-pay | Admitting: *Deleted

## 2020-05-27 ENCOUNTER — Inpatient Hospital Stay: Payer: Medicaid Other

## 2020-05-27 NOTE — Telephone Encounter (Signed)
This RN spoke with pt today per her call to on call stating onset of fever and cold symptoms.  This RN advised that she follow up per above with primary MD if needed as well as concern for possible Covid.  She states " I haven't been around anyone with Covid - and the people I am around have all been vaccinated "  She states her daughter had Rhino virus with the same symptoms ( tested - for covid ) " and she was kissing on me - so I think I caught it from her "  Discussed appointment today and need to reschedule- stat request sent to scheduling.

## 2020-05-30 ENCOUNTER — Ambulatory Visit: Payer: Medicaid Other | Admitting: Physical Therapy

## 2020-05-30 ENCOUNTER — Encounter: Payer: Self-pay | Admitting: Physical Therapy

## 2020-05-30 ENCOUNTER — Other Ambulatory Visit: Payer: Self-pay

## 2020-05-30 DIAGNOSIS — R42 Dizziness and giddiness: Secondary | ICD-10-CM

## 2020-05-30 DIAGNOSIS — R2689 Other abnormalities of gait and mobility: Secondary | ICD-10-CM

## 2020-05-30 NOTE — Patient Instructions (Signed)
Balance: Eyes Closed - Bilateral (Varied Surfaces)    Stand, feet shoulder width, close eyes on complaint surface (foam/pillow). Maintain balance 30 seconds. Repeat 3 times per set. Do 2 sets per session. Do 5 sessions per week.   Compensatory Strategies: Corrective Saccades    1. Tape two targets  _12_ inches apart on a wall.  Stand 3 feet away from the wall Start by looking at the middle between the two targets.  Move eyes to target on the right, pause and then turn head to the right.  Eyes then head back to the middle. Move eyes to target on the left, pause and then turn head to the left.    Perform 3-5 repetitions to each side.  Go slow.

## 2020-05-30 NOTE — Therapy (Signed)
[facility] [address] [place], [place], [address] Phone: [phone]   Fax:  [phone]  Physical Therapy Treatment  Patient Details  Name: [name] MRN: [id number] Date of Birth: 1991-01-06 Referring Provider (PT): Irine Seal, MD   Encounter Date: 05/30/2020   PT End of Session - 05/30/20 1620    Visit Number 5    Number of Visits 10    Date for PT Re-Evaluation 06/15/20   POC for 3 weeks   Authorization Type Medicaid - approved for first three visits 04/29/2020 - 05/19/2020, approved for 6 more visits 11/23 - 12/13    Authorization - Visit Number 1    Authorization - Number of Visits 6    PT Start Time 8416    PT Stop Time 1314    PT Time Calculation (min) 43 min    Equipment Utilized During Treatment Gait belt    Activity Tolerance Patient tolerated treatment well    Behavior During Therapy [facility] for tasks assessed/performed           Past Medical History:  Diagnosis Date  . Anemia   . Anxiety   . Arthritis   . Bilateral hearing loss   . Bladder mass   . Chronic interstitial cystitis   . Chronic interstitial cystitis with hematuria    2016  . Chronic sinusitis   . Fibromyalgia   . GERD (gastroesophageal reflux disease)   . Gross hematuria   . History of Graves' disease    tx done in 2007  . History of migraine   . History of recurrent UTIs   . Hypothyroidism   . Iron deficiency anemia    due to SGS  . Lower urinary tract symptoms (LUTS)   . Lupus [facility])    rheumologist--  dr Abel Presto devashwer  . Malabsorption syndrome   . Migraine   . Migraine   . Passage of loose stools    chronic --  due to short gut syndrome  . Pleuritis   . S/P radioactive iodine thyroid ablation    2007  . SGS (short gut syndrome)   . Short gut syndrome   . Sjogren's disease ([place])   . Vitiligo   . Wears glasses     Past Surgical History:  Procedure Laterality Date  . BOWEL RESECTION  newborn     necrotizing enterocolitis (liver patched)  . BRONCHOSCOPY  2014  . CESAREAN SECTION  01/02/2012   Procedure: CESAREAN SECTION;  Surgeon: Lovenia Kim, MD;  Location: [facility];  Service: Gynecology;  Laterality: N/A;  . COLONOSCOPY  07-29-2013  . CYSTECTOMY W/ URETEROILEAL CONDUIT  10/2017  . CYSTOSCOPY WITH BIOPSY N/A 11/23/2014   Procedure: CYSTOSCOPY WITH BLADDER  BIOPSY AND FULGERATION;  Surgeon: Irine Seal, MD;  Location: [facility];  Service: Urology;  Laterality: N/A;  . KNEE ARTHROSCOPY Right 2008  . LIVER SURGERY    . MULTIPLE EXTRACTIONS WITH ALVEOLOPLASTY N/A 03/24/2018   Procedure: EXTRACTIONS X 9;  Surgeon: Diona Browner, DDS;  Location: [facility];  Service: Oral Surgery;  Laterality: N/A;  . SMALL INTESTINE SURGERY    . WISDOM TOOTH EXTRACTION      There were no vitals filed for this visit.   Subjective Assessment - 05/30/20 1235    Subjective Patient reports still having the headaches, the ear has been irritating her on/off. Still feels fullness in the ears. Patient reports that the hearing has been more consistent but this  is likely due to patient being in bed and resting for the past week. Patient to have appt ENT with Dr. Benjamine Mola on December 20th. No other changes/complaints since last visit.    Pertinent History Lupus, Grave's Disease, Sjogran Disease, Migraines, Iron Deficiency Anemia.    Limitations Standing;Walking    Currently in Pain? No/denies                              Vestibular Treatment/Exercise - 05/30/20 1307      Vestibular Treatment/Exercise   Vestibular Treatment Provided Gaze    Gaze Exercises X1 Viewing Horizontal;X1 Viewing Vertical;Eye/Head Exercise Horizontal;Eye/Head Exercise Vertical      X1 Viewing Horizontal   Foot Position Seated, Standing    Reps 4   2 sitting, 2 standing   Comments 30 secs 1st rep, 20 secs 2nd rep with cues to increase speed on 2nd trial. increased symptoms in bilateral  temples. Increased irritation/aching in R eye      X1 Viewing Vertical   Foot Position Seated, standing    Reps 4   2 sitting, 2 standing   Comments 30 secs 1st rep, 20 secs 2nd rep with cues to increase speed on 2nd trial.  increased aching/irritation in L eye      Eye/Head Exercise Horizontal   Foot Position seated, standing    Reps 4   2 sitting, 2 standing   Comments Pt instructed in compensatory saccades training.  Pt cued on looking to R first, then follow with head, look back to midline, follow with head, then look toward the L first, followed by head motion to L.       Eye/Head Exercise Vertical   Foot Position seated, standing    Reps 4   2 sitting, 2 standing   Comments Pt instructed in compensatory saccades training.  Pt cued on looking with eyes toward superior target, then with head and back to midline with eyes, then with head and then to inferior target with eyes, followed by head.                 PT Education - 05/30/20 1455    Education Details removed VOR x1 and changed to compensatory saccades    Person(s) Educated Patient    Methods Explanation;Demonstration;Handout    Comprehension Verbalized understanding;Returned demonstration            PT Short Term Goals - 05/11/20 1238      PT SHORT TERM GOAL #1   Title Patient will initiate vestibular/balance HEP (All STGS due after 3 Visits)    Baseline HEP established, completing 3-4x/week    Time 3    Period --   visits   Status Partially Met    Target Date 05/19/20      PT SHORT TERM GOAL #2   Title Patient will undergo further vestibular assessment (DVA, MSQ, DHI, FGA)    Baseline Further assessment completed    Time 3    Period --   visits   Status Achieved    Target Date 05/19/20             PT Long Term Goals - 05/11/20 1501      PT LONG TERM GOAL #1   Title Patient will be independent with final vestibular/balance HEP (All LTGS 06/15/20)    Baseline no HEP established    Time 9     Period --   visits  Status New    Target Date 06/15/20      PT LONG TERM GOAL #2   Title Patient will improve DHI by >/= 10 points to demonstrate improvements in symptoms and QoL    Baseline 24/100    Time 9    Period --   visits   Status New      PT LONG TERM GOAL #3   Title Patient will improve to </= 2 line difference with DVA to show improved function of VOR    Baseline 6 line difference    Time 9    Period --   visits   Status New                 Plan - 05/30/20 1621    Clinical Impression Statement Today's skilled PT session focused on VOR x1 viewing and compensatory saccades training. Pt unable to maintain gaze stability even with slow VOR, so PT transitioned to compensatory saccades training. Pt also limited by headache and ear/head pressure as well as eye irritation/aching. Will continue per POC.    Personal Factors and Comorbidities Comorbidity 3+    Comorbidities Lupus, Grave's Disease, Sjogran Disease, Migraines, Iron Deficiency Anemia    Examination-Activity Limitations Bend;Lift;Stand;Locomotion Level;Caring for Others    Examination-Participation Restrictions Occupation;Community Activity;Driving    Stability/Clinical Decision Making Evolving/Moderate complexity    Rehab Potential Fair    PT Frequency 2x / week    PT Duration 3 weeks    PT Treatment/Interventions ADLs/Self Care Home Management;Canalith Repostioning;Gait training;Stair training;Functional mobility training;Therapeutic activities;Therapeutic exercise;Balance training;Neuromuscular re-education;Patient/family education;Vestibular;Passive range of motion;Manual techniques    PT Next Visit Plan Try to incorporate compensatory saccades into functional movement.  Continue to progress x 1. Balance Exercises. Slow Progression due to increased symptoms    Consulted and Agree with Plan of Care Patient           Patient will benefit from skilled therapeutic intervention in order to improve the  following deficits and impairments:  Decreased balance, Difficulty walking, Dizziness, Pain, Decreased activity tolerance, Impaired sensation  Visit Diagnosis: Dizziness and giddiness  Other abnormalities of gait and mobility     Problem List Patient Active Problem List   Diagnosis Date Noted  . Abdominal pain 04/14/2020  . Hypokalemia 04/14/2020  . Hypomagnesemia 04/14/2020  . Acute lower UTI 04/14/2020  . SBO (small bowel obstruction) ([place]) 04/14/2020  . Small bowel obstruction ([place])   . Paresthesia 04/07/2019  . Chronic migraine 04/07/2019  . Hypersensitivity reaction 01/13/2016  . Iron deficiency anemia due to chronic blood loss 01/05/2016  . Chronic interstitial cystitis with hematuria   . H/O Graves' disease 07/22/2013  . Sjogren's disease ([place]) 07/22/2013  . Malabsorption syndrome 07/22/2013  . Migraine with aura 07/22/2013  . Pelvic adhesive disease 07/22/2013  . Hypothyroidism complicating pregnancy / delivered 12/28/2011    Rosalita Levan, SPT 05/30/2020, 4:27 PM  [facility] [address] [place], [place], [address] Phone: [phone]   Fax:  [phone]  Name: BREZLYN MANRIQUE MRN: [id number] Date of Birth: 02-13-1991

## 2020-06-01 ENCOUNTER — Other Ambulatory Visit: Payer: Self-pay

## 2020-06-01 ENCOUNTER — Ambulatory Visit (HOSPITAL_BASED_OUTPATIENT_CLINIC_OR_DEPARTMENT_OTHER): Payer: Medicaid Other | Admitting: Medical

## 2020-06-01 ENCOUNTER — Inpatient Hospital Stay: Payer: Medicaid Other | Attending: Hematology and Oncology

## 2020-06-01 VITALS — BP 101/72 | HR 94 | Temp 97.7°F | Resp 18

## 2020-06-01 DIAGNOSIS — D500 Iron deficiency anemia secondary to blood loss (chronic): Secondary | ICD-10-CM | POA: Diagnosis present

## 2020-06-01 DIAGNOSIS — Z79899 Other long term (current) drug therapy: Secondary | ICD-10-CM | POA: Insufficient documentation

## 2020-06-01 MED ORDER — OXYCODONE HCL 5 MG PO TABS
ORAL_TABLET | ORAL | Status: AC
  Filled 2020-06-01: qty 1

## 2020-06-01 MED ORDER — DIPHENHYDRAMINE HCL 50 MG/ML IJ SOLN
25.0000 mg | Freq: Once | INTRAMUSCULAR | Status: AC
  Administered 2020-06-01: 25 mg via INTRAVENOUS

## 2020-06-01 MED ORDER — DIPHENHYDRAMINE HCL 25 MG PO CAPS
ORAL_CAPSULE | ORAL | Status: AC
  Filled 2020-06-01: qty 1

## 2020-06-01 MED ORDER — METHYLPREDNISOLONE SODIUM SUCC 125 MG IJ SOLR
60.0000 mg | Freq: Once | INTRAMUSCULAR | Status: AC
  Administered 2020-06-01: 60 mg via INTRAVENOUS

## 2020-06-01 MED ORDER — ACETAMINOPHEN 325 MG PO TABS
ORAL_TABLET | ORAL | Status: AC
  Filled 2020-06-01: qty 1

## 2020-06-01 MED ORDER — SODIUM CHLORIDE 0.9 % IV SOLN
Freq: Once | INTRAVENOUS | Status: AC
  Filled 2020-06-01: qty 250

## 2020-06-01 MED ORDER — SODIUM CHLORIDE 0.9 % IV SOLN
300.0000 mg | Freq: Once | INTRAVENOUS | Status: AC
  Administered 2020-06-01: 300 mg via INTRAVENOUS
  Filled 2020-06-01: qty 10

## 2020-06-01 MED ORDER — ACETAMINOPHEN 325 MG PO TABS
325.0000 mg | ORAL_TABLET | Freq: Once | ORAL | Status: AC
  Administered 2020-06-01: 325 mg via ORAL

## 2020-06-01 MED ORDER — FAMOTIDINE IN NACL 20-0.9 MG/50ML-% IV SOLN
20.0000 mg | Freq: Once | INTRAVENOUS | Status: AC
  Administered 2020-06-01: 20 mg via INTRAVENOUS

## 2020-06-01 MED ORDER — OXYCODONE HCL 5 MG PO TABS
5.0000 mg | ORAL_TABLET | Freq: Once | ORAL | Status: AC
  Administered 2020-06-01: 5 mg via ORAL

## 2020-06-01 MED ORDER — FAMOTIDINE IN NACL 20-0.9 MG/50ML-% IV SOLN
20.0000 mg | Freq: Once | INTRAVENOUS | Status: AC | PRN
  Administered 2020-06-01: 20 mg via INTRAVENOUS

## 2020-06-01 MED ORDER — DIPHENHYDRAMINE HCL 25 MG PO CAPS
25.0000 mg | ORAL_CAPSULE | Freq: Once | ORAL | Status: AC
  Administered 2020-06-01: 25 mg via ORAL

## 2020-06-01 NOTE — Progress Notes (Signed)
At approximately 1310 after the 30 min post obs for venofer, pt began to complain of swelling in feet and generalized pain. Marga Hoots called, see MAR for meds ordered.  Patient wanted to leave although still in pain.  Marga Hoots OK'd discharge.  Patient discharged in stable condition.  Instructed to call if any further complaints.

## 2020-06-01 NOTE — Patient Instructions (Signed)

## 2020-06-02 ENCOUNTER — Encounter: Payer: Self-pay | Admitting: Registered"

## 2020-06-02 ENCOUNTER — Encounter: Payer: Medicaid Other | Attending: Physician Assistant | Admitting: Registered"

## 2020-06-02 ENCOUNTER — Ambulatory Visit: Payer: Medicaid Other | Attending: Internal Medicine

## 2020-06-02 ENCOUNTER — Other Ambulatory Visit: Payer: Self-pay

## 2020-06-02 DIAGNOSIS — R2689 Other abnormalities of gait and mobility: Secondary | ICD-10-CM | POA: Diagnosis present

## 2020-06-02 DIAGNOSIS — R42 Dizziness and giddiness: Secondary | ICD-10-CM | POA: Diagnosis present

## 2020-06-02 DIAGNOSIS — Z713 Dietary counseling and surveillance: Secondary | ICD-10-CM | POA: Insufficient documentation

## 2020-06-02 DIAGNOSIS — R2681 Unsteadiness on feet: Secondary | ICD-10-CM | POA: Diagnosis present

## 2020-06-02 NOTE — Therapy (Signed)
[facility] [address] [place], [place], [address] Phone: [phone]   Fax:  [phone]  Physical Therapy Treatment  Patient Details  Name: [name] MRN: [id number] Date of Birth: 06/25/1991 Referring Provider (PT): Irine Seal, MD   Encounter Date: 06/02/2020   PT End of Session - 06/02/20 1446    Visit Number 6    Number of Visits 10    Date for PT Re-Evaluation 06/15/20   POC for 3 weeks   Authorization Type Medicaid - approved for first three visits 04/29/2020 - 05/19/2020, approved for 6 more visits 11/23 - 12/13    Authorization - Visit Number 2    Authorization - Number of Visits 6    PT Start Time 1446    PT Stop Time 1529    PT Time Calculation (min) 43 min    Equipment Utilized During Treatment Gait belt    Activity Tolerance Patient tolerated treatment well    Behavior During Therapy [facility] for tasks assessed/performed           Past Medical History:  Diagnosis Date  . Anemia   . Anxiety   . Arthritis   . Bilateral hearing loss   . Bladder mass   . Chronic interstitial cystitis   . Chronic interstitial cystitis with hematuria    2016  . Chronic sinusitis   . Fibromyalgia   . GERD (gastroesophageal reflux disease)   . Gross hematuria   . History of Graves' disease    tx done in 2007  . History of migraine   . History of recurrent UTIs   . Hypothyroidism   . Iron deficiency anemia    due to SGS  . Lower urinary tract symptoms (LUTS)   . Lupus [facility])    rheumologist--  dr Abel Presto devashwer  . Malabsorption syndrome   . Migraine   . Migraine   . Passage of loose stools    chronic --  due to short gut syndrome  . Pleuritis   . S/P radioactive iodine thyroid ablation    2007  . SGS (short gut syndrome)   . Short gut syndrome   . Sjogren's disease ([place])   . Vitiligo   . Wears glasses     Past Surgical History:  Procedure Laterality Date  . BOWEL RESECTION  newborn    necrotizing enterocolitis (liver patched)  . BRONCHOSCOPY  2014  . CESAREAN SECTION  01/02/2012   Procedure: CESAREAN SECTION;  Surgeon: Lovenia Kim, MD;  Location: [facility];  Service: Gynecology;  Laterality: N/A;  . COLONOSCOPY  07-29-2013  . CYSTECTOMY W/ URETEROILEAL CONDUIT  10/2017  . CYSTOSCOPY WITH BIOPSY N/A 11/23/2014   Procedure: CYSTOSCOPY WITH BLADDER  BIOPSY AND FULGERATION;  Surgeon: Irine Seal, MD;  Location: [facility];  Service: Urology;  Laterality: N/A;  . KNEE ARTHROSCOPY Right 2008  . LIVER SURGERY    . MULTIPLE EXTRACTIONS WITH ALVEOLOPLASTY N/A 03/24/2018   Procedure: EXTRACTIONS X 9;  Surgeon: Diona Browner, DDS;  Location: [facility];  Service: Oral Surgery;  Laterality: N/A;  . SMALL INTESTINE SURGERY    . WISDOM TOOTH EXTRACTION      There were no vitals filed for this visit.   Subjective Assessment - 06/02/20 1449    Subjective Patient reports that had infusion yesterday, and was not feeling good afterwards. Did start feel better this morning. Patient reports sinus headache.    Pertinent History Lupus, Grave's Disease, Sjogran  Disease, Migraines, Iron Deficiency Anemia.    Limitations Standing;Walking    Currently in Pain? No/denies                 [facility] Adult PT Treatment/Exercise - 06/02/20 0001      Therapeutic Activites    Therapeutic Activities Other Therapeutic Activities    Other Therapeutic Activities Completed smooth pursuit visual tracking activity, completed x 6 lines to promote improved smooth pursuits. Minimal symptoms with completion, Mild HA reported at end of completion. Completed compensatory saccade with functional movement of sit <> stand, having patient complete eye movement followed by head and then body movement, completed x 5 reps. No increase in symptoms reported with completion.            Vestibular Treatment/Exercise - 06/02/20 0001      Vestibular Treatment/Exercise   Vestibular  Treatment Provided Gaze    Gaze Exercises X1 Viewing Horizontal;X1 Viewing Vertical;Eye/Head Exercise Horizontal;Eye/Head Exercise Vertical;Comment      X1 Viewing Horizontal   Foot Position seated    Reps 2    Comments 30 seconds, slow pace, mild symptoms.      X1 Viewing Vertical   Foot Position seated    Reps 2    Comments 20 seconds; on 2nd rep increased symptoms 6-7/10. increased symptoms with vertical > horizontal.       Eye/Head Exercise Horizontal   Foot Position seated    Reps 2    Comments completed four square saccades, completed purely saccadic eye movements in horizontal/vertical direction, completed x 5 rows. completed compensatory saccades training, with eye movement followed by head movement. completed x 60 seconds, patietn reports mild ache in B ears after completion of horizontal.       Eye/Head Exercise Vertical   Foot Position seated    Reps 2    Comments completed compensatory saccades training, with eye movement followed by head movement. Completed x 60 seconds. increase in dizziness 4/10 with completion.                   PT Short Term Goals - 05/11/20 1238      PT SHORT TERM GOAL #1   Title Patient will initiate vestibular/balance HEP (All STGS due after 3 Visits)    Baseline HEP established, completing 3-4x/week    Time 3    Period --   visits   Status Partially Met    Target Date 05/19/20      PT SHORT TERM GOAL #2   Title Patient will undergo further vestibular assessment (DVA, MSQ, DHI, FGA)    Baseline Further assessment completed    Time 3    Period --   visits   Status Achieved    Target Date 05/19/20             PT Long Term Goals - 05/11/20 1501      PT LONG TERM GOAL #1   Title Patient will be independent with final vestibular/balance HEP (All LTGS 06/15/20)    Baseline no HEP established    Time 9    Period --   visits   Status New    Target Date 06/15/20      PT LONG TERM GOAL #2   Title Patient will improve DHI  by >/= 10 points to demonstrate improvements in symptoms and QoL    Baseline 24/100    Time 9    Period --   visits   Status New  PT LONG TERM GOAL #3   Title Patient will improve to </= 2 line difference with DVA to show improved function of VOR    Baseline 6 line difference    Time 9    Period --   visits   Status New                 Plan - 06/02/20 1908    Clinical Impression Statement Today's session focused on smooth pursuits and compensatory saccades, progressing into functional movement as tolerated. Continued progression of VOR x 1 as tolerated. Mild symptoms and HA intermittently limiting partiicpation due to increased rest breaks to allow for resolution of symptoms. Will continue to progress per POC.    Personal Factors and Comorbidities Comorbidity 3+    Comorbidities Lupus, Grave's Disease, Sjogran Disease, Migraines, Iron Deficiency Anemia    Examination-Activity Limitations Bend;Lift;Stand;Locomotion Level;Caring for Others    Examination-Participation Restrictions Occupation;Community Activity;Driving    Stability/Clinical Decision Making Evolving/Moderate complexity    Rehab Potential Fair    PT Frequency 2x / week    PT Duration 3 weeks    PT Treatment/Interventions ADLs/Self Care Home Management;Canalith Repostioning;Gait training;Stair training;Functional mobility training;Therapeutic activities;Therapeutic exercise;Balance training;Neuromuscular re-education;Patient/family education;Vestibular;Passive range of motion;Manual techniques    PT Next Visit Plan Continue to try incorporate compensatory saccades into functional movement.  Continue to progress x 1. Balance Exercises. Slow Progression due to increased symptoms    Consulted and Agree with Plan of Care Patient           Patient will benefit from skilled therapeutic intervention in order to improve the following deficits and impairments:  Decreased balance, Difficulty walking, Dizziness, Pain,  Decreased activity tolerance, Impaired sensation  Visit Diagnosis: Dizziness and giddiness  Other abnormalities of gait and mobility  Unsteadiness on feet     Problem List Patient Active Problem List   Diagnosis Date Noted  . Abdominal pain 04/14/2020  . Hypokalemia 04/14/2020  . Hypomagnesemia 04/14/2020  . Acute lower UTI 04/14/2020  . SBO (small bowel obstruction) ([place]) 04/14/2020  . Small bowel obstruction ([place])   . Paresthesia 04/07/2019  . Chronic migraine 04/07/2019  . Hypersensitivity reaction 01/13/2016  . Iron deficiency anemia due to chronic blood loss 01/05/2016  . Chronic interstitial cystitis with hematuria   . H/O Graves' disease 07/22/2013  . Sjogren's disease ([place]) 07/22/2013  . Malabsorption syndrome 07/22/2013  . Migraine with aura 07/22/2013  . Pelvic adhesive disease 07/22/2013  . Hypothyroidism complicating pregnancy / delivered 12/28/2011    Jones Bales, PT, DPT 06/02/2020, 7:13 PM  [facility] [address] [place], [place], [address] Phone: [phone]   Fax:  [phone]  Name: [name] MRN: [id number] Date of Birth: Apr 24, 1991

## 2020-06-02 NOTE — Patient Instructions (Addendum)
-   Continue to have 3 meals a day.   - Include 2 Ensures a day as snack options.

## 2020-06-02 NOTE — Progress Notes (Signed)
Medical Nutrition Therapy:  Appt start time: 9:01 end time: 9:50  Patient was seen on 06/02/2020 for nutrition counseling pertaining to disordered eating  Primary care provider: Rueben Bash, PA Therapist: Mathis Dad (sees weekly)  ROI: 10/14/2018 Any other medical team members: none    Assessment:    States she was sick last week and lost her voice. States she has some other health conditions going on and trying to figure  everything out. States she sections off food into snack bags because when she eats more than that her stomach cramps up. States she hasn't been feeling hungry lately. States she has not had constipation. Has diarrhea about once a week. States she is not hurting or swelling up. States she has been wanting mushy food, soft textured food, and fruit. States textured food makes her feel sick. States she is overhydrated per doctor visit. States she was unable to log into Zoom for Lupus support group because she was sick.   Pt expectations: something she can do to help gain more weight  Previous appts: States she has started taking MCT, take 3 every full meal. Reports increased intake, improved dizziness, and improvements with chest pain/heart racing. Pt states she lives with parents and 24 year old daughter. Pt states she was taking MVI and supplements prior to last GI procedure and still vitamin deficient. Was deficient prior to procedures due to absorption and diarrhea. Due to large part of intestines being removed. Pt reports being deficient in B vitamins, fat-soluble vitamins, zinc, and selenium.   Pt reports having feeding tube from infancy to [age] years old. Pt reports restricting food since 29 years old; no spicy, no dairy, no greasy foods because it would cause her to vomit.   Pt states she has had over half intestines removed plus more to help with bladder. Pt states bladder pain has improved but she still has some stomach issues.   Pt reports being able to tolerate  well: soup, rice, baked chicken, seafood, not a big fan of meat, fruit. Pt states she also does not tolerate dairy or watermelon well.   Recent weight: 102 (pt reported from home), +1.7 lbs from 100.3 5 months ago (10/06/2019); our office scale is broken today  Medical Information:  Changes in hair, skin, nails since ED started: drier skin, some hair loss (believes it is due to medication changes) Chewing/swallowing difficulties: no Relux or heartburn: sometimes: not more than usual, takes reflux medications  Trouble with teeth: no LMP without the use of hormones: 3/14  Weight at that point: N/A Constipation, diarrhea: nothing unusual Dizziness/lightheadedness: no, only once last week Headaches/body aches: on and off Heart racing/chest pain: no Mood: better Sleep: about 5 hrs/night Focus/concentration: has trouble remembering things Cold intolerance: yes Vision changes: no  Mental health diagnosis:   Dietary assessment: A typical day consists of 3 meals and 1-2 snacks  Safe foods include: herbal tea, sweet tea, fruit smoothies, fruit, yogurt, rice, shrimp Avoided foods include: red meat, fish, bread, and cashews  24 hour recall:  B (6:15 AM): Activia yogurt  Snk ( AM):  L (12:30 PM): grapes (snack bag size) + 1/4 plate sauteed chicken breast + 1/8 plate green beans  Snk (4 PM):  D (5 PM): rice + gravy + pineapple juice S (3 AM: Ensure  Beverages: water (2*44 oz; 88 oz), juice, Ensure, herbal tea  Usual physical activity: walking 30 min   What Methods Do You Use To Control Your Weight (Compensatory behaviors)?  Restricting (calories, fat, carbs)  Food rules or rituals-eats to gain weight, weighs about  6 times/day to see how weight fluctuates   Estimated energy intake: 800-900 kcals  Estimated energy needs: 2000-2200 calories 225-248g carbohydrates 150-165 g protein 56-61 g fat  Nutritional Diagnosis:  NB-1.5 Disordered eating pattern As related to skipping  meals.  As evidenced by dietary recall.    Intervention:  Nutrition education and counseling. Discussed importance of continuing to nourish body and eat food as tolerated. Discussed lack of hunger and relationship to decreased nourishment. Pt was in agreement with goals.  Goals: - Continue to have 3 meals a day.  - Include 2 Ensures a day as snack options.    Teaching Method Utilized:  Visual Auditory Hands on  Handouts given during visit include:  none  Barriers to learning/adherence to lifestyle change: none identified  Demonstrated degree of understanding via:  Teach Back   Monitoring/Evaluation:  Dietary intake, exercise, and body weight in 4 week(s).

## 2020-06-03 NOTE — Progress Notes (Signed)
This patient was seen in the infusion room after she had received Venofer. She reported that she was aching all over. She had a very dramatic presentation and was lying over the arms of the chair.  She acted as though she could not sit up. She was given pain medications and other interventions as indicated in the [facility]. She was released home after she had received medications as indicated in the [facility].  Marga Hoots, MHS, PA-C Physician Assistant

## 2020-06-07 ENCOUNTER — Other Ambulatory Visit: Payer: Self-pay

## 2020-06-07 ENCOUNTER — Ambulatory Visit: Payer: Medicaid Other

## 2020-06-07 DIAGNOSIS — R2681 Unsteadiness on feet: Secondary | ICD-10-CM

## 2020-06-07 DIAGNOSIS — R42 Dizziness and giddiness: Secondary | ICD-10-CM | POA: Diagnosis not present

## 2020-06-07 DIAGNOSIS — R2689 Other abnormalities of gait and mobility: Secondary | ICD-10-CM

## 2020-06-07 NOTE — Patient Instructions (Addendum)
Feet Together (Compliant Surface) Head Motion - Eyes Open    With eyes open, standing on compliant surface: with feet together, move head slowly: up and down, and then right to left. Repeat 10 times per session. Do 2 sessions per day.   Tandem Stance with Eyes Closed    Right foot in front of left, heel touching toe both feet "straight ahead". Get balance with eyes open, and then close eyes Balance in this position 30 seconds. Do with left foot in front of right.  Copyright  VHI. All rights reserved.

## 2020-06-07 NOTE — Therapy (Signed)
[facility] [address] [place], [place], [address] Phone: [phone]   Fax:  [phone]  Physical Therapy Treatment  Patient Details  Name: [name] MRN: [id number] Date of Birth: 06/30/1991 Referring Provider (PT): Irine Seal, MD   Encounter Date: 06/07/2020   PT End of Session - 06/07/20 1448    Visit Number 7    Number of Visits 10    Date for PT Re-Evaluation 06/15/20   POC for 3 weeks   Authorization Type Medicaid - approved for first three visits 04/29/2020 - 05/19/2020, approved for 6 more visits 11/23 - 12/13    Authorization - Visit Number 3    Authorization - Number of Visits 6    PT Start Time 1194    PT Stop Time 1528    PT Time Calculation (min) 41 min    Equipment Utilized During Treatment Gait belt    Activity Tolerance Patient tolerated treatment well    Behavior During Therapy [facility] for tasks assessed/performed           Past Medical History:  Diagnosis Date  . Anemia   . Anxiety   . Arthritis   . Bilateral hearing loss   . Bladder mass   . Chronic interstitial cystitis   . Chronic interstitial cystitis with hematuria    2016  . Chronic sinusitis   . Fibromyalgia   . GERD (gastroesophageal reflux disease)   . Gross hematuria   . History of Graves' disease    tx done in 2007  . History of migraine   . History of recurrent UTIs   . Hypothyroidism   . Iron deficiency anemia    due to SGS  . Lower urinary tract symptoms (LUTS)   . Lupus [facility])    rheumologist--  dr Abel Presto devashwer  . Malabsorption syndrome   . Migraine   . Migraine   . Passage of loose stools    chronic --  due to short gut syndrome  . Pleuritis   . S/P radioactive iodine thyroid ablation    2007  . SGS (short gut syndrome)   . Short gut syndrome   . Sjogren's disease ([place])   . Vitiligo   . Wears glasses     Past Surgical History:  Procedure Laterality Date  . BOWEL RESECTION  newborn    necrotizing enterocolitis (liver patched)  . BRONCHOSCOPY  2014  . CESAREAN SECTION  01/02/2012   Procedure: CESAREAN SECTION;  Surgeon: Lovenia Kim, MD;  Location: [facility];  Service: Gynecology;  Laterality: N/A;  . COLONOSCOPY  07-29-2013  . CYSTECTOMY W/ URETEROILEAL CONDUIT  10/2017  . CYSTOSCOPY WITH BIOPSY N/A 11/23/2014   Procedure: CYSTOSCOPY WITH BLADDER  BIOPSY AND FULGERATION;  Surgeon: Irine Seal, MD;  Location: [facility];  Service: Urology;  Laterality: N/A;  . KNEE ARTHROSCOPY Right 2008  . LIVER SURGERY    . MULTIPLE EXTRACTIONS WITH ALVEOLOPLASTY N/A 03/24/2018   Procedure: EXTRACTIONS X 9;  Surgeon: Diona Browner, DDS;  Location: [facility];  Service: Oral Surgery;  Laterality: N/A;  . SMALL INTESTINE SURGERY    . WISDOM TOOTH EXTRACTION      There were no vitals filed for this visit.   Subjective Assessment - 06/07/20 1448    Subjective Patient reports that still feels sore all over. Patient did report some mild pain in the ears after last session, but no dizziness.    Pertinent History Lupus, Grave's Disease,  Sjogran Disease, Migraines, Iron Deficiency Anemia.    Limitations Standing;Walking    Currently in Pain? No/denies              [facility] Adult PT Treatment/Exercise - 06/07/20 0001      Therapeutic Activites    Therapeutic Activities Other Therapeutic Activities    Other Therapeutic Activities Completed four square saccades x 5 lines without stopping, no increase in symptoms and no HA reported. Completed near/far saccade x 5 lines, increased pain in R ear and increased HA reported.            Vestibular Treatment/Exercise - 06/07/20 0001      Vestibular Treatment/Exercise   Vestibular Treatment Provided Gaze    Gaze Exercises X1 Viewing Horizontal;X1 Viewing Vertical;Eye/Head Exercise Horizontal;Eye/Head Exercise Vertical      X1 Viewing Horizontal   Foot Position Seated    Reps 1    Comments 30 seconds; mild visual  and HA symptoms, increased ear ache on R      X1 Viewing Vertical   Foot Position Seated    Reps 1    Comments 30 seconds; mild HA and visual symptoms. continue to require verbal cues for proper completion      Eye/Head Exercise Horizontal   Foot Position Seated    Comments Completed purely horizontal saccadic eye movement, 1 x 60 seconds, 1 x 40 seconds. Increased in HA and dizziness, increased pain in B ears and fullness with completion. More difficulty with saccadic eye movement to R.       Eye/Head Exercise Vertical   Foot Position Seated     Comments Completed purely vertical saccades 2 x 60 seconds, mild HA increase with looking upward motion. Increased symptoms on second repetition.               Balance Exercises - 06/07/20 0001      Balance Exercises: Standing   Standing Eyes Opened Narrow base of support (BOS);Head turns;Foam/compliant surface;Limitations    Standing Eyes Opened Limitations completed on airex, horizontal/vertical head turns x 10 reps each direction. increased challenge with vertical > horizontal.     Standing Eyes Closed Narrow base of support (BOS);Foam/compliant surface;3 reps;30 secs    Standing Eyes Closed Limitations completed 3 x 30 seconds with EC, increased sway noted    Tandem Stance Eyes closed;2 reps;30 secs    Tandem Stance Time completed on firm surface, 2 x 30 seconds with vision removed alteranting foot position. Increased challenge with RLE posteiror.            Feet Together (Compliant Surface) Head Motion - Eyes Open    With eyes open, standing on compliant surface: with feet together, move head slowly: up and down, and then right to left. Repeat 10 times per session. Do 2 sessions per day.   Tandem Stance with Eyes Closed    Right foot in front of left, heel touching toe both feet "straight ahead". Get balance with eyes open, and then close eyes Balance in this position 30 seconds. Do with left foot in front of  right.  Copyright  VHI. All rights reserved.     PT Education - 06/07/20 1527    Education Details HEP Update    Person(s) Educated Patient    Methods Explanation;Demonstration;Handout    Comprehension Verbalized understanding;Returned demonstration            PT Short Term Goals - 05/11/20 1238      PT SHORT TERM GOAL #1   Title  Patient will initiate vestibular/balance HEP (All STGS due after 3 Visits)    Baseline HEP established, completing 3-4x/week    Time 3    Period --   visits   Status Partially Met    Target Date 05/19/20      PT SHORT TERM GOAL #2   Title Patient will undergo further vestibular assessment (DVA, MSQ, DHI, FGA)    Baseline Further assessment completed    Time 3    Period --   visits   Status Achieved    Target Date 05/19/20             PT Long Term Goals - 05/11/20 1501      PT LONG TERM GOAL #1   Title Patient will be independent with final vestibular/balance HEP (All LTGS 06/15/20)    Baseline no HEP established    Time 9    Period --   visits   Status New    Target Date 06/15/20      PT LONG TERM GOAL #2   Title Patient will improve DHI by >/= 10 points to demonstrate improvements in symptoms and QoL    Baseline 24/100    Time 9    Period --   visits   Status New      PT LONG TERM GOAL #3   Title Patient will improve to </= 2 line difference with DVA to show improved function of VOR    Baseline 6 line difference    Time 9    Period --   visits   Status New                 Plan - 06/07/20 1533    Clinical Impression Statement Today's skilled PT session included continued focus on saccades and progression of VOR x 1, conitnuing to progress as tolerated. Patient continue to require intermittent rest breaks due to increased symptoms. progressed balance exercises and HEP today during session with patient tolerating well. Will continue to progress toward all LTGs.    Personal Factors and Comorbidities Comorbidity 3+     Comorbidities Lupus, Grave's Disease, Sjogran Disease, Migraines, Iron Deficiency Anemia    Examination-Activity Limitations Bend;Lift;Stand;Locomotion Level;Caring for Others    Examination-Participation Restrictions Occupation;Community Activity;Driving    Stability/Clinical Decision Making Evolving/Moderate complexity    Rehab Potential Fair    PT Frequency 2x / week    PT Duration 3 weeks    PT Treatment/Interventions ADLs/Self Care Home Management;Canalith Repostioning;Gait training;Stair training;Functional mobility training;Therapeutic activities;Therapeutic exercise;Balance training;Neuromuscular re-education;Patient/family education;Vestibular;Passive range of motion;Manual techniques    PT Next Visit Plan How was HEP additions? Continue to try incorporate compensatory saccades into functional movement.  Continue to progress x 1. Balance Exercises. Slow Progression due to increased symptoms    Consulted and Agree with Plan of Care Patient           Patient will benefit from skilled therapeutic intervention in order to improve the following deficits and impairments:  Decreased balance, Difficulty walking, Dizziness, Pain, Decreased activity tolerance, Impaired sensation  Visit Diagnosis: Dizziness and giddiness  Other abnormalities of gait and mobility  Unsteadiness on feet     Problem List Patient Active Problem List   Diagnosis Date Noted  . Abdominal pain 04/14/2020  . Hypokalemia 04/14/2020  . Hypomagnesemia 04/14/2020  . Acute lower UTI 04/14/2020  . SBO (small bowel obstruction) ([place]) 04/14/2020  . Small bowel obstruction ([place])   . Paresthesia 04/07/2019  . Chronic migraine 04/07/2019  . Hypersensitivity  reaction 01/13/2016  . Iron deficiency anemia due to chronic blood loss 01/05/2016  . Chronic interstitial cystitis with hematuria   . H/O Graves' disease 07/22/2013  . Sjogren's disease ([place]) 07/22/2013  . Malabsorption syndrome 07/22/2013  . Migraine with  aura 07/22/2013  . Pelvic adhesive disease 07/22/2013  . Hypothyroidism complicating pregnancy / delivered 12/28/2011    Jones Bales, PT, DPT 06/07/2020, 3:40 PM  [facility] [address] [place], [place], [address] Phone: [phone]   Fax:  [phone]  Name: [name] MRN: [id number] Date of Birth: 07-31-1990

## 2020-06-09 ENCOUNTER — Ambulatory Visit: Payer: Medicaid Other | Admitting: Physical Therapy

## 2020-06-09 ENCOUNTER — Other Ambulatory Visit: Payer: Self-pay

## 2020-06-09 DIAGNOSIS — R42 Dizziness and giddiness: Secondary | ICD-10-CM

## 2020-06-09 DIAGNOSIS — R2681 Unsteadiness on feet: Secondary | ICD-10-CM

## 2020-06-10 ENCOUNTER — Inpatient Hospital Stay: Payer: Medicaid Other

## 2020-06-10 ENCOUNTER — Other Ambulatory Visit: Payer: Self-pay

## 2020-06-10 VITALS — BP 93/57 | HR 55 | Temp 97.9°F | Resp 16

## 2020-06-10 DIAGNOSIS — D500 Iron deficiency anemia secondary to blood loss (chronic): Secondary | ICD-10-CM

## 2020-06-10 MED ORDER — METHYLPREDNISOLONE SODIUM SUCC 125 MG IJ SOLR
INTRAMUSCULAR | Status: AC
  Filled 2020-06-10: qty 2

## 2020-06-10 MED ORDER — FAMOTIDINE IN NACL 20-0.9 MG/50ML-% IV SOLN
20.0000 mg | Freq: Once | INTRAVENOUS | Status: AC
Start: 2020-06-10 — End: 2020-06-10
  Administered 2020-06-10: 20 mg via INTRAVENOUS

## 2020-06-10 MED ORDER — DIPHENHYDRAMINE HCL 50 MG/ML IJ SOLN
25.0000 mg | Freq: Once | INTRAMUSCULAR | Status: AC
  Administered 2020-06-10: 25 mg via INTRAVENOUS

## 2020-06-10 MED ORDER — SODIUM CHLORIDE 0.9 % IV SOLN
300.0000 mg | Freq: Once | INTRAVENOUS | Status: AC
  Administered 2020-06-10: 300 mg via INTRAVENOUS
  Filled 2020-06-10: qty 10

## 2020-06-10 MED ORDER — METHYLPREDNISOLONE SODIUM SUCC 125 MG IJ SOLR
60.0000 mg | Freq: Once | INTRAMUSCULAR | Status: AC
  Administered 2020-06-10: 60 mg via INTRAVENOUS

## 2020-06-10 MED ORDER — FAMOTIDINE IN NACL 20-0.9 MG/50ML-% IV SOLN
INTRAVENOUS | Status: AC
  Filled 2020-06-10: qty 50

## 2020-06-10 MED ORDER — DIPHENHYDRAMINE HCL 50 MG/ML IJ SOLN
INTRAMUSCULAR | Status: AC
  Filled 2020-06-10: qty 1

## 2020-06-10 MED ORDER — SODIUM CHLORIDE 0.9 % IV SOLN
Freq: Once | INTRAVENOUS | Status: AC
  Filled 2020-06-10: qty 250

## 2020-06-10 NOTE — Patient Instructions (Signed)

## 2020-06-10 NOTE — Therapy (Signed)
[facility] [address] [place], [place], [address] Phone: [phone]   Fax:  [phone]  Physical Therapy Treatment  Patient Details  Name: [name] MRN: [id number] Date of Birth: 12-13-1990 Referring Provider ([name]): Irine Seal, MD   Encounter Date: 06/09/2020   [name] End of Session - 06/10/20 1109    Visit Number 8    Number of Visits 10    Date for [name] Re-Evaluation 06/13/20   POC for 3 weeks   Authorization Type Medicaid - approved for first three visits 04/29/2020 - 05/19/2020, approved for 6 more visits 11/23 - 12/13    Authorization - Visit Number 4    Authorization - Number of Visits 6    [name] Start Time 7026    [name] Stop Time 1531    [name] Time Calculation (min) 44 min    Activity Tolerance Patient tolerated treatment well    Behavior During Therapy [facility] for tasks assessed/performed           Past Medical History:  Diagnosis Date  . Anemia   . Anxiety   . Arthritis   . Bilateral hearing loss   . Bladder mass   . Chronic interstitial cystitis   . Chronic interstitial cystitis with hematuria    2016  . Chronic sinusitis   . Fibromyalgia   . GERD (gastroesophageal reflux disease)   . Gross hematuria   . History of Graves' disease    tx done in 2007  . History of migraine   . History of recurrent UTIs   . Hypothyroidism   . Iron deficiency anemia    due to SGS  . Lower urinary tract symptoms (LUTS)   . Lupus [facility])    rheumologist--  dr Abel Presto devashwer  . Malabsorption syndrome   . Migraine   . Migraine   . Passage of loose stools    chronic --  due to short gut syndrome  . Pleuritis   . S/P radioactive iodine thyroid ablation    2007  . SGS (short gut syndrome)   . Short gut syndrome   . Sjogren's disease ([place])   . Vitiligo   . Wears glasses     Past Surgical History:  Procedure Laterality Date  . BOWEL RESECTION  newborn   necrotizing enterocolitis (liver patched)  .  BRONCHOSCOPY  2014  . CESAREAN SECTION  01/02/2012   Procedure: CESAREAN SECTION;  Surgeon: Lovenia Kim, MD;  Location: [facility];  Service: Gynecology;  Laterality: N/A;  . COLONOSCOPY  07-29-2013  . CYSTECTOMY W/ URETEROILEAL CONDUIT  10/2017  . CYSTOSCOPY WITH BIOPSY N/A 11/23/2014   Procedure: CYSTOSCOPY WITH BLADDER  BIOPSY AND FULGERATION;  Surgeon: Irine Seal, MD;  Location: [facility];  Service: Urology;  Laterality: N/A;  . KNEE ARTHROSCOPY Right 2008  . LIVER SURGERY    . MULTIPLE EXTRACTIONS WITH ALVEOLOPLASTY N/A 03/24/2018   Procedure: EXTRACTIONS X 9;  Surgeon: Diona Browner, DDS;  Location: [facility];  Service: Oral Surgery;  Laterality: N/A;  . SMALL INTESTINE SURGERY    . WISDOM TOOTH EXTRACTION      There were no vitals filed for this visit.   Subjective Assessment - 06/09/20 1455    Subjective [name] states she had episode around 12:15 today in which she stood up quickly and became light-headed    Pertinent History Lupus, Grave's Disease, Sjogran Disease, Migraines, Iron Deficiency Anemia.    Limitations Standing;Walking    Currently  in Pain? No/denies                              Vestibular Treatment/Exercise - 06/10/20 0001      Vestibular Treatment/Exercise   Gaze Exercises X1 Viewing Horizontal;X1 Viewing Vertical      X1 Viewing Horizontal   Foot Position standing    Reps 1    Comments 30 secs; c/o increased ear pain on Lt and increased HA      X1 Viewing Vertical   Foot Position standing    Reps 1    Comments 30 secs; c/o increased pressure and HA and increased ear pain bil.          [name] performed gaze stabilization exercise - hands clasped together; [name] made circles clockwise 5 reps and then counterclockwise 5 reps, tracking with eyes and head moving; [name] reported no increased dizziness with this activity- she stated that it was  Easier because it gave her a specific target to focus on without excessive  visual stimuli in environment     Balance Exercises - 06/10/20 0001      Balance Exercises: Standing   Standing Eyes Opened Narrow base of support (BOS);Wide (BOA);Head turns;Foam/compliant surface;5 reps   horizontal and vertical head turns - 5 reps each   Standing Eyes Closed Narrow base of support (BOS);Wide (BOA);Head turns;Foam/compliant surface;5 reps   horizontal and vertical head turns   Partial Tandem Stance Eyes open;2 reps;30 secs   each foot position - slow horizontal head turns 5 reps with CGA   Turning Right;Left;3 reps   sit to stand - step & pivot turn quickly to each side with CGA for balance recovery   Marching Foam/compliant surface;Head turns;Static;5 reps   horizontal and vertical              [name] Short Term Goals - 06/10/20 1116      [name] SHORT TERM GOAL #1   Title Patient will initiate vestibular/balance HEP (All STGS due after 3 Visits)    Baseline HEP established, completing 3-4x/week    Time 3    Period --   visits   Status Partially Met    Target Date 05/19/20      [name] SHORT TERM GOAL #2   Title Patient will undergo further vestibular assessment (DVA, MSQ, DHI, FGA)    Baseline Further assessment completed    Time 3    Period --   visits   Status Achieved    Target Date 05/19/20             [name] Long Term Goals - 06/10/20 1116      [name] LONG TERM GOAL #1   Title Patient will be independent with final vestibular/balance HEP (All LTGS 06/15/20)    Baseline no HEP established    Time 9    Period --   visits   Status New      [name] LONG TERM GOAL #2   Title Patient will improve DHI by >/= 10 points to demonstrate improvements in symptoms and QoL    Baseline 24/100    Time 9    Period --   visits   Status New      [name] LONG TERM GOAL #3   Title Patient will improve to </= 2 line difference with DVA to show improved function of VOR    Baseline 6 line difference    Time 9    Period --  visits   Status New                 Plan -  06/09/20 1457    Clinical Impression Statement [name] noted to have nystagmus with horizontal smooth pursuits in seated position and less nystagmus with vertical smooth pursuit testing in seated position.  [name] reports increased HA & ear pain in Rt ear with horizontal smooth pursuits.  [name] reports episode of light-headedness experienced prior to [name] session when she stood up very quickly, possibly due to orthostatic hypotension.  [name] had mild unsteadiness with standing on pillows with EC with head turns, with [name] continuing to c/o of HA and ear pain.  Cont with POC.    Personal Factors and Comorbidities Comorbidity 3+    Comorbidities Lupus, Grave's Disease, Sjogran Disease, Migraines, Iron Deficiency Anemia    Examination-Activity Limitations Bend;Lift;Stand;Locomotion Level;Caring for Others    Examination-Participation Restrictions Occupation;Community Activity;Driving    Stability/Clinical Decision Making Evolving/Moderate complexity    Rehab Potential Fair    [name] Frequency 2x / week    [name] Duration 3 weeks    [name] Treatment/Interventions ADLs/Self Care Home Management;Canalith Repostioning;Gait training;Stair training;Functional mobility training;Therapeutic activities;Therapeutic exercise;Balance training;Neuromuscular re-education;Patient/family education;Vestibular;Passive range of motion;Manual techniques    [name] Next Visit Plan Medicaid auth. date ends 06-13-20 -- renew vs. D/C?  Continue to try incorporate compensatory saccades into functional movement.  Continue to progress x 1. Balance Exercises. Slow Progression due to increased symptoms    Consulted and Agree with Plan of Care Patient           Patient will benefit from skilled therapeutic intervention in order to improve the following deficits and impairments:  Decreased balance,Difficulty walking,Dizziness,Pain,Decreased activity tolerance,Impaired sensation  Visit Diagnosis: Dizziness and giddiness  Unsteadiness on feet     Problem  List Patient Active Problem List   Diagnosis Date Noted  . Abdominal pain 04/14/2020  . Hypokalemia 04/14/2020  . Hypomagnesemia 04/14/2020  . Acute lower UTI 04/14/2020  . SBO (small bowel obstruction) ([place]) 04/14/2020  . Small bowel obstruction ([place])   . Paresthesia 04/07/2019  . Chronic migraine 04/07/2019  . Hypersensitivity reaction 01/13/2016  . Iron deficiency anemia due to chronic blood loss 01/05/2016  . Chronic interstitial cystitis with hematuria   . H/O Graves' disease 07/22/2013  . Sjogren's disease ([place]) 07/22/2013  . Malabsorption syndrome 07/22/2013  . Migraine with aura 07/22/2013  . Pelvic adhesive disease 07/22/2013  . Hypothyroidism complicating pregnancy / delivered 12/28/2011    [name], [name] 06/10/2020, 11:17 AM  [facility] [address] [place], [place], [address] Phone: [phone]   Fax:  [phone]  Name: [name] MRN: [id number] Date of Birth: 1991/03/13

## 2020-06-10 NOTE — Progress Notes (Signed)
Patient observed for 30 minutes post infusion with not complaints. VSS upon leaving unit.

## 2020-06-13 ENCOUNTER — Encounter: Payer: Self-pay | Admitting: Physical Therapy

## 2020-06-13 ENCOUNTER — Ambulatory Visit: Payer: Medicaid Other | Admitting: Physical Therapy

## 2020-06-13 ENCOUNTER — Other Ambulatory Visit: Payer: Self-pay

## 2020-06-13 VITALS — BP 111/83 | HR 124

## 2020-06-13 DIAGNOSIS — R2681 Unsteadiness on feet: Secondary | ICD-10-CM

## 2020-06-13 DIAGNOSIS — R2689 Other abnormalities of gait and mobility: Secondary | ICD-10-CM

## 2020-06-13 DIAGNOSIS — R42 Dizziness and giddiness: Secondary | ICD-10-CM | POA: Diagnosis not present

## 2020-06-13 NOTE — Therapy (Signed)
[facility] [address] [place], [place], [address] Phone: [phone]   Fax:  [phone]  Physical Therapy Treatment  Patient Details  Name: [name] MRN: [id number] Date of Birth: [birth year] Referring Provider (PT): Irine Seal, MD   Encounter Date: 06/13/2020   PT End of Session - 06/13/20 0938    Visit Number 9    Number of Visits 10    Date for PT Re-Evaluation 06/13/20   POC for 3 weeks   Authorization Type Medicaid - approved for first three visits 04/29/2020 - 05/19/2020, approved for 6 more visits 11/23 - 12/13    Authorization - Visit Number 5    Authorization - Number of Visits 6    PT Start Time 0850    PT Stop Time 0932    PT Time Calculation (min) 42 min    Activity Tolerance Patient tolerated treatment well    Behavior During Therapy [facility] for tasks assessed/performed           Past Medical History:  Diagnosis Date  . Anemia   . Anxiety   . Arthritis   . Bilateral hearing loss   . Bladder mass   . Chronic interstitial cystitis   . Chronic interstitial cystitis with hematuria    2016  . Chronic sinusitis   . Fibromyalgia   . GERD (gastroesophageal reflux disease)   . Gross hematuria   . History of Graves' disease    tx done in 2007  . History of migraine   . History of recurrent UTIs   . Hypothyroidism   . Iron deficiency anemia    due to SGS  . Lower urinary tract symptoms (LUTS)   . Lupus [facility])    rheumologist--  dr Abel Presto devashwer  . Malabsorption syndrome   . Migraine   . Migraine   . Passage of loose stools    chronic --  due to short gut syndrome  . Pleuritis   . S/P radioactive iodine thyroid ablation    2007  . SGS (short gut syndrome)   . Short gut syndrome   . Sjogren's disease ([place])   . Vitiligo   . Wears glasses     Past Surgical History:  Procedure Laterality Date  . BOWEL RESECTION  newborn   necrotizing enterocolitis (liver patched)  .  BRONCHOSCOPY  2014  . CESAREAN SECTION  01/02/2012   Procedure: CESAREAN SECTION;  Surgeon: Lovenia Kim, MD;  Location: [facility];  Service: Gynecology;  Laterality: N/A;  . COLONOSCOPY  07-29-2013  . CYSTECTOMY W/ URETEROILEAL CONDUIT  10/2017  . CYSTOSCOPY WITH BIOPSY N/A 11/23/2014   Procedure: CYSTOSCOPY WITH BLADDER  BIOPSY AND FULGERATION;  Surgeon: Irine Seal, MD;  Location: [facility];  Service: Urology;  Laterality: N/A;  . KNEE ARTHROSCOPY Right 2008  . LIVER SURGERY    . MULTIPLE EXTRACTIONS WITH ALVEOLOPLASTY N/A 03/24/2018   Procedure: EXTRACTIONS X 9;  Surgeon: Diona Browner, DDS;  Location: [facility];  Service: Oral Surgery;  Laterality: N/A;  . SMALL INTESTINE SURGERY    . WISDOM TOOTH EXTRACTION      Vitals:   06/13/20 0854 06/13/20 0856  BP: 111/83 111/83  Pulse: (!) 111 (!) 124     Subjective Assessment - 06/13/20 0853    Subjective Dizziness over the weekend but not currently.  Pt reports having a slight HA today but thinks it is because earlier this morning she got very hot; sometimes happens  after she gets iron.    Pertinent History Lupus, Grave's Disease, Sjogran Disease, Migraines, Iron Deficiency Anemia.    Limitations Standing;Walking    Currently in Pain? Yes              [facility] PT Assessment - 06/13/20 0901      Assessment   Medical Diagnosis Vertigo    Referring Provider (PT) Irine Seal, MD    Hand Dominance Right    Prior Therapy Prior Therapy for surgery of Knee      Precautions   Precautions Other (comment)    Precaution Comments Neuromodulator in the S2-3 area of spine. Colostomy Bag      Prior Function   Level of Independence Independent      Observation/Other Assessments   Other Surveys  Dizziness Handicap Inventory (DHI)    Dizziness Handicap Inventory (DHI)  22/100 mild      Functional Gait  Assessment   Gait assessed  Yes    Gait Level Surface Walks 20 ft in less than 5.5 sec, no assistive  devices, good speed, no evidence for imbalance, normal gait pattern, deviates no more than 6 in outside of the 12 in walkway width.    Change in Gait Speed Able to smoothly change walking speed without loss of balance or gait deviation. Deviate no more than 6 in outside of the 12 in walkway width.   dizziness   Gait with Horizontal Head Turns Performs head turns smoothly with slight change in gait velocity (eg, minor disruption to smooth gait path), deviates 6-10 in outside 12 in walkway width, or uses an assistive device.    Gait with Vertical Head Turns Performs task with slight change in gait velocity (eg, minor disruption to smooth gait path), deviates 6 - 10 in outside 12 in walkway width or uses assistive device    Gait and Pivot Turn Pivot turns safely within 3 sec and stops quickly with no loss of balance.   dizziness   Step Over Obstacle Is able to step over 2 stacked shoe boxes taped together (9 in total height) without changing gait speed. No evidence of imbalance.    Gait with Narrow Base of Support Is able to ambulate for 10 steps heel to toe with no staggering.    Gait with Eyes Closed Walks 20 ft, no assistive devices, good speed, no evidence of imbalance, normal gait pattern, deviates no more than 6 in outside 12 in walkway width. Ambulates 20 ft in less than 7 sec.    Ambulating Backwards Walks 20 ft, no assistive devices, good speed, no evidence for imbalance, normal gait    Steps Alternating feet, no rail.    Total Score 28    FGA comment: 28/30 - low falls risk               Vestibular Assessment - 06/13/20 0917      Visual Acuity   Static 9    Dynamic 5   4 line difference     Positional Sensitivities   Nose to Right Knee No dizziness    Right Knee to Sitting No dizziness    Nose to Left Knee No dizziness    Left Knee to Sitting No dizziness    Head Turning x 5 Mild dizziness    Head Nodding x 5 No dizziness    Pivot Right in Standing No dizziness   pressure in  ears   Pivot Left in Standing Lightheadedness   whoosh feeling  Positional Sensitivities Comments HR 104 bpm after performing re-assessments.                            PT Education - 06/13/20 [phone]    Education Details progress towards goals; areas to continue to focus on    Person(s) Educated Patient    Methods Explanation    Comprehension Verbalized understanding            PT Short Term Goals - 06/10/20 1116      PT SHORT TERM GOAL #1   Title Patient will initiate vestibular/balance HEP (All STGS due after 3 Visits)    Baseline HEP established, completing 3-4x/week    Time 3    Period --   visits   Status Partially Met    Target Date 05/19/20      PT SHORT TERM GOAL #2   Title Patient will undergo further vestibular assessment (DVA, MSQ, DHI, FGA)    Baseline Further assessment completed    Time 3    Period --   visits   Status Achieved    Target Date 05/19/20             PT Long Term Goals - 06/13/20 0924      PT LONG TERM GOAL #1   Title Patient will be independent with final vestibular/balance HEP (All LTGS 06/15/20)    Baseline Is performing current HEP    Time 9    Period --   visits   Status Achieved      PT LONG TERM GOAL #2   Title Patient will improve DHI by >/= 10 points to demonstrate improvements in symptoms and QoL    Baseline 24/100 > 22/100    Time 9    Period --   visits   Status Not Met      PT LONG TERM GOAL #3   Title Patient will improve to </= 2 line difference with DVA to show improved function of VOR    Baseline 6 line difference > 4 line difference    Time 9    Period --   visits   Status Partially Met      PT LONG TERM GOAL #4   Title --            New goals for recertification:  PT Short Term Goals - 06/13/20 0946      PT SHORT TERM GOAL #1   Title = LTG           PT Long Term Goals - 06/13/20 0946      PT LONG TERM GOAL #1   Title Patient will be independent with upgraded/final  vestibular/balance HEP    Baseline Is performing current HEP    Time 6    Period Weeks   visits   Status Revised      PT LONG TERM GOAL #2   Title Patient will improve DHI to </= 12/100 to demonstrate improvements in symptoms and QoL    Baseline 24/100 > 22/100    Time 6    Period Weeks   visits   Status Revised      PT LONG TERM GOAL #3   Title Patient will improve to </= 3 line difference with DVA to show improved function of VOR    Baseline 6 line difference > 4 line difference    Time 6    Period Weeks   visits  Status Revised      PT LONG TERM GOAL #4   Title Pt will report 0/5 on MSQ for bending down to the ground, repeated head movements and with body turns    Baseline 2/5 repeated head movements, 1/5 body turns    Time 6    Period Weeks    Status New      PT LONG TERM GOAL #5   Title Pt will improve FGA score to >/= 29/30 with no dizziness with changes in gait speed or with head/body turns    Baseline 28/30: dizziness with head turns, body turns and changes in gait speed    Time 6    Period Weeks    Status New                 Plan - 06/13/20 0939    Clinical Impression Statement Pt is making good progress towards goals and has met 1/3 LTG and demonstrates independence with most current/updated HEP.  Pt did not meet [place] goal but does report improvement in motion sensitivity, dizziness severity and improvement in overall function.  Pt also demonstrates improved use of VOR as indicated by decrease in DVA from 6 line > 4 line difference; pt partially met DVA goal.  Pt also demonstrates improved dynamic standing balance and decreased falls risk as indicated by 7 point improvement in FGA.  Pt will benefit from continued skilled PT services to continue to address vestibular and balance impairments to further maximize functional mobility independence and further decrease falls risk.    Personal Factors and Comorbidities Comorbidity 3+    Comorbidities Lupus, Grave's  Disease, Sjogran Disease, Migraines, Iron Deficiency Anemia    Examination-Activity Limitations Bend;Lift;Stand;Locomotion Level;Caring for Others    Examination-Participation Restrictions Occupation;Community Activity;Driving    Stability/Clinical Decision Making Evolving/Moderate complexity    PT Frequency 1x / week    PT Duration 6 weeks    PT Treatment/Interventions ADLs/Self Care Home Management;Canalith Repostioning;Gait training;Stair training;Functional mobility training;Therapeutic activities;Therapeutic exercise;Balance training;Neuromuscular re-education;Patient/family education;Vestibular;Passive range of motion;Manual techniques    PT Next Visit Plan Progress and upgrade HEP.  Focus on turns.  Continue to try incorporate compensatory saccades into functional movement.    Consulted and Agree with Plan of Care Patient           Patient will benefit from skilled therapeutic intervention in order to improve the following deficits and impairments:  Decreased balance,Difficulty walking,Dizziness,Pain,Decreased activity tolerance,Impaired sensation  Visit Diagnosis: Dizziness and giddiness  Unsteadiness on feet  Other abnormalities of gait and mobility     Problem List Patient Active Problem List   Diagnosis Date Noted  . Abdominal pain 04/14/2020  . Hypokalemia 04/14/2020  . Hypomagnesemia 04/14/2020  . Acute lower UTI 04/14/2020  . SBO (small bowel obstruction) ([place]) 04/14/2020  . Small bowel obstruction ([place])   . Paresthesia 04/07/2019  . Chronic migraine 04/07/2019  . Hypersensitivity reaction 01/13/2016  . Iron deficiency anemia due to chronic blood loss 01/05/2016  . Chronic interstitial cystitis with hematuria   . H/O Graves' disease 07/22/2013  . Sjogren's disease ([place]) 07/22/2013  . Malabsorption syndrome 07/22/2013  . Migraine with aura 07/22/2013  . Pelvic adhesive disease 07/22/2013  . Hypothyroidism complicating pregnancy / delivered 12/28/2011     Rico Junker, PT, DPT 06/13/20    9:45 AM    [facility] [address] [place], [place], [address] Phone: [phone]   Fax:  [phone]  Name: [name] MRN: [id number]  Date of Birth: Oct 26, 1990

## 2020-06-15 ENCOUNTER — Ambulatory Visit: Payer: Medicaid Other

## 2020-06-28 ENCOUNTER — Ambulatory Visit: Payer: Medicaid Other | Admitting: Physical Therapy

## 2020-07-05 ENCOUNTER — Ambulatory Visit: Payer: Medicaid Other | Attending: Internal Medicine | Admitting: Physical Therapy

## 2020-07-05 ENCOUNTER — Other Ambulatory Visit: Payer: Self-pay

## 2020-07-05 ENCOUNTER — Encounter: Payer: Self-pay | Admitting: Physical Therapy

## 2020-07-05 DIAGNOSIS — R2689 Other abnormalities of gait and mobility: Secondary | ICD-10-CM | POA: Insufficient documentation

## 2020-07-05 DIAGNOSIS — R42 Dizziness and giddiness: Secondary | ICD-10-CM | POA: Insufficient documentation

## 2020-07-05 DIAGNOSIS — R2681 Unsteadiness on feet: Secondary | ICD-10-CM | POA: Insufficient documentation

## 2020-07-05 NOTE — Therapy (Signed)
[place] [address] [place], [place], [address] Phone: [phone]   Fax:  [phone]  Physical Therapy Treatment  Patient Details  Name: [name] MRN: [id number] Date of Birth: [birth year] Referring Provider (PT): Irine Seal, MD   Encounter Date: 07/05/2020   PT End of Session - 07/05/20 1049    Visit Number 10    Number of Visits 12    Date for PT Re-Evaluation --   POC for 3 weeks   Authorization Type Medicaid - awaiting approval for 6 more visits (submitted 06/13/20)    Authorization Time Period 07-05-20 - 07-25-20    Authorization - Visit Number 1    Authorization - Number of Visits 3    PT Start Time 0852    PT Stop Time 0931    PT Time Calculation (min) 39 min    Activity Tolerance Patient tolerated treatment well    Behavior During Therapy [facility] for tasks assessed/performed           Past Medical History:  Diagnosis Date  . Anemia   . Anxiety   . Arthritis   . Bilateral hearing loss   . Bladder mass   . Chronic interstitial cystitis   . Chronic interstitial cystitis with hematuria    2016  . Chronic sinusitis   . Fibromyalgia   . GERD (gastroesophageal reflux disease)   . Gross hematuria   . History of Graves' disease    tx done in 2007  . History of migraine   . History of recurrent UTIs   . Hypothyroidism   . Iron deficiency anemia    due to SGS  . Lower urinary tract symptoms (LUTS)   . Lupus [facility])    rheumologist--  dr Abel Presto devashwer  . Malabsorption syndrome   . Migraine   . Migraine   . Passage of loose stools    chronic --  due to short gut syndrome  . Pleuritis   . S/P radioactive iodine thyroid ablation    2007  . SGS (short gut syndrome)   . Short gut syndrome   . Sjogren's disease ([place])   . Vitiligo   . Wears glasses     Past Surgical History:  Procedure Laterality Date  . BOWEL RESECTION  newborn   necrotizing enterocolitis (liver patched)  .  BRONCHOSCOPY  2014  . CESAREAN SECTION  01/02/2012   Procedure: CESAREAN SECTION;  Surgeon: Lovenia Kim, MD;  Location: [facility];  Service: Gynecology;  Laterality: N/A;  . COLONOSCOPY  07-29-2013  . CYSTECTOMY W/ URETEROILEAL CONDUIT  10/2017  . CYSTOSCOPY WITH BIOPSY N/A 11/23/2014   Procedure: CYSTOSCOPY WITH BLADDER  BIOPSY AND FULGERATION;  Surgeon: Irine Seal, MD;  Location: [facility];  Service: Urology;  Laterality: N/A;  . KNEE ARTHROSCOPY Right 2008  . LIVER SURGERY    . MULTIPLE EXTRACTIONS WITH ALVEOLOPLASTY N/A 03/24/2018   Procedure: EXTRACTIONS X 9;  Surgeon: Diona Browner, DDS;  Location: [place];  Service: Oral Surgery;  Laterality: N/A;  . SMALL INTESTINE SURGERY    . WISDOM TOOTH EXTRACTION      There were no vitals filed for this visit.   Subjective Assessment - 07/05/20 1034    Subjective Pt reports she saw Dr. Benjamine Mola on Monday, 06-20-20: states he thinks she may have Meniere's disease - states he is going to do further testing at upcoming appt on Jan. 18, 2022.  Pt reports minimal dizziness at  current time and does report having a headache at this time, but states she has been rushing around this morning    Pertinent History Lupus, Grave's Disease, Sjogran Disease, Migraines, Iron Deficiency Anemia.    Limitations Standing;Walking    Currently in Pain? Yes    Pain Score 4     Pain Location Head    Pain Descriptors / Indicators Headache                             [facility] Adult PT Treatment/Exercise - 07/05/20 0001      Transfers   Transfers Sit to Stand;Stand to Sit    Number of Reps 10 reps    Comments feet on Airex - 5 reps with EO and 5 reps with EC with CGA for balance      Self-Care   Self-Care Other Self-Care Comments    Other Self-Care Comments  Pt was given information on Meniere's disease from [facility] and also the handout on Hydrops and Meniere's disease on diet recommendations                Balance Exercises - 07/05/20 0914      Balance Exercises: Standing   Standing Eyes Opened Narrow base of support (BOS);Wide (BOA);Head turns;Foam/compliant surface;5 reps   horizontal and vertical head turns   Standing Eyes Closed Narrow base of support (BOS);Wide (BOA);Head turns;Foam/compliant surface;5 reps   horizontal and vertical head turns   Marching Foam/compliant surface;Head turns;Static;5 reps   horizontal and vertical head turns - EO and EC with CGA   Other Standing Exercises pt performed amb. making circles with ball clockwise and then counterclockwise 40' x 1 rep each direction with CGA    Other Standing Exercises Comments Pt performed standing on 2 pillows in corner - trunk rotations 5 reps with EO; then diagonal patterns "X" with EO 5 reps and then with EC 5 reps; also straight across pattern with EO and EC with CGA             PT Education - 07/05/20 1046    Education Details pt given information on Meniere's disease and also given handout on Hydrops and Diet recommendations for Meniere's disease    Person(s) Educated Patient    Methods Explanation;Handout    Comprehension Verbalized understanding            PT Short Term Goals - 06/13/20 0946      PT SHORT TERM GOAL #1   Title = LTG             PT Long Term Goals - 07/05/20 1056      PT LONG TERM GOAL #1   Title Patient will be independent with upgraded/final vestibular/balance HEP    Baseline Is performing current HEP    Time 6    Period Weeks   visits   Status Revised      PT LONG TERM GOAL #2   Title Patient will improve DHI to </= 12/100 to demonstrate improvements in symptoms and QoL    Baseline 24/100 > 22/100    Time 6    Period Weeks   visits   Status Revised      PT LONG TERM GOAL #3   Title Patient will improve to </= 3 line difference with DVA to show improved function of VOR    Baseline 6 line difference > 4 line difference    Time 6    Period  Weeks   visits   Status Revised       PT LONG TERM GOAL #4   Title Pt will report 0/5 on MSQ for bending down to the ground, repeated head movements and with body turns    Baseline 2/5 repeated head movements, 1/5 body turns    Time 6    Period Weeks    Status New      PT LONG TERM GOAL #5   Title Pt will improve FGA score to >/= 29/30 with no dizziness with changes in gait speed or with head/body turns    Baseline 28/30: dizziness with head turns, body turns and changes in gait speed    Time 6    Period Weeks    Status New                 Plan - 07/05/20 0902    Clinical Impression Statement Pt progressing well towards goals; does continue to have some postural instability with activities with EC on compliant surfaces.  Pt continues to c/o headache and some ear pain/fullness sensation with balance on foam with head turns with EC but able to continue with exercises.  Pt states she will be undergoing testing for Meniere's disease by Dr. Suszanne Conners on 07-19-20.  Cont with POC.    Personal Factors and Comorbidities Comorbidity 3+    Comorbidities Lupus, Grave's Disease, Sjogran Disease, Migraines, Iron Deficiency Anemia    Examination-Activity Limitations Bend;Lift;Stand;Locomotion Level;Caring for Others    Examination-Participation Restrictions Occupation;Community Activity;Driving    Stability/Clinical Decision Making Evolving/Moderate complexity    PT Frequency 1x / week    PT Duration 6 weeks    PT Treatment/Interventions ADLs/Self Care Home Management;Canalith Repostioning;Gait training;Stair training;Functional mobility training;Therapeutic activities;Therapeutic exercise;Balance training;Neuromuscular re-education;Patient/family education;Vestibular;Passive range of motion;Manual techniques    PT Next Visit Plan Progress and upgrade HEP.  Focus on turns.  Continue to try incorporate compensatory saccades into functional movement.    Consulted and Agree with Plan of Care Patient           Patient will benefit from  skilled therapeutic intervention in order to improve the following deficits and impairments:  Decreased balance,Difficulty walking,Dizziness,Pain,Decreased activity tolerance,Impaired sensation  Visit Diagnosis: Dizziness and giddiness  Unsteadiness on feet  Other abnormalities of gait and mobility     Problem List Patient Active Problem List   Diagnosis Date Noted  . Abdominal pain 04/14/2020  . Hypokalemia 04/14/2020  . Hypomagnesemia 04/14/2020  . Acute lower UTI 04/14/2020  . SBO (small bowel obstruction) (HCC) 04/14/2020  . Small bowel obstruction (HCC)   . Paresthesia 04/07/2019  . Chronic migraine 04/07/2019  . Hypersensitivity reaction 01/13/2016  . Iron deficiency anemia due to chronic blood loss 01/05/2016  . Chronic interstitial cystitis with hematuria   . H/O Graves' disease 07/22/2013  . Sjogren's disease (HCC) 07/22/2013  . Malabsorption syndrome 07/22/2013  . Migraine with aura 07/22/2013  . Pelvic adhesive disease 07/22/2013  . Hypothyroidism complicating pregnancy / delivered 12/28/2011    Kary Kos, PT 07/05/2020, 10:58 AM  [facility] [address] [place], [place], [address] Phone: [phone]   Fax:  [phone]  Name: [name] MRN: [id number] Date of Birth: Feb 14, 1991

## 2020-07-06 ENCOUNTER — Encounter: Payer: Medicaid Other | Admitting: Registered"

## 2020-07-06 ENCOUNTER — Encounter: Payer: Self-pay | Admitting: Registered"

## 2020-07-06 ENCOUNTER — Encounter: Payer: Medicaid Other | Attending: Physician Assistant | Admitting: Registered"

## 2020-07-06 DIAGNOSIS — Z713 Dietary counseling and surveillance: Secondary | ICD-10-CM | POA: Diagnosis not present

## 2020-07-06 DIAGNOSIS — R636 Underweight: Secondary | ICD-10-CM | POA: Diagnosis not present

## 2020-07-06 NOTE — Patient Instructions (Signed)
-   Use herbs and spices such as onion powder, garlic powder, Mrs. Dash instead of salt to add flavor to food.   - Continue having 3 meals + 2 snacks (Ensure) daily.

## 2020-07-06 NOTE — Progress Notes (Signed)
Medical Nutrition Therapy:  Appt start time: 9:01 end time: 9:55  Patient was seen on 07/06/2020 for nutrition counseling pertaining to disordered eating  Primary care provider: Rueben Bash, PA Therapist: Mathis Dad (sees weekly)  ROI: 10/14/2018 Any other medical team members: none    Assessment:    States she cooked shrimp alfredo pasta for Christmas. Able to handle pasta better since previous appointment.   States she has been limiting sodium intake to help with ears.  States was recommended to limit sodium to 150 mg per item because she would lose hearing. States in the past, she has been using salt on everything. States she had cold and sinus infection last week. Mainly ate fruit and stayed hydrated. Didn't drink Ensure last week while sick. Has resumed Ensure this week. States last week she had a bad headache which made her nauseous and didn't feel like eating everything. Had a fever last Wednesday.   Reports she hit a milestone yesterday and no longer caring about her weight. States she has not been active lately due to not feeling well as result of lupus flare ups and fibromyalgia.  Pt expectations: something she can do to help gain more weight  Previous appts: States she has started taking MCT, take 3 every full meal. Reports increased intake, improved dizziness, and improvements with chest pain/heart racing. Pt states she lives with parents and 83 year old daughter. Pt states she was taking MVI and supplements prior to last GI procedure and still vitamin deficient. Was deficient prior to procedures due to absorption and diarrhea. Due to large part of intestines being removed. Pt reports being deficient in B vitamins, fat-soluble vitamins, zinc, and selenium.   Pt reports having feeding tube from infancy to 7.32 years old. Pt reports restricting food since 30 years old; no spicy, no dairy, no greasy foods because it would cause her to vomit.   Pt states she has had over half  intestines removed plus more to help with bladder. Pt states bladder pain has improved but she still has some stomach issues.   Pt reports being able to tolerate well: soup, rice, baked chicken, seafood, not a big fan of meat, fruit. Pt states she also does not tolerate dairy or watermelon well.   Recent weight: 102 (pt reported from home), +1.7 lbs from 100.3 5 months ago (10/06/2019); our office scale is broken today  Medical Information:  Changes in hair, skin, nails since ED started: drier skin, some hair loss (believes it is due to medication changes) Chewing/swallowing difficulties: no Relux or heartburn: sometimes: not more than usual, takes reflux medications  Trouble with teeth: no LMP without the use of hormones: 3/14  Weight at that point: N/A Constipation, diarrhea: nothing unusual Dizziness/lightheadedness: no, only once last week Headaches/body aches: on and off Heart racing/chest pain: no Mood: better Sleep: about 5 hrs/night Focus/concentration: has trouble remembering things Cold intolerance: yes Vision changes: no  Mental health diagnosis:   Dietary assessment: A typical day consists of 3 meals and 2 snacks  Safe foods include: herbal tea, sweet tea, fruit smoothies, fruit, yogurt, rice, shrimp Avoided foods include: red meat, fish, bread, and cashews  24 hour recall:  B (6:15 AM): [place]-grilled chicken English muffin  Snk ( AM): Ensure L (12:30 PM): watermelon wedge Snk (4 PM): Ensure D (5 PM): tomato-based vegetable soup (tomatoes, carrots, peas, okra, green beans) + water   S (3 AM: lemonade  Beverages: water (2*8 oz; 16 oz), Ensure (2*8 oz; 16  oz), lemonade (4 oz); ~36 oz  Usual physical activity: none reported; walks dog daily  What Methods Do You Use To Control Your Weight (Compensatory behaviors)?           Restricting (calories, fat, carbs)  Food rules or rituals-eats to gain weight, weighs about  6 times/day to see how weight  fluctuates   Estimated energy intake: 1100-1200 kcals  Estimated energy needs: 2000-2200 calories 225-248g carbohydrates 150-165 g protein 56-61 g fat  Nutritional Diagnosis:  NB-1.5 Disordered eating pattern As related to skipping meals.  As evidenced by dietary recall.    Intervention:  Nutrition education and counseling. Discussed importance of continuing to nourish body and eat food as tolerated. Encouraged pt related to doing eating throughout the day and not focusing on her weight as much. Pt was in agreement with goals.  Goals: - Use herbs and spices such as onion powder, garlic powder, Mrs. Dash instead of salt to add flavor to food.  - Continue having 3 meals + 2 snacks (Ensure) daily.     Teaching Method Utilized:  Visual Auditory Hands on  Handouts given during visit include:  none  Barriers to learning/adherence to lifestyle change: none identified  Demonstrated degree of understanding via:  Teach Back   Monitoring/Evaluation:  Dietary intake, exercise, and body weight in 4 week(s).

## 2020-07-12 ENCOUNTER — Other Ambulatory Visit: Payer: Self-pay

## 2020-07-12 ENCOUNTER — Ambulatory Visit: Payer: Medicaid Other | Admitting: Physical Therapy

## 2020-07-12 DIAGNOSIS — R42 Dizziness and giddiness: Secondary | ICD-10-CM

## 2020-07-12 DIAGNOSIS — R2689 Other abnormalities of gait and mobility: Secondary | ICD-10-CM

## 2020-07-12 DIAGNOSIS — R2681 Unsteadiness on feet: Secondary | ICD-10-CM

## 2020-07-13 ENCOUNTER — Encounter: Payer: Self-pay | Admitting: Physical Therapy

## 2020-07-13 NOTE — Therapy (Signed)
[facility] [address] [place], [place], [address] Phone: [phone]   Fax:  [phone]  Physical Therapy Treatment  Patient Details  Name: [name] MRN: [id number] Date of Birth: Sep 11, 1990 Referring Provider (PT): Ramiro Harvest, MD   Encounter Date: 07/12/2020   PT End of Session - 07/13/20 1926    Visit Number 11    Number of Visits 12    Date for PT Re-Evaluation --   POC for 3 weeks   Authorization Type Medicaid - awaiting approval for 6 more visits (submitted 06/13/20)    Authorization Time Period 07-05-20 - 07-25-20    Authorization - Visit Number 2    Authorization - Number of Visits 3    PT Start Time 0845    PT Stop Time 0930    PT Time Calculation (min) 45 min    Activity Tolerance Patient tolerated treatment well    Behavior During Therapy [facility] for tasks assessed/performed           Past Medical History:  Diagnosis Date  . Anemia   . Anxiety   . Arthritis   . Bilateral hearing loss   . Bladder mass   . Chronic interstitial cystitis   . Chronic interstitial cystitis with hematuria    2016  . Chronic sinusitis   . Fibromyalgia   . GERD (gastroesophageal reflux disease)   . Gross hematuria   . History of Graves' disease    tx done in 2007  . History of migraine   . History of recurrent UTIs   . Hypothyroidism   . Iron deficiency anemia    due to SGS  . Lower urinary tract symptoms (LUTS)   . Lupus [facility])    rheumologist--  dr Janalyn Rouse devashwer  . Malabsorption syndrome   . Migraine   . Migraine   . Passage of loose stools    chronic --  due to short gut syndrome  . Pleuritis   . S/P radioactive iodine thyroid ablation    2007  . SGS (short gut syndrome)   . Short gut syndrome   . Sjogren's disease (HCC)   . Vitiligo   . Wears glasses     Past Surgical History:  Procedure Laterality Date  . BOWEL RESECTION  newborn   necrotizing enterocolitis (liver patched)  .  BRONCHOSCOPY  2014  . CESAREAN SECTION  01/02/2012   Procedure: CESAREAN SECTION;  Surgeon: Lenoard Aden, MD;  Location: [facility];  Service: Gynecology;  Laterality: N/A;  . COLONOSCOPY  07-29-2013  . CYSTECTOMY W/ URETEROILEAL CONDUIT  10/2017  . CYSTOSCOPY WITH BIOPSY N/A 11/23/2014   Procedure: CYSTOSCOPY WITH BLADDER  BIOPSY AND FULGERATION;  Surgeon: Bjorn Pippin, MD;  Location: [facility];  Service: Urology;  Laterality: N/A;  . KNEE ARTHROSCOPY Right 2008  . LIVER SURGERY    . MULTIPLE EXTRACTIONS WITH ALVEOLOPLASTY N/A 03/24/2018   Procedure: EXTRACTIONS X 9;  Surgeon: Ocie Doyne, DDS;  Location: [facility];  Service: Oral Surgery;  Laterality: N/A;  . SMALL INTESTINE SURGERY    . WISDOM TOOTH EXTRACTION      There were no vitals filed for this visit.                      [facility] Adult PT Treatment/Exercise - 07/13/20 0001      Ambulation/Gait   Ambulation/Gait Yes    Ambulation/Gait Assistance 6: Modified independent (Device/Increase time)  Ambulation Distance (Feet) 115 Feet    Assistive device None    Gait Pattern Step-through pattern   very narrow BOS   Ambulation Surface Level;Indoor               Balance Exercises - 07/13/20 0001      Balance Exercises: Standing   Standing Eyes Opened Narrow base of support (BOS);Wide (BOA);Head turns;Foam/compliant surface;5 reps   horizontal and vertical head turns   Standing Eyes Closed Narrow base of support (BOS);Wide (BOA);Head turns;Foam/compliant surface;5 reps   horizontal and vertical head turns   Rockerboard Anterior/posterior;Head turns;EO;EC;10 reps    Partial Tandem Stance Eyes open;2 reps;30 secs   each foot position - slow horizontal head turns 5 reps with CGA   Marching Foam/compliant surface;Head turns;Static;5 reps   horizontal and vertical head turns - EO and EC with CGA   Other Standing Exercises pt performed amb. making circles with ball clockwise and then  counterclockwise 40' x 1 rep each direction with CGA    Other Standing Exercises Comments Pt performed standing on 2 pillows in corner - trunk rotations 5 reps with EO; then diagonal patterns "X" with EO 5 reps and then with EC 5 reps; also straight across pattern with EO and EC with CGA               PT Short Term Goals - 07/13/20 1931      PT SHORT TERM GOAL #1   Title = LTG             PT Long Term Goals - 07/13/20 1931      PT LONG TERM GOAL #1   Title Patient will be independent with upgraded/final vestibular/balance HEP    Baseline Is performing current HEP    Time 6    Period Weeks   visits   Status Revised      PT LONG TERM GOAL #2   Title Patient will improve DHI to </= 12/100 to demonstrate improvements in symptoms and QoL    Baseline 24/100 > 22/100    Time 6    Period Weeks   visits   Status Revised      PT LONG TERM GOAL #3   Title Patient will improve to </= 3 line difference with DVA to show improved function of VOR    Baseline 6 line difference > 4 line difference    Time 6    Period Weeks   visits   Status Revised      PT LONG TERM GOAL #4   Title Pt will report 0/5 on MSQ for bending down to the ground, repeated head movements and with body turns    Baseline 2/5 repeated head movements, 1/5 body turns    Time 6    Period Weeks    Status New      PT LONG TERM GOAL #5   Title Pt will improve FGA score to >/= 29/30 with no dizziness with changes in gait speed or with head/body turns    Baseline 28/30: dizziness with head turns, body turns and changes in gait speed    Time 6    Period Weeks    Status New                 Plan - 07/13/20 1928    Clinical Impression Statement Pt progressing well towards goals - pt c/o pressure in head/ incr. ear pain with leaning over to pick up cones off floor; pt 's  balance is good overall - pt does have some unsteadiness with standing on compliant surface with EC, indicative of decr. vestibular input.     Personal Factors and Comorbidities Comorbidity 3+    Comorbidities Lupus, Grave's Disease, Sjogran Disease, Migraines, Iron Deficiency Anemia    Examination-Activity Limitations Bend;Lift;Stand;Locomotion Level;Caring for Others    Examination-Participation Restrictions Occupation;Community Activity;Driving    Stability/Clinical Decision Making Evolving/Moderate complexity    PT Frequency 1x / week    PT Duration 6 weeks    PT Treatment/Interventions ADLs/Self Care Home Management;Canalith Repostioning;Gait training;Stair training;Functional mobility training;Therapeutic activities;Therapeutic exercise;Balance training;Neuromuscular re-education;Patient/family education;Vestibular;Passive range of motion;Manual techniques    PT Next Visit Plan D/C next session.    Consulted and Agree with Plan of Care Patient           Patient will benefit from skilled therapeutic intervention in order to improve the following deficits and impairments:  Decreased balance,Difficulty walking,Dizziness,Pain,Decreased activity tolerance,Impaired sensation  Visit Diagnosis: Dizziness and giddiness  Unsteadiness on feet  Other abnormalities of gait and mobility     Problem List Patient Active Problem List   Diagnosis Date Noted  . Abdominal pain 04/14/2020  . Hypokalemia 04/14/2020  . Hypomagnesemia 04/14/2020  . Acute lower UTI 04/14/2020  . SBO (small bowel obstruction) (HCC) 04/14/2020  . Small bowel obstruction (HCC)   . Paresthesia 04/07/2019  . Chronic migraine 04/07/2019  . Hypersensitivity reaction 01/13/2016  . Iron deficiency anemia due to chronic blood loss 01/05/2016  . Chronic interstitial cystitis with hematuria   . H/O Graves' disease 07/22/2013  . Sjogren's disease (HCC) 07/22/2013  . Malabsorption syndrome 07/22/2013  . Migraine with aura 07/22/2013  . Pelvic adhesive disease 07/22/2013  . Hypothyroidism complicating pregnancy / delivered 12/28/2011    Kary Kos, PT 07/13/2020, 7:32 PM  [facility] [address] [place], [place], [address] Phone: [phone]   Fax:  [phone]  Name: [name] MRN: [id number] Date of Birth: 07/07/1990

## 2020-07-19 ENCOUNTER — Ambulatory Visit: Payer: Medicaid Other | Admitting: Physical Therapy

## 2020-07-21 ENCOUNTER — Ambulatory Visit: Payer: Medicaid Other | Admitting: Physical Therapy

## 2020-07-21 ENCOUNTER — Other Ambulatory Visit: Payer: Self-pay

## 2020-07-21 ENCOUNTER — Encounter: Payer: Self-pay | Admitting: Physical Therapy

## 2020-07-21 DIAGNOSIS — R42 Dizziness and giddiness: Secondary | ICD-10-CM | POA: Diagnosis not present

## 2020-07-21 DIAGNOSIS — R2689 Other abnormalities of gait and mobility: Secondary | ICD-10-CM

## 2020-07-21 NOTE — Progress Notes (Signed)
   07/21/20 0001  Functional Gait  Assessment  Gait assessed  Yes  Gait Level Surface 3  Change in Gait Speed 3  Gait with Horizontal Head Turns 2  Gait with Vertical Head Turns 2  Gait and Pivot Turn 3 (dizziness reported with the turn)  Step Over Obstacle 3  Gait with Narrow Base of Support 3  Gait with Eyes Closed 3  Ambulating Backwards 3  Steps 3  Total Score 28  FGA comment: 28/30 - low falls risk

## 2020-07-21 NOTE — Therapy (Signed)
[facility] [address] [place], [place], [address] Phone: [phone]   Fax:  [phone]  Physical Therapy Treatment & Discharge Summary  Patient Details  Name: [name] MRN: [id number] Date of Birth: Sep 19, 1990 Referring Provider (PT): Irine Seal, MD   Encounter Date: 07/21/2020   PT End of Session - 07/21/20 1415    Visit Number 12    Number of Visits 12    Date for PT Re-Evaluation --   POC for 3 weeks   Authorization Type Medicaid - awaiting approval for 6 more visits (submitted 06/13/20)    Authorization Time Period 07-05-20 - 07-25-20    Authorization - Visit Number 3    Authorization - Number of Visits 3    PT Start Time 0931    PT Stop Time 1010    PT Time Calculation (min) 39 min    Activity Tolerance Patient tolerated treatment well    Behavior During Therapy [facility] for tasks assessed/performed           Past Medical History:  Diagnosis Date  . Anemia   . Anxiety   . Arthritis   . Bilateral hearing loss   . Bladder mass   . Chronic interstitial cystitis   . Chronic interstitial cystitis with hematuria    2016  . Chronic sinusitis   . Fibromyalgia   . GERD (gastroesophageal reflux disease)   . Gross hematuria   . History of Graves' disease    tx done in 2007  . History of migraine   . History of recurrent UTIs   . Hypothyroidism   . Iron deficiency anemia    due to SGS  . Lower urinary tract symptoms (LUTS)   . Lupus [facility])    rheumologist--  dr Abel Presto devashwer  . Malabsorption syndrome   . Migraine   . Migraine   . Passage of loose stools    chronic --  due to short gut syndrome  . Pleuritis   . S/P radioactive iodine thyroid ablation    2007  . SGS (short gut syndrome)   . Short gut syndrome   . Sjogren's disease ([place])   . Vitiligo   . Wears glasses     Past Surgical History:  Procedure Laterality Date  . BOWEL RESECTION  newborn   necrotizing enterocolitis  (liver patched)  . BRONCHOSCOPY  2014  . CESAREAN SECTION  01/02/2012   Procedure: CESAREAN SECTION;  Surgeon: Lovenia Kim, MD;  Location: [facility];  Service: Gynecology;  Laterality: N/A;  . COLONOSCOPY  07-29-2013  . CYSTECTOMY W/ URETEROILEAL CONDUIT  10/2017  . CYSTOSCOPY WITH BIOPSY N/A 11/23/2014   Procedure: CYSTOSCOPY WITH BLADDER  BIOPSY AND FULGERATION;  Surgeon: Irine Seal, MD;  Location: [facility];  Service: Urology;  Laterality: N/A;  . KNEE ARTHROSCOPY Right 2008  . LIVER SURGERY    . MULTIPLE EXTRACTIONS WITH ALVEOLOPLASTY N/A 03/24/2018   Procedure: EXTRACTIONS X 9;  Surgeon: Diona Browner, DDS;  Location: [facility];  Service: Oral Surgery;  Laterality: N/A;  . SMALL INTESTINE SURGERY    . WISDOM TOOTH EXTRACTION      There were no vitals filed for this visit.   Subjective Assessment - 07/21/20 0935    Subjective Pt reports dizziness has been bad this past week but states she changed her diet so that may be contributing to the increase    Pertinent History Lupus, Grave's Disease, Sjogran Disease, Migraines, Iron Deficiency  Anemia.    Limitations Standing;Walking    Currently in Pain? No/denies              [facility] PT Assessment - 07/21/20 0001      Functional Gait  Assessment   Gait assessed  Yes (P)     Gait Level Surface Walks 20 ft in less than 5.5 sec, no assistive devices, good speed, no evidence for imbalance, normal gait pattern, deviates no more than 6 in outside of the 12 in walkway width. (P)     Change in Gait Speed Able to smoothly change walking speed without loss of balance or gait deviation. Deviate no more than 6 in outside of the 12 in walkway width. (P)     Gait with Horizontal Head Turns Performs head turns smoothly with slight change in gait velocity (eg, minor disruption to smooth gait path), deviates 6-10 in outside 12 in walkway width, or uses an assistive device. (P)     Gait with Vertical Head Turns Performs  task with slight change in gait velocity (eg, minor disruption to smooth gait path), deviates 6 - 10 in outside 12 in walkway width or uses assistive device (P)     Gait and Pivot Turn Pivot turns safely within 3 sec and stops quickly with no loss of balance. (P)    dizziness reported with the turn   Step Over Obstacle Is able to step over 2 stacked shoe boxes taped together (9 in total height) without changing gait speed. No evidence of imbalance. (P)     Gait with Narrow Base of Support Is able to ambulate for 10 steps heel to toe with no staggering. (P)     Gait with Eyes Closed Walks 20 ft, no assistive devices, good speed, no evidence of imbalance, normal gait pattern, deviates no more than 6 in outside 12 in walkway width. Ambulates 20 ft in less than 7 sec. (P)     Ambulating Backwards Walks 20 ft, no assistive devices, good speed, no evidence for imbalance, normal gait (P)     Steps Alternating feet, no rail. (P)     Total Score 28 (P)     FGA comment: 28/30 - low falls risk (P)                Vestibular Assessment - 07/21/20 0938      Positional Sensitivities   Sit to Supine No dizziness    Supine to Left Side No dizziness    Supine to Right Side No dizziness    Supine to Sitting Lightheadedness    Nose to Right Knee No dizziness    Right Knee to Sitting Mild dizziness    Nose to Left Knee Lightheadedness    Left Knee to Sitting No dizziness    Head Turning x 5 No dizziness    Head Nodding x 5 Mild dizziness    Pivot Right in Standing Mild dizziness    Pivot Left in Standing Mild dizziness    Rolling Right No dizziness    Rolling Left No dizziness                DVA - 3.5 line difference from SVA - Line 10 SVA;  Pt read 1/2 line 6            PT Education - 07/21/20 1413    Education Details reviewed LTG's and progress; educated pt in [facility] score and interpretation of this questionnaire; reviewed HEP    Person(s) Educated  Patient    Methods Explanation     Comprehension Verbalized understanding            PT Short Term Goals - 07/13/20 1931      PT SHORT TERM GOAL #1   Title = LTG             PT Long Term Goals - 07/21/20 0258      PT LONG TERM GOAL #1   Title Patient will be independent with upgraded/final vestibular/balance HEP    Baseline Is performing current HEP;  pt reports no ?'s with HEP - 07-21-20    Time 6    Period Weeks   visits   Status Achieved      PT LONG TERM GOAL #2   Title Patient will improve DHI to </= 12/100 to demonstrate improvements in symptoms and QoL    Baseline 24/100 > 22/100;   score 16% DHI on 07-21-20    Time 6    Period Weeks   visits   Status Not Met      PT LONG TERM GOAL #3   Title Patient will improve to </= 3 line difference with DVA to show improved function of VOR    Baseline 6 line difference > 4 line difference;  line 10 SVA; read 1/2 line 7 and then stated it became a blur - 4 line difference for DVA    Time 6    Period Weeks   visits   Status Not Met      PT LONG TERM GOAL #4   Title Pt will report 0/5 on MSQ for bending down to the ground, repeated head movements and with body turns    Baseline 2/5 repeated head movements, 1/5 body turns; pt continues to c/o dizziness with bending over, return to upright and with head movements - 07-21-20    Time 6    Period Weeks    Status Not Met      PT LONG TERM GOAL #5   Title Pt will improve FGA score to >/= 29/30 with no dizziness with changes in gait speed or with head/body turns    Baseline 28/30: dizziness with head turns, body turns and changes in gait speed; score remains 28/30 due to slight deviation in path with amb. with head turns - 07-21-20    Time 6    Period Weeks    Status Not Met                  Patient will benefit from skilled therapeutic intervention in order to improve the following deficits and impairments:     Visit Diagnosis: Dizziness and giddiness  Other abnormalities of gait and  mobility     Problem List Patient Active Problem List   Diagnosis Date Noted  . Abdominal pain 04/14/2020  . Hypokalemia 04/14/2020  . Hypomagnesemia 04/14/2020  . Acute lower UTI 04/14/2020  . SBO (small bowel obstruction) ([place]) 04/14/2020  . Small bowel obstruction ([place])   . Paresthesia 04/07/2019  . Chronic migraine 04/07/2019  . Hypersensitivity reaction 01/13/2016  . Iron deficiency anemia due to chronic blood loss 01/05/2016  . Chronic interstitial cystitis with hematuria   . H/O Graves' disease 07/22/2013  . Sjogren's disease ([place]) 07/22/2013  . Malabsorption syndrome 07/22/2013  . Migraine with aura 07/22/2013  . Pelvic adhesive disease 07/22/2013  . Hypothyroidism complicating pregnancy / delivered 12/28/2011      PHYSICAL THERAPY DISCHARGE SUMMARY  Visits from Start of Care:  12  Current functional level related to goals / functional outcomes: See above for progress towards goals - pt states dizziness varies from day to day - states "some days and good and some are not"  Remaining deficits: Continued c/o dizziness with bending over, return to upright, quick turns and with head turns (vertical head turns provoke more dizziness than horizontal)   Education / Equipment: Pt has been instructed in HEP for vestibular exercises, including gaze stabilization;  Pt reports her ENT (Dr. Benjamine Mola) is doing further diagnostic testing/work-up to determine if she has Meniere's disease.  Pt was given handout on diet recommendations in regards to hydrops & Meniere's disease (pt education info handout from [employer])  Plan: Patient agrees to discharge.  Patient goals were not met. Patient is being discharged due to lack of progress.  ?????        No further treatment sessions authorized at this time by pt's insurance - did not request additional auth. Due to lack of progress.   DAPTCK, FWBLT GAIDKSM, PT 07/21/2020, 2:29 PM  [facility] [address] [place], [place], [address] Phone: [phone]   Fax:  [phone]  Name: JUELLE DICKMANN MRN: [id number] Date of Birth: 1991/03/16

## 2020-07-26 ENCOUNTER — Ambulatory Visit: Payer: Medicaid Other | Admitting: Physical Therapy

## 2020-08-02 ENCOUNTER — Ambulatory Visit: Payer: Medicaid Other | Admitting: Physical Therapy

## 2020-08-03 ENCOUNTER — Other Ambulatory Visit: Payer: Self-pay

## 2020-08-03 ENCOUNTER — Encounter: Payer: Medicaid Other | Attending: Physician Assistant | Admitting: Registered"

## 2020-08-03 ENCOUNTER — Encounter: Payer: Self-pay | Admitting: Registered"

## 2020-08-03 DIAGNOSIS — Z713 Dietary counseling and surveillance: Secondary | ICD-10-CM | POA: Diagnosis not present

## 2020-08-03 NOTE — Patient Instructions (Signed)
-   Aim to eat within 2 hours of waking up.   - Breakfast can include: fruit + PB crackers (6 pack)

## 2020-08-03 NOTE — Progress Notes (Signed)
Medical Nutrition Therapy:  Appt start time: 9:03 end time: 9:55  Patient was seen on 08/03/2020 for nutrition counseling pertaining to disordered eating  Primary care provider: Rueben Bash, PA Therapist: Mathis Dad (sees weekly)  ROI: 10/14/2018 Any other medical team members: none    Assessment:    States she had cut out dairy and meats in Dec 2021. Reports she has been limiting sugar, salt, and caffeine intake. Reports she feels like she was swelling less, having less stomach cramps, and less vomiting. States she feels better. States she been having rice between or will have Ensure when feeling headache starting. States she has been going to bed earlier around 7:30 - 3:45 am (7 hrs/night). States she is fasting from 4-6 am for general cleansing and trying to figure out what makes her feel better. States she takes medicine at 6 am and then eats at 8:15 am after taking daughter to school.   States it bothers her when people constantly make comments about how much she is eating, not eating, and her weight changes.   Pt expectations: something she can do to help gain more weight  Previous appts: Reports she hit a milestone yesterday and no longer caring about her weight. States she has not been active lately due to not feeling well as result of lupus flare ups and fibromyalgia. States she has started taking MCT, take 3 every full meal. Reports increased intake, improved dizziness, and improvements with chest pain/heart racing. Pt states she lives with parents and 78 year old daughter. Pt states she was taking MVI and supplements prior to last GI procedure and still vitamin deficient. Was deficient prior to procedures due to absorption and diarrhea. Due to large part of intestines being removed. Pt reports being deficient in B vitamins, fat-soluble vitamins, zinc, and selenium.   Pt reports having feeding tube from infancy to 55.32 years old. Pt reports restricting food since 30 years old; no  spicy, no dairy, no greasy foods because it would cause her to vomit.   Pt states she has had over half intestines removed plus more to help with bladder. Pt states bladder pain has improved but she still has some stomach issues.   Pt reports being able to tolerate well: soup, rice, baked chicken, seafood, not a big fan of meat, fruit. Pt states she also does not tolerate dairy or watermelon well.   Recent weight: 97.8 102 (pt reported from home), +1.7 lbs from 100.3 5 months ago (10/06/2019); our office scale is broken today  Medical Information:  Changes in hair, skin, nails since ED started: drier skin, some hair loss (believes it is due to medication changes) Chewing/swallowing difficulties: no Relux or heartburn: sometimes: not more than usual, takes reflux medications  Trouble with teeth: no LMP without the use of hormones: 3/14  Weight at that point: N/A Constipation, diarrhea: nothing unusual Dizziness/lightheadedness: no, only once last week Headaches/body aches: on and off Heart racing/chest pain: no Mood: better Sleep: about 5 hrs/night Focus/concentration: has trouble remembering things Cold intolerance: yes Vision changes: no  Mental health diagnosis:   Dietary assessment: A typical day consists of 3 meals and 2 snacks  Safe foods include: herbal tea, sweet tea, fruit smoothies, fruit, yogurt, rice, shrimp Avoided foods include: red meat, fish, bread, and cashews  24 hour recall:  B (6:15 AM): herbal tea or [place]-grilled chicken English muffin  Snk (8 AM): fruit + crackers L (2 PM): steamed veggies + rice  Snk (4 PM): PB  crackers D (5 PM): pasta + oil + veggies + black beans (sometimes)  S (8 PM): Ensure  Beverages: water (2*8 oz; 16 oz), Ensure (2*8 oz; 16 oz), lemonade (4 oz); ~36 oz  Usual physical activity: none reported; walks dog daily  What Methods Do You Use To Control Your Weight (Compensatory behaviors)?           Restricting (calories, fat,  carbs)  Food rules or rituals-eats to gain weight, weighs about  6 times/day to see how weight fluctuates   Estimated energy intake: 1000-1100 kcals  Estimated energy needs: 2000-2200 calories 225-248g carbohydrates 150-165 g protein 56-61 g fat  Nutritional Diagnosis:  NB-1.5 Disordered eating pattern As related to skipping meals.  As evidenced by dietary recall.    Intervention:  Nutrition education and counseling. Discussed importance of continuing to nourish body and eat food as tolerated. Discussed voluntarily fasting and talking through disordered eating thoughts arising. Encouraged pt related to not focusing on her weight. Pt was in agreement with goals.  Goals: - Aim to eat within 2 hours of waking up.  - Breakfast can include: fruit + PB crackers (6 pack)    Teaching Method Utilized:  Visual Auditory Hands on  Handouts given during visit include:  none  Barriers to learning/adherence to lifestyle change: none identified  Demonstrated degree of understanding via:  Teach Back   Monitoring/Evaluation:  Dietary intake, exercise, and body weight in 4 week(s).

## 2020-08-07 ENCOUNTER — Other Ambulatory Visit: Payer: Self-pay

## 2020-08-07 ENCOUNTER — Encounter (HOSPITAL_BASED_OUTPATIENT_CLINIC_OR_DEPARTMENT_OTHER): Payer: Self-pay | Admitting: Emergency Medicine

## 2020-08-07 ENCOUNTER — Emergency Department (HOSPITAL_BASED_OUTPATIENT_CLINIC_OR_DEPARTMENT_OTHER): Payer: Medicaid Other

## 2020-08-07 ENCOUNTER — Emergency Department (HOSPITAL_BASED_OUTPATIENT_CLINIC_OR_DEPARTMENT_OTHER)
Admission: EM | Admit: 2020-08-07 | Discharge: 2020-08-07 | Disposition: A | Discharge status: Home / Self Care | Payer: Medicaid Other | Attending: Emergency Medicine | Admitting: Emergency Medicine

## 2020-08-07 DIAGNOSIS — E039 Hypothyroidism, unspecified: Secondary | ICD-10-CM | POA: Insufficient documentation

## 2020-08-07 DIAGNOSIS — Z79899 Other long term (current) drug therapy: Secondary | ICD-10-CM | POA: Insufficient documentation

## 2020-08-07 DIAGNOSIS — S93402A Sprain of unspecified ligament of left ankle, initial encounter: Secondary | ICD-10-CM

## 2020-08-07 DIAGNOSIS — M25572 Pain in left ankle and joints of left foot: Secondary | ICD-10-CM

## 2020-08-07 DIAGNOSIS — W1842XA Slipping, tripping and stumbling without falling due to stepping into hole or opening, initial encounter: Secondary | ICD-10-CM | POA: Insufficient documentation

## 2020-08-07 DIAGNOSIS — S99912A Unspecified injury of left ankle, initial encounter: Secondary | ICD-10-CM | POA: Diagnosis present

## 2020-08-07 NOTE — Discharge Instructions (Addendum)
At this time there does not appear to be the presence of an emergent medical condition, however there is always the potential for conditions to change. Please read and follow the below instructions.  Please return to the Emergency Department immediately for any new or worsening symptoms. Please be sure to follow up with your Primary Care Provider within one week regarding your visit today; please call their office to schedule an appointment even if you are feeling better for a follow-up visit. Please call the sports medicine specialist Dr. Jordan Likes on your discharge paperwork for a follow-up appointment for further evaluation and treatment of your ankle pain.  Go to the nearest Emergency Department immediately if: You have fever or chills Your foot, leg, toes, or ankle: Tingles or becomes numb. Becomes swollen. Turns pale or blue. You have any new/concerning or worsening of symptoms  Please read the additional information packets attached to your discharge summary.  Do not take your medicine if  develop an itchy rash, swelling in your mouth or lips, or difficulty breathing; call 911 and seek immediate emergency medical attention if this occurs.  You may review your lab tests and imaging results in their entirety on your MyChart account.  Please discuss all results of fully with your primary care provider and other specialist at your follow-up visit.  Note: Portions of this text may have been transcribed using voice recognition software. Every effort was made to ensure accuracy; however, inadvertent computerized transcription errors may still be present.

## 2020-08-07 NOTE — ED Triage Notes (Signed)
L ankle pain and swelling since yesterday. No known injury.

## 2020-08-07 NOTE — ED Provider Notes (Signed)
[facility] EMERGENCY DEPARTMENT Provider Note   CSN: [id number] Arrival date & time: 08/07/20  1253     History Chief Complaint  Patient presents with  . Ankle Pain    [name] is a 30 y.o. female history of GERD, lupus, fibromyalgia, migraines, vitiligo.  Patient reports compliance with all of her daily medications  Patient arrives for left ankle pain onset yesterday morning.  Patient reports Friday night she was out drinking some wine, she does not remember any injury of her ankle.  Reports that when she got up out of bed yesterday morning and stepped down she noticed some pain to her left lateral malleolus pain is aching only present when placing weight on the ankle or with external rotation, pain improved with rest and pain will occasionally radiate up the portion of her leg.  She reports that she has some swelling yesterday but swelling has resolved.  Denies fever/chills, fall, numbness/tingling, weakness, color change, wound or any additional concerns.  HPI     Past Medical History:  Diagnosis Date  . Anemia   . Anxiety   . Arthritis   . Bilateral hearing loss   . Bladder mass   . Chronic interstitial cystitis   . Chronic interstitial cystitis with hematuria    2016  . Chronic sinusitis   . Fibromyalgia   . GERD (gastroesophageal reflux disease)   . Gross hematuria   . History of Graves' disease    tx done in 2007  . History of migraine   . History of recurrent UTIs   . Hypothyroidism   . Iron deficiency anemia    due to SGS  . Lower urinary tract symptoms (LUTS)   . Lupus [facility])    rheumologist--  dr Janalyn Rouse devashwer  . Malabsorption syndrome   . Migraine   . Migraine   . Passage of loose stools    chronic --  due to short gut syndrome  . Pleuritis   . S/P radioactive iodine thyroid ablation    2007  . SGS (short gut syndrome)   . Short gut syndrome   . Sjogren's disease (HCC)   . Vitiligo   . Wears glasses     Patient Active  Problem List   Diagnosis Date Noted  . Abdominal pain 04/14/2020  . Hypokalemia 04/14/2020  . Hypomagnesemia 04/14/2020  . Acute lower UTI 04/14/2020  . SBO (small bowel obstruction) (HCC) 04/14/2020  . Small bowel obstruction (HCC)   . Paresthesia 04/07/2019  . Chronic migraine 04/07/2019  . Hypersensitivity reaction 01/13/2016  . Iron deficiency anemia due to chronic blood loss 01/05/2016  . Chronic interstitial cystitis with hematuria   . H/O Graves' disease 07/22/2013  . Sjogren's disease (HCC) 07/22/2013  . Malabsorption syndrome 07/22/2013  . Migraine with aura 07/22/2013  . Pelvic adhesive disease 07/22/2013  . Hypothyroidism complicating pregnancy / delivered 12/28/2011    Past Surgical History:  Procedure Laterality Date  . BOWEL RESECTION  newborn   necrotizing enterocolitis (liver patched)  . BRONCHOSCOPY  2014  . CESAREAN SECTION  01/02/2012   Procedure: CESAREAN SECTION;  Surgeon: Lenoard Aden, MD;  Location: [facility];  Service: Gynecology;  Laterality: N/A;  . COLONOSCOPY  07-29-2013  . CYSTECTOMY W/ URETEROILEAL CONDUIT  10/2017  . CYSTOSCOPY WITH BIOPSY N/A 11/23/2014   Procedure: CYSTOSCOPY WITH BLADDER  BIOPSY AND FULGERATION;  Surgeon: Bjorn Pippin, MD;  Location: [facility];  Service: Urology;  Laterality: N/A;  . KNEE ARTHROSCOPY  Right 2008  . LIVER SURGERY    . MULTIPLE EXTRACTIONS WITH ALVEOLOPLASTY N/A 03/24/2018   Procedure: EXTRACTIONS X 9;  Surgeon: Ocie DoyneJensen, Scott, DDS;  Location: [facility];  Service: Oral Surgery;  Laterality: N/A;  . SMALL INTESTINE SURGERY    . WISDOM TOOTH EXTRACTION       OB History    Gravida  3   Para  1   Term  1   Preterm  0   AB  1   Living  1     SAB  0   IAB  0   Ectopic  1   Multiple  0   Live Births  1           Family History  Problem Relation Age of Onset  . Diabetes Father   . Endometriosis Mother   . Fibromyalgia Mother   . Heart disease Maternal  Grandmother   . Hypertension Maternal Grandmother   . Osteoporosis Maternal Grandmother   . Heart disease Maternal Grandfather   . Hypertension Maternal Grandfather   . Prostate cancer Maternal Grandfather   . Heart disease Paternal Grandmother   . Other Neg Hx   . Colon cancer Neg Hx   . Pancreatic cancer Neg Hx   . Stomach cancer Neg Hx     Social History   Tobacco Use  . Smoking status: Never Smoker  . Smokeless tobacco: Never Used  Substance Use Topics  . Alcohol use: No    Alcohol/week: 0.0 standard drinks  . Drug use: No    Home Medications Prior to Admission medications   Medication Sig Start Date End Date Taking? Authorizing Provider  AMBULATORY NON FORMULARY MEDICATION Iron Inf[facility]    [provider]  cetirizine (ZYRTEC) 5 MG tablet Take 5 mg by mouth daily.    [provider]  cevimeline (EVOXAC) 30 MG capsule Take 1 capsule by mouth 3 (three) times daily as needed.  04/28/19   [provider]  cyclobenzaprine (FLEXERIL) 10 MG tablet Take 1 tablet (10 mg total) by mouth 2 (two) times daily as needed for muscle spasms. 08/10/17   Robinson, [place] N, PA-C  diphenoxylate-atropine (LOMOTIL) 2.5-0.025 MG tablet One tablet twice a day Patient not taking: Reported on 04/20/2020 03/08/17   Meredith PelGuenther, Paula M, NP  eletriptan (RELPAX) 40 MG tablet Take 1 tablet (40 mg total) by mouth as needed for migraine or headache. May repeat in 2 hours if headache persists or recurs. Patient not taking: Reported on 04/20/2020 04/07/19   Levert FeinsteinYan, Yijun, MD  EPINEPHrine 0.3 mg/0.3 mL IJ SOAJ injection Inject 0.3 mg into the muscle as needed for anaphylaxis.  03/31/19   [provider]  Erenumab-aooe (AIMOVIG) 70 MG/ML SOAJ Inject 70 mg into the skin at bedtime. 11/09/19   Levert FeinsteinYan, Yijun, MD  hydroxychloroquine (PLAQUENIL) 200 MG tablet Take 400 mg by mouth every morning.     [provider]   hydroxypropyl methylcellulose / hypromellose (ISOPTO TEARS / GONIOVISC) 2.5 % ophthalmic solution Place 2 drops into both eyes 3 (three) times daily as needed for dry eyes.  04/27/19   [provider]  levothyroxine (SYNTHROID) 100 MCG tablet Take 175 mcg by mouth daily.  02/27/17   [provider]  levothyroxine (SYNTHROID) 175 MCG tablet Take 175 mcg by mouth daily before breakfast.    [provider]  omalizumab Geoffry Paradise(XOLAIR) 150 MG/ML prefilled syringe Inject 150  mg into the skin every 28 (twenty-eight) days.     [provider]  OMEPRAZOLE PO Take 20 mg by mouth at bedtime.     [provider]  ondansetron (ZOFRAN-ODT) 8 MG disintegrating tablet Take 1 tablet (8 mg total) by mouth every 8 (eight) hours as needed. 11/09/19   Levert Feinstein, MD  polyethylene glycol (MIRALAX / GLYCOLAX) 17 g packet Take 17 g by mouth daily. Hold if you develop diarrhea and then use as needed. Patient not taking: Reported on 04/20/2020 04/19/20   Rodolph Bong, MD  predniSONE (DELTASONE) 5 MG tablet Take 1 tablet by mouth daily. 04/27/19   [provider]  propranolol ER (INDERAL LA) 80 MG 24 hr capsule Take 1 capsule (80 mg total) by mouth at bedtime. 02/09/20   Glean Salvo, NP  QUEtiapine (SEROQUEL) 300 MG tablet Take 300 mg by mouth at bedtime.    [provider]  SUMAtriptan (IMITREX) 6 MG/0.5ML SOLN injection Inject 0.5 mLs (6 mg total) into the skin every 2 (two) hours as needed for migraine or headache. May repeat in 2 hours if headache persists or recurs. 02/09/20   Glean Salvo, NP  tiZANidine (ZANAFLEX) 4 MG tablet Take 1 tablet (4 mg total) by mouth every 8 (eight) hours as needed for muscle spasms. 11/09/19   Levert Feinstein, MD  White Petrolatum-Mineral Oil [facility] PETROL-MINERAL OIL-LANOLIN) 0.1-0.1 % OINT Place 1 application into both eyes at bedtime. 04/28/19   [provider]    Allergies    Penicillins, Buprenorphine hcl, Morphine and  related, and Hydrocodone-acetaminophen  Review of Systems   Review of Systems  Constitutional: Negative.  Negative for chills and fever.  Musculoskeletal: Positive for arthralgias (Left Ankle). Negative for back pain and joint swelling (Resolved).  Skin: Negative.  Negative for color change and wound.  Neurological: Negative.  Negative for weakness and numbness.   Physical Exam Updated Vital Signs BP 95/70 (BP Location: Left Arm)   Pulse 90   Temp 98.4 F (36.9 C) (Oral)   Resp 17   Ht 5' (1.524 m)   Wt 43.9 kg   LMP 08/01/2020   SpO2 98%   BMI 18.90 kg/m   Physical Exam Constitutional:      General: She is not in acute distress.    Appearance: Normal appearance. She is well-developed. She is not ill-appearing or diaphoretic.  HENT:     Head: Normocephalic and atraumatic.  Eyes:     General: Vision grossly intact. Gaze aligned appropriately.     Pupils: Pupils are equal, round, and reactive to light.  Neck:     Trachea: Trachea and phonation normal.  Cardiovascular:     Pulses:          Dorsalis pedis pulses are 2+ on the right side and 2+ on the left side.       Posterior tibial pulses are 2+ on the right side and 2+ on the left side.  Pulmonary:     Effort: Pulmonary effort is normal. No respiratory distress.  Abdominal:     General: There is no distension.     Palpations: Abdomen is soft.     Tenderness: There is no abdominal tenderness. There is no guarding or rebound.  Musculoskeletal:        General: Normal range of motion.     Cervical back: Normal range of motion.       Feet:  Feet:     Right foot:  Protective Sensation: 5 sites tested. 5 sites sensed.     Skin integrity: Skin integrity normal.     Left foot:     Protective Sensation: 5 sites tested. 5 sites sensed.     Skin integrity: Skin integrity normal.     Comments: Left ankle: Lateral malleolus tenderness no overlying skin changes or swelling.  Some tenderness with eversion of the ankle no  pain with flexion extension inversion.  Capillary refill and sensation intact to all toes.  Strong equal pedal pulses.  Achilles tendon intact and no pain with palpation.  No swelling of the lower leg.  Full range of motion at the knees and toes bilaterally without pain.  Compartments soft. Skin:    General: Skin is warm and dry.  Neurological:     Mental Status: She is alert.     GCS: GCS eye subscore is 4. GCS verbal subscore is 5. GCS motor subscore is 6.     Comments: Speech is clear and goal oriented, follows commands Major Cranial nerves without deficit, no facial droop Moves extremities without ataxia, coordination intact  Psychiatric:        Behavior: Behavior normal.     ED Results / Procedures / Treatments   Labs (all labs ordered are listed, but only abnormal results are displayed) Labs Reviewed - No data to display  EKG None  Radiology DG Ankle Complete Left  Result Date: 08/07/2020 CLINICAL DATA:  Lateral ankle pain. EXAM: LEFT ANKLE COMPLETE - 3+ VIEW COMPARISON:  None. FINDINGS: There is no evidence of fracture, dislocation, or joint effusion. There is no evidence of arthropathy or other focal bone abnormality. Soft tissues are unremarkable. IMPRESSION: Negative. Electronically Signed   By: Ted Mcalpine M.D.   On: 08/07/2020 13:39    Procedures Procedures   Medications Ordered in ED Medications - No data to display  ED Course  I have reviewed the triage vital signs and the nursing notes.  Pertinent labs & imaging results that were available during my care of the patient were reviewed by me and considered in my medical decision making (see chart for details).    MDM Rules/Calculators/A&P                         Additional history obtained from: 1. Nursing notes from this visit. ---------------- DG Left Ankle:  IMPRESSION:  Negative.   30 year old female history as above presents with left lateral ankle pain examination is consistent with likely  sprain possibly of the anterior talofibular ligament versus calcaneofibular ligament.  There is no evidence for fracture/dislocation, septic arthritis, DVT, compartment syndrome, cellulitis, neurovascular compromise or other emergent pathologies.  Plan of care is ankle brace, crutches, nonweightbearing and follow-up with sports medicine along with rice therapy.  At this time there does not appear to be any evidence of an acute emergency medical condition and the patient appears stable for discharge with appropriate outpatient follow up. Diagnosis was discussed with patient who verbalizes understanding of care plan and is agreeable to discharge. I have discussed return precautions with patient who verbalizes understanding. Patient encouraged to follow-up with their PCP and sports medicine. All questions answered.  Note: Portions of this report may have been transcribed using voice recognition software. Every effort was made to ensure accuracy; however, inadvertent computerized transcription errors may still be present. Final Clinical Impression(s) / ED Diagnoses Final diagnoses:  Acute left ankle pain  Sprain of left ankle, unspecified ligament, initial encounter  Rx / DC Orders ED Discharge Orders    None       Elizabeth Palau 08/07/20 1417    Terald Sleeper, MD 08/07/20 1816

## 2020-08-07 NOTE — ED Notes (Signed)
Comfort measures provided to client, left foot elevated and ice pack applied. Radiology at bedside

## 2020-08-09 ENCOUNTER — Ambulatory Visit: Payer: Medicaid Other | Admitting: Family Medicine

## 2020-08-09 NOTE — Progress Notes (Deleted)
[name] - 30 y.o. female MRN [id number]  Date of birth: 1990-12-07  SUBJECTIVE:  Including CC & ROS.  No chief complaint on file.   [name] is a 30 y.o. female that is  ***.  ***   Review of Systems See HPI   HISTORY: Past Medical, Surgical, Social, and Family History Reviewed & Updated per EMR.   Pertinent Historical Findings include:  Past Medical History:  Diagnosis Date  . Anemia   . Anxiety   . Arthritis   . Bilateral hearing loss   . Bladder mass   . Chronic interstitial cystitis   . Chronic interstitial cystitis with hematuria    2016  . Chronic sinusitis   . Fibromyalgia   . GERD (gastroesophageal reflux disease)   . Gross hematuria   . History of Graves' disease    tx done in 2007  . History of migraine   . History of recurrent UTIs   . Hypothyroidism   . Iron deficiency anemia    due to SGS  . Lower urinary tract symptoms (LUTS)   . Lupus [facility])    rheumologist--  dr Janalyn Rouse devashwer  . Malabsorption syndrome   . Migraine   . Migraine   . Passage of loose stools    chronic --  due to short gut syndrome  . Pleuritis   . S/P radioactive iodine thyroid ablation    2007  . SGS (short gut syndrome)   . Short gut syndrome   . Sjogren's disease (HCC)   . Vitiligo   . Wears glasses     Past Surgical History:  Procedure Laterality Date  . BOWEL RESECTION  newborn   necrotizing enterocolitis (liver patched)  . BRONCHOSCOPY  2014  . CESAREAN SECTION  01/02/2012   Procedure: CESAREAN SECTION;  Surgeon: Lenoard Aden, MD;  Location: [facility];  Service: Gynecology;  Laterality: N/A;  . COLONOSCOPY  07-29-2013  . CYSTECTOMY W/ URETEROILEAL CONDUIT  10/2017  . CYSTOSCOPY WITH BIOPSY N/A 11/23/2014   Procedure: CYSTOSCOPY WITH BLADDER  BIOPSY AND FULGERATION;  Surgeon: Bjorn Pippin, MD;  Location: [facility];  Service: Urology;  Laterality: N/A;  . KNEE ARTHROSCOPY Right 2008  . LIVER SURGERY    . MULTIPLE EXTRACTIONS  WITH ALVEOLOPLASTY N/A 03/24/2018   Procedure: EXTRACTIONS X 9;  Surgeon: Ocie Doyne, DDS;  Location: [facility];  Service: Oral Surgery;  Laterality: N/A;  . SMALL INTESTINE SURGERY    . WISDOM TOOTH EXTRACTION      Family History  Problem Relation Age of Onset  . Diabetes Father   . Endometriosis Mother   . Fibromyalgia Mother   . Heart disease Maternal Grandmother   . Hypertension Maternal Grandmother   . Osteoporosis Maternal Grandmother   . Heart disease Maternal Grandfather   . Hypertension Maternal Grandfather   . Prostate cancer Maternal Grandfather   . Heart disease Paternal Grandmother   . Other Neg Hx   . Colon cancer Neg Hx   . Pancreatic cancer Neg Hx   . Stomach cancer Neg Hx     Social History   Socioeconomic History  . Marital status: Divorced    Spouse name: Not on file  . Number of children: 1  . Years of education: college  . Highest education level: Not on file  Occupational History  . Occupation: N/A  Tobacco Use  . Smoking status: Never Smoker  . Smokeless tobacco: Never Used  Substance and Sexual  Activity  . Alcohol use: No    Alcohol/week: 0.0 standard drinks  . Drug use: No  . Sexual activity: Not on file  Other Topics Concern  . Not on file  Social History Narrative   Lives at home with family.   Right-handed.   6 cups tea throughout the week.   Social Determinants of Health   Financial Resource Strain: Not on file  Food Insecurity: Not on file  Transportation Needs: Not on file  Physical Activity: Not on file  Stress: Not on file  Social Connections: Not on file  Intimate Partner Violence: Not on file     PHYSICAL EXAM:  VS: LMP 08/01/2020  Physical Exam Gen: NAD, alert, cooperative with exam, well-appearing MSK:  ***      ASSESSMENT & PLAN:   No problem-specific Assessment & Plan notes found for this encounter.

## 2020-08-11 ENCOUNTER — Ambulatory Visit: Payer: Medicaid Other | Admitting: Family Medicine

## 2020-08-11 ENCOUNTER — Ambulatory Visit: Payer: Medicaid Other | Admitting: Neurology

## 2020-08-11 ENCOUNTER — Other Ambulatory Visit: Payer: Self-pay

## 2020-08-11 ENCOUNTER — Encounter: Payer: Self-pay | Admitting: Neurology

## 2020-08-11 VITALS — BP 115/70 | HR 80 | Ht 60.0 in | Wt 100.0 lb

## 2020-08-11 DIAGNOSIS — G43109 Migraine with aura, not intractable, without status migrainosus: Secondary | ICD-10-CM | POA: Diagnosis not present

## 2020-08-11 DIAGNOSIS — R202 Paresthesia of skin: Secondary | ICD-10-CM

## 2020-08-11 MED ORDER — ONDANSETRON 8 MG PO TBDP: 8.0000 mg | ORAL_TABLET | Freq: Three times a day (TID) | ORAL | 6 refills | Status: AC | PRN

## 2020-08-11 MED ORDER — SUMATRIPTAN SUCCINATE 6 MG/0.5ML ~~LOC~~ SOLN: 6.0000 mg | SUBCUTANEOUS | 5 refills | Status: AC | PRN

## 2020-08-11 MED ORDER — ELETRIPTAN HYDROBROMIDE 40 MG PO TABS: 40.0000 mg | ORAL_TABLET | ORAL | 11 refills | Status: DC | PRN

## 2020-08-11 MED ORDER — TIZANIDINE HCL 4 MG PO TABS: 4.0000 mg | ORAL_TABLET | Freq: Three times a day (TID) | ORAL | 3 refills | Status: AC | PRN

## 2020-08-11 MED ORDER — PROPRANOLOL HCL ER 80 MG PO CP24: 80.0000 mg | ORAL_CAPSULE | Freq: Every day | ORAL | 11 refills | Status: AC

## 2020-08-11 MED ORDER — AIMOVIG 70 MG/ML ~~LOC~~ SOAJ: 70.0000 mg | Freq: Every evening | SUBCUTANEOUS | 11 refills | Status: AC

## 2020-08-11 NOTE — Patient Instructions (Signed)
Continue current medications  See you back in 1 year  

## 2020-08-11 NOTE — Progress Notes (Signed)
PATIENT: [name] DOB: 12/23/1990  REASON FOR VISIT: follow up HISTORY FROM: patient  HISTORY OF PRESENT ILLNESS: Today 08/11/20  HISTORY  [name] is a 30 year old female, seen in request by her primary care PA Rueben Bash for evaluation of chronic migraine, numbness, initial evaluation was on April 07, 2019.-  I have reviewed and summarized the referring note from the referring physician.  She had complicated past medical history, connective tissue disease, is taking Plaquenil 400 mg every morning, interstitial cystitis, hypothyroidism, on supplement, vitiligo  She reported a history of migraine headaches since age 87, her typical migraine started from occipital region, spreading forward, to become retro-orbital area severe pounding headache with associated light, noise sensitivity, nauseous, lasting few hours to 1 day, she now has migraine about 3 times each week, previously was treated with Topamax as preventive medication, only benefit her for a while and then stop, she was also treated with Botox in 2015, which was also helpful.  Previously she has tried Imitrex, Maxalt without significant improvement of her headache  She had long history of interstitial cystitis, eventually had ostomy,  Since 2013, following her C-section delivery of her child, began to notice numbness tingling at bilateral lower extremity, gradually getting worse, and also with bilateral hands involvement, she denies headache, has frequent diarrhea, she does complains of chronic neck pain.  Update Nov 09, 2019: Today her main concern is her frequent headaches, which started in middle school, she has the habit of taking over-the-counter medication on a daily basis over the years, previously was taking multiple dose of ibuprofen, but was anemic, since 2017, she has been taking daily Tylenol, about 4 tablets each day, for her almost daily bilateral pressure headaches, once a week she  would get more severe headache, severe pounding headache behind her eyes, with associated light noise sensitivity, Maxalt works well sometimes, often have to take second dose, her severe migraine last about 1 to 2 days  I personally reviewed CT head without contrast in 2013 that was normal  She is on polypharmacy treatment due to her mood disorder, this including Seroquel 300 mg every night, she continue complains of difficulty sleeping, I added on propanolol 80 mg daily for headache, which seems to help her some  She works as a Technical brewer, 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 Has been on Aimovig 70 mg since May, headaches are 25% better. Also, takes propranolol LA 80 mg daily. Continues to reports pain to left elbow, numbneess into left hand, feels tingling, can feel ache in her arm, from elbow down to fingers is ache, like running hands under hot water, then under cold, has spinal stimulator for sacral spine, can't have MRI, helps with pelvic pain, had her bladder removed. Still has tingling to right hand, but not arm, legs will still go numb at times.  Overall health is stable.  Is taking a break from working as a Technical brewer.  Never able to get Imitrex injection from pharmacy, has Relpax.  Update August 11, 2020 SS: Broke her left ankle  over the weekend, on crutches. Migraines are better, every 2 weeks, Changed her diet no salt, no sugars or meat, feels this has helped her headaches, no caffeine. Still on Aimovig and Inderal LA 80. Is  still out of work, left elbow pain hasn't been bothersome recently, feels tingling in extremities/aching with Lupus flare. Is in Vestibular rehab, helping with dizziness with head turning, quick motions can still trigger dizziness. In hospital October 2021 for SBO. Saw ENT last week, no abnormalities were indenitfied, he felt Meniere's Disease.   CT Cervical spine 02/12/2020 IMPRESSION: No significant degenerative disc or joint disease in the cervical spine.  REVIEW OF SYSTEMS: Out of a complete 14 system review of symptoms, the patient complains only of the following symptoms, and all other reviewed systems are negative.  Headache, numbness  ALLERGIES: Allergies  Allergen Reactions  . Penicillins Anaphylaxis  . Buprenorphine Hcl Itching  . Morphine And Related Itching  . Hydrocodone-Acetaminophen Rash    HOME MEDICATIONS: Outpatient Medications Prior to Visit  Medication Sig Dispense Refill  . AMBULATORY NON FORMULARY MEDICATION Iron Infusions every 3 [facility]    . cetirizine (ZYRTEC) 5 MG tablet Take 5 mg by mouth daily.    . cyclobenzaprine (FLEXERIL) 10 MG tablet Take 1 tablet (10 mg total) by mouth 2 (two) times daily as needed for muscle spasms. 14 tablet 0  . diphenoxylate-atropine (LOMOTIL) 2.5-0.025 MG tablet One tablet twice a day 60 tablet 0  . EPINEPHrine 0.3 mg/0.3 mL IJ SOAJ injection Inject 0.3 mg into the muscle as needed for anaphylaxis.     . hydroxychloroquine (PLAQUENIL) 200 MG tablet Take 400 mg by mouth every morning.     . hydroxypropyl methylcellulose / hypromellose (ISOPTO TEARS / GONIOVISC) 2.5 % ophthalmic solution Place 2 drops into both eyes 3 (three) times daily as needed for dry eyes.     Marland Kitchen. levothyroxine (SYNTHROID) 100 MCG tablet Take 112 mcg by mouth daily.    Marland Kitchen. omalizumab Geoffry Paradise(XOLAIR) 150 MG/ML prefilled syringe Inject 150 mg into the skin every 28 (twenty-eight) days.     . OMEPRAZOLE PO Take 20 mg by  mouth at bedtime.     . polyethylene glycol (MIRALAX / GLYCOLAX) 17 g packet Take 17 g by mouth daily. Hold if you develop diarrhea and then use as needed. 30 each 0  . predniSONE (DELTASONE) 5 MG tablet Take 1 tablet by mouth daily.    . QUEtiapine (SEROQUEL) 300 MG tablet Take 300 mg by mouth at bedtime.    Marland Kitchen. White Petrolatum-Mineral Oil (WH PETROL-MINERAL OIL-LANOLIN) 0.1-0.1 % OINT Place 1 application into both eyes at bedtime.    Marland Kitchen. eletriptan (RELPAX) 40 MG tablet Take 1 tablet (40 mg total) by mouth as needed for migraine or headache. May repeat in 2 hours if headache persists or recurs. 12 tablet 11  . Erenumab-aooe (AIMOVIG) 70 MG/ML SOAJ Inject 70 mg into the skin at bedtime. 1 pen 11  . ondansetron (ZOFRAN-ODT) 8 MG disintegrating tablet Take 1 tablet (8 mg total) by mouth every 8 (eight) hours as needed. 20 tablet 6  . propranolol ER (INDERAL LA) 80 MG 24 hr capsule Take 1 capsule (80 mg total) by mouth at bedtime. 30 capsule 11  . SUMAtriptan (IMITREX) 6 MG/0.5ML SOLN injection Inject 0.5 mLs (6 mg total) into the skin every 2 (two) hours as needed for migraine or headache. May repeat in 2 hours if headache persists or recurs. 6 mL  5  . tiZANidine (ZANAFLEX) 4 MG tablet Take 1 tablet (4 mg total) by mouth every 8 (eight) hours as needed for muscle spasms. 30 tablet 3  . cevimeline (EVOXAC) 30 MG capsule Take 1 capsule by mouth 3 (three) times daily as needed.     Marland Kitchen levothyroxine (SYNTHROID) 175 MCG tablet Take 175 mcg by mouth daily before breakfast.     No facility-administered medications prior to visit.    PAST MEDICAL HISTORY: Past Medical History:  Diagnosis Date  . Anemia   . Anxiety   . Arthritis   . Bilateral hearing loss   . Bladder mass   . Chronic interstitial cystitis   . Chronic interstitial cystitis with hematuria    2016  . Chronic sinusitis   . Fibromyalgia   . GERD (gastroesophageal reflux disease)   . Gross hematuria   . History of Graves' disease    tx  done in 2007  . History of migraine   . History of recurrent UTIs   . Hypothyroidism   . Iron deficiency anemia    due to SGS  . Lower urinary tract symptoms (LUTS)   . Lupus [facility])    rheumologist--  dr Janalyn Rouse devashwer  . Malabsorption syndrome   . Migraine   . Migraine   . Passage of loose stools    chronic --  due to short gut syndrome  . Pleuritis   . S/P radioactive iodine thyroid ablation    2007  . SGS (short gut syndrome)   . Short gut syndrome   . Sjogren's disease (HCC)   . Vitiligo   . Wears glasses     PAST SURGICAL HISTORY: Past Surgical History:  Procedure Laterality Date  . BOWEL RESECTION  newborn   necrotizing enterocolitis (liver patched)  . BRONCHOSCOPY  2014  . CESAREAN SECTION  01/02/2012   Procedure: CESAREAN SECTION;  Surgeon: Lenoard Aden, MD;  Location: [facility];  Service: Gynecology;  Laterality: N/A;  . COLONOSCOPY  07-29-2013  . CYSTECTOMY W/ URETEROILEAL CONDUIT  10/2017  . CYSTOSCOPY WITH BIOPSY N/A 11/23/2014   Procedure: CYSTOSCOPY WITH BLADDER  BIOPSY AND FULGERATION;  Surgeon: Bjorn Pippin, MD;  Location: [facility];  Service: Urology;  Laterality: N/A;  . KNEE ARTHROSCOPY Right 2008  . LIVER SURGERY    . MULTIPLE EXTRACTIONS WITH ALVEOLOPLASTY N/A 03/24/2018   Procedure: EXTRACTIONS X 9;  Surgeon: Ocie Doyne, DDS;  Location: [facility];  Service: Oral Surgery;  Laterality: N/A;  . SMALL INTESTINE SURGERY    . WISDOM TOOTH EXTRACTION      FAMILY HISTORY: Family History  Problem Relation Age of Onset  . Diabetes Father   . Endometriosis Mother   . Fibromyalgia Mother   . Heart disease Maternal Grandmother   . Hypertension Maternal Grandmother   . Osteoporosis Maternal Grandmother   . Heart disease Maternal Grandfather   . Hypertension Maternal Grandfather   . Prostate cancer Maternal Grandfather   . Heart disease Paternal Grandmother   . Other Neg Hx   . Colon cancer Neg Hx   . Pancreatic cancer  Neg Hx   . Stomach cancer Neg Hx     SOCIAL HISTORY: Social History   Socioeconomic History  . Marital status: Divorced    Spouse name: Not on file  . Number of children: 1  . Years of education: college  . Highest education level: Not on file  Occupational History  . Occupation: N/A  Tobacco Use  .  Smoking status: Never Smoker  . Smokeless tobacco: Never Used  Substance and Sexual Activity  . Alcohol use: No    Alcohol/week: 0.0 standard drinks  . Drug use: No  . Sexual activity: Not on file  Other Topics Concern  . Not on file  Social History Narrative   Lives at home with family.   Right-handed.   6 cups tea throughout the week.   Social Determinants of Health   Financial Resource Strain: Not on file  Food Insecurity: Not on file  Transportation Needs: Not on file  Physical Activity: Not on file  Stress: Not on file  Social Connections: Not on file  Intimate Partner Violence: Not on file   PHYSICAL EXAM  Vitals:   08/11/20 0808  BP: 115/70  Pulse: 80  Weight: 100 lb (45.4 kg)  Height: 5' (1.524 m)   Body mass index is 19.53 kg/m.  Generalized: Well developed, in no acute distress, skin color asymmetry diffusely Neurological examination  Mentation: Alert oriented to time, place, history taking. Follows all commands speech and language fluent Cranial nerve II-XII: Pupils were equal round reactive to light. Extraocular movements were full, visual field were full on confrontational test. Facial sensation and strength were normal. Head turning and shoulder shrug were normal and symmetric. Motor: The motor testing reveals 5 over 5 strength of all 4 extremities. Good symmetric motor tone is noted throughout.  Sensory: Sensory testing is intact to soft touch on all 4 extremities. No evidence of extinction is noted.  Coordination: Cerebellar testing reveals good finger-nose-finger and heel-to-shin bilaterally.  Gait and station: Limp on the left from injury,  crutches Reflexes: Deep tendon reflexes are symmetric and normal bilaterally.   DIAGNOSTIC DATA (LABS, IMAGING, TESTING) - I reviewed patient records, labs, notes, testing and imaging myself where available.  Lab Results  Component Value Date   WBC 4.0 05/11/2020   HGB 9.9 (L) 05/11/2020   HCT 30.6 (L) 05/11/2020   MCV 87.2 05/11/2020   PLT 166 05/11/2020      Component Value Date/Time   NA 138 04/27/2020 1102   K 4.3 04/27/2020 1102   CL 108 04/27/2020 1102   CO2 19 (L) 04/27/2020 1102   GLUCOSE 83 04/27/2020 1102   BUN 14 04/27/2020 1102   CREATININE 0.68 04/27/2020 1102   CALCIUM 9.3 04/27/2020 1102   PROT 7.6 04/27/2020 1102   ALBUMIN 3.9 04/27/2020 1102   AST 25 04/27/2020 1102   ALT 13 04/27/2020 1102   ALKPHOS 66 04/27/2020 1102   BILITOT 2.0 (H) 04/27/2020 1102   GFRNONAA >60 04/27/2020 1102   GFRAA >60 07/05/2019 1909   No results found for: CHOL, HDL, LDLCALC, LDLDIRECT, TRIG, CHOLHDL No results found for: DPOE4M Lab Results  Component Value Date   VITAMINB12 389 07/24/2017   Lab Results  Component Value Date   TSH 0.076 (L) 07/22/2013      ASSESSMENT AND PLAN 30 y.o. year old female  has a past medical history of Anemia, Anxiety, Arthritis, Bilateral hearing loss, Bladder mass, Chronic interstitial cystitis, Chronic interstitial cystitis with hematuria, Chronic sinusitis, Fibromyalgia, GERD (gastroesophageal reflux disease), Gross hematuria, History of Graves' disease, History of migraine, History of recurrent UTIs, Hypothyroidism, Iron deficiency anemia, Lower urinary tract symptoms (LUTS), Lupus (HCC), Malabsorption syndrome, Migraine, Migraine, Passage of loose stools, Pleuritis, S/P radioactive iodine thyroid ablation, SGS (short gut syndrome), Short gut syndrome, Sjogren's disease (HCC), Vitiligo, and Wears glasses. here with:  1. Chronic migraine headaches -Doing well, stable, migraine every  2 weeks -Continue Aimovig 70 mg monthly injection for  migraine prevention -Continue Inderal ER 80 mg daily for migraine prevention -Continue Relpax 40 mg as needed for moderate headache -Continue Imitrex injection for severe headache, may mix with Aleve, Zofran, tizanidine (resent this to pharmacy) -Previously tried and failed Topamax, is on Seroquel for mood disorder  2. 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  This included previsit chart review, lab review, study review, order entry, electronic health record documentation, patient education.  Margie Ege, AGNP-C, DNP 08/11/2020, 8:49 AM [facility] [address] [place], [place] [address] [phone]

## 2020-08-17 ENCOUNTER — Ambulatory Visit (INDEPENDENT_AMBULATORY_CARE_PROVIDER_SITE_OTHER): Payer: Medicaid Other | Admitting: Family Medicine

## 2020-08-17 ENCOUNTER — Ambulatory Visit: Payer: Self-pay

## 2020-08-17 ENCOUNTER — Other Ambulatory Visit: Payer: Self-pay

## 2020-08-17 VITALS — BP 108/80 | Ht 60.0 in | Wt 100.0 lb

## 2020-08-17 DIAGNOSIS — M84364A Stress fracture, left fibula, initial encounter for fracture: Secondary | ICD-10-CM | POA: Insufficient documentation

## 2020-08-17 DIAGNOSIS — M25572 Pain in left ankle and joints of left foot: Secondary | ICD-10-CM

## 2020-08-17 NOTE — Progress Notes (Signed)
[name] - 30 y.o. female MRN [id number]  Date of birth: 1990/11/06  SUBJECTIVE:  Including CC & ROS.  No chief complaint on file.   [name] is a 30 y.o. female that is presenting with left ankle pain.  Pain has been ongoing for a few weeks.  Denies any specific injury or inciting event.  Pain is worse with weightbearing and walking.  No history of stress fractures..  Independent review of the left ankle x-ray from 2/6 shows no acute changes.   Review of Systems See HPI   HISTORY: Past Medical, Surgical, Social, and Family History Reviewed & Updated per EMR.   Pertinent Historical Findings include:  Past Medical History:  Diagnosis Date  . Anemia   . Anxiety   . Arthritis   . Bilateral hearing loss   . Bladder mass   . Chronic interstitial cystitis   . Chronic interstitial cystitis with hematuria    2016  . Chronic sinusitis   . Fibromyalgia   . GERD (gastroesophageal reflux disease)   . Gross hematuria   . History of Graves' disease    tx done in 2007  . History of migraine   . History of recurrent UTIs   . Hypothyroidism   . Iron deficiency anemia    due to SGS  . Lower urinary tract symptoms (LUTS)   . Lupus [facility])    rheumologist--  dr Janalyn Rouse devashwer  . Malabsorption syndrome   . Migraine   . Migraine   . Passage of loose stools    chronic --  due to short gut syndrome  . Pleuritis   . S/P radioactive iodine thyroid ablation    2007  . SGS (short gut syndrome)   . Short gut syndrome   . Sjogren's disease (HCC)   . Vitiligo   . Wears glasses     Past Surgical History:  Procedure Laterality Date  . BOWEL RESECTION  newborn   necrotizing enterocolitis (liver patched)  . BRONCHOSCOPY  2014  . CESAREAN SECTION  01/02/2012   Procedure: CESAREAN SECTION;  Surgeon: Lenoard Aden, MD;  Location: [facility];  Service: Gynecology;  Laterality: N/A;  . COLONOSCOPY  07-29-2013  . CYSTECTOMY W/ URETEROILEAL CONDUIT  10/2017  . CYSTOSCOPY  WITH BIOPSY N/A 11/23/2014   Procedure: CYSTOSCOPY WITH BLADDER  BIOPSY AND FULGERATION;  Surgeon: Bjorn Pippin, MD;  Location: [facility];  Service: Urology;  Laterality: N/A;  . KNEE ARTHROSCOPY Right 2008  . LIVER SURGERY    . MULTIPLE EXTRACTIONS WITH ALVEOLOPLASTY N/A 03/24/2018   Procedure: EXTRACTIONS X 9;  Surgeon: Ocie Doyne, DDS;  Location: [facility];  Service: Oral Surgery;  Laterality: N/A;  . SMALL INTESTINE SURGERY    . WISDOM TOOTH EXTRACTION      Family History  Problem Relation Age of Onset  . Diabetes Father   . Endometriosis Mother   . Fibromyalgia Mother   . Heart disease Maternal Grandmother   . Hypertension Maternal Grandmother   . Osteoporosis Maternal Grandmother   . Heart disease Maternal Grandfather   . Hypertension Maternal Grandfather   . Prostate cancer Maternal Grandfather   . Heart disease Paternal Grandmother   . Other Neg Hx   . Colon cancer Neg Hx   . Pancreatic cancer Neg Hx   . Stomach cancer Neg Hx     Social History   Socioeconomic History  . Marital status: Divorced    Spouse name: Not on file  .  Number of children: 1  . Years of education: college  . Highest education level: Not on file  Occupational History  . Occupation: N/A  Tobacco Use  . Smoking status: Never Smoker  . Smokeless tobacco: Never Used  Substance and Sexual Activity  . Alcohol use: No    Alcohol/week: 0.0 standard drinks  . Drug use: No  . Sexual activity: Not on file  Other Topics Concern  . Not on file  Social History Narrative   Lives at home with family.   Right-handed.   6 cups tea throughout the week.   Social Determinants of Health   Financial Resource Strain: Not on file  Food Insecurity: Not on file  Transportation Needs: Not on file  Physical Activity: Not on file  Stress: Not on file  Social Connections: Not on file  Intimate Partner Violence: Not on file     PHYSICAL EXAM:  VS: BP 108/80 (BP Location:  Left Arm, Patient Position: Sitting, Cuff Size: Normal)   Ht 5' (1.524 m)   Wt 100 lb (45.4 kg)   LMP 08/01/2020   BMI 19.53 kg/m  Physical Exam Gen: NAD, alert, cooperative with exam, well-appearing MSK:  Left ankle: No swelling or ecchymosis. Tenderness palpation of the distal fibula. Normal strength resistance. Neurovascular intact  Limited ultrasound: Left ankle:  No joint effusion of the ankle joint. Normal-appearing peroneal tendons. Normal insertion of the Achilles tendon. Increased hyperemia surrounding the distal fibula on short axis which could indicate a stress fracture  Summary: Findings would suggest a stress fracture of the fibula.  Ultrasound and interpretation by Clare Gandy, MD    ASSESSMENT & PLAN:   Stress fracture of left fibula Findings would suggest a stress fracture of the distal fibula. -Counseled on home exercise therapy and supportive care. -Cam walker with heel lift. -Counseled on vitamin D and calcium. -Follow-up in 3 weeks.  Could consider physical therapy.

## 2020-08-17 NOTE — Patient Instructions (Signed)
Nice to meet you  Please use the boot when walking around Please try the exercises  Please use ice as needed Please send me a message in MyChart with any questions or updates.  Please see me back in 3 weeks.   --Dr. Jordan Likes

## 2020-08-17 NOTE — Assessment & Plan Note (Signed)
Findings would suggest a stress fracture of the distal fibula. -Counseled on home exercise therapy and supportive care. -Cam walker with heel lift. -Counseled on vitamin D and calcium. -Follow-up in 3 weeks.  Could consider physical therapy.

## 2020-08-18 ENCOUNTER — Telehealth: Payer: Self-pay

## 2020-08-18 NOTE — Telephone Encounter (Signed)
I have submitted a PA request for Aimovig 70mg /mL on [place] [employer], Confirmation W

## 2020-08-23 NOTE — Telephone Encounter (Signed)
Approved  Effective 08/18/20-08/14/21

## 2020-08-31 ENCOUNTER — Ambulatory Visit: Payer: Medicaid Other | Admitting: Registered"

## 2020-09-07 ENCOUNTER — Other Ambulatory Visit: Payer: Self-pay

## 2020-09-07 ENCOUNTER — Ambulatory Visit (INDEPENDENT_AMBULATORY_CARE_PROVIDER_SITE_OTHER): Payer: Medicaid Other | Admitting: Family Medicine

## 2020-09-07 ENCOUNTER — Encounter: Payer: Self-pay | Admitting: Family Medicine

## 2020-09-07 DIAGNOSIS — M84364D Stress fracture, left fibula, subsequent encounter for fracture with routine healing: Secondary | ICD-10-CM | POA: Diagnosis not present

## 2020-09-07 NOTE — Patient Instructions (Signed)
Good to see you Please use ice as needed  You can stop the boot  Please continue the exercises   Please send me a message in MyChart with any questions or updates.  Please see me back in 4 weeks or as needed if better.   --Dr. Jordan Likes

## 2020-09-07 NOTE — Progress Notes (Signed)
[name] - 30 y.o. female MRN [id number]  Date of birth: 15-Apr-1991  SUBJECTIVE:  Including CC & ROS.  No chief complaint on file.   [name] is a 30 y.o. female that is following up for her left knee pain.  Has been doing well in the cam walker.  Denies any pain on the standing.   Review of Systems See HPI   HISTORY: Past Medical, Surgical, Social, and Family History Reviewed & Updated per EMR.   Pertinent Historical Findings include:  Past Medical History:  Diagnosis Date  . Anemia   . Anxiety   . Arthritis   . Bilateral hearing loss   . Bladder mass   . Chronic interstitial cystitis   . Chronic interstitial cystitis with hematuria    2016  . Chronic sinusitis   . Fibromyalgia   . GERD (gastroesophageal reflux disease)   . Gross hematuria   . History of Graves' disease    tx done in 2007  . History of migraine   . History of recurrent UTIs   . Hypothyroidism   . Iron deficiency anemia    due to SGS  . Lower urinary tract symptoms (LUTS)   . Lupus [facility])    rheumologist--  dr Janalyn Rouse devashwer  . Malabsorption syndrome   . Migraine   . Migraine   . Passage of loose stools    chronic --  due to short gut syndrome  . Pleuritis   . S/P radioactive iodine thyroid ablation    2007  . SGS (short gut syndrome)   . Short gut syndrome   . Sjogren's disease (HCC)   . Vitiligo   . Wears glasses     Past Surgical History:  Procedure Laterality Date  . BOWEL RESECTION  newborn   necrotizing enterocolitis (liver patched)  . BRONCHOSCOPY  2014  . CESAREAN SECTION  01/02/2012   Procedure: CESAREAN SECTION;  Surgeon: Lenoard Aden, MD;  Location: [facility];  Service: Gynecology;  Laterality: N/A;  . COLONOSCOPY  07-29-2013  . CYSTECTOMY W/ URETEROILEAL CONDUIT  10/2017  . CYSTOSCOPY WITH BIOPSY N/A 11/23/2014   Procedure: CYSTOSCOPY WITH BLADDER  BIOPSY AND FULGERATION;  Surgeon: Bjorn Pippin, MD;  Location[facility];  Service: Urology;   Laterality: N/A;  . KNEE ARTHROSCOPY Right 2008  . LIVER SURGERY    . MULTIPLE EXTRACTIONS WITH ALVEOLOPLASTY N/A 03/24/2018   Procedure: EXTRACTIONS X 9;  Surgeon: Ocie Doyne, DDS;  Location: [facility];  Service: Oral Surgery;  Laterality: N/A;  . SMALL INTESTINE SURGERY    . WISDOM TOOTH EXTRACTION      Family History  Problem Relation Age of Onset  . Diabetes Father   . Endometriosis Mother   . Fibromyalgia Mother   . Heart disease Maternal Grandmother   . Hypertension Maternal Grandmother   . Osteoporosis Maternal Grandmother   . Heart disease Maternal Grandfather   . Hypertension Maternal Grandfather   . Prostate cancer Maternal Grandfather   . Heart disease Paternal Grandmother   . Other Neg Hx   . Colon cancer Neg Hx   . Pancreatic cancer Neg Hx   . Stomach cancer Neg Hx     Social History   Socioeconomic History  . Marital status: Divorced    Spouse name: Not on file  . Number of children: 1  . Years of education: college  . Highest education level: Not on file  Occupational History  . Occupation: N/A  Tobacco Use  . Smoking status: Never Smoker  . Smokeless tobacco: Never Used  Substance and Sexual Activity  . Alcohol use: No    Alcohol/week: 0.0 standard drinks  . Drug use: No  . Sexual activity: Not on file  Other Topics Concern  . Not on file  Social History Narrative   Lives at home with family.   Right-handed.   6 cups tea throughout the week.   Social Determinants of Health   Financial Resource Strain: Not on file  Food Insecurity: Not on file  Transportation Needs: Not on file  Physical Activity: Not on file  Stress: Not on file  Social Connections: Not on file  Intimate Partner Violence: Not on file     PHYSICAL EXAM:  VS: BP (!) 86/60 (BP Location: Left Arm, Patient Position: Sitting, Cuff Size: Normal)   Ht 5' (1.524 m)   Wt 100 lb (45.4 kg)   BMI 19.53 kg/m  Physical Exam Gen: NAD, alert, cooperative with  exam, well-appearing MSK:  Left leg: Only minor tenderness to palpation at the distal fibula. No swelling or ecchymosis. Normal strength resistance. Neurovascular intact     ASSESSMENT & PLAN:   Stress fracture of left fibula Has gotten significant improvement with the cam walker. -Counseled on home exercise therapy and supportive care. -Can discontinue the cam walker. -Could consider physical therapy.

## 2020-09-07 NOTE — Assessment & Plan Note (Signed)
Has gotten significant improvement with the cam walker. -Counseled on home exercise therapy and supportive care. -Can discontinue the cam walker. -Could consider physical therapy.

## 2020-09-14 ENCOUNTER — Encounter: Payer: Medicaid Other | Attending: Physician Assistant | Admitting: Registered"

## 2020-09-14 ENCOUNTER — Other Ambulatory Visit: Payer: Self-pay

## 2020-09-14 ENCOUNTER — Encounter: Payer: Self-pay | Admitting: Registered"

## 2020-09-14 DIAGNOSIS — Z713 Dietary counseling and surveillance: Secondary | ICD-10-CM | POA: Insufficient documentation

## 2020-09-14 NOTE — Progress Notes (Signed)
Medical Nutrition Therapy:  Appt start time: 2:03 end time: 2:55  Patient was seen on 09/14/2020 for nutrition counseling pertaining to disordered eating  Primary care provider: Rueben Bash, PA Therapist: Mathis Dad (sees weekly)  ROI: 10/14/2018 Any other medical team members: none    Assessment:    States she has had change in medications recently. States she has had overall increase in appetite and times of increased food aversions due to smell. States there are times of increased fatigue as we. Reports she is needing naps during the day but unable to stay asleep at night. States her daughter sleeps with her at times along with dog and makes it difficult for her to stay asleep for longer than 4-5 hours. States sometimes she will cook [place] bacon, eggs, grits, and toast for breakfast.   States it bothers her when people constantly make comments about how much she is eating, not eating, and her weight changes.   Pt expectations: something she can do to help gain more weight  Previous appts: Reports she hit a milestone yesterday and no longer caring about her weight. States she has not been active lately due to not feeling well as result of lupus flare ups and fibromyalgia. States she has started taking MCT, take 3 every full meal. Reports increased intake, improved dizziness, and improvements with chest pain/heart racing. Pt states she lives with parents and 30 year old daughter. Pt states she was taking MVI and supplements prior to last GI procedure and still vitamin deficient. Was deficient prior to procedures due to absorption and diarrhea. Due to large part of intestines being removed. Pt reports being deficient in B vitamins, fat-soluble vitamins, zinc, and selenium.   Pt reports having feeding tube from infancy to [age] years old. Pt reports restricting food since 30 years old; no spicy, no dairy, no greasy foods because it would cause her to vomit.   Pt states she has had over  half intestines removed plus more to help with bladder. Pt states bladder pain has improved but she still has some stomach issues.   Pt reports being able to tolerate well: soup, rice, baked chicken, seafood, not a big fan of meat, fruit. Pt states she also does not tolerate dairy or watermelon well.   Recent weight: 97.8 102 (pt reported from home), +1.7 lbs from 100.3 5 months ago (10/06/2019); our office scale is broken today  Medical Information:  Changes in hair, skin, nails since ED started: drier skin, some hair loss (believes it is due to medication changes) Chewing/swallowing difficulties: no Relux or heartburn: sometimes: not more than usual, takes reflux medications  Trouble with teeth: no LMP without the use of hormones: 3/14  Weight at that point: N/A Constipation, diarrhea: no constipation; increased diarrhea Dizziness/lightheadedness: no, only once last week Headaches/body aches: on and off Heart racing/chest pain: no Mood: better Sleep: about 4+ hrs/night Focus/concentration: has trouble remembering things Cold intolerance: yes Vision changes: no  Mental health diagnosis:   Dietary assessment: A typical day consists of 2-3 meals and 2 snacks  Safe foods include: herbal tea, sweet tea, fruit smoothies, fruit, yogurt, rice, shrimp Avoided foods include: red meat, fish, bread, and cashews  24 hour recall:  B (6:15 AM): fruit + smoothie (fruit only) + egg + grits Snk (8 AM): handful of pretzels L (2 PM): chef salad (vegetables, [place], chicken) + balsamic vinaigrette dressing Snk (4 PM):  D (6:30 PM): 1/2 plate rice + 3 oz grilled chicken + 4  Tbs gravy + 1/4 plate greens  S ( PM): handful of pretzels + 1/2 pineapple  Beverages: smoothie, water (6*16 oz; 96 oz); 96+ oz  Usual physical activity: none reported; walks dog daily  What Methods Do You Use To Control Your Weight (Compensatory behaviors)?           Restricting (calories, fat, carbs)  Food rules or  rituals-eats to gain weight, weighs about  6 times/day to see how weight fluctuates   Estimated energy intake: 1300-1400 kcals  Estimated energy needs: 2000-2200 calories 225-248g carbohydrates 150-165 g protein 56-61 g fat  Nutritional Diagnosis:  NB-1.5 Disordered eating pattern As related to skipping meals.  As evidenced by dietary recall.    Intervention:  Nutrition education and counseling. Encouraged pt with increased intake and having more balanced meals. Encouraged continuing to nourish body and eat food as tolerated. Encouraged pt with increased water intake.   Teaching Method Utilized:  Visual Auditory Hands on  Handouts given during visit include:  none  Barriers to learning/adherence to lifestyle change: none identified  Demonstrated degree of understanding via:  Teach Back   Monitoring/Evaluation:  Dietary intake, exercise, and body weight in 4 week(s).

## 2020-09-21 ENCOUNTER — Telehealth: Payer: Self-pay | Admitting: Gastroenterology

## 2020-09-21 NOTE — Telephone Encounter (Signed)
Patient called states she has been having abdominal pain and bloating for about 3 weeks now. Is not having any issues going to the bathroom or eating just has this consistent pain that does not go away and doesn't know where its coming from seeking advise.

## 2020-09-21 NOTE — Telephone Encounter (Signed)
The pt has been having bloating and abd discomfort for a few weeks.  Last appt 2 years ago. She is taking PPI at bedtime. She has been scheduled for a follow up appt with Dr Christella Hartigan on 5/13.  She will call back if she has any concerns prior to the appt.

## 2020-09-23 ENCOUNTER — Emergency Department (HOSPITAL_COMMUNITY)
Admission: EM | Admit: 2020-09-23 | Discharge: 2020-09-23 | Disposition: A | Discharge status: Home / Self Care | Payer: Medicaid Other | Attending: Emergency Medicine | Admitting: Emergency Medicine

## 2020-09-23 ENCOUNTER — Encounter (HOSPITAL_COMMUNITY): Payer: Self-pay

## 2020-09-23 ENCOUNTER — Emergency Department (HOSPITAL_COMMUNITY): Payer: Medicaid Other

## 2020-09-23 ENCOUNTER — Other Ambulatory Visit: Payer: Self-pay

## 2020-09-23 DIAGNOSIS — R0602 Shortness of breath: Secondary | ICD-10-CM | POA: Diagnosis not present

## 2020-09-23 DIAGNOSIS — E039 Hypothyroidism, unspecified: Secondary | ICD-10-CM | POA: Insufficient documentation

## 2020-09-23 DIAGNOSIS — Z79899 Other long term (current) drug therapy: Secondary | ICD-10-CM | POA: Insufficient documentation

## 2020-09-23 DIAGNOSIS — R079 Chest pain, unspecified: Secondary | ICD-10-CM | POA: Diagnosis not present

## 2020-09-23 DIAGNOSIS — M549 Dorsalgia, unspecified: Secondary | ICD-10-CM | POA: Insufficient documentation

## 2020-09-23 LAB — BASIC METABOLIC PANEL
Anion gap: 6 (ref 5–15)
BUN: 11 mg/dL (ref 6–20)
CO2: 22 mmol/L (ref 22–32)
Calcium: 9 mg/dL (ref 8.9–10.3)
Chloride: 108 mmol/L (ref 98–111)
Creatinine, Ser: 0.74 mg/dL (ref 0.44–1.00)
GFR, Estimated: >60 mL/min (ref >60)
Glucose, Bld: 82 mg/dL (ref 70–99)
Potassium: 4.6 mmol/L (ref 3.5–5.1)
Sodium: 136 mmol/L (ref 135–145)

## 2020-09-23 LAB — TROPONIN I (HIGH SENSITIVITY): Troponin I (High Sensitivity): <2 ng/L (ref <18)

## 2020-09-23 LAB — CBC
HCT: 39.5 % (ref 36.0–46.0)
Hemoglobin: 12.8 g/dL (ref 12.0–15.0)
MCH: 29.9 pg (ref 26.0–34.0)
MCHC: 32.4 g/dL (ref 30.0–36.0)
MCV: 92.3 fL (ref 80.0–100.0)
Platelets: 180 10*3/uL (ref 150–400)
RBC: 4.28 MIL/uL (ref 3.87–5.11)
RDW: 14.4 % (ref 11.5–15.5)
WBC: 4.1 10*3/uL (ref 4.0–10.5)
nRBC: 0 % (ref 0.0–0.2)

## 2020-09-23 LAB — D-DIMER, QUANTITATIVE: D-Dimer, Quant: 0.41 ug/mL-FEU (ref 0.00–0.50)

## 2020-09-23 LAB — I-STAT BETA HCG BLOOD, ED (MC, WL, AP ONLY): I-stat hCG, quantitative: <5 m[IU]/mL (ref <5)

## 2020-09-23 MED ORDER — CYCLOBENZAPRINE HCL 10 MG PO TABS: 10.0000 mg | ORAL_TABLET | Freq: Every day | ORAL | 0 refills | Status: AC

## 2020-09-23 NOTE — ED Provider Notes (Signed)
[facility]-EMERGENCY DEPT Provider Note   CSN: [id number] Arrival date & time: 09/23/20  [id number]     History Chief Complaint  Patient presents with  . Chest Pain    [name] is a 30 y.o. female.  HPI Patient is a 30 year old female with a complicated medical history as noted below.  She presents the emergency department today due to chest pain.  She states it started about 2 weeks ago and has been intermittent.  It radiates from her chest to her mid back.  She states her current pain is about 8/10.  Reports mild shortness of breath when lying flat.  Also states that her pain worsens with deep breathing.  She states it is sharp in her chest and aching in her back.  No modifying factors.  She is not anticoagulated and denies any history of blood clots.  Denies any fevers, chills, diaphoresis, vomiting, abdominal pain.    Past Medical History:  Diagnosis Date  . Anemia   . Anxiety   . Arthritis   . Bilateral hearing loss   . Bladder mass   . Chronic interstitial cystitis   . Chronic interstitial cystitis with hematuria    2016  . Chronic sinusitis   . Fibromyalgia   . GERD (gastroesophageal reflux disease)   . Gross hematuria   . History of Graves' disease    tx done in 2007  . History of migraine   . History of recurrent UTIs   . Hypothyroidism   . Iron deficiency anemia    due to SGS  . Lower urinary tract symptoms (LUTS)   . Lupus [facility])    rheumologist--  dr Janalyn Rouse devashwer  . Malabsorption syndrome   . Migraine   . Migraine   . Passage of loose stools    chronic --  due to short gut syndrome  . Pleuritis   . S/P radioactive iodine thyroid ablation    2007  . SGS (short gut syndrome)   . Short gut syndrome   . Sjogren's disease (HCC)   . Vitiligo   . Wears glasses     Patient Active Problem List   Diagnosis Date Noted  . Stress fracture of left fibula 08/17/2020  . Abdominal pain 04/14/2020  . Hypokalemia 04/14/2020  .  Hypomagnesemia 04/14/2020  . Acute lower UTI 04/14/2020  . SBO (small bowel obstruction) (HCC) 04/14/2020  . Small bowel obstruction (HCC)   . Paresthesia 04/07/2019  . Chronic migraine 04/07/2019  . Hypersensitivity reaction 01/13/2016  . Iron deficiency anemia due to chronic blood loss 01/05/2016  . Chronic interstitial cystitis with hematuria   . H/O Graves' disease 07/22/2013  . Sjogren's disease (HCC) 07/22/2013  . Malabsorption syndrome 07/22/2013  . Migraine with aura 07/22/2013  . Pelvic adhesive disease 07/22/2013  . Hypothyroidism complicating pregnancy / delivered 12/28/2011    Past Surgical History:  Procedure Laterality Date  . BOWEL RESECTION  newborn   necrotizing enterocolitis (liver patched)  . BRONCHOSCOPY  2014  . CESAREAN SECTION  01/02/2012   Procedure: CESAREAN SECTION;  Surgeon: Lenoard Aden, MD;  Location: [facility];  Service: Gynecology;  Laterality: N/A;  . COLONOSCOPY  07-29-2013  . CYSTECTOMY W/ URETEROILEAL CONDUIT  10/2017  . CYSTOSCOPY WITH BIOPSY N/A 11/23/2014   Procedure: CYSTOSCOPY WITH BLADDER  BIOPSY AND FULGERATION;  Surgeon: Bjorn Pippin, MD;  Location: [facility];  Service: Urology;  Laterality: N/A;  . KNEE ARTHROSCOPY Right 2008  . LIVER SURGERY    .  MULTIPLE EXTRACTIONS WITH ALVEOLOPLASTY N/A 03/24/2018   Procedure: EXTRACTIONS X 9;  Surgeon: Ocie Doyne, DDS;  Location: [facility];  Service: Oral Surgery;  Laterality: N/A;  . SMALL INTESTINE SURGERY    . WISDOM TOOTH EXTRACTION       OB History    Gravida  3   Para  1   Term  1   Preterm  0   AB  1   Living  1     SAB  0   IAB  0   Ectopic  1   Multiple  0   Live Births  1           Family History  Problem Relation Age of Onset  . Diabetes Father   . Endometriosis Mother   . Fibromyalgia Mother   . Heart disease Maternal Grandmother   . Hypertension Maternal Grandmother   . Osteoporosis Maternal Grandmother   . Heart  disease Maternal Grandfather   . Hypertension Maternal Grandfather   . Prostate cancer Maternal Grandfather   . Heart disease Paternal Grandmother   . Other Neg Hx   . Colon cancer Neg Hx   . Pancreatic cancer Neg Hx   . Stomach cancer Neg Hx     Social History   Tobacco Use  . Smoking status: Never Smoker  . Smokeless tobacco: Never Used  Substance Use Topics  . Alcohol use: No    Alcohol/week: 0.0 standard drinks  . Drug use: No    Home Medications Prior to Admission medications   Medication Sig Start Date End Date Taking? Authorizing Provider  cyclobenzaprine (FLEXERIL) 10 MG tablet Take 1 tablet (10 mg total) by mouth at bedtime for 7 days. 09/23/20 09/30/20 Yes Placido Sou, PA-C  AMBULATORY NON FORMULARY MEDICATION Iron Infusions every 3 months done at [facility]- ordered thru [facility]    [provider]  cetirizine (ZYRTEC) 5 MG tablet Take 5 mg by mouth daily.    [provider]  diphenoxylate-atropine (LOMOTIL) 2.5-0.025 MG tablet One tablet twice a day 03/08/17   Meredith Pel, NP  eletriptan (RELPAX) 40 MG tablet Take 1 tablet (40 mg total) by mouth as needed for migraine or headache. May repeat in 2 hours if headache persists or recurs. 08/11/20   Glean Salvo, NP  EPINEPHrine 0.3 mg/0.3 mL IJ SOAJ injection Inject 0.3 mg into the muscle as needed for anaphylaxis.  03/31/19   [provider]  Erenumab-aooe (AIMOVIG) 70 MG/ML SOAJ Inject 70 mg into the skin at bedtime. 08/11/20   Glean Salvo, NP  hydroxychloroquine (PLAQUENIL) 200 MG tablet Take 400 mg by mouth every morning.     [provider]  hydroxypropyl methylcellulose / hypromellose (ISOPTO TEARS / GONIOVISC) 2.5 % ophthalmic solution Place 2 drops into both eyes 3 (three) times daily as needed for dry eyes.  04/27/19   [provider]  levothyroxine (SYNTHROID) 100 MCG tablet Take 112 mcg by mouth daily. 02/27/17   [provider]  omalizumab  Geoffry Paradise) 150 MG/ML prefilled syringe Inject 150 mg into the skin every 28 (twenty-eight) days.     [provider]  OMEPRAZOLE PO Take 20 mg by mouth at bedtime.     [provider]  ondansetron (ZOFRAN-ODT) 8 MG disintegrating tablet Take 1 tablet (8 mg total) by mouth every 8 (eight) hours as needed. 08/11/20   Glean Salvo, NP  polyethylene glycol (MIRALAX / GLYCOLAX) 17 g packet Take  17 g by mouth daily. Hold if you develop diarrhea and then use as needed. 04/19/20   Rodolph Bonghompson, Daniel V, MD  predniSONE (DELTASONE) 5 MG tablet Take 1 tablet by mouth daily. 04/27/19   [provider]  propranolol ER (INDERAL LA) 80 MG 24 hr capsule Take 1 capsule (80 mg total) by mouth at bedtime. 08/11/20   Glean SalvoSlack, Sarah J, NP  QUEtiapine (SEROQUEL) 300 MG tablet Take 300 mg by mouth at bedtime.    [provider]  SUMAtriptan (IMITREX) 6 MG/0.5ML SOLN injection Inject 0.5 mLs (6 mg total) into the skin every 2 (two) hours as needed for migraine or headache. May repeat in 2 hours if headache persists or recurs. 08/11/20   Glean SalvoSlack, Sarah J, NP  tiZANidine (ZANAFLEX) 4 MG tablet Take 1 tablet (4 mg total) by mouth every 8 (eight) hours as needed for muscle spasms. 08/11/20   Glean SalvoSlack, Sarah J, NP  White Petrolatum-Mineral Oil [facility] PETROL-MINERAL OIL-LANOLIN) 0.1-0.1 % OINT Place 1 application into both eyes at bedtime. 04/28/19   [provider]    Allergies    Penicillins, Buprenorphine hcl, Morphine and related, and Hydrocodone-acetaminophen  Review of Systems   Review of Systems  All other systems reviewed and are negative. Ten systems reviewed and are negative for acute change, except as noted in the HPI.   Physical Exam Updated Vital Signs BP 97/84   Pulse 67   Temp 98.7 F (37.1 C) (Oral)   Resp 15   Ht 5\' 3"  (1.6 m)   Wt 43.5 kg   LMP 08/26/2020   SpO2 100%   BMI 17.01 kg/m   Physical Exam Vitals and nursing note reviewed.  Constitutional:      General:  She is not in acute distress.    Appearance: Normal appearance. She is well-developed and normal weight. She is not ill-appearing, toxic-appearing or diaphoretic.  HENT:     Head: Normocephalic and atraumatic.     Right Ear: External ear normal.     Left Ear: External ear normal.     Nose: Nose normal.     Mouth/Throat:     Mouth: Mucous membranes are moist.     Pharynx: Oropharynx is clear. No oropharyngeal exudate or posterior oropharyngeal erythema.  Eyes:     Extraocular Movements: Extraocular movements intact.  Cardiovascular:     Rate and Rhythm: Normal rate and regular rhythm.     Pulses: Normal pulses.          Radial pulses are 2+ on the right side and 2+ on the left side.       Dorsalis pedis pulses are 2+ on the right side and 2+ on the left side.     Heart sounds: Normal heart sounds. Heart sounds not distant. No murmur heard.  No systolic murmur is present.  No diastolic murmur is present. No friction rub. No gallop. No S3 or S4 sounds.   Pulmonary:     Effort: Pulmonary effort is normal. No tachypnea, accessory muscle usage or respiratory distress.     Breath sounds: Normal breath sounds. No stridor. No decreased breath sounds, wheezing, rhonchi or rales.  Chest:     Chest wall: No tenderness.     Comments: No anterior chest wall tenderness.  Abdominal:     General: Abdomen is flat.     Palpations: Abdomen is soft.     Tenderness: There is no abdominal tenderness.  Musculoskeletal:        General: Normal range  of motion.     Cervical back: Normal range of motion and neck supple. No tenderness.     Right lower leg: No tenderness. No edema.     Left lower leg: No tenderness. No edema.     Comments: No leg swelling.  Skin:    General: Skin is warm and dry.  Neurological:     General: No focal deficit present.     Mental Status: She is alert and oriented to person, place, and time.  Psychiatric:        Mood and Affect: Mood normal.        Behavior: Behavior  normal.    ED Results / Procedures / Treatments   Labs (all labs ordered are listed, but only abnormal results are displayed) Labs Reviewed  BASIC METABOLIC PANEL  CBC  D-DIMER, QUANTITATIVE  I-STAT BETA HCG BLOOD, ED ([facility], [facility], [facility] ONLY)  TROPONIN I (HIGH SENSITIVITY)  TROPONIN I (HIGH SENSITIVITY)   EKG None  Radiology DG Chest 2 View  Result Date: 09/23/2020 CLINICAL DATA:  Chest pain radiating to the back.  Tachycardia. EXAM: CHEST - 2 VIEW COMPARISON:  04/13/2020 FINDINGS: The heart size and mediastinal contours are within normal limits. Both lungs are clear. The visualized skeletal structures are unremarkable. IMPRESSION: No active cardiopulmonary disease. Electronically Signed   By: Paulina Fusi M.D.   On: 09/23/2020 10:17    Procedures Procedures   Medications Ordered in ED Medications - No data to display  ED Course  I have reviewed the triage vital signs and the nursing notes.  Pertinent labs & imaging results that were available during my care of the patient were reviewed by me and considered in my medical decision making (see chart for details).    MDM Rules/Calculators/A&P                          Pt is a 30 y.o. female who presents the emergency department with chest pain.  Labs: CBC without abnormalities. BMP without abnormalities. Troponin less than 2. I-STAT beta-hCG less than 5. D-dimer not elevated at 0.41.  Imaging: Chest x-ray is negative.  ECG: Normal sinus rhythm.   I, Placido Sou, PA-C, personally reviewed and evaluated these images and lab results as part of my medical decision-making.  Unsure of the cause of the patient's symptoms.  Her lab work today is all extremely reassuring.  Troponin is less than 2.  ECG showing NSR.  D-dimer is not elevated at 0.41.  Doubt ACS.  Doubt DVT/PE.  No white count on CBC.  She is afebrile and not tachycardic.  Doubt infectious process.  Symptoms are likely musculoskeletal in nature.  Patient has  taken Flexeril in the past for similar pain and notes relief.  Will discharge on a course of Flexeril.  We discussed safety regarding this medication.  Discussed return precautions.  Recommended that she follow-up with her PCP as well.  Her questions were answered and she was amicable at the time of discharge.  Note: Portions of this report may have been transcribed using voice recognition software. Every effort was made to ensure accuracy; however, inadvertent computerized transcription errors may be present.   Final Clinical Impression(s) / ED Diagnoses Final diagnoses:  Chest pain, unspecified type   Rx / DC Orders ED Discharge Orders         Ordered    cyclobenzaprine (FLEXERIL) 10 MG tablet  Daily at bedtime  09/23/20 1133           Placido Sou, PA-C 09/23/20 1135    Bethann Berkshire, MD 09/24/20 0730

## 2020-09-23 NOTE — Discharge Instructions (Signed)
I am prescribing you a strong muscle relaxer called flexeril. Please only take this medication once in the evening with dinner. This medication can make you quite drowsy. Do not mix it with alcohol. Do not drive a vehicle after taking it.   Please follow-up with your regular doctor.  If your symptoms worsen, please return to the emergency department for reevaluation.  It was a pleasure to meet you.

## 2020-09-23 NOTE — ED Triage Notes (Signed)
Reports midsternal chest pressure since last week but got worse on Sunday that radiates to the back. Took tylenol with no relief. 8/10 pain. Reports difficulty breathing when lying down with the chest pain.

## 2020-10-26 ENCOUNTER — Ambulatory Visit: Payer: Medicaid Other | Admitting: Registered"

## 2020-11-11 ENCOUNTER — Ambulatory Visit: Payer: Medicaid Other | Admitting: Gastroenterology

## 2020-11-30 ENCOUNTER — Ambulatory Visit (INDEPENDENT_AMBULATORY_CARE_PROVIDER_SITE_OTHER): Payer: Medicaid Other | Admitting: Gastroenterology

## 2020-11-30 ENCOUNTER — Encounter: Payer: Self-pay | Admitting: Gastroenterology

## 2020-11-30 VITALS — BP 100/60 | HR 96 | Ht 60.25 in | Wt 100.1 lb

## 2020-11-30 DIAGNOSIS — R1013 Epigastric pain: Secondary | ICD-10-CM | POA: Diagnosis not present

## 2020-11-30 DIAGNOSIS — K8050 Calculus of bile duct without cholangitis or cholecystitis without obstruction: Secondary | ICD-10-CM

## 2020-11-30 DIAGNOSIS — Z8719 Personal history of other diseases of the digestive system: Secondary | ICD-10-CM

## 2020-11-30 NOTE — Progress Notes (Signed)
Review of pertinent gastrointestinal problems: 1. 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  Testing January 2019 as work-up for cystectomy for "end-stage interstitial cystitis" showed that she was doing fairly well nutritionally.  DEXA scan, small bowel follow-through, vitamin levels all appear normal I did recommend she continue multi vitamin daily including zinc, calcium, vitamin D and over-the-counter iron supplement every single day. 2. Constipation: large amount of stool in dilated colon 03/2013; Colonoscopy 07/2013: tortuous colon, IC anastomosis was normal. Cholestyramine trial 2015 helped. 3. Diarrhea workup 2017: stool path panel negative. 4.  Cholelithiasis. 5.  Small bowel obstruction October 2021: Felt likely adhesive related.  Spent 5 days in the hospital and eventually improved  HPI: This is a very pleasant 30 year old woman   I last saw her about a year and a half ago, November 2020 for epigastric pains, nausea and rarely vomiting.  I recommended EGD at her soonest convenience and that she take her omeprazole shortly before breakfast meal and start a Pepcid 20 mg at bedtime every night.  EGD November 2020 showed very mild gastritis.  Biopsies were taken from her stomach and were negative for H. Pylori.  I recommended that she visit with a general surgeon to consider possibility that her gallstones were causing some of her epigastric pains.   Blood work May 2022 CBC was normal except for white blood cell count 4.1, complete metabolic profile was normal except for total bilirubin of 2.0  CT scan abdomen pelvis with IV contrast October 2021 indication "abdominal pain" findings "dilated loops of small bowel may represent ileus but concerning for small bowel obstruction with possible transition in the right lower quadrant."    Her weight is up 4 pounds since her last office  visit here 9 months ago.  Same scale.  She continues to have intermittent epigastric pains.  They occur daily, they can last for minutes to hours.  They sometimes are associated with eating.  They can definitely make her nauseous and periodically vomit.  She met with a surgeon 2 or 3 years ago to consider cholecystectomy.  I am not able to view any of the reports but she tells me "they said her stones were small and probably not causing any issues"  ROS: complete GI ROS as described in HPI, all other review negative.  Constitutional:  No unintentional weight loss   Past Medical History:  Diagnosis Date  . Anemia   . Anxiety   . Arthritis   . Bilateral hearing loss   . Bladder mass   . Chronic interstitial cystitis   . Chronic interstitial cystitis with hematuria    2016  . Chronic sinusitis   . Fibromyalgia   . GERD (gastroesophageal reflux disease)   . Gross hematuria   . History of Graves' disease    tx done in 2007  . History of migraine   . History of recurrent UTIs   . Hypothyroidism   . Iron deficiency anemia    due to SGS  . Lower urinary tract symptoms (LUTS)   . Lupus [facility])    rheumologist--  dr Abel Presto devashwer  . Malabsorption syndrome   . Migraine   . Migraine   . Passage of loose stools    chronic --  due to short gut syndrome  . Pleuritis   . S/P radioactive iodine thyroid ablation    2007  . SGS (short gut syndrome)   .  Short gut syndrome   . Sjogren's disease ([place])   . Vitiligo   . Wears glasses     Past Surgical History:  Procedure Laterality Date  . BOWEL RESECTION  newborn   necrotizing enterocolitis (liver patched)  . BRONCHOSCOPY  2014  . CESAREAN SECTION  01/02/2012   Procedure: CESAREAN SECTION;  Surgeon: Lovenia Kim, MD;  Location: [facility];  Service: Gynecology;  Laterality: N/A;  . COLONOSCOPY  07-29-2013  . CYSTECTOMY W/ URETEROILEAL CONDUIT  10/2017  . CYSTOSCOPY WITH BIOPSY N/A 11/23/2014   Procedure: CYSTOSCOPY WITH BLADDER   BIOPSY AND FULGERATION;  Surgeon: Irine Seal, MD;  Location: [facility];  Service: Urology;  Laterality: N/A;  . KNEE ARTHROSCOPY Right 2008  . LIVER SURGERY    . MULTIPLE EXTRACTIONS WITH ALVEOLOPLASTY N/A 03/24/2018   Procedure: EXTRACTIONS X 9;  Surgeon: Diona Browner, DDS;  Location: [place];  Service: Oral Surgery;  Laterality: N/A;  . SMALL INTESTINE SURGERY    . WISDOM TOOTH EXTRACTION      Current Outpatient Medications  Medication Sig Dispense Refill  . Adapalene 0.3 % gel Apply topically.    . AMBULATORY NON FORMULARY MEDICATION Iron Infusions every 3 months done at [facility]- ordered thru [facility]    . cetirizine (ZYRTEC) 5 MG tablet Take 5 mg by mouth daily.    . clindamycin (CLEOCIN T) 1 % external solution Apply a thin layer to the face/chest/back every morning. Do not rinse off.    . emtricitabine-tenofovir (TRUVADA) 200-300 MG tablet Take 1 tablet by mouth daily.    Eduard Roux (AIMOVIG) 70 MG/ML SOAJ Inject 70 mg into the skin at bedtime. 1 mL 11  . Fluocinolone Acetonide Body 0.01 % OIL Apply to scalp daily as needed for scalp lesions/itching.    . hydroxychloroquine (PLAQUENIL) 200 MG tablet Take 400 mg by mouth every morning.     . hydroxypropyl methylcellulose / hypromellose (ISOPTO TEARS / GONIOVISC) 2.5 % ophthalmic solution Place 2 drops into both eyes 3 (three) times daily as needed for dry eyes.     Marland Kitchen levothyroxine (SYNTHROID) 100 MCG tablet Take 112 mcg by mouth daily.    . mirtazapine (REMERON) 15 MG tablet Take 1 tablet by mouth daily.    Marland Kitchen omalizumab Arvid Right) 150 MG/ML prefilled syringe Inject 150 mg into the skin every 28 (twenty-eight) days.     . OMEPRAZOLE PO Take 20 mg by mouth at bedtime.     . ondansetron (ZOFRAN-ODT) 8 MG disintegrating tablet Take 1 tablet (8 mg total) by mouth every 8 (eight) hours as needed. 20 tablet 6  . pantoprazole (PROTONIX) 40 MG tablet Take 1 tablet by mouth in the morning and at  bedtime.    . prazosin (MINIPRESS) 5 MG capsule Take 1 capsule by mouth in the morning and at bedtime.    . predniSONE (DELTASONE) 5 MG tablet Take 1 tablet by mouth as needed.    . propranolol (INNOPRAN XL) 80 MG 24 hr capsule Take 1 capsule by mouth daily.    . propranolol ER (INDERAL LA) 80 MG 24 hr capsule Take 1 capsule (80 mg total) by mouth at bedtime. 30 capsule 11  . SUMAtriptan (IMITREX) 6 MG/0.5ML SOLN injection Inject 0.5 mLs (6 mg total) into the skin every 2 (two) hours as needed for migraine or headache. May repeat in 2 hours if headache persists or recurs. 6 mL 5  . tiZANidine (ZANAFLEX) 4 MG tablet Take 1  tablet (4 mg total) by mouth every 8 (eight) hours as needed for muscle spasms. 30 tablet 3  . traMADol (ULTRAM) 50 MG tablet Take 1 tablet by mouth every 6 (six) hours.    Marland Kitchen White Petrolatum-Mineral Oil ([place] PETROL-MINERAL OIL-LANOLIN) 0.1-0.1 % OINT Place 1 application into both eyes at bedtime.    Marland Kitchen EPINEPHrine 0.3 mg/0.3 mL IJ SOAJ injection Inject 0.3 mg into the muscle as needed for anaphylaxis.  (Patient not taking: Reported on 11/30/2020)     No current facility-administered medications for this visit.    Allergies as of 11/30/2020 - Review Complete 11/30/2020  Allergen Reaction Noted  . Penicillins Anaphylaxis 09/06/2011  . Buprenorphine hcl Itching 09/06/2011  . Morphine and related Itching 09/06/2011  . Hydrocodone-acetaminophen Rash 05/21/2017    Family History  Problem Relation Age of Onset  . Diabetes Father   . Endometriosis Mother   . Fibromyalgia Mother   . Heart disease Maternal Grandmother   . Hypertension Maternal Grandmother   . Osteoporosis Maternal Grandmother   . Heart disease Maternal Grandfather   . Hypertension Maternal Grandfather   . Prostate cancer Maternal Grandfather   . Heart disease Paternal Grandmother   . Other Neg Hx   . Colon cancer Neg Hx   . Pancreatic cancer Neg Hx   . Stomach cancer Neg Hx     Social History    Socioeconomic History  . Marital status: Divorced    Spouse name: Not on file  . Number of children: 1  . Years of education: college  . Highest education level: Not on file  Occupational History  . Occupation: N/A  Tobacco Use  . Smoking status: Never Smoker  . Smokeless tobacco: Never Used  Substance and Sexual Activity  . Alcohol use: No    Alcohol/week: 0.0 standard drinks  . Drug use: No  . Sexual activity: Not on file  Other Topics Concern  . Not on file  Social History Narrative   Lives at home with family.   Right-handed.   6 cups tea throughout the week.   Social Determinants of Health   Financial Resource Strain: Not on file  Food Insecurity: Not on file  Transportation Needs: Not on file  Physical Activity: Not on file  Stress: Not on file  Social Connections: Not on file  Intimate Partner Violence: Not on file     Physical Exam: Ht 5' 0.25" (1.53 m) Comment: height measured without shoes  Wt 100 lb 2 oz (45.4 kg)   LMP 11/24/2020   BMI 19.39 kg/m  Constitutional: generally well-appearing Psychiatric: alert and oriented x3 Abdomen: soft, nontender, nondistended, no obvious ascites, no peritoneal signs, normal bowel sounds No peripheral edema noted in lower extremities  Assessment and plan: 30 y.o. female with intermittent epigastric pains, known gallstones in the gallbladder  I explained to her very clearly that biliary colic can be sometimes quite hard to diagnose.  That is true for many patients.  In her it is even more difficult given her extensive abdominal surgical history, known significant adhesive disease.  I do however think it is possible that her gallstones are causing her pains.  She has ultrasound proven gallstones 2017 and 2020 and I Minna refer her back to general surgery to consider elective cholecystectomy.  Please see the "Patient Instructions" section for addition details about the plan.  Owens Loffler, MD [facility] 11/30/2020, 10:58 AM   Total time on date of encounter was 30 minutes (this included time  spent preparing to see the patient reviewing records; obtaining and/or reviewing separately obtained history; performing a medically appropriate exam and/or evaluation; counseling and educating the patient and family if present; ordering medications, tests or procedures if applicable; and documenting clinical information in the health record).

## 2020-11-30 NOTE — Patient Instructions (Signed)
If you are age 32 or younger, your body mass index should be between 19-25. Your Body mass index is 19.39 kg/m. If this is out of the aformentioned range listed, please consider follow up with your Primary Care Provider.   __________________________________________________________  The [facility] providers would like to encourage you to use [facility] to communicate with providers for non-urgent requests or questions.  Due to long hold times on the telephone, sending your provider a message by [facility] may be a faster and more efficient way to get a response.  Please allow 48 business hours for a response.  Please remember that this is for non-urgent requests.  ___________________________________________________________  Bonita Quin have been scheduled for an appointment with ________ at [facility]. Your appointment is on ______ at ___________. Please arrive at _______ for registration. Make certain to bring a list of current medications, including any over the counter medications or vitamins. Also bring your co-pay if you have one as well as your insurance cards. [facility] is located at [address], [address]. Should you need to reschedule your appointment, please contact them at [phone].  Thank you for entrusting me with your care and choosing [facility].  Dr Christella Hartigan

## 2020-12-01 ENCOUNTER — Telehealth: Payer: Self-pay | Admitting: Gastroenterology

## 2020-12-01 NOTE — Telephone Encounter (Signed)
Patient states that she received call from [facility] with her appointment that was scheduled in [place] with Dr Daphine Deutscher.  Patient preferred to be seen in [place] office by another physician for a second opinion.  Patient asked that I call [facility] to change the appointment location and provider.  I called [facility] and patient was rescheduled with Dr Rayburn Ma on 01-10-2021 at 3:50pm.  Patient advised to arrive at [facility] location at 3:35pm on day office appointment.  Patient agreed to plan and verbalized understanding.  No further questions.

## 2020-12-01 NOTE — Telephone Encounter (Signed)
Inbound call from patient requesting a call from a nurse please.  Has questions about the referral that was sent to [facility].

## 2020-12-29 IMAGING — RF DG NASO G TUBE PLC W/FL W/RAD
2 series · 2 of 2 positions shown · IV contrast (agent unspecified)
Comparison: 04/13/2020 CT abdomen/pelvis.

CLINICAL DATA: Gastric and small bowel dilatation and abdominal
pain. NG tube placement requested for suction.

EXAM:
NASO G TUBE PLACEMENT WITH FL AND WITH RAD
CONTRAST:  None.
FLUOROSCOPY TIME:  Fluoroscopy Time:  0 minutes 12 seconds
Radiation Exposure Index (if provided by the fluoroscopic device):
0.9 mGy
Number of Acquired Spot Images: 0

[Series 1: cp_standard · 0.19mm/px · 1 of 1 slices shown (1 of 2)]
[im 1/1]
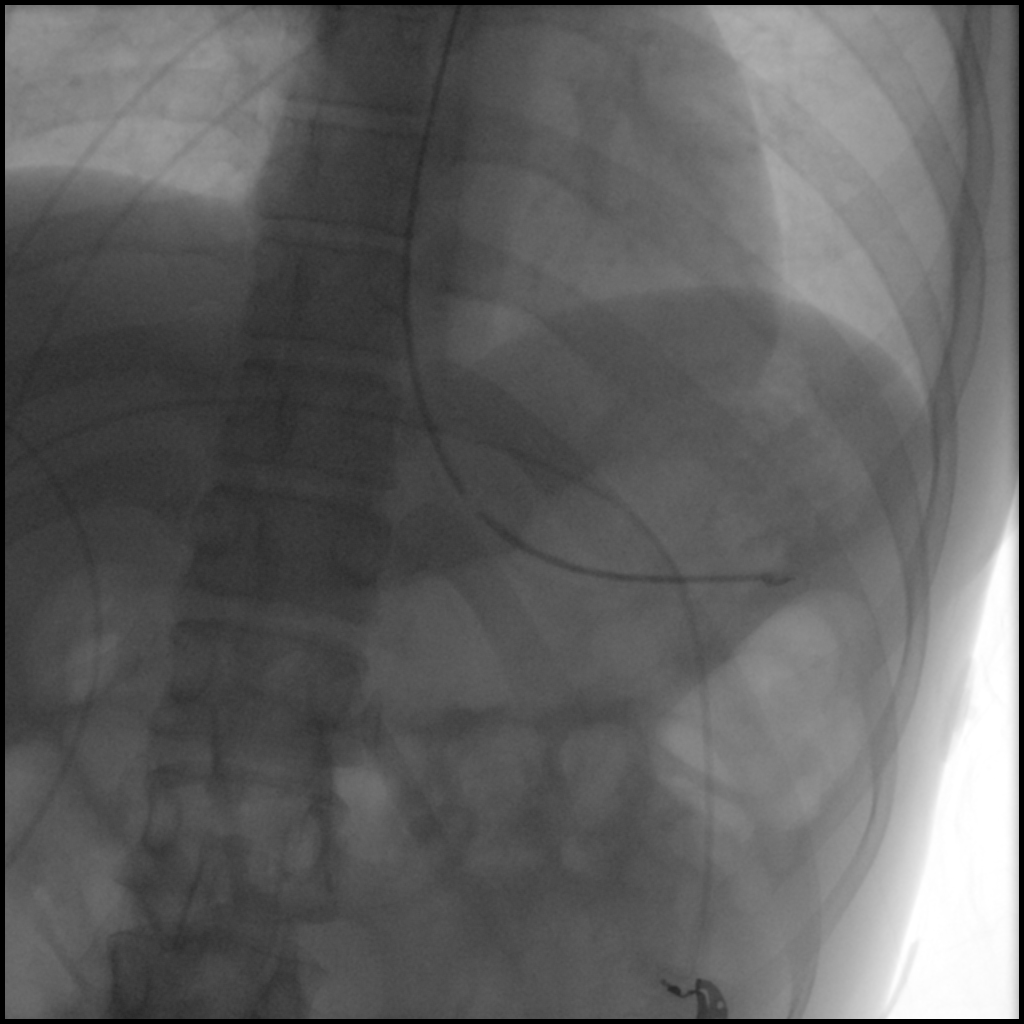

[Series 2: cp_standard · 0.19mm/px · 1 of 1 slices shown (2 of 2)]
[im 1/1]
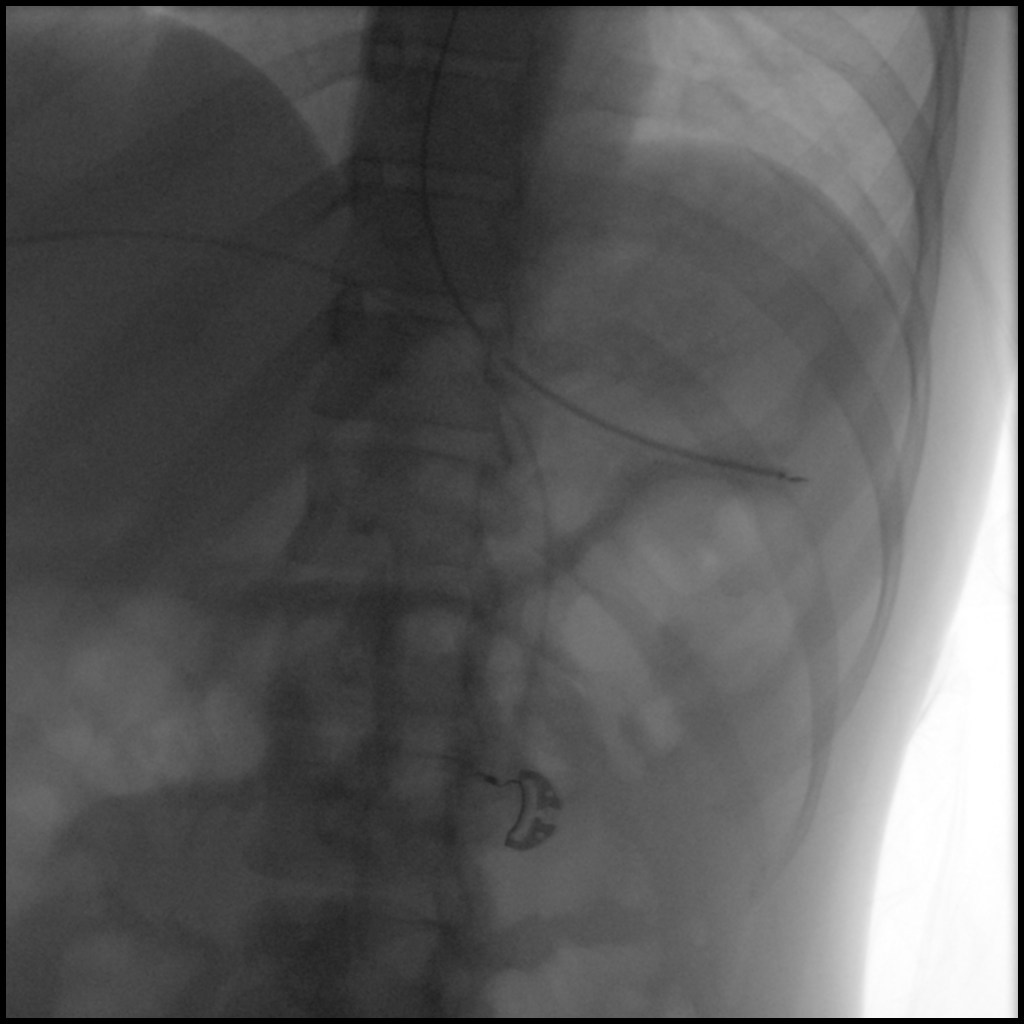

[2 of 2 positions shown; findings below may reference images not displayed]

FINDINGS: Nasogastric tube advanced without difficulty into the stomach with
tip in the gastric fundus.
IMPRESSION: Successful nasogastric tube placement under fluoroscopic guidance
with tip in the gastric fundus.

## 2020-12-30 IMAGING — DX DG ABD PORTABLE 1V
1 series · 1 of 1 positions shown · non-contrast
Comparison: CT abdomen pelvis 04/13/2020

CLINICAL DATA: Small-bowel obstruction image.  8 hour delay.

EXAM:
PORTABLE ABDOMEN - 1 VIEW. The upper abdomen is collimated off view.

[abdomen kub]
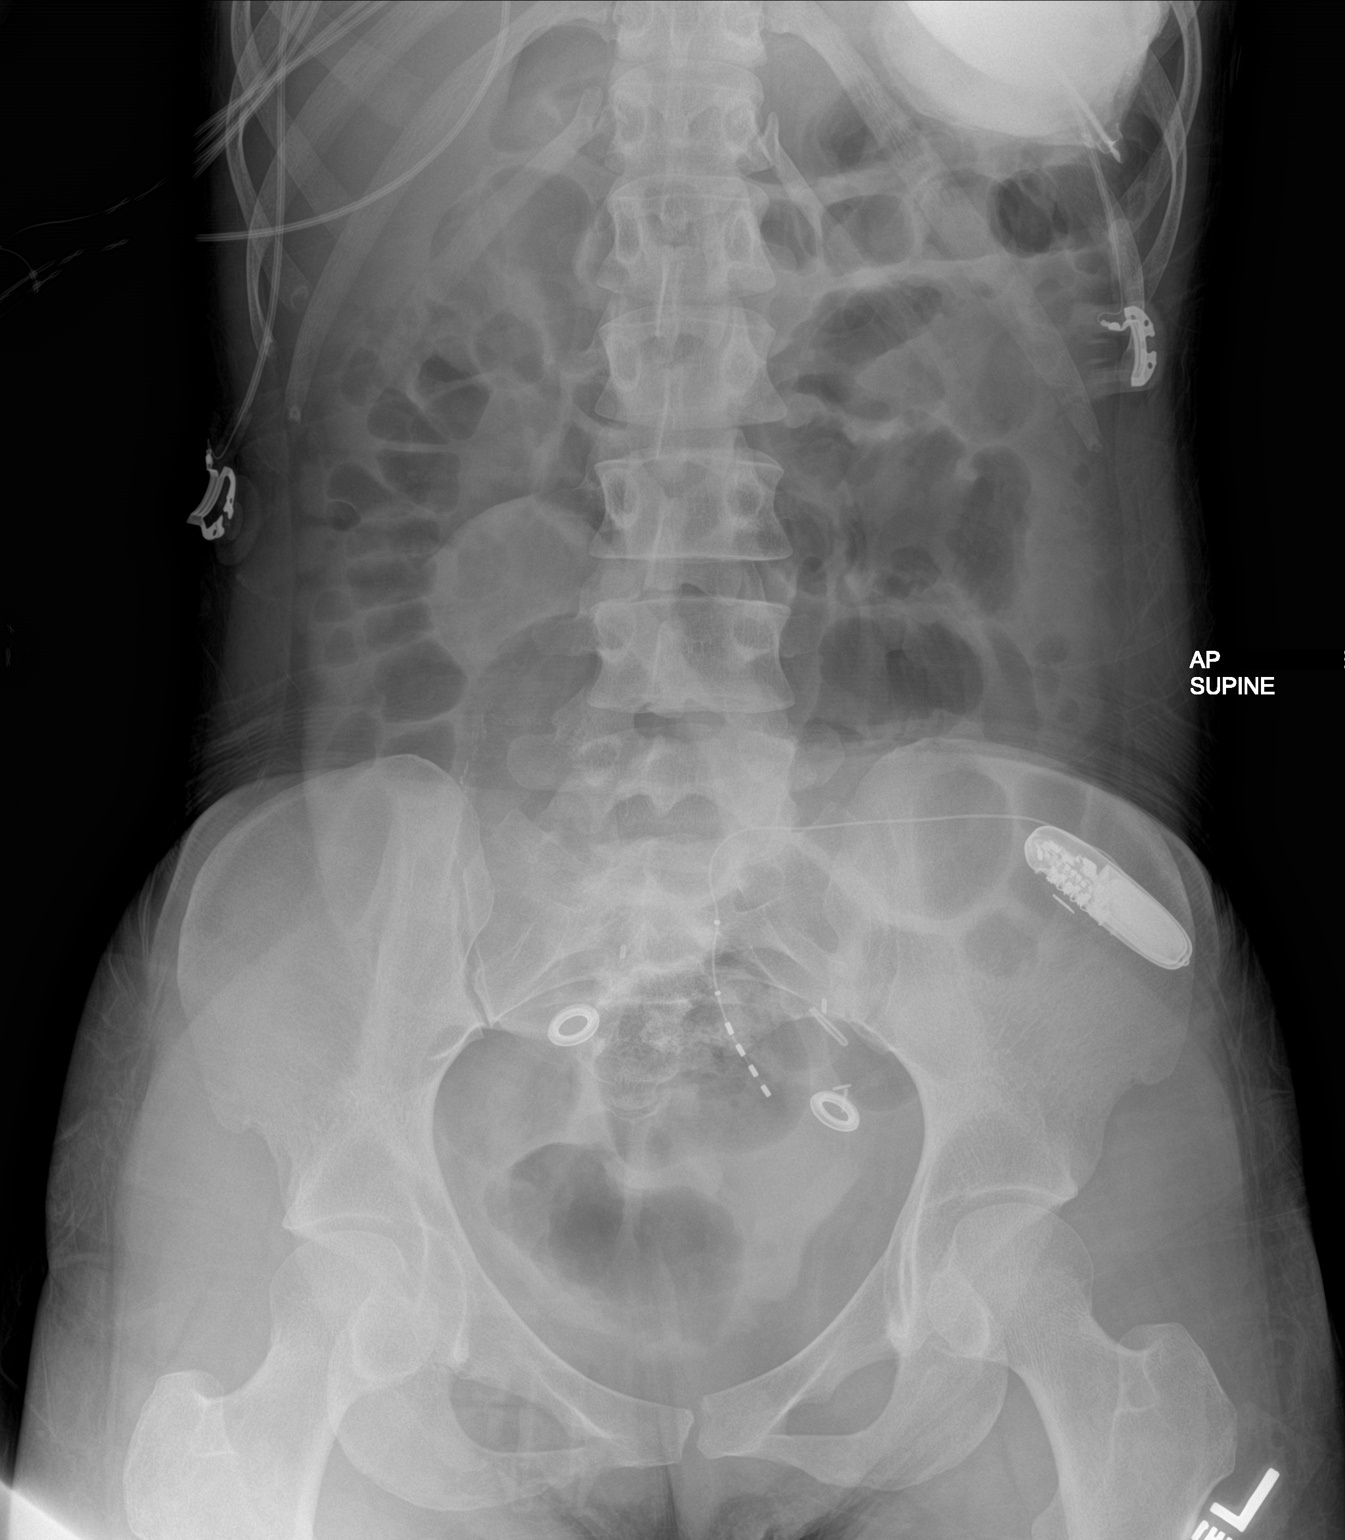

[1 of 1 positions shown; findings below may reference images not displayed]

FINDINGS: The upper abdomen is collimated off view with suggestion of the tip
of an enteric tube overlying the gastric lumen. PO contrast
opacifies the visualized portions of the gastric lumen. No PO
contrast noted within the duodenum or distally. Dilated gaseous
loops of small bowel within the left mid abdomen gas noted within
the colon.

Neural stimulator overlying the left iliac bone with leads overlying
the pelvis. Surgical clips overlie the pelvis. Bowel anastomotic
staples noted overlying the right lower abdomen.
IMPRESSION: 1. PO contrast within the gastric lumen. Recommend serial XR abdomen
for further evaluation.
2. Small bowel gaseous dilatation and gas within the colon. Findings
suggestive of an ileus.

## 2021-01-02 IMAGING — DX DG ABDOMEN 2V
2 series · 2 of 2 positions shown · non-contrast
Comparison: April 17, 2020.

CLINICAL DATA: Small bowel obstruction.

EXAM:
ABDOMEN - 2 VIEW

[abdomen erect]
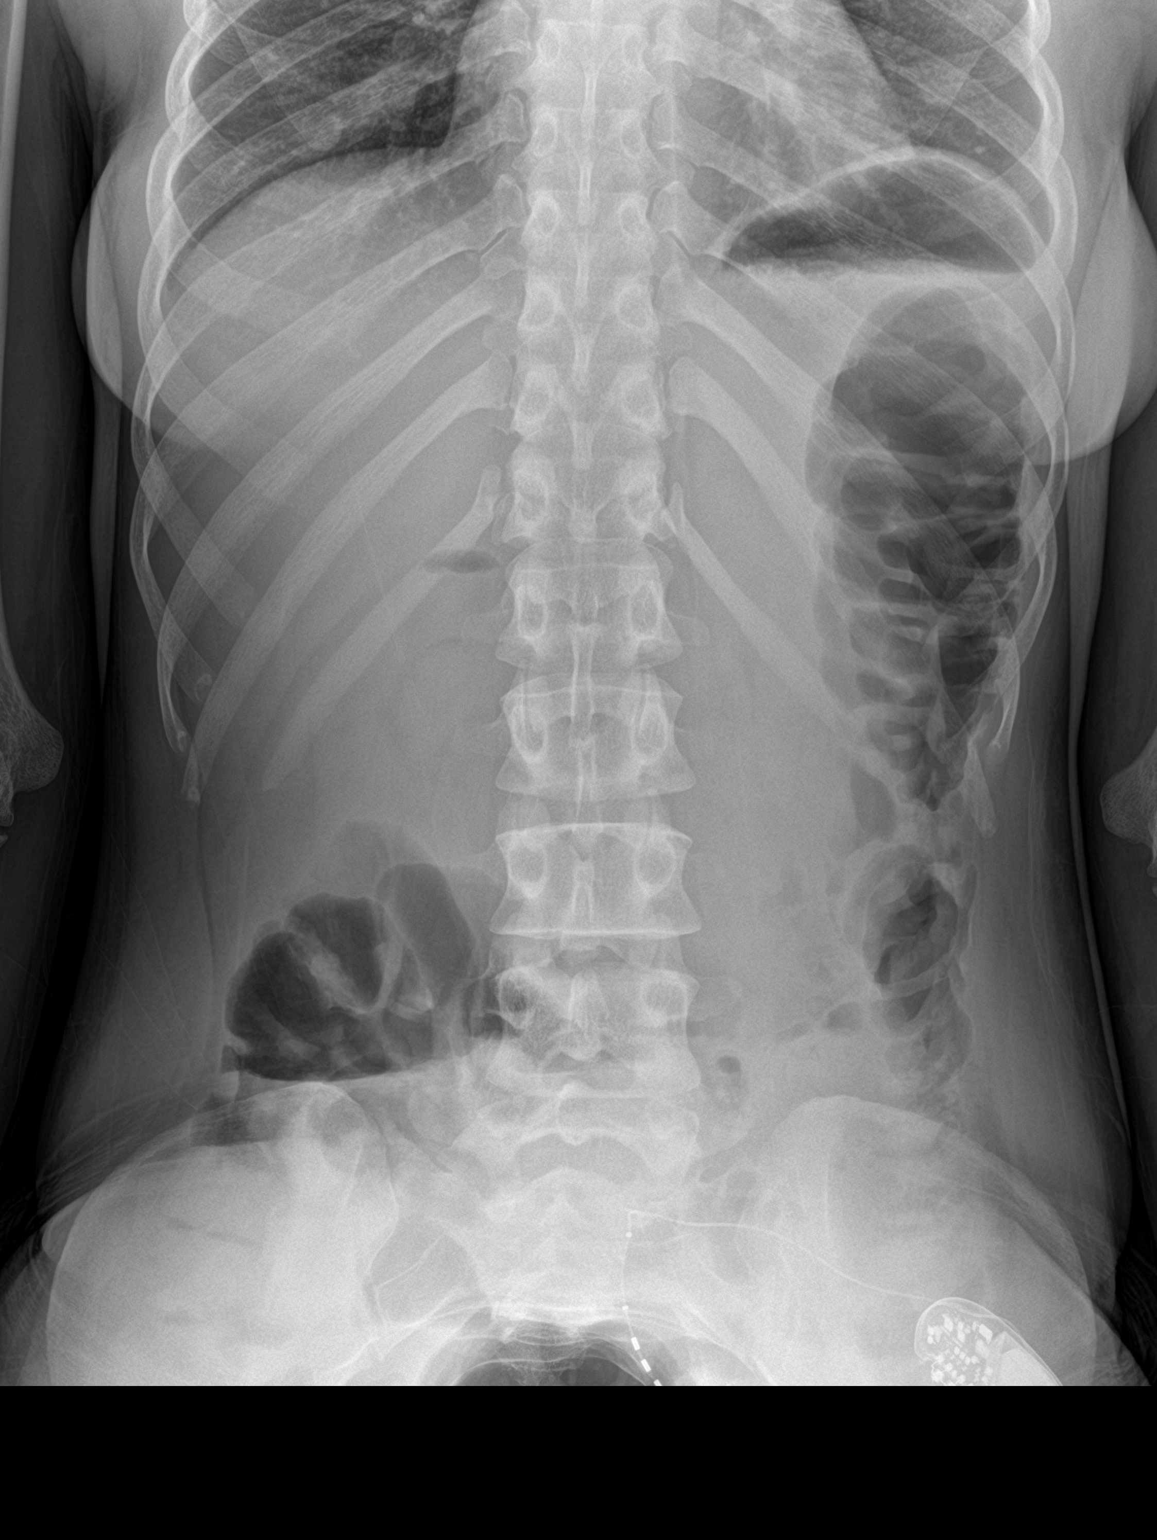

[abdomen supine]
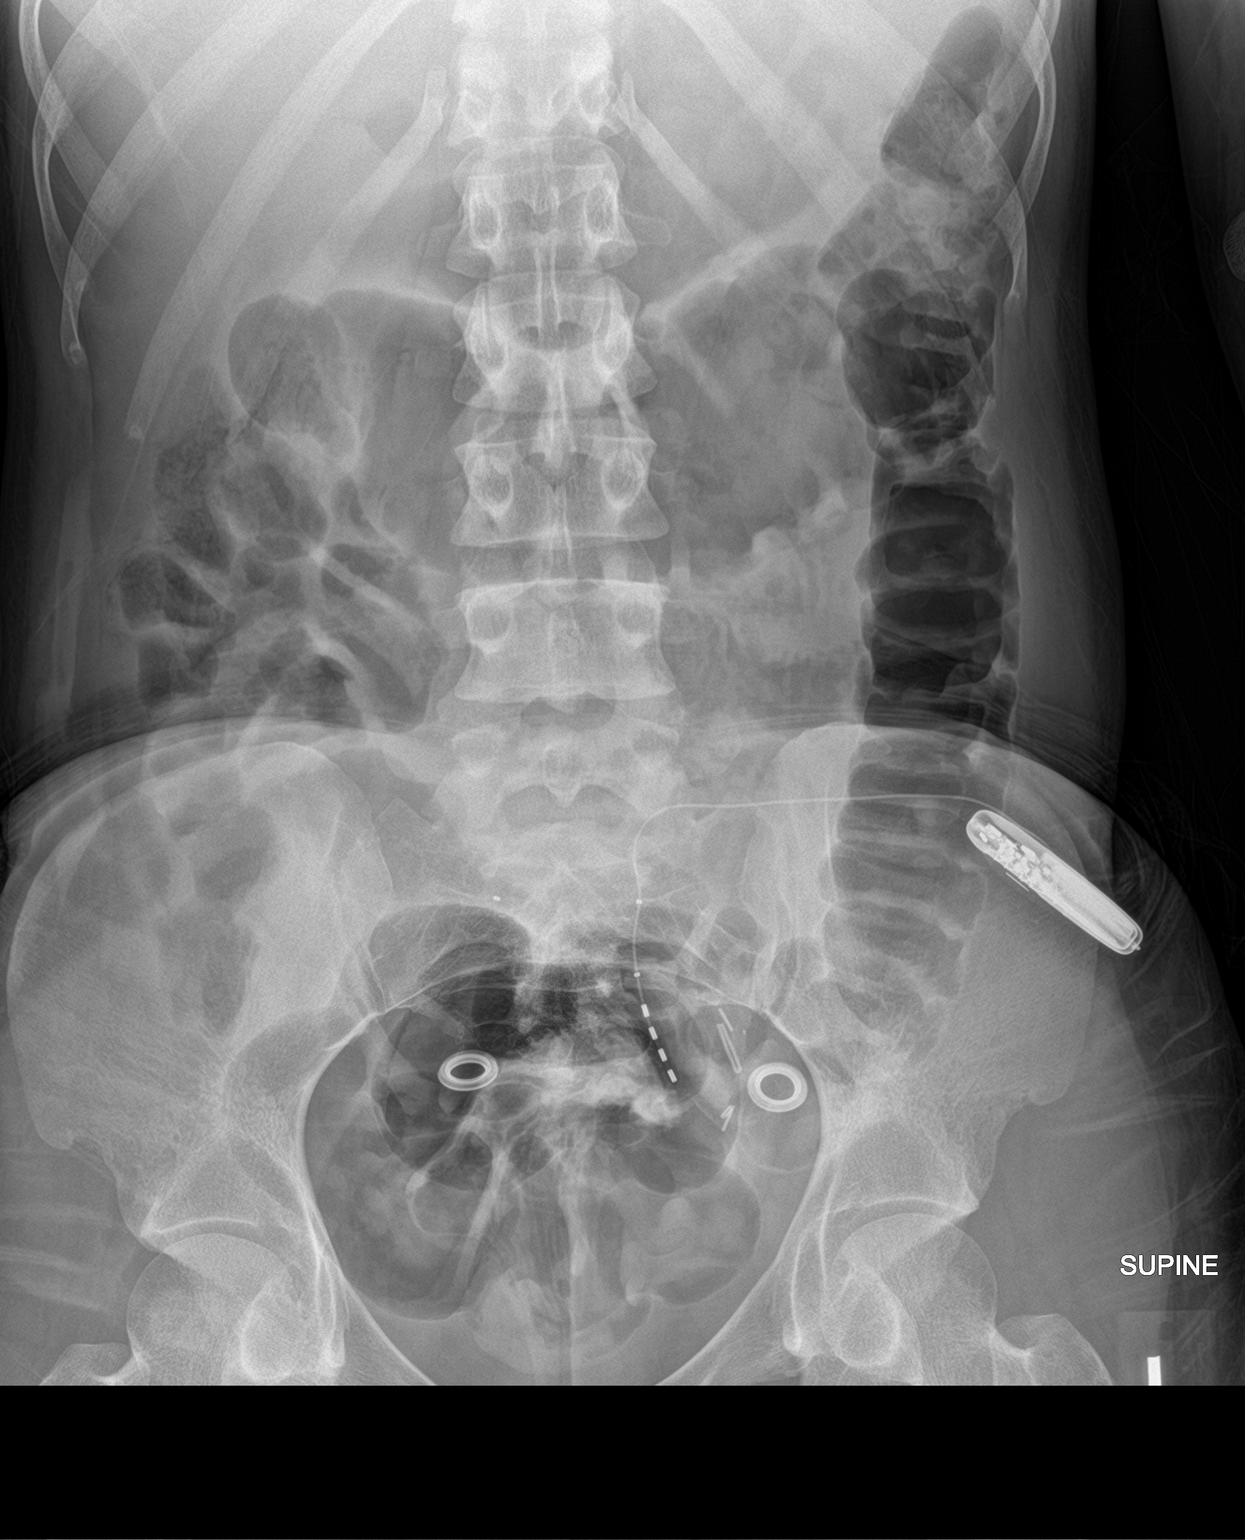

[2 of 2 positions shown; findings below may reference images not displayed]

FINDINGS: The bowel gas pattern is normal. There is no evidence of free air.
No radio-opaque calculi or other significant radiographic
abnormality is seen.
IMPRESSION: No evidence of bowel obstruction or ileus.

## 2021-01-10 ENCOUNTER — Other Ambulatory Visit: Payer: Self-pay | Admitting: Surgery

## 2021-01-28 ENCOUNTER — Encounter: Payer: Self-pay | Admitting: Hematology and Oncology

## 2021-02-01 ENCOUNTER — Other Ambulatory Visit: Payer: Self-pay

## 2021-02-01 DIAGNOSIS — D696 Thrombocytopenia, unspecified: Secondary | ICD-10-CM

## 2021-02-01 NOTE — Progress Notes (Signed)
MD reviewed patient's Mychart message regarding request for bone marrow biopsy due to low WBC count.   Patient is scheduled for Wednesday 8/10 for bone marrow biopsy.  [facility] notified.  Pt aware.

## 2021-02-07 NOTE — Progress Notes (Signed)
INDICATION: Thrombocytopenia and anemia   Bone Marrow Biopsy and Aspiration Procedure Note   Informed consent was obtained and potential risks including bleeding, infection and pain were reviewed with the patient.  The patient's name, date of birth, identification, consent and allergies were verified prior to the start of procedure and time out was performed.  The right posterior iliac crest was chosen as the site of biopsy.  The skin was prepped with ChloraPrep.   8 cc of 1% lidocaine was used to provide local anaesthesia.   10 cc of bone marrow aspirate was obtained followed by 1cm biopsy.  Pressure was applied to the biopsy site and bandage was placed over the biopsy site. Patient was made to lie on the back for 15 mins prior to discharge.  The procedure was tolerated well. COMPLICATIONS: None BLOOD LOSS: none The patient was discharged home in stable condition with a 1 week follow up to review results.  Patient was provided with post bone marrow biopsy instructions and instructed to call if there was any bleeding or worsening pain.  Specimens sent for flow cytometry, cytogenetics and additional studies.  Signed Harriette Ohara, MD

## 2021-02-08 ENCOUNTER — Inpatient Hospital Stay: Payer: Medicaid Other

## 2021-02-08 ENCOUNTER — Other Ambulatory Visit: Payer: Self-pay

## 2021-02-08 ENCOUNTER — Inpatient Hospital Stay (HOSPITAL_BASED_OUTPATIENT_CLINIC_OR_DEPARTMENT_OTHER): Payer: Medicaid Other | Admitting: Hematology and Oncology

## 2021-02-08 ENCOUNTER — Telehealth: Payer: Self-pay

## 2021-02-08 ENCOUNTER — Inpatient Hospital Stay: Payer: Medicaid Other | Attending: Hematology and Oncology | Admitting: Hematology and Oncology

## 2021-02-08 VITALS — BP 122/74 | HR 91 | Temp 98.5°F | Resp 18

## 2021-02-08 DIAGNOSIS — D696 Thrombocytopenia, unspecified: Secondary | ICD-10-CM | POA: Diagnosis present

## 2021-02-08 DIAGNOSIS — D500 Iron deficiency anemia secondary to blood loss (chronic): Secondary | ICD-10-CM | POA: Insufficient documentation

## 2021-02-08 DIAGNOSIS — D72818 Other decreased white blood cell count: Secondary | ICD-10-CM

## 2021-02-08 LAB — CBC WITH DIFFERENTIAL (CANCER CENTER ONLY)
Abs Immature Granulocytes: 0.03 10*3/uL (ref 0.00–0.07)
Basophils Absolute: 0 10*3/uL (ref 0.0–0.1)
Basophils Relative: 0 %
Eosinophils Absolute: 0.1 10*3/uL (ref 0.0–0.5)
Eosinophils Relative: 1 %
HCT: 32.1 % — ABNORMAL LOW (ref 36.0–46.0)
Hemoglobin: 10.8 g/dL — ABNORMAL LOW (ref 12.0–15.0)
Immature Granulocytes: 1 %
Lymphocytes Relative: 25 %
Lymphs Abs: 1.6 10*3/uL (ref 0.7–4.0)
MCH: 30 pg (ref 26.0–34.0)
MCHC: 33.6 g/dL (ref 30.0–36.0)
MCV: 89.2 fL (ref 80.0–100.0)
Monocytes Absolute: 0.3 10*3/uL (ref 0.1–1.0)
Monocytes Relative: 5 %
Neutro Abs: 4.3 10*3/uL (ref 1.7–7.7)
Neutrophils Relative %: 68 %
Platelet Count: 181 10*3/uL (ref 150–400)
RBC: 3.6 MIL/uL — ABNORMAL LOW (ref 3.87–5.11)
RDW: 13.2 % (ref 11.5–15.5)
WBC Count: 6.2 10*3/uL (ref 4.0–10.5)
nRBC: 0 % (ref 0.0–0.2)

## 2021-02-08 NOTE — Patient Instructions (Signed)
Bone Marrow Aspiration and Bone Marrow Biopsy, Adult, Care After This sheet gives you information about how to care for yourself after your procedure. Your health care provider may also give you more specific instructions. If you have problems or questions, contact your health careprovider. What can I expect after the procedure? After the procedure, it is common to have: Mild pain and tenderness. Swelling. Bruising. Follow these instructions at home: Puncture site care  Follow instructions from your health care provider about how to take care of the puncture site. Make sure you: Wash your hands with soap and water before and after you change your bandage (dressing). If soap and water are not available, use hand sanitizer. Change your dressing as told by your health care provider. Check your puncture site every day for signs of infection. Check for: More redness, swelling, or pain. Fluid or blood. Warmth. Pus or a bad smell.  Activity Return to your normal activities as told by your health care provider. Ask your health care provider what activities are safe for you. Do not lift anything that is heavier than 10 lb (4.5 kg), or the limit that you are told, until your health care provider says that it is safe. Do not drive for 24 hours if you were given a sedative during your procedure. General instructions  Take over-the-counter and prescription medicines only as told by your health care provider. Do not take baths, swim, or use a hot tub until your health care provider approves. Ask your health care provider if you may take showers. You may only be allowed to take sponge baths. If directed, put ice on the affected area. To do this: Put ice in a plastic bag. Place a towel between your skin and the bag. Leave the ice on for 20 minutes, 2-3 times a day. Keep all follow-up visits as told by your health care provider. This is important.  Contact a health care provider if: Your pain is not  controlled with medicine. You have a fever. You have more redness, swelling, or pain around the puncture site. You have fluid or blood coming from the puncture site. Your puncture site feels warm to the touch. You have pus or a bad smell coming from the puncture site. Summary After the procedure, it is common to have mild pain, tenderness, swelling, and bruising. Follow instructions from your health care provider about how to take care of the puncture site and what activities are safe for you. Take over-the-counter and prescription medicines only as told by your health care provider. Contact a health care provider if you have any signs of infection, such as fluid or blood coming from the puncture site. This information is not intended to replace advice given to you by your health care provider. Make sure you discuss any questions you have with your healthcare provider. Document Revised: 11/04/2018 Document Reviewed: 11/04/2018 Elsevier Patient Education  2022 Elsevier Inc.  

## 2021-02-08 NOTE — Telephone Encounter (Signed)
MD notified RN that patient did not have post BMBX labs drawn.  MD request to see if patient can return for labs.   RN placed call to patient, scheduled for 2:30 labs.   RN provided education on signs and symptoms to monitor post bone marrow procedure.  Verbalized understanding and agreement.

## 2021-02-08 NOTE — Progress Notes (Signed)
After Bone Marrow biopsy, pt left infusion room before this RN was able to check vital signs, check biopsy site or obtain labs. MD aware and pt called.

## 2021-02-10 ENCOUNTER — Telehealth: Payer: Self-pay | Admitting: Hematology and Oncology

## 2021-02-10 NOTE — Telephone Encounter (Signed)
Scheduled per 8/10 los. Called and spoke with pt confirmed 8/17 appt

## 2021-02-14 NOTE — Progress Notes (Signed)
  HEMATOLOGY-ONCOLOGY TELEPHONE VISIT PROGRESS NOTE  I connected with [name] on 02/15/2021 at 12:00 PM EDT by telephone and verified that I am speaking with the correct person using two identifiers.  I discussed the limitations, risks, security and privacy concerns of performing an evaluation and management service by telephone and the availability of in person appointments.  I also discussed with the patient that there may be a patient responsible charge related to this service. The patient expressed understanding and agreed to proceed.   History of Present Illness: [name] is a 30 y.o. female with above-mentioned history of thrombocytopenia and anemia. She presents via telephone today for follow-up.   Assessment Plan:  Iron deficiency anemia due to chronic blood loss History of heavy menstrual bleeding along with urinary tract bleeding from chronic interstitial cystitis.  Treatment summary: IV iron given July 2017, May 2019, November 2019, November 2021 Hospitalization 04/13/2020-04/18/2020: Small bowel obstruction   Lab review:  11/05/2018: Hemoglobin 12.3, MCV 90.5 Iron studies: Ferritin 189, TIBC 255, iron saturation 27% 05/12/19: Hb 12.4, MCV: 92.6, Ferritin: 95, Iron sat: 31% 05/11/2020: Hemoglobin 9.9, MCV 87.2, iron saturation 6%, TIBC 366, ferritin 9 02/08/2021: Hemoglobin 10.8, MCV 89.2  Bone marrow biopsy 02/08/2021: Hypocellular bone marrow with trilineage hematopoiesis, storage iron present, FISH and cytogenetics are pending  I discussed with the patient the results of bone marrow biopsy which does not show any evidence of bone marrow disorder other than hypocellularity of 30%.  No malignancy identified.  I will call her with the results of cytogenetics and FISH studies. If they are also normal then we can see her in 1 year. Her gynecologist has started her on some hormone therapies which will stop her menstrual cycle for the next 3 months.  Return to  clinic in 1 year for follow-up with labs    I discussed the assessment and treatment plan with the patient. The patient was provided an opportunity to ask questions and all were answered. The patient agreed with the plan and demonstrated an understanding of the instructions. The patient was advised to call back or seek an in-person evaluation if the symptoms worsen or if the condition fails to improve as anticipated.   Total time spent: 12 mins including non-face to face time and time spent for planning, charting and coordination of care  Rulon Eisenmenger, MD 02/15/2021    I, Thana Ates, am acting as scribe for Nicholas Lose, MD.  I have reviewed the above documentation for accuracy and completeness, and I agree with the above.

## 2021-02-15 ENCOUNTER — Inpatient Hospital Stay (HOSPITAL_BASED_OUTPATIENT_CLINIC_OR_DEPARTMENT_OTHER): Payer: Medicaid Other | Admitting: Hematology and Oncology

## 2021-02-15 DIAGNOSIS — D500 Iron deficiency anemia secondary to blood loss (chronic): Secondary | ICD-10-CM | POA: Diagnosis not present

## 2021-02-15 NOTE — Assessment & Plan Note (Signed)
History of heavy menstrual bleeding along with urinary tract bleeding from chronic interstitial cystitis.  Treatment summary: IV iron given July 2017, May 2019, November 2019, November 2021 Hospitalization 04/13/2020-04/18/2020: Small bowel obstruction  Lab review: 11/05/2018: Hemoglobin 12.3, MCV 90.5 Iron studies: Ferritin 189, TIBC 255, iron saturation 27% 05/12/19: Hb 12.4, MCV: 92.6, Ferritin: 95, Iron sat: 31% 05/11/2020: Hemoglobin 9.9, MCV 87.2, iron saturation 6%, TIBC 366, ferritin 9 02/08/2021: Hemoglobin 10.8, MCV 89.2  Bone marrow biopsy 02/08/2021: Hypocellular bone marrow with trilineage hematopoiesis, storage iron present, FISH and cytogenetics are pending  I discussed with the patient the results of bone marrow biopsy which does not show any evidence of bone marrow disorder other than hypocellularity of 30%.  No malignancy identified.  Return to clinic in 1 year for follow-up with labs

## 2021-02-16 ENCOUNTER — Encounter: Payer: Self-pay | Admitting: Hematology and Oncology

## 2021-02-16 ENCOUNTER — Encounter (HOSPITAL_COMMUNITY): Payer: Self-pay | Admitting: Hematology and Oncology

## 2021-02-20 ENCOUNTER — Telehealth: Payer: Self-pay | Admitting: Hematology and Oncology

## 2021-02-20 ENCOUNTER — Encounter (HOSPITAL_COMMUNITY): Payer: Self-pay | Admitting: Hematology and Oncology

## 2021-02-20 NOTE — Telephone Encounter (Signed)
I left a voicemail that the [facility] panel for MDS came back negative as well as cytogenetics.

## 2021-02-23 LAB — SURGICAL PATHOLOGY

## 2021-03-08 ENCOUNTER — Other Ambulatory Visit: Payer: Self-pay

## 2021-03-08 ENCOUNTER — Encounter: Payer: Self-pay | Admitting: Family Medicine

## 2021-03-08 ENCOUNTER — Ambulatory Visit (INDEPENDENT_AMBULATORY_CARE_PROVIDER_SITE_OTHER): Payer: Medicaid Other | Admitting: Family Medicine

## 2021-03-08 VITALS — Ht 62.25 in | Wt 105.0 lb

## 2021-03-08 DIAGNOSIS — G5792 Unspecified mononeuropathy of left lower limb: Secondary | ICD-10-CM | POA: Insufficient documentation

## 2021-03-08 NOTE — Assessment & Plan Note (Signed)
Possible to be radicular component given the pattern.  Her ferritin was found to be normal.  Thyroid was normal as well.  Has a normal exam today. -Counseled on home exercise therapy and supportive care. -B12 and folate. -Could consider nerve study.

## 2021-03-08 NOTE — Progress Notes (Signed)
[name] - 30 y.o. female MRN [id number]  Date of birth: October 13, 1990  SUBJECTIVE:  Including CC & ROS.  No chief complaint on file.   [name] is a 30 y.o. female that is presenting with left-sided leg ultrasensation and pain.  She is having altered sensation at the great toe and second digit.  She has been on gabapentin and prednisone with limited improvement.  She also experiences pain radiates proximally up the medial portion of her lower leg.   Review of Systems See HPI   HISTORY: Past Medical, Surgical, Social, and Family History Reviewed & Updated per EMR.   Pertinent Historical Findings include:  Past Medical History:  Diagnosis Date   Anemia    Anxiety    Arthritis    Bilateral hearing loss    Bladder mass    Chronic interstitial cystitis    Chronic interstitial cystitis with hematuria    2016   Chronic sinusitis    Fibromyalgia    GERD (gastroesophageal reflux disease)    Gross hematuria    History of Graves' disease    tx done in 2007   History of migraine    History of recurrent UTIs    Hypothyroidism    Iron deficiency anemia    due to SGS   Lower urinary tract symptoms (LUTS)    Lupus (HCC)    rheumologist--  dr Janalyn Rouse devashwer   Malabsorption syndrome    Migraine    Migraine    Passage of loose stools    chronic --  due to short gut syndrome   Pleuritis    S/P radioactive iodine thyroid ablation    2007   SGS (short gut syndrome)    Short gut syndrome    Sjogren's disease (HCC)    Vitiligo    Wears glasses     Past Surgical History:  Procedure Laterality Date   BOWEL RESECTION  newborn   necrotizing enterocolitis (liver patched)   BRONCHOSCOPY  2014   CESAREAN SECTION  01/02/2012   Procedure: CESAREAN SECTION;  Surgeon: Lenoard Aden, MD;  Location: [facility];  Service: Gynecology;  Laterality: N/A;   COLONOSCOPY  07-29-2013   CYSTECTOMY W/ URETEROILEAL CONDUIT  10/2017   CYSTOSCOPY WITH BIOPSY N/A 11/23/2014    Procedure: CYSTOSCOPY WITH BLADDER  BIOPSY AND FULGERATION;  Surgeon: Bjorn Pippin, MD;  Location: [facility];  Service: Urology;  Laterality: N/A;   KNEE ARTHROSCOPY Right 2008   LIVER SURGERY     MULTIPLE EXTRACTIONS WITH ALVEOLOPLASTY N/A 03/24/2018   Procedure: EXTRACTIONS X 9;  Surgeon: Ocie Doyne, DDS;  Location: [facility];  Service: Oral Surgery;  Laterality: N/A;   SMALL INTESTINE SURGERY     WISDOM TOOTH EXTRACTION      Family History  Problem Relation Age of Onset   Diabetes Father    Endometriosis Mother    Fibromyalgia Mother    Heart disease Maternal Grandmother    Hypertension Maternal Grandmother    Osteoporosis Maternal Grandmother    Heart disease Maternal Grandfather    Hypertension Maternal Grandfather    Prostate cancer Maternal Grandfather    Heart disease Paternal Grandmother    Other Neg Hx    Colon cancer Neg Hx    Pancreatic cancer Neg Hx    Stomach cancer Neg Hx     Social History   Socioeconomic History   Marital status: Divorced    Spouse name: Not on file   Number of  children: 1   Years of education: college   Highest education level: Not on file  Occupational History   Occupation: N/A  Tobacco Use   Smoking status: Never   Smokeless tobacco: Never  Substance and Sexual Activity   Alcohol use: No    Alcohol/week: 0.0 standard drinks   Drug use: No   Sexual activity: Not on file  Other Topics Concern   Not on file  Social History Narrative   Lives at home with family.   Right-handed.   6 cups tea throughout the week.   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  Her ferritin was found to be normal.  Thyroid was normal as well.  Has a normal exam today. -Counseled on home exercise therapy and supportive care. -B12 and folate. -Could consider nerve study.

## 2021-03-08 NOTE — Patient Instructions (Signed)
Good to see you Please try the exercises  I will call with the results from today.   Please send me a message in MyChart with any questions or updates.  Please see me back in 4 weeks.   --Dr. Jordan Likes

## 2021-03-09 LAB — B12 AND FOLATE PANEL
Folate: >20 ng/mL (ref >3.0)
Vitamin B-12: 1557 pg/mL — ABNORMAL HIGH (ref 232–1245)

## 2021-03-10 ENCOUNTER — Other Ambulatory Visit: Payer: Self-pay | Admitting: Family Medicine

## 2021-03-10 DIAGNOSIS — G5792 Unspecified mononeuropathy of left lower limb: Secondary | ICD-10-CM

## 2021-03-10 NOTE — Progress Notes (Signed)
Informed of results.  We will proceed with referral to neurology for nerve study and left lower extremity.  Myra Rude, MD [facility] 03/10/2021, 2:19 PM

## 2021-03-30 ENCOUNTER — Encounter: Payer: Self-pay | Admitting: Family Medicine

## 2021-04-10 ENCOUNTER — Ambulatory Visit: Payer: Medicaid Other | Admitting: Family Medicine

## 2021-04-10 NOTE — Progress Notes (Deleted)
[name] - 30 y.o. female MRN [id number]  Date of birth: 1990/12/01  SUBJECTIVE:  Including CC & ROS.  No chief complaint on file.   [name] is a 30 y.o. female that is  ***.  ***   Review of Systems See HPI   HISTORY: Past Medical, Surgical, Social, and Family History Reviewed & Updated per EMR.   Pertinent Historical Findings include:  Past Medical History:  Diagnosis Date   Anemia    Anxiety    Arthritis    Bilateral hearing loss    Bladder mass    Chronic interstitial cystitis    Chronic interstitial cystitis with hematuria    2016   Chronic sinusitis    Fibromyalgia    GERD (gastroesophageal reflux disease)    Gross hematuria    History of Graves' disease    tx done in 2007   History of migraine    History of recurrent UTIs    Hypothyroidism    Iron deficiency anemia    due to SGS   Lower urinary tract symptoms (LUTS)    Lupus (HCC)    rheumologist--  dr Janalyn Rouse devashwer   Malabsorption syndrome    Migraine    Migraine    Passage of loose stools    chronic --  due to short gut syndrome   Pleuritis    S/P radioactive iodine thyroid ablation    2007   SGS (short gut syndrome)    Short gut syndrome    Sjogren's disease (HCC)    Vitiligo    Wears glasses     Past Surgical History:  Procedure Laterality Date   BOWEL RESECTION  newborn   necrotizing enterocolitis (liver patched)   BRONCHOSCOPY  2014   CESAREAN SECTION  01/02/2012   Procedure: CESAREAN SECTION;  Surgeon: Lenoard Aden, MD;  Location: [facility];  Service: Gynecology;  Laterality: N/A;   COLONOSCOPY  07-29-2013   CYSTECTOMY W/ URETEROILEAL CONDUIT  10/2017   CYSTOSCOPY WITH BIOPSY N/A 11/23/2014   Procedure: CYSTOSCOPY WITH BLADDER  BIOPSY AND FULGERATION;  Surgeon: Bjorn Pippin, MD;  Location: [facility];  Service: Urology;  Laterality: N/A;   KNEE ARTHROSCOPY Right 2008   LIVER SURGERY     MULTIPLE EXTRACTIONS WITH ALVEOLOPLASTY N/A 03/24/2018    Procedure: EXTRACTIONS X 9;  Surgeon: Ocie Doyne, DDS;  Location: [facility];  Service: Oral Surgery;  Laterality: N/A;   SMALL INTESTINE SURGERY     WISDOM TOOTH EXTRACTION      Family History  Problem Relation Age of Onset   Diabetes Father    Endometriosis Mother    Fibromyalgia Mother    Heart disease Maternal Grandmother    Hypertension Maternal Grandmother    Osteoporosis Maternal Grandmother    Heart disease Maternal Grandfather    Hypertension Maternal Grandfather    Prostate cancer Maternal Grandfather    Heart disease Paternal Grandmother    Other Neg Hx    Colon cancer Neg Hx    Pancreatic cancer Neg Hx    Stomach cancer Neg Hx     Social History   Socioeconomic History   Marital status: Divorced    Spouse name: Not on file   Number of children: 1   Years of education: college   Highest education level: Not on file  Occupational History   Occupation: N/A  Tobacco Use   Smoking status: Never   Smokeless tobacco: Never  Substance and Sexual Activity  Alcohol use: No    Alcohol/week: 0.0 standard drinks   Drug use: No   Sexual activity: Not on file  Other Topics Concern   Not on file  Social History Narrative   Lives at home with family.   Right-handed.   6 cups tea throughout the week.   Social Determinants of Health   Financial Resource Strain: Not on file  Food Insecurity: Not on file  Transportation Needs: Not on file  Physical Activity: Not on file  Stress: Not on file  Social Connections: Not on file  Intimate Partner Violence: Not on file     PHYSICAL EXAM:  VS: There were no vitals taken for this visit. Physical Exam Gen: NAD, alert, cooperative with exam, well-appearing MSK:  ***      ASSESSMENT & PLAN:   No problem-specific Assessment & Plan notes found for this encounter.

## 2021-05-11 ENCOUNTER — Other Ambulatory Visit: Payer: Self-pay | Admitting: *Deleted

## 2021-05-11 DIAGNOSIS — D500 Iron deficiency anemia secondary to blood loss (chronic): Secondary | ICD-10-CM

## 2021-05-12 ENCOUNTER — Inpatient Hospital Stay: Payer: Medicaid Other | Attending: Hematology and Oncology

## 2021-05-12 ENCOUNTER — Other Ambulatory Visit: Payer: Self-pay

## 2021-05-12 DIAGNOSIS — Z79899 Other long term (current) drug therapy: Secondary | ICD-10-CM | POA: Insufficient documentation

## 2021-05-12 DIAGNOSIS — D696 Thrombocytopenia, unspecified: Secondary | ICD-10-CM | POA: Insufficient documentation

## 2021-05-12 DIAGNOSIS — D500 Iron deficiency anemia secondary to blood loss (chronic): Secondary | ICD-10-CM | POA: Diagnosis not present

## 2021-05-12 DIAGNOSIS — N3010 Interstitial cystitis (chronic) without hematuria: Secondary | ICD-10-CM | POA: Diagnosis not present

## 2021-05-12 LAB — CBC WITH DIFFERENTIAL (CANCER CENTER ONLY)
Abs Immature Granulocytes: 0.01 10*3/uL (ref 0.00–0.07)
Basophils Absolute: 0 10*3/uL (ref 0.0–0.1)
Basophils Relative: 0 %
Eosinophils Absolute: 0 10*3/uL (ref 0.0–0.5)
Eosinophils Relative: 1 %
HCT: 35.6 % — ABNORMAL LOW (ref 36.0–46.0)
Hemoglobin: 11.9 g/dL — ABNORMAL LOW (ref 12.0–15.0)
Immature Granulocytes: 0 %
Lymphocytes Relative: 36 %
Lymphs Abs: 1.6 10*3/uL (ref 0.7–4.0)
MCH: 29 pg (ref 26.0–34.0)
MCHC: 33.4 g/dL (ref 30.0–36.0)
MCV: 86.8 fL (ref 80.0–100.0)
Monocytes Absolute: 0.3 10*3/uL (ref 0.1–1.0)
Monocytes Relative: 6 %
Neutro Abs: 2.5 10*3/uL (ref 1.7–7.7)
Neutrophils Relative %: 57 %
Platelet Count: 213 10*3/uL (ref 150–400)
RBC: 4.1 MIL/uL (ref 3.87–5.11)
RDW: 15.5 % (ref 11.5–15.5)
WBC Count: 4.3 10*3/uL (ref 4.0–10.5)
nRBC: 0 % (ref 0.0–0.2)

## 2021-05-12 LAB — CMP (CANCER CENTER ONLY)
ALT: 18 U/L (ref 0–44)
AST: 15 U/L (ref 15–41)
Albumin: 3.5 g/dL (ref 3.5–5.0)
Alkaline Phosphatase: 78 U/L (ref 38–126)
Anion gap: 8 (ref 5–15)
BUN: 8 mg/dL (ref 6–20)
CO2: 16 mmol/L — ABNORMAL LOW (ref 22–32)
Calcium: 8.6 mg/dL — ABNORMAL LOW (ref 8.9–10.3)
Chloride: 115 mmol/L — ABNORMAL HIGH (ref 98–111)
Creatinine: 0.81 mg/dL (ref 0.44–1.00)
GFR, Estimated: >60 mL/min (ref >60)
Glucose, Bld: 88 mg/dL (ref 70–99)
Potassium: 3.6 mmol/L (ref 3.5–5.1)
Sodium: 139 mmol/L (ref 135–145)
Total Bilirubin: 2 mg/dL — ABNORMAL HIGH (ref 0.3–1.2)
Total Protein: 7 g/dL (ref 6.5–8.1)

## 2021-05-12 LAB — IRON AND TIBC
Iron: 81 ug/dL (ref 41–142)
Saturation Ratios: 24 % (ref 21–57)
TIBC: 336 ug/dL (ref 236–444)
UIBC: 255 ug/dL (ref 120–384)

## 2021-05-12 LAB — FERRITIN: Ferritin: 21 ng/mL (ref 11–307)

## 2021-05-13 NOTE — Progress Notes (Signed)
Patient Care Team: Bernita Buffy as PCP - General (Physician Assistant)  DIAGNOSIS:    ICD-10-CM   1. Iron deficiency anemia due to chronic blood loss  D50.0       CHIEF COMPLIANT: Follow-up of thrombocytopenia and anemia  INTERVAL HISTORY: [name] is a 30 y.o. with above-mentioned history of thrombocytopenia and anemia. Labs on 05/12/2021 showed Hg 11.9, HCT 35.6, PLT 213, Ferritin 21, iron sat 24. She presents to the clinic today for follow-up.   ALLERGIES:  is allergic to penicillins, buprenorphine hcl, morphine and related, and hydrocodone-acetaminophen.  MEDICATIONS:  Current Outpatient Medications  Medication Sig Dispense Refill   Adapalene 0.3 % gel Apply topically.     AMBULATORY NON FORMULARY MEDICATION Iron Infusions every 3 months done at [facility]- ordered thru [facility]     cetirizine (ZYRTEC) 5 MG tablet Take 5 mg by mouth daily.     clindamycin (CLEOCIN T) 1 % external solution Apply a thin layer to the face/chest/back every morning. Do not rinse off.     emtricitabine-tenofovir (TRUVADA) 200-300 MG tablet Take 1 tablet by mouth daily.     EPINEPHrine 0.3 mg/0.3 mL IJ SOAJ injection Inject 0.3 mg into the muscle as needed for anaphylaxis.  (Patient not taking: Reported on 11/30/2020)     Erenumab-aooe (AIMOVIG) 70 MG/ML SOAJ Inject 70 mg into the skin at bedtime. 1 mL 11   Fluocinolone Acetonide Body 0.01 % OIL Apply to scalp daily as needed for scalp lesions/itching.     hydroxychloroquine (PLAQUENIL) 200 MG tablet Take 400 mg by mouth every morning.      hydroxypropyl methylcellulose / hypromellose (ISOPTO TEARS / GONIOVISC) 2.5 % ophthalmic solution Place 2 drops into both eyes 3 (three) times daily as needed for dry eyes.      levothyroxine (SYNTHROID) 100 MCG tablet Take 112 mcg by mouth daily.     mirtazapine (REMERON) 15 MG tablet Take 1 tablet by mouth daily.     omalizumab Geoffry Paradise) 150 MG/ML prefilled syringe Inject 150 mg  into the skin every 28 (twenty-eight) days.      OMEPRAZOLE PO Take 20 mg by mouth at bedtime.      ondansetron (ZOFRAN-ODT) 8 MG disintegrating tablet Take 1 tablet (8 mg total) by mouth every 8 (eight) hours as needed. 20 tablet 6   pantoprazole (PROTONIX) 40 MG tablet Take 1 tablet by mouth in the morning and at bedtime.     prazosin (MINIPRESS) 5 MG capsule Take 1 capsule by mouth in the morning and at bedtime.     predniSONE (DELTASONE) 5 MG tablet Take 1 tablet by mouth as needed.     propranolol (INNOPRAN XL) 80 MG 24 hr capsule Take 1 capsule by mouth daily.     propranolol ER (INDERAL LA) 80 MG 24 hr capsule Take 1 capsule (80 mg total) by mouth at bedtime. 30 capsule 11   SUMAtriptan (IMITREX) 6 MG/0.5ML SOLN injection Inject 0.5 mLs (6 mg total) into the skin every 2 (two) hours as needed for migraine or headache. May repeat in 2 hours if headache persists or recurs. 6 mL 5   tiZANidine (ZANAFLEX) 4 MG tablet Take 1 tablet (4 mg total) by mouth every 8 (eight) hours as needed for muscle spasms. 30 tablet 3   traMADol (ULTRAM) 50 MG tablet Take 1 tablet by mouth every 6 (six) hours.     White Petrolatum-Mineral Oil (WH PETROL-MINERAL OIL-LANOLIN) 0.1-0.1 % OINT Place 1 application  into both eyes at bedtime.     No current facility-administered medications for this visit.    PHYSICAL EXAMINATION: ECOG PERFORMANCE STATUS: 1 - Symptomatic but completely ambulatory  Vitals:   05/15/21 1021  BP: 124/84  Pulse: 66  Resp: 18  Temp: 97.7 F (36.5 C)  SpO2: 100%   Filed Weights   05/15/21 1021  Weight: 102 lb 9.6 oz (46.5 kg)    BREAST: No palpable masses or nodules in either right or left breasts. No palpable axillary supraclavicular or infraclavicular adenopathy no breast tenderness or nipple discharge. (exam performed in the presence of a chaperone)  LABORATORY DATA:  I have reviewed the data as listed CMP Latest Ref Rng & Units 05/12/2021 09/23/2020 04/27/2020  Glucose 70 -  99 mg/dL 88 82 83  BUN 6 - 20 mg/dL $Remove'8 11 14  'ARtFhGu$ Creatinine 0.44 - 1.00 mg/dL 0.81 0.74 0.68  Sodium 135 - 145 mmol/L 139 136 138  Potassium 3.5 - 5.1 mmol/L 3.6 4.6 4.3  Chloride 98 - 111 mmol/L 115(H) 108 108  CO2 22 - 32 mmol/L 16(L) 22 19(L)  Calcium 8.9 - 10.3 mg/dL 8.6(L) 9.0 9.3  Total Protein 6.5 - 8.1 g/dL 7.0 - 7.6  Total Bilirubin 0.3 - 1.2 mg/dL 2.0(H) - 2.0(H)  Alkaline Phos 38 - 126 U/L 78 - 66  AST 15 - 41 U/L 15 - 25  ALT 0 - 44 U/L 18 - 13    Lab Results  Component Value Date   WBC 4.3 05/12/2021   HGB 11.9 (L) 05/12/2021   HCT 35.6 (L) 05/12/2021   MCV 86.8 05/12/2021   PLT 213 05/12/2021   NEUTROABS 2.5 05/12/2021    ASSESSMENT & PLAN:  Iron deficiency anemia due to chronic blood loss History of heavy menstrual bleeding along with urinary tract bleeding from chronic interstitial cystitis.   Treatment summary: IV iron given July 2017, May 2019, November 2019, November 2021 Hospitalization 04/13/2020-04/18/2020: Small bowel obstruction   Lab review:  11/05/2018: Hemoglobin 12.3, MCV 90.5 Iron studies: Ferritin 189, TIBC 255, iron saturation 27% 05/12/19: Hb 12.4, MCV: 92.6, Ferritin: 95, Iron sat: 31% 05/11/2020: Hemoglobin 9.9, MCV 87.2, iron saturation 6%, TIBC 366, ferritin 9 02/08/2021: Hemoglobin 10.8, MCV 89.2 05/12/2021: Hemoglobin 11.9   Bone marrow biopsy 02/08/2021: Hypocellular bone marrow with trilineage hematopoiesis, storage iron present, FISH and cytogenetics are Neg.  Her gynecologist has started her on some hormone therapies which  stopped her menstrual cycle.  She thinks that they will stop the hormone therapy medicines now and she is worried that menstrual cycles will become heavy once again.   Return to clinic in 3 months with labs and a telephone visit 3 days later to discuss results.   No orders of the defined types were placed in this encounter.  The patient has a good understanding of the overall plan. she agrees with it. she will call  with any problems that may develop before the next visit here.  Total time spent: 20 mins including face to face time and time spent for planning, charting and coordination of care  Rulon Eisenmenger, MD, MPH 05/15/2021  I, Thana Ates, am acting as scribe for Dr. Nicholas Lose.  I have reviewed the above documentation for accuracy and completeness, and I agree with the above.

## 2021-05-14 NOTE — Assessment & Plan Note (Signed)
History of heavy menstrual bleeding along with urinary tract bleeding from chronic interstitial cystitis.  Treatment summary: IV iron given July 2017, May 2019, November 2019, November 2021 Hospitalization 04/13/2020-04/18/2020: Small bowel obstruction  Lab review: 11/05/2018: Hemoglobin 12.3, MCV 90.5 Iron studies: Ferritin 189, TIBC 255, iron saturation 27% 05/12/19: Hb 12.4, MCV: 92.6, Ferritin: 95, Iron sat: 31% 05/11/2020: Hemoglobin 9.9, MCV 87.2, iron saturation 6%, TIBC 366, ferritin9 02/08/2021: Hemoglobin 10.8, MCV 89.2  Bone marrow biopsy 02/08/2021: Hypocellular bone marrow with trilineage hematopoiesis, storage iron present, FISH and cytogenetics are Neg.  Her gynecologist has started her on some hormone therapies which will stop her menstrual cycle for the next 3 months.  Return to clinic in 1 year for follow-up with labs

## 2021-05-15 ENCOUNTER — Inpatient Hospital Stay (HOSPITAL_BASED_OUTPATIENT_CLINIC_OR_DEPARTMENT_OTHER): Payer: Medicaid Other | Admitting: Hematology and Oncology

## 2021-05-15 ENCOUNTER — Other Ambulatory Visit: Payer: Medicaid Other

## 2021-05-15 ENCOUNTER — Other Ambulatory Visit: Payer: Self-pay

## 2021-05-15 DIAGNOSIS — D500 Iron deficiency anemia secondary to blood loss (chronic): Secondary | ICD-10-CM | POA: Diagnosis not present

## 2021-05-22 ENCOUNTER — Telehealth: Payer: Self-pay | Admitting: Neurology

## 2021-05-22 NOTE — Telephone Encounter (Signed)
LVM & sent mychart msg informing pt of reschedule needed for 11/22 appt due to MD out of office for the afternoon.

## 2021-05-23 ENCOUNTER — Institutional Professional Consult (permissible substitution): Payer: Medicaid Other | Admitting: Neurology

## 2021-06-03 NOTE — Progress Notes (Signed)
Chart reviewed, agree above plan ?

## 2021-06-21 ENCOUNTER — Other Ambulatory Visit: Payer: Self-pay | Admitting: Otolaryngology

## 2021-06-21 DIAGNOSIS — H93A1 Pulsatile tinnitus, right ear: Secondary | ICD-10-CM

## 2021-07-02 HISTORY — PX: INTERSTIM IMPLANT REMOVAL: SHX5131

## 2021-07-12 ENCOUNTER — Encounter: Payer: Self-pay | Admitting: Hematology and Oncology

## 2021-07-19 ENCOUNTER — Ambulatory Visit
Admission: RE | Admit: 2021-07-19 | Discharge: 2021-07-19 | Disposition: A | Discharge status: Home / Self Care | Payer: Medicaid Other | Source: Ambulatory Visit | Attending: Otolaryngology | Admitting: Otolaryngology

## 2021-07-19 DIAGNOSIS — H93A1 Pulsatile tinnitus, right ear: Secondary | ICD-10-CM

## 2021-07-19 MED ORDER — GADOBENATE DIMEGLUMINE 529 MG/ML IV SOLN
9.0000 mL | Freq: Once | INTRAVENOUS | Status: AC | PRN
  Administered 2021-07-19: 9 mL via INTRAVENOUS

## 2021-07-21 ENCOUNTER — Encounter: Payer: Self-pay | Admitting: Gastroenterology

## 2021-07-21 ENCOUNTER — Ambulatory Visit: Payer: Medicaid Other | Admitting: Gastroenterology

## 2021-07-21 VITALS — BP 96/72 | HR 89 | Ht 62.0 in | Wt 101.6 lb

## 2021-07-21 DIAGNOSIS — R197 Diarrhea, unspecified: Secondary | ICD-10-CM

## 2021-07-21 DIAGNOSIS — R110 Nausea: Secondary | ICD-10-CM

## 2021-07-21 NOTE — Progress Notes (Signed)
Review of pertinent gastrointestinal problems: 1. Necrotizing enterocolitis as infant, s/p major abd surgery, bowel resection.  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  On TPN until age 31 or 31.  Testing January 2019 as work-up for cystectomy for "end-stage interstitial cystitis" showed that she was doing fairly well nutritionally.  DEXA scan, small bowel follow-through, vitamin levels all appear normal I did recommend she continue multi vitamin daily including zinc, calcium, vitamin D and over-the-counter iron supplement every single day. 2. Constipation: large amount of stool in dilated colon 03/2013;  Colonoscopy 07/2013: tortuous colon, IC anastomosis was normal.  Cholestyramine trial 2015 helped. 3. Diarrhea workup 2017: stool path panel negative. 4.  Cholelithiasis. 5.  Small bowel obstruction October 2021: Felt likely adhesive related.  Spent 5 days in the hospital and eventually improved working today, but I could help tomorrow if you want to do it then 6.  Intermittent epigastric pains led to EGD November 2020.  Mild gastritis was found, negative for H. pylori.     HPI: This is a very pleasant 31 year old woman  I last saw her here in the office about 7 months ago, June 2022.  She has continued to have intermittent epigastric pains.  She has known gallstones in her gallbladder.  She had been told by surgeon previously that "the stones were small and probably not causing any issues".  I referred her back to general surgery to reconsider.  I was able to review his note through epic.  It seems like he was not certain if her epigastric pains were due to her gallstones but he certainly felt that she was such a high risk candidate for surgery given her multiple abdominal surgeries in the past that he recommended she take this up at a tertiary center.  Blood work November 2022 CBC was normal, complete metabolic profile was normal except for total bilirubin 1.4 [facility] hospital  labs  She is here today for a significant change in her bowel habits as well as constant nausea.  She notes in the past year or so that she started having significant frequency and loose stools.  She is having 4 bowel movements a day, always loose.  She has been nauseous all day.  This changed about a year ago or so.  Her weight is been overall stable.  She has seen minor rectal bleeding twice.  She was started on magnesium about a year ago that was doubled from once a day to twice a day even more recently.  She says her magnesium levels are low.   ROS: complete GI ROS as described in HPI, all other review negative.  Constitutional:  No unintentional weight loss   Past Medical History:  Diagnosis Date   Anemia    Anxiety    Arthritis    Bilateral hearing loss    Bladder mass    Chronic interstitial cystitis    Chronic interstitial cystitis with hematuria    2016   Chronic sinusitis    Fibromyalgia    GERD (gastroesophageal reflux disease)    Gross hematuria    History of Graves' disease    tx done in 2007   History of migraine    History of recurrent UTIs    Hypothyroidism    Iron deficiency anemia    due to SGS   Lower urinary tract symptoms (LUTS)    Lupus ([place])    rheumologist--  dr Abel Presto devashwer   Malabsorption syndrome    Migraine  Migraine    Passage of loose stools    chronic --  due to short gut syndrome   Pleuritis    S/P radioactive iodine thyroid ablation    2007   SGS (short gut syndrome)    Short gut syndrome    Sjogren's disease ([place])    Vitiligo    Wears glasses     Past Surgical History:  Procedure Laterality Date   BOWEL RESECTION  newborn   necrotizing enterocolitis (liver patched)   BRONCHOSCOPY  2014   CESAREAN SECTION  01/02/2012   Procedure: CESAREAN SECTION;  Surgeon: Lovenia Kim, MD;  Location: [facility];  Service: Gynecology;  Laterality: N/A;   COLONOSCOPY  07/29/2013   CYSTECTOMY W/ URETEROILEAL CONDUIT  10/2017    CYSTOSCOPY WITH BIOPSY N/A 11/23/2014   Procedure: CYSTOSCOPY WITH BLADDER  BIOPSY AND FULGERATION;  Surgeon: Irine Seal, MD;  Location: [facility];  Service: Urology;  Laterality: N/A;   INTERSTIM IMPLANT REMOVAL  07/2021   KNEE ARTHROSCOPY Right 2008   LIVER SURGERY     an infant   MULTIPLE EXTRACTIONS WITH ALVEOLOPLASTY N/A 03/24/2018   Procedure: EXTRACTIONS X 9;  Surgeon: Diona Browner, DDS;  Location: [facility];  Service: Oral Surgery;  Laterality: N/A;   SMALL INTESTINE SURGERY     an infant   WISDOM TOOTH EXTRACTION      Current Outpatient Medications  Medication Sig Dispense Refill   cetirizine (ZYRTEC) 5 MG tablet Take 5 mg by mouth daily.     clindamycin (CLEOCIN T) 1 % external solution Apply a thin layer to the face/chest/back every morning. Do not rinse off.     emtricitabine-tenofovir (TRUVADA) 200-300 MG tablet Take 1 tablet by mouth daily.     EPINEPHrine 0.3 mg/0.3 mL IJ SOAJ injection Inject 0.3 mg into the muscle as needed for anaphylaxis.     Erenumab-aooe (AIMOVIG) 70 MG/ML SOAJ Inject 70 mg into the skin at bedtime. 1 mL 11   Fluocinolone Acetonide Body 0.01 % OIL Apply to scalp daily as needed for scalp lesions/itching.     hydroxychloroquine (PLAQUENIL) 200 MG tablet Take 400 mg by mouth every morning.      hydroxypropyl methylcellulose / hypromellose (ISOPTO TEARS / GONIOVISC) 2.5 % ophthalmic solution Place 2 drops into both eyes 3 (three) times daily as needed for dry eyes.      levothyroxine (SYNTHROID) 100 MCG tablet Take 112 mcg by mouth daily.     mirtazapine (REMERON) 15 MG tablet Take 1 tablet by mouth daily.     omalizumab Arvid Right) 150 MG/ML prefilled syringe Inject 150 mg into the skin every 14 (fourteen) days.     omeprazole (PRILOSEC) 20 MG capsule Take 20 mg by mouth at bedtime.      ondansetron (ZOFRAN-ODT) 8 MG disintegrating tablet Take 1 tablet (8 mg total) by mouth every 8 (eight) hours as needed. 20 tablet 6    pantoprazole (PROTONIX) 40 MG tablet Take 1 tablet by mouth daily.     prazosin (MINIPRESS) 5 MG capsule Take 1 capsule by mouth in the morning and at bedtime.     predniSONE (DELTASONE) 5 MG tablet Take 1 tablet by mouth as needed.     propranolol ER (INDERAL LA) 80 MG 24 hr capsule Take 1 capsule (80 mg total) by mouth at bedtime. 30 capsule 11   SUMAtriptan (IMITREX) 6 MG/0.5ML SOLN injection Inject 0.5 mLs (6 mg total) into the skin every 2 (two) hours as needed  for migraine or headache. May repeat in 2 hours if headache persists or recurs. 6 mL 5   tiZANidine (ZANAFLEX) 4 MG tablet Take 1 tablet (4 mg total) by mouth every 8 (eight) hours as needed for muscle spasms. 30 tablet 3   traMADol (ULTRAM) 50 MG tablet Take 1 tablet by mouth every 6 (six) hours as needed.     White Petrolatum-Mineral Oil (WH PETROL-MINERAL OIL-LANOLIN) 0.1-0.1 % OINT Place 1 application into both eyes at bedtime.     AMBULATORY NON FORMULARY MEDICATION Iron Infusions every 3 months done at [facility]- ordered thru [facility] (Patient not taking: Reported on 07/21/2021)     No current facility-administered medications for this visit.    Allergies as of 07/21/2021 - Review Complete 07/21/2021  Allergen Reaction Noted   Penicillins Anaphylaxis 09/06/2011   Buprenorphine hcl Itching 09/06/2011   Morphine and related Itching 09/06/2011   Hydrocodone-acetaminophen Rash 05/21/2017    Family History  Problem Relation Age of Onset   Diabetes Father    Endometriosis Mother    Fibromyalgia Mother    Heart disease Maternal Grandmother    Hypertension Maternal Grandmother    Osteoporosis Maternal Grandmother    Heart disease Maternal Grandfather    Hypertension Maternal Grandfather    Prostate cancer Maternal Grandfather    Heart disease Paternal Grandmother    Other Neg Hx    Colon cancer Neg Hx    Pancreatic cancer Neg Hx    Stomach cancer Neg Hx     Social History   Socioeconomic History    Marital status: Divorced    Spouse name: Not on file   Number of children: 1   Years of education: college   Highest education level: Not on file  Occupational History   Occupation: N/A  Tobacco Use   Smoking status: Never   Smokeless tobacco: Never  Substance and Sexual Activity   Alcohol use: No    Alcohol/week: 0.0 standard drinks   Drug use: No   Sexual activity: Not on file  Other Topics Concern   Not on file  Social History Narrative   Lives at home with family.   Right-handed.   6 cups tea throughout the week.   Social Determinants of Health   Financial Resource Strain: Not on file  Food Insecurity: Not on file  Transportation Needs: Not on file  Physical Activity: Not on file  Stress: Not on file  Social Connections: Not on file  Intimate Partner Violence: Not on file     Physical Exam: BP 96/72    Pulse 89    Ht 5\' 2"  (1.575 m)    Wt 101 lb 9.6 oz (46.1 kg)    LMP 07/07/2021 (Approximate)    SpO2 97%    BMI 18.58 kg/m  Constitutional: generally well-appearing Psychiatric: alert and oriented x3 Abdomen: soft, nontender, nondistended, no obvious ascites, no peritoneal signs, normal bowel sounds No peripheral edema noted in lower extremities  Assessment and plan: 31 y.o. female with change in bowel habits, loose stools, frequency, constant nausea  It really seems like a lot of her GI symptoms correlate with starting oral magnesium oxide 400 mg pills.  She started taking 1 pill once daily and then more recently increased to 1 pill twice daily.  We discussed that the 1 side effect of magnesium is diarrhea and the #3 side effect of magnesium is nausea.  We agreed that before starting significant testing that she would try to cut back  or even is completely stop her magnesium supplement.  I told her she should cut back to once a day again for now and to call her primary care physician to ask permission to stop magnesium supplements completely for 2 months.  I would like  to see her back here in the office in 2 months after that drug free trial.  Please see the "Patient Instructions" section for addition details about the plan.  Owens Loffler, MD [facility] 07/21/2021, 10:10 AM   Total time on date of encounter was 25 minutes (this included time spent preparing to see the patient reviewing records; obtaining and/or reviewing separately obtained history; performing a medically appropriate exam and/or evaluation; counseling and educating the patient and family if present; ordering medications, tests or procedures if applicable; and documenting clinical information in the health record).

## 2021-07-21 NOTE — Patient Instructions (Addendum)
If you are age 33 or younger, your body mass index should be between 19-25. Your Body mass index is 18.58 kg/m. If this is out of the aformentioned range listed, please consider follow up with your Primary Care Provider.  _______________________________________________________  The [facility] providers would like to encourage you to use [facility] to communicate with providers for non-urgent requests or questions.  Due to long hold times on the telephone, sending your provider a message by [facility] may be a faster and more efficient way to get a response.  Please allow 48 business hours for a response.  Please remember that this is for non-urgent requests.  _______________________________________________________  DECREASE: Magnesium to one pill daily.  Please speak with primary care physician to ask permission to stop magnesium supplements completely for 2 months. Then follow up  in our office 2 months after that drug free trial. Appointment is scheduled for 09-25-21 at 10:50am.  Thank you for entrusting me with your care and choosing [facility].  Dr Ardis Hughs

## 2021-08-08 ENCOUNTER — Encounter (HOSPITAL_COMMUNITY): Payer: Self-pay

## 2021-08-08 ENCOUNTER — Emergency Department (HOSPITAL_COMMUNITY)
Admission: EM | Admit: 2021-08-08 | Discharge: 2021-08-08 | Disposition: A | Discharge status: Home / Self Care | Payer: Medicaid Other | Attending: Emergency Medicine | Admitting: Emergency Medicine

## 2021-08-08 ENCOUNTER — Other Ambulatory Visit: Payer: Self-pay

## 2021-08-08 DIAGNOSIS — N9489 Other specified conditions associated with female genital organs and menstrual cycle: Secondary | ICD-10-CM | POA: Diagnosis not present

## 2021-08-08 DIAGNOSIS — D649 Anemia, unspecified: Secondary | ICD-10-CM | POA: Diagnosis present

## 2021-08-08 LAB — CBC WITH DIFFERENTIAL/PLATELET
Abs Immature Granulocytes: 0.01 10*3/uL (ref 0.00–0.07)
Basophils Absolute: 0 10*3/uL (ref 0.0–0.1)
Basophils Relative: 0 %
Eosinophils Absolute: 0.1 10*3/uL (ref 0.0–0.5)
Eosinophils Relative: 1 %
HCT: 33.4 % — ABNORMAL LOW (ref 36.0–46.0)
Hemoglobin: 10.7 g/dL — ABNORMAL LOW (ref 12.0–15.0)
Immature Granulocytes: 0 %
Lymphocytes Relative: 43 %
Lymphs Abs: 1.9 10*3/uL (ref 0.7–4.0)
MCH: 29.2 pg (ref 26.0–34.0)
MCHC: 32 g/dL (ref 30.0–36.0)
MCV: 91.3 fL (ref 80.0–100.0)
Monocytes Absolute: 0.3 10*3/uL (ref 0.1–1.0)
Monocytes Relative: 6 %
Neutro Abs: 2.2 10*3/uL (ref 1.7–7.7)
Neutrophils Relative %: 50 %
Platelets: 182 10*3/uL (ref 150–400)
RBC: 3.66 MIL/uL — ABNORMAL LOW (ref 3.87–5.11)
RDW: 13.5 % (ref 11.5–15.5)
WBC: 4.4 10*3/uL (ref 4.0–10.5)
nRBC: 0 % (ref 0.0–0.2)

## 2021-08-08 LAB — COMPREHENSIVE METABOLIC PANEL
ALT: 13 U/L (ref 0–44)
AST: 19 U/L (ref 15–41)
Albumin: 3.4 g/dL — ABNORMAL LOW (ref 3.5–5.0)
Alkaline Phosphatase: 53 U/L (ref 38–126)
Anion gap: 7 (ref 5–15)
BUN: 8 mg/dL (ref 6–20)
CO2: 19 mmol/L — ABNORMAL LOW (ref 22–32)
Calcium: 8.7 mg/dL — ABNORMAL LOW (ref 8.9–10.3)
Chloride: 110 mmol/L (ref 98–111)
Creatinine, Ser: 0.91 mg/dL (ref 0.44–1.00)
GFR, Estimated: >60 mL/min (ref >60)
Glucose, Bld: 88 mg/dL (ref 70–99)
Potassium: 3.8 mmol/L (ref 3.5–5.1)
Sodium: 136 mmol/L (ref 135–145)
Total Bilirubin: 1.4 mg/dL — ABNORMAL HIGH (ref 0.3–1.2)
Total Protein: 6.6 g/dL (ref 6.5–8.1)

## 2021-08-08 LAB — URINALYSIS, ROUTINE W REFLEX MICROSCOPIC
Bilirubin Urine: NEGATIVE
Glucose, UA: NEGATIVE mg/dL
Hgb urine dipstick: NEGATIVE
Ketones, ur: NEGATIVE mg/dL
Nitrite: NEGATIVE
Protein, ur: 30 mg/dL — AB
Specific Gravity, Urine: 1.013 (ref 1.005–1.030)
WBC, UA: >50 WBC/hpf — ABNORMAL HIGH (ref 0–5)
pH: 6 (ref 5.0–8.0)

## 2021-08-08 LAB — TYPE AND SCREEN
ABO/RH(D): O POS
Antibody Screen: NEGATIVE

## 2021-08-08 LAB — HCG, QUANTITATIVE, PREGNANCY: hCG, Beta Chain, Quant, S: <1 m[IU]/mL (ref <5)

## 2021-08-08 LAB — LIPASE, BLOOD: Lipase: 46 U/L (ref 11–51)

## 2021-08-08 NOTE — ED Provider Notes (Signed)
[facility]-EMERGENCY DEPT Provider Note   CSN: [id number] Arrival date & time: 08/08/21  1959     History  Chief Complaint  Patient presents with   Abnormal Lab    [name] is a 31 y.o. female.  The history is provided by the patient and medical records.  Abnormal Lab  31 y.o. F with hx of graves disease, sjogrens disease, lupus, Fe+ deficiency anemia, short gut syndrome, migraine headaches, GERD, presenting to the ED from UC for reported low hemoglobin.  Recently finished menstrual cycle which is usually quite heavy.  States she does have periods of intermittent lightheadedness and fatigue, but that is common for her due to her multiple chronic medical issues.  She was started on Bactrim yesterday due to a little bit of blood in her urostomy, but reports this is also common for her.  She has not had any fevers.  Home Medications Prior to Admission medications   Medication Sig Start Date End Date Taking? Authorizing Provider  AMBULATORY NON FORMULARY MEDICATION Iron Infusions every 3 months done at [facility]- ordered thru [facility] Patient not taking: Reported on 07/21/2021    [provider]  cetirizine (ZYRTEC) 5 MG tablet Take 5 mg by mouth daily.    [provider]  clindamycin (CLEOCIN T) 1 % external solution Apply a thin layer to the face/chest/back every morning. Do not rinse off. 08/30/20   [provider]  emtricitabine-tenofovir (TRUVADA) 200-300 MG tablet Take 1 tablet by mouth daily. 11/23/20   [provider]  EPINEPHrine 0.3 mg/0.3 mL IJ SOAJ injection Inject 0.3 mg into the muscle as needed for anaphylaxis. 03/31/19   [provider]  Erenumab-aooe (AIMOVIG) 70 MG/ML SOAJ Inject 70 mg into the skin at bedtime. 08/11/20   Glean Salvo, NP  Fluocinolone Acetonide Body 0.01 % OIL Apply to scalp daily as needed for scalp lesions/itching. 08/30/20   [provider]  hydroxychloroquine  (PLAQUENIL) 200 MG tablet Take 400 mg by mouth every morning.     [provider]  hydroxypropyl methylcellulose / hypromellose (ISOPTO TEARS / GONIOVISC) 2.5 % ophthalmic solution Place 2 drops into both eyes 3 (three) times daily as needed for dry eyes.  04/27/19   [provider]  levothyroxine (SYNTHROID) 100 MCG tablet Take 112 mcg by mouth daily. 02/27/17   [provider]  mirtazapine (REMERON) 15 MG tablet Take 1 tablet by mouth daily. 11/22/20   [provider]  omalizumab [place]) 150 MG/ML prefilled syringe Inject 150 mg into the skin every 14 (fourteen) days.    [provider]  omeprazole (PRILOSEC) 20 MG capsule Take 20 mg by mouth at bedtime.     [provider]  ondansetron (ZOFRAN-ODT) 8 MG disintegrating tablet Take 1 tablet (8 mg total) by mouth every 8 (eight) hours as needed. 08/11/20   Glean Salvo, NP  pantoprazole (PROTONIX) 40 MG tablet Take 1 tablet by mouth daily. 11/23/20   [provider]  prazosin (MINIPRESS) 5 MG capsule Take 1 capsule by mouth in the morning and at bedtime. 11/23/20   [provider]  predniSONE (DELTASONE) 5 MG tablet Take 1 tablet by mouth as needed. 04/27/19   [provider]  propranolol ER (INDERAL LA) 80 MG 24 hr capsule Take 1 capsule (80 mg total) by mouth at bedtime. 08/11/20   Glean Salvo, NP  SUMAtriptan (IMITREX) 6 MG/0.5ML SOLN injection Inject 0.5 mLs (6 mg total) into the  skin every 2 (two) hours as needed for migraine or headache. May repeat in 2 hours if headache persists or recurs. 08/11/20   Suzzanne Cloud, NP  tiZANidine (ZANAFLEX) 4 MG tablet Take 1 tablet (4 mg total) by mouth every 8 (eight) hours as needed for muscle spasms. 08/11/20   Suzzanne Cloud, NP  traMADol (ULTRAM) 50 MG tablet Take 1 tablet by mouth every 6 (six) hours as needed. 11/22/20   [provider]  White Petrolatum-Mineral Oil ([place] PETROL-MINERAL OIL-LANOLIN) 0.1-0.1 % OINT Place  1 application into both eyes at bedtime. 04/28/19   [provider]      Allergies    Penicillins, Buprenorphine hcl, Morphine and related, and Hydrocodone-acetaminophen    Review of Systems   Review of Systems  Constitutional:        Abnormal labs  All other systems reviewed and are negative.  Physical Exam Updated Vital Signs BP 105/78 (BP Location: Right Arm)    Pulse 89    Temp 98.6 F (37 C) (Oral)    Resp 18    Ht 5\' 2"  (1.575 m)    Wt 46.3 kg    SpO2 97%    BMI 18.66 kg/m   Physical Exam Vitals and nursing note reviewed.  Constitutional:      Appearance: She is well-developed.  HENT:     Head: Normocephalic and atraumatic.  Eyes:     Conjunctiva/sclera: Conjunctivae normal.     Pupils: Pupils are equal, round, and reactive to light.  Cardiovascular:     Rate and Rhythm: Normal rate and regular rhythm.     Heart sounds: Normal heart sounds.  Pulmonary:     Effort: Pulmonary effort is normal.     Breath sounds: Normal breath sounds.  Abdominal:     General: Bowel sounds are normal.     Palpations: Abdomen is soft.  Genitourinary:    Comments: Urostomy in place Musculoskeletal:        General: Normal range of motion.     Cervical back: Normal range of motion.  Skin:    General: Skin is warm and dry.  Neurological:     Mental Status: She is alert and oriented to person, place, and time.    ED Results / Procedures / Treatments   Labs (all labs ordered are listed, but only abnormal results are displayed) Labs Reviewed  CBC WITH DIFFERENTIAL/PLATELET - Abnormal; Notable for the following components:      Result Value   RBC 3.66 (*)    Hemoglobin 10.7 (*)    HCT 33.4 (*)    All other components within normal limits  COMPREHENSIVE METABOLIC PANEL - Abnormal; Notable for the following components:   CO2 19 (*)    Calcium 8.7 (*)    Albumin 3.4 (*)    Total Bilirubin 1.4 (*)    All other components within normal limits  URINALYSIS, ROUTINE W REFLEX  MICROSCOPIC - Abnormal; Notable for the following components:   APPearance HAZY (*)    Protein, ur 30 (*)    Leukocytes,Ua LARGE (*)    WBC, UA >50 (*)    Bacteria, UA RARE (*)    Non Squamous Epithelial 0-5 (*)    All other components within normal limits  LIPASE, BLOOD  HCG, QUANTITATIVE, PREGNANCY  TYPE AND SCREEN    EKG None  Radiology No results found.  Procedures Procedures    Medications Ordered in ED Medications - No data to display  ED Course/  Medical Decision Making/ A&P                           Medical Decision Making Amount and/or Complexity of Data Reviewed External Data Reviewed: labs and notes. Labs: ordered. ECG/medicine tests: ordered and independent interpretation performed.   31 y.o. F here after reported abnormal labs yesterday at [facility].  Hemoglobin yesterday was 11.8, today 10.7-- this appear to be around her baseline when compared with prior values over the past few years.  Patient also admits these are her usual values.  She cannot tolerate oral iron tablets due to her malabsorption, has received iron infusions with hematology in the past but not since last year.  UA today with some bacteria, started on Bactrim yesterday and culture is in process.  She is afebrile, nontoxic, no leukocytosis.  No clinical signs of sepsis at this time.  She does not need emergent transfusion and I feel she is stable for discharge.  She was given copies of her labs today.  If symptoms should be worsening she can follow-up with hematology clinic or PCP for possible iron infusion again.   She may return here for new concerns.  Final Clinical Impression(s) / ED Diagnoses Final diagnoses:  Anemia, unspecified type    Rx / DC Orders ED Discharge Orders     None         Larene Pickett, PA-C 08/08/21 2242    Dorie Rank, MD 08/09/21 [phone]

## 2021-08-08 NOTE — ED Provider Triage Note (Signed)
Emergency Medicine Provider Triage Evaluation Note  [name] , a 31 y.o. female  was evaluated in triage.  Pt complains of low hemoglobin. Patient notes she was called by her PCP to report to the ED due to low hemoglobin. She admits to lightheadedness and fatigue. Recently finished her menstrual cycle which she notes is typically heavy.   Review of Systems  Positive: lightheadedness Negative: CP  Physical Exam  BP 105/78 (BP Location: Right Arm)    Pulse 89    Temp 98.6 F (37 C) (Oral)    Resp 18    Ht 5\' 2"  (1.575 m)    Wt 46.3 kg    SpO2 97%    BMI 18.66 kg/m  Gen:   Awake, no distress   Resp:  Normal effort  MSK:   Moves extremities without difficulty  Other:    Medical Decision Making  Medically screening exam initiated at 8:27 PM.  Appropriate orders placed.  was informed that the remainder of the evaluation will be completed by another provider, this initial triage assessment does not replace that evaluation, and the importance of remaining in the ED until their evaluation is complete.  Labs to recheck hemoglobin   [name], PA-C 08/08/21 2032

## 2021-08-08 NOTE — ED Triage Notes (Signed)
Pt states that she received a call from her provider about her blood count, they told her to come to the ED.

## 2021-08-08 NOTE — Discharge Instructions (Signed)
Your blood counts today were around your baseline, no need for transfusion. Follow-up with your primary care doctor. Return here for new concerns.

## 2021-08-14 ENCOUNTER — Other Ambulatory Visit: Payer: Self-pay

## 2021-08-14 ENCOUNTER — Ambulatory Visit: Payer: Medicaid Other | Admitting: Neurology

## 2021-08-14 ENCOUNTER — Inpatient Hospital Stay: Payer: Medicaid Other | Attending: Hematology and Oncology

## 2021-08-14 DIAGNOSIS — D500 Iron deficiency anemia secondary to blood loss (chronic): Secondary | ICD-10-CM | POA: Diagnosis not present

## 2021-08-14 LAB — CBC WITH DIFFERENTIAL (CANCER CENTER ONLY)
Abs Immature Granulocytes: 0.01 10*3/uL (ref 0.00–0.07)
Basophils Absolute: 0 10*3/uL (ref 0.0–0.1)
Basophils Relative: 0 %
Eosinophils Absolute: 0 10*3/uL (ref 0.0–0.5)
Eosinophils Relative: 1 %
HCT: 38.9 % (ref 36.0–46.0)
Hemoglobin: 12.8 g/dL (ref 12.0–15.0)
Immature Granulocytes: 0 %
Lymphocytes Relative: 37 %
Lymphs Abs: 1.4 10*3/uL (ref 0.7–4.0)
MCH: 29.4 pg (ref 26.0–34.0)
MCHC: 32.9 g/dL (ref 30.0–36.0)
MCV: 89.2 fL (ref 80.0–100.0)
Monocytes Absolute: 0.3 10*3/uL (ref 0.1–1.0)
Monocytes Relative: 6 %
Neutro Abs: 2.2 10*3/uL (ref 1.7–7.7)
Neutrophils Relative %: 56 %
Platelet Count: 213 10*3/uL (ref 150–400)
RBC: 4.36 MIL/uL (ref 3.87–5.11)
RDW: 13.7 % (ref 11.5–15.5)
WBC Count: 3.9 10*3/uL — ABNORMAL LOW (ref 4.0–10.5)
nRBC: 0 % (ref 0.0–0.2)

## 2021-08-14 LAB — IRON AND IRON BINDING CAPACITY (CC-WL,HP ONLY)
Iron: 67 ug/dL (ref 28–170)
Saturation Ratios: 17 % (ref 10.4–31.8)
TIBC: 389 ug/dL (ref 250–450)
UIBC: 322 ug/dL (ref 148–442)

## 2021-08-14 LAB — FERRITIN: Ferritin: 14 ng/mL (ref 11–307)

## 2021-08-15 NOTE — Assessment & Plan Note (Signed)
History of heavy menstrual bleeding along with urinary tract bleeding from chronic interstitial cystitis.  Treatment summary: IV iron given July 2017, May 2019, November 2019, November 2021 Hospitalization 04/13/2020-04/18/2020: Small bowel obstruction  Lab review: 11/05/2018: Hemoglobin 12.3, MCV 90.5 Iron studies: Ferritin 189, TIBC 255, iron saturation 27% 05/12/19: Hb 12.4, MCV: 92.6, Ferritin: 95, Iron sat: 31% 05/11/2020: Hemoglobin 9.9, MCV 87.2, iron saturation 6%, TIBC 366, ferritin9 02/08/2021: Hemoglobin 10.8, MCV 89.2 05/12/2021: Hemoglobin 11.9 08/16/21:   Bone marrow biopsy 02/08/2021: Hypocellular bone marrow with trilineage hematopoiesis, storage iron present, FISH and cytogenetics are Neg.  Her gynecologist has started her on some hormone therapies which  stopped her menstrual cycle.  She thinks that they will stop the hormone therapy medicines now and she is worried that menstrual cycles will become heavy once again.  Return to clinic in 3 months with labs and a telephone visit 3 days later to discuss results.

## 2021-08-15 NOTE — Progress Notes (Signed)
HEMATOLOGY-ONCOLOGY TELEPHONE VISIT PROGRESS NOTE  I connected with [name] on 08/16/2021 at  9:30 AM EST by telephone and verified that I am speaking with the correct person using two identifiers.  I discussed the limitations, risks, security and privacy concerns of performing an evaluation and management service by telephone and the availability of in person appointments.  I also discussed with the patient that there may be a patient responsible charge related to this service. The patient expressed understanding and agreed to proceed.   History of Present Illness: [name] is a 31 y.o. female with above-mentioned history of thrombocytopenia and anemia. Labs on 08/08/2021 showed Hg 12.8, HCT 38.9, PLT 213, Ferritin 14, iron sat 17. She presents via telephone today for follow-up. Continues to have fatigue. Last week she had an infection of kidneys.  She is doing better on antibiotics.  GYN bleeding has continued to persist.  She is seeing a gynecologist.      Assessment Plan:  Iron deficiency anemia due to chronic blood loss History of heavy menstrual bleeding along with urinary tract bleeding from chronic interstitial cystitis.   Treatment summary: IV iron given July 2017, May 2019, November 2019, November 2021 Hospitalization 04/13/2020-04/18/2020: Small bowel obstruction   Lab review:  11/05/2018: Hemoglobin 12.3, MCV 90.5 Iron studies: Ferritin 189, TIBC 255, iron saturation 27% 05/12/19: Hb 12.4, MCV: 92.6, Ferritin: 95, Iron sat: 31% 05/11/2020: Hemoglobin 9.9, MCV 87.2, iron saturation 6%, TIBC 366, ferritin 9 02/08/2021: Hemoglobin 10.8, MCV 89.2 05/12/2021: Hemoglobin 11.9 08/16/21:    Bone marrow biopsy 02/08/2021: Hypocellular bone marrow with trilineage hematopoiesis, storage iron present, FISH and cytogenetics are Neg.   Follows her GYN (bad one in Jan 2023)   Return to clinic in 1 month (march 2nd week) for labs and telephone visit after    I  discussed the assessment and treatment plan with the patient. The patient was provided an opportunity to ask questions and all were answered. The patient agreed with the plan and demonstrated an understanding of the instructions. The patient was advised to call back or seek an in-person evaluation if the symptoms worsen or if the condition fails to improve as anticipated.   Total time spent: 11 mins including non-face to face time and time spent for planning, charting and coordination of care  Rulon Eisenmenger, MD 08/16/2021    I, [name], am acting as scribe for Jamie Lose, MD.  I have reviewed the above documentation for accuracy and completeness, and I agree with the above.

## 2021-08-16 ENCOUNTER — Inpatient Hospital Stay (HOSPITAL_BASED_OUTPATIENT_CLINIC_OR_DEPARTMENT_OTHER): Payer: Medicaid Other | Admitting: Hematology and Oncology

## 2021-08-16 DIAGNOSIS — D500 Iron deficiency anemia secondary to blood loss (chronic): Secondary | ICD-10-CM | POA: Diagnosis not present

## 2021-08-23 ENCOUNTER — Ambulatory Visit: Payer: Medicaid Other | Admitting: Registered"

## 2021-09-13 ENCOUNTER — Other Ambulatory Visit: Payer: Self-pay

## 2021-09-13 ENCOUNTER — Inpatient Hospital Stay: Payer: Medicaid Other | Attending: Hematology and Oncology

## 2021-09-13 DIAGNOSIS — D500 Iron deficiency anemia secondary to blood loss (chronic): Secondary | ICD-10-CM | POA: Diagnosis not present

## 2021-09-13 LAB — CBC WITH DIFFERENTIAL (CANCER CENTER ONLY)
Abs Immature Granulocytes: 0.01 10*3/uL (ref 0.00–0.07)
Basophils Absolute: 0 10*3/uL (ref 0.0–0.1)
Basophils Relative: 0 %
Eosinophils Absolute: 0.1 10*3/uL (ref 0.0–0.5)
Eosinophils Relative: 1 %
HCT: 36.3 % (ref 36.0–46.0)
Hemoglobin: 11.9 g/dL — ABNORMAL LOW (ref 12.0–15.0)
Immature Granulocytes: 0 %
Lymphocytes Relative: 36 %
Lymphs Abs: 2 10*3/uL (ref 0.7–4.0)
MCH: 29.2 pg (ref 26.0–34.0)
MCHC: 32.8 g/dL (ref 30.0–36.0)
MCV: 89 fL (ref 80.0–100.0)
Monocytes Absolute: 0.3 10*3/uL (ref 0.1–1.0)
Monocytes Relative: 6 %
Neutro Abs: 3.1 10*3/uL (ref 1.7–7.7)
Neutrophils Relative %: 57 %
Platelet Count: 239 10*3/uL (ref 150–400)
RBC: 4.08 MIL/uL (ref 3.87–5.11)
RDW: 13.9 % (ref 11.5–15.5)
WBC Count: 5.4 10*3/uL (ref 4.0–10.5)
nRBC: 0 % (ref 0.0–0.2)

## 2021-09-13 LAB — IRON AND IRON BINDING CAPACITY (CC-WL,HP ONLY)
Iron: 38 ug/dL (ref 28–170)
Saturation Ratios: 9 % — ABNORMAL LOW (ref 10.4–31.8)
TIBC: 414 ug/dL (ref 250–450)
UIBC: 376 ug/dL (ref 148–442)

## 2021-09-13 LAB — FERRITIN: Ferritin: 11 ng/mL (ref 11–307)

## 2021-09-13 NOTE — Progress Notes (Signed)
HEMATOLOGY-ONCOLOGY TELEPHONE VISIT PROGRESS NOTE ? ?I connected with [name] on 09/15/21 at  8:15 AM EDT by telephone and verified that I am speaking with the correct person using two identifiers.  ?I discussed the limitations, risks, security and privacy concerns of performing an evaluation and management service by telephone and the availability of in person appointments.  ?I also discussed with the patient that there may be a patient responsible charge related to this service. The patient expressed understanding and agreed to proceed.  ? ?CHIEF COMPLIANT: Follow-up of iron deficiency anemia ? ?History of Present Illness: [name] is a 31 y.o. female with above-mentioned history of thrombocytopenia and anemia. Labs on 08/08/2021 showed Hg 12.8, HCT 38.9, PLT 213, Ferritin 14, iron sat 17. She presents via telephone today for follow-up. Still having heavy cycles.  She recently had a heavy menstrual cycle and is craving for ice chips at this time. ? ?REVIEW OF SYSTEMS:   ?Constitutional: Denies fevers, chills or abnormal weight loss ?All other systems were reviewed with the patient and are negative. ? ?Observations/Objective:  ?  ?Assessment Plan:  ?Iron deficiency anemia due to chronic blood loss ?History of heavy menstrual bleeding along with urinary tract bleeding from chronic interstitial cystitis. ?  ?Treatment summary: IV iron given July 2017, May 2019, November 2019, November 2021 ?Hospitalization 04/13/2020-04/18/2020: Small bowel obstruction ?  ?Lab review:  ?11/05/2018: Hemoglobin 12.3, MCV 90.5 Iron studies: Ferritin 189, TIBC 255, iron saturation 27% ?05/12/19: Hb 12.4, MCV: 92.6, Ferritin: 95, Iron sat: 31% ?05/11/2020: Hemoglobin 9.9, MCV 87.2, iron saturation 6%, TIBC 366, ferritin 9 ?02/08/2021: Hemoglobin 10.8, MCV 89.2 ?05/12/2021: Hemoglobin 11.9 ? 09/13/21: Hemoglobin 11.9, MCV 89, iron saturation 9%, ferritin 11, platelets 239 ? ?I discussed the labs with the patient and felt that  she is stable and iron deficiency standpoint. ?  ?Bone marrow biopsy 02/08/2021: Hypocellular bone marrow with trilineage hematopoiesis, storage iron present, FISH and cytogenetics are Neg. ?  ?Follows her GYN ?She just had her menstrual cycle and therefore she thinks that the ice chip cravings are all related to that.  She is hoping that she would get better and therefore would not need IV iron at this time.  She will call us in about a month and inform us of her decision. ?Follow-up: If she gets IV iron then I would see her in 6 months from that. ?If she does not get IV iron then we might have to recheck her labs in about 4 months. ? ? ? ?I discussed the assessment and treatment plan with the patient. The patient was provided an opportunity to ask questions and all were answered. The patient agreed with the plan and demonstrated an understanding of the instructions. The patient was advised to call back or seek an in-person evaluation if the symptoms worsen or if the condition fails to improve as anticipated.  ? ?I provided 12 minutes of non-face-to-face time during this encounter. ? Harriette Ohara, MD   ? ?Earlie Server, am acting as a Education administrator for Dr. Lindi Adie  ?

## 2021-09-15 ENCOUNTER — Inpatient Hospital Stay (HOSPITAL_BASED_OUTPATIENT_CLINIC_OR_DEPARTMENT_OTHER): Payer: Medicaid Other | Admitting: Hematology and Oncology

## 2021-09-15 DIAGNOSIS — D500 Iron deficiency anemia secondary to blood loss (chronic): Secondary | ICD-10-CM

## 2021-09-15 NOTE — Assessment & Plan Note (Addendum)
History of heavy menstrual bleeding along with urinary tract bleeding from chronic interstitial cystitis. ?? ?Treatment summary: IV iron given July 2017, May 2019, November 2019, November 2021 ?Hospitalization 04/13/2020-04/18/2020: Small bowel obstruction ?? ?Lab review:? ?11/05/2018: Hemoglobin 12.3, MCV 90.5 Iron studies: Ferritin 189, TIBC 255, iron saturation 27% ?05/12/19: Hb 12.4, MCV: 92.6, Ferritin: 95, Iron sat: 31% ?05/11/2020: Hemoglobin 9.9, MCV 87.2, iron saturation 6%, TIBC 366, ferritin?9 ?02/08/2021: Hemoglobin 10.8, MCV 89.2 ?05/12/2021: Hemoglobin 11.9 ? 09/13/21: Hemoglobin 11.9, MCV 89, iron saturation 9%, ferritin 11 ? ?I discussed the labs with the patient and felt that she is stable and iron deficiency standpoint. ?? ?Bone marrow biopsy 02/08/2021: Hypocellular bone marrow with trilineage hematopoiesis, storage iron present, FISH and cytogenetics are?Neg. ?? ?Follows her GYN ?Return to clinic in 6 months with labs and follow-up after with a telephone visit ?

## 2021-09-25 ENCOUNTER — Ambulatory Visit (INDEPENDENT_AMBULATORY_CARE_PROVIDER_SITE_OTHER): Payer: Medicaid Other | Admitting: Gastroenterology

## 2021-09-25 ENCOUNTER — Encounter: Payer: Self-pay | Admitting: Gastroenterology

## 2021-09-25 VITALS — BP 104/74 | HR 100 | Resp 98 | Ht 61.5 in | Wt 100.0 lb

## 2021-09-25 DIAGNOSIS — R195 Other fecal abnormalities: Secondary | ICD-10-CM

## 2021-09-25 MED ORDER — CHOLESTYRAMINE 4 GM/DOSE PO POWD: 4.0000 g | Freq: Every day | ORAL | 2 refills | Status: AC

## 2021-09-25 NOTE — Patient Instructions (Addendum)
If you are age 33 or younger, your body mass index should be between 19-25. Your Body mass index is 18.59 kg/m?Marland Kitchen If this is out of the aformentioned range listed, please consider follow up with your Primary Care Provider.  ?________________________________________________________ ? ?The [place] [facility] providers would like to encourage you to use [facility] to communicate with providers for non-urgent requests or questions.  Due to long hold times on the telephone, sending your provider a message by [facility] may be a faster and more efficient way to get a response.  Please allow 48 business hours for a response.  Please remember that this is for non-urgent requests.  ?_______________________________________________________ ? ?We have sent the following medications to your pharmacy for you to pick up at your convenience: ? ?START: cholestyramine 4grams daily ? ?You are scheduled to follow up on 11-28-21 at 10:50am. ? ?Thank you for entrusting me with your care and choosing [facility]. ? ?Dr Ardis Hughs ? ?

## 2021-09-25 NOTE — Progress Notes (Signed)
Review of pertinent gastrointestinal problems: ?1. Necrotizing enterocolitis as infant, s/p major abd surgery, bowel resection.  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  On TPN until age 31 or 28.  Testing January 2019 as work-up for cystectomy for "end-stage interstitial cystitis" showed that she was doing fairly well nutritionally.  DEXA scan, small bowel follow-through, vitamin levels all appear normal I did recommend she continue multi vitamin daily including zinc, calcium, vitamin D and over-the-counter iron supplement every single day. ?2. Constipation: large amount of stool in dilated colon 03/2013;  Colonoscopy 07/2013: tortuous colon, IC anastomosis was normal.  Cholestyramine trial 2015 helped. ?3. Diarrhea workup 2017: stool path panel negative. ?4.  Cholelithiasis. ?5.  Small bowel obstruction October 2021: Felt likely adhesive related.  Spent 5 days in the hospital and eventually improved working today, but I could help tomorrow if you want to do it then ?6.  Intermittent epigastric pains led to EGD November 2020.  Mild gastritis was found, negative for H. pylori.  ? atypical gallstone pain ? ? ? ?HPI: ?This is a very pleasant 31 year old woman ? ? ?I last saw her here in the office about 2 months ago.  She was having a lot of issues with her bowels loose stools frequency and nausea.  A lot of her symptoms seem to correlate with starting oral magnesium pill once or twice daily.  I recommended she completely stop taking that medicine. ? ?Her weight was 100 pounds today, 1 pound down from 2 months ago visit ? ?She completely stopped magnesium and it did not make any difference in her chronic daily loose stools.  She has loose stool about 4 times a day associate with some cramping and also bloating. ? ?She has intermittent sometimes chronic for days epigastric pain.  She is going to be seeing a Psychologist, sport and exercise through the [facility] facility to consider elective cholecystectomy.  She does have gallstones.   Surgeons here locally have balked because of her complex abdominal surgery history ? ? ?ROS: complete GI ROS as described in HPI, all other review negative. ? ?Constitutional:  No unintentional weight loss ? ? ?Past Medical History:  ?Diagnosis Date  ? Anemia   ? Anxiety   ? Arthritis   ? Bilateral hearing loss   ? Bladder mass   ? Chronic interstitial cystitis   ? Chronic interstitial cystitis with hematuria   ? 2016  ? Chronic sinusitis   ? Fibromyalgia   ? GERD (gastroesophageal reflux disease)   ? Gross hematuria   ? History of Graves' disease   ? tx done in 2007  ? History of migraine   ? History of recurrent UTIs   ? Hypothyroidism   ? Iron deficiency anemia   ? due to SGS  ? Lower urinary tract symptoms (LUTS)   ? Lupus ([place])   ? rheumologist--  dr Abel Presto devashwer  ? Malabsorption syndrome   ? Migraine   ? Migraine   ? Passage of loose stools   ? chronic --  due to short gut syndrome  ? Pleuritis   ? S/P radioactive iodine thyroid ablation   ? 2007  ? SGS (short gut syndrome)   ? Short gut syndrome   ? Sjogren's disease ([place])   ? Vitiligo   ? Wears glasses   ? ? ?Past Surgical History:  ?Procedure Laterality Date  ? BOWEL RESECTION  newborn  ? necrotizing enterocolitis (liver patched)  ? BRONCHOSCOPY  2014  ? CESAREAN SECTION  01/02/2012  ?  Procedure: CESAREAN SECTION;  Surgeon: Lovenia Kim, MD;  Location: [facility];  Service: Gynecology;  Laterality: N/A;  ? COLONOSCOPY  07/29/2013  ? CYSTECTOMY W/ URETEROILEAL CONDUIT  10/2017  ? CYSTOSCOPY WITH BIOPSY N/A 11/23/2014  ? Procedure: CYSTOSCOPY WITH BLADDER  BIOPSY AND FULGERATION;  Surgeon: Irine Seal, MD;  Location: [facility];  Service: Urology;  Laterality: N/A;  ? INTERSTIM IMPLANT REMOVAL  07/2021  ? KNEE ARTHROSCOPY Right 2008  ? LIVER SURGERY    ? an infant  ? MULTIPLE EXTRACTIONS WITH ALVEOLOPLASTY N/A 03/24/2018  ? Procedure: EXTRACTIONS X 9;  Surgeon: Diona Browner, DDS;  Location: [place];  Service: Oral  Surgery;  Laterality: N/A;  ? SMALL INTESTINE SURGERY    ? an infant  ? WISDOM TOOTH EXTRACTION    ? ? ?Current Outpatient Medications  ?Medication Instructions  ? Aimovig 70 mg, Subcutaneous, Nightly  ? AMBULATORY NON FORMULARY MEDICATION Iron Infusions every 3 months done at [facility]- ordered thru [facility]   ? cetirizine (ZYRTEC) 5 mg, Oral, Daily  ? clindamycin (CLEOCIN T) 1 % external solution Apply a thin layer to the face/chest/back every morning. Do not rinse off.  ? emtricitabine-tenofovir (TRUVADA) 200-300 MG tablet 1 tablet, Oral, Daily  ? EPINEPHrine (EPI-PEN) 0.3 mg, Intramuscular, As needed  ? Fluocinolone Acetonide Body 0.01 % OIL Apply to scalp daily as needed for scalp lesions/itching.  ? hydroxychloroquine (PLAQUENIL) 400 mg, Oral, Every morning  ? hydroxypropyl methylcellulose / hypromellose (ISOPTO TEARS / GONIOVISC) 2.5 % ophthalmic solution 2 drops, Both Eyes, 3 times daily PRN  ? levothyroxine (SYNTHROID) 112 mcg, Oral, Daily  ? mirtazapine (REMERON) 15 MG tablet 1 tablet, Oral, Daily  ? omalizumab (XOLAIR) 150 mg, Subcutaneous, Every 14 days  ? omeprazole (PRILOSEC) 20 mg, Oral, Daily at bedtime  ? ondansetron (ZOFRAN-ODT) 8 mg, Oral, Every 8 hours PRN  ? pantoprazole (PROTONIX) 40 MG tablet 1 tablet, Oral, Daily  ? prazosin (MINIPRESS) 5 MG capsule 1 capsule, Oral, 2 times daily  ? predniSONE (DELTASONE) 5 MG tablet 1 tablet, Oral, As needed  ? propranolol ER (INDERAL LA) 80 mg, Oral, Daily at bedtime  ? SUMAtriptan (IMITREX) 6 mg, Subcutaneous, Every 2 hours PRN, May repeat in 2 hours if headache persists or recurs.  ? tiZANidine (ZANAFLEX) 4 mg, Oral, Every 8 hours PRN  ? traMADol (ULTRAM) 50 MG tablet 1 tablet, Oral, Every 6 hours PRN  ? White Petrolatum-Mineral Oil ([place] PETROL-MINERAL OIL-LANOLIN) 99991111 % OINT 1 application., Both Eyes, Daily at bedtime  ? ? ?Allergies as of 09/25/2021 - Review Complete 09/25/2021  ?Allergen Reaction Noted  ? Penicillins Anaphylaxis  09/06/2011  ? Buprenorphine hcl Itching 09/06/2011  ? Morphine and related Itching 09/06/2011  ? Hydrocodone-acetaminophen Rash 05/21/2017  ? ? ?Family History  ?Problem Relation Age of Onset  ? Diabetes Father   ? Endometriosis Mother   ? Fibromyalgia Mother   ? Heart disease Maternal Grandmother   ? Hypertension Maternal Grandmother   ? Osteoporosis Maternal Grandmother   ? Heart disease Maternal Grandfather   ? Hypertension Maternal Grandfather   ? Prostate cancer Maternal Grandfather   ? Heart disease Paternal Grandmother   ? Other Neg Hx   ? Colon cancer Neg Hx   ? Pancreatic cancer Neg Hx   ? Stomach cancer Neg Hx   ? ? ?Social History  ? ?Socioeconomic History  ? Marital status: Divorced  ?  Spouse name: Not on file  ? Number  of children: 1  ? Years of education: college  ? Highest education level: Not on file  ?Occupational History  ? Occupation: N/A  ?Tobacco Use  ? Smoking status: Never  ? Smokeless tobacco: Never  ?Vaping Use  ? Vaping Use: Never used  ?Substance and Sexual Activity  ? Alcohol use: No  ?  Alcohol/week: 0.0 standard drinks  ? Drug use: No  ? Sexual activity: Not on file  ?Other Topics Concern  ? Not on file  ?Social History Narrative  ? Lives at home with family.  ? Right-handed.  ? 6 cups tea throughout the week.  ? ?Social Determinants of Health  ? ?Financial Resource Strain: Not on file  ?Food Insecurity: Not on file  ?Transportation Needs: Not on file  ?Physical Activity: Not on file  ?Stress: Not on file  ?Social Connections: Not on file  ?Intimate Partner Violence: Not on file  ? ? ? ?Physical Exam: ?BP 104/74   Pulse 100   Resp (!) 98   Ht 5' 1.5" (1.562 m)   Wt 100 lb (45.4 kg)   BMI 18.59 kg/m?  ?Constitutional: generally well-appearing ?Psychiatric: alert and oriented x3 ?Abdomen: soft, nontender, nondistended, no obvious ascites, no peritoneal signs, normal bowel sounds ?No peripheral edema noted in lower extremities ? ?Assessment and plan: ?31 y.o. female with  cholelithiasis, chronic loose stools in the setting of chronic short-bowel ? ?She will be seeing a surgeon at weight to consider elective cholecystectomy.   ? ?I recommended another trial of cholestyramine powder for her chronic loo

## 2021-10-03 ENCOUNTER — Encounter: Payer: Self-pay | Admitting: Family Medicine

## 2021-10-03 ENCOUNTER — Ambulatory Visit (INDEPENDENT_AMBULATORY_CARE_PROVIDER_SITE_OTHER): Payer: Medicaid Other | Admitting: Family Medicine

## 2021-10-03 ENCOUNTER — Ambulatory Visit: Payer: Self-pay

## 2021-10-03 VITALS — BP 90/64 | Ht 61.5 in | Wt 100.0 lb

## 2021-10-03 DIAGNOSIS — M25571 Pain in right ankle and joints of right foot: Secondary | ICD-10-CM | POA: Diagnosis present

## 2021-10-03 NOTE — Patient Instructions (Signed)
Good to see you ?Please try the exercises  ?Please use the insoles  ?Please slowly wean off of the crutches  ?Please use tylenol as needed  ?Please send me a message in MyChart with any questions or updates.  ?Please see me back in 3 weeks or as needed if better.  ? ?--Dr. Jordan Likes ? ?

## 2021-10-03 NOTE — Assessment & Plan Note (Signed)
Acutely occurring.  Pain initiated few days ago.  No structural change appreciated on exam today.  No significant swelling.  Possible for subtalar joint involvement given pain with weightbearing ?-Counseled on home exercise therapy and supportive care. ?-Green sport insoles. ?-Counseled on Tylenol. ?-Counseled on using crutches and weaning off. ?-Could consider further imaging or physical therapy. ?

## 2021-10-03 NOTE — Progress Notes (Signed)
?[name] - 31 y.o. female MRN [id number]  Date of birth: 1990-09-08 ? ?SUBJECTIVE:  Including CC & ROS.  ?No chief complaint on file. ? ? ?[name] is a 31 y.o. female that is presenting with acute right foot and ankle pain.  The pain is occurring over the medial hindfoot.  She initially felt a pop in the lateral hindfoot.  She is having pain with weightbearing.  Has been taking Tylenol.  No history of similar pain.  No significant swelling. ? ? ? ?Review of Systems ?See HPI  ? ?HISTORY: Past Medical, Surgical, Social, and Family History Reviewed & Updated per EMR.   ?Pertinent Historical Findings include: ? ?Past Medical History:  ?Diagnosis Date  ? Anemia   ? Anxiety   ? Arthritis   ? Bilateral hearing loss   ? Bladder mass   ? Chronic interstitial cystitis   ? Chronic interstitial cystitis with hematuria   ? 2016  ? Chronic sinusitis   ? Fibromyalgia   ? GERD (gastroesophageal reflux disease)   ? Gross hematuria   ? History of Graves' disease   ? tx done in 2007  ? History of migraine   ? History of recurrent UTIs   ? Hypothyroidism   ? Iron deficiency anemia   ? due to SGS  ? Lower urinary tract symptoms (LUTS)   ? Lupus ([facility])   ? rheumologist--  dr Janalyn Rouse devashwer  ? Malabsorption syndrome   ? Migraine   ? Migraine   ? Passage of loose stools   ? chronic --  due to short gut syndrome  ? Pleuritis   ? S/P radioactive iodine thyroid ablation   ? 2007  ? SGS (short gut syndrome)   ? Short gut syndrome   ? Sjogren's disease ([facility])   ? Vitiligo   ? Wears glasses   ? ? ?Past Surgical History:  ?Procedure Laterality Date  ? BOWEL RESECTION  newborn  ? necrotizing enterocolitis (liver patched)  ? BRONCHOSCOPY  2014  ? CESAREAN SECTION  01/02/2012  ? Procedure: CESAREAN SECTION;  Surgeon: Lenoard Aden, MD;  Location: [facility];  Service: Gynecology;  Laterality: N/A;  ? COLONOSCOPY  07/29/2013  ? CYSTECTOMY W/ URETEROILEAL CONDUIT  10/2017  ? CYSTOSCOPY WITH BIOPSY N/A 11/23/2014  ? Procedure:  CYSTOSCOPY WITH BLADDER  BIOPSY AND FULGERATION;  Surgeon: Bjorn Pippin, MD;  Location: [facility];  Service: Urology;  Laterality: N/A;  ? INTERSTIM IMPLANT REMOVAL  07/2021  ? KNEE ARTHROSCOPY Right 2008  ? LIVER SURGERY    ? an infant  ? MULTIPLE EXTRACTIONS WITH ALVEOLOPLASTY N/A 03/24/2018  ? Procedure: EXTRACTIONS X 9;  Surgeon: Ocie Doyne, DDS;  Location: [facility];  Service: Oral Surgery;  Laterality: N/A;  ? SMALL INTESTINE SURGERY    ? an infant  ? WISDOM TOOTH EXTRACTION    ? ? ? ?PHYSICAL EXAM:  ?VS: BP 90/64 (BP Location: Left Arm, Patient Position: Sitting)   Ht 5' 1.5" (1.562 m)   Wt 100 lb (45.4 kg)   BMI 18.59 kg/m?  ?Physical Exam ?Gen: NAD, alert, cooperative with exam, well-appearing ?MSK:  ?Neurovascularly intact   ? ?Limited ultrasound: Right foot and ankle: ? ?No effusion within the ankle joint. ?Normal-appearing posterior tibialis. ?Normal-appearing peroneal tendons. ?Normal-appearing Achilles. ? ?Summary: No structural changes appreciated ? ?Ultrasound and interpretation by Clare Gandy, MD ? ? ? ?ASSESSMENT & PLAN:  ? ?Acute right ankle pain ?Acutely occurring.  Pain initiated few  days ago.  No structural change appreciated on exam today.  No significant swelling.  Possible for subtalar joint involvement given pain with weightbearing ?-Counseled on home exercise therapy and supportive care. ?-Green sport insoles. ?-Counseled on Tylenol. ?-Counseled on using crutches and weaning off. ?-Could consider further imaging or physical therapy. ? ? ? ? ?

## 2021-10-17 ENCOUNTER — Encounter: Payer: Self-pay | Admitting: Hematology and Oncology

## 2021-10-18 ENCOUNTER — Other Ambulatory Visit: Payer: Self-pay | Admitting: Hematology and Oncology

## 2021-10-18 NOTE — Progress Notes (Signed)
IV iron with Venofer 200 mg daily for 5 days ?Premeds: Solu-Medrol, Benadryl, Pepcid with each treatment ?

## 2021-10-19 ENCOUNTER — Telehealth: Payer: Self-pay | Admitting: Hematology and Oncology

## 2021-10-19 NOTE — Telephone Encounter (Signed)
.  Called patient to schedule appointment per 4/19 inbasket, patient is aware of date and time.   ?

## 2021-10-23 ENCOUNTER — Inpatient Hospital Stay: Payer: Medicaid Other | Attending: Hematology and Oncology

## 2021-10-23 VITALS — BP 105/79 | HR 73 | Temp 98.1°F | Resp 16

## 2021-10-23 DIAGNOSIS — D500 Iron deficiency anemia secondary to blood loss (chronic): Secondary | ICD-10-CM | POA: Insufficient documentation

## 2021-10-23 DIAGNOSIS — Z79899 Other long term (current) drug therapy: Secondary | ICD-10-CM | POA: Diagnosis not present

## 2021-10-23 MED ORDER — FAMOTIDINE IN NACL 20-0.9 MG/50ML-% IV SOLN
20.0000 mg | Freq: Once | INTRAVENOUS | Status: AC
  Administered 2021-10-23: 20 mg via INTRAVENOUS
  Filled 2021-10-23: qty 50

## 2021-10-23 MED ORDER — METHYLPREDNISOLONE SODIUM SUCC 125 MG IJ SOLR
60.0000 mg | Freq: Once | INTRAMUSCULAR | Status: AC
  Administered 2021-10-23: 60 mg via INTRAVENOUS
  Filled 2021-10-23: qty 2

## 2021-10-23 MED ORDER — SODIUM CHLORIDE 0.9 % IV SOLN: Freq: Once | INTRAVENOUS | Status: AC

## 2021-10-23 MED ORDER — SODIUM CHLORIDE 0.9 % IV SOLN
200.0000 mg | Freq: Once | INTRAVENOUS | Status: AC
  Administered 2021-10-23: 200 mg via INTRAVENOUS
  Filled 2021-10-23: qty 200

## 2021-10-23 MED ORDER — DIPHENHYDRAMINE HCL 50 MG/ML IJ SOLN
25.0000 mg | Freq: Once | INTRAMUSCULAR | Status: AC
  Administered 2021-10-23: 25 mg via INTRAVENOUS
  Filled 2021-10-23: qty 1

## 2021-10-23 NOTE — Progress Notes (Signed)
Per Dr. Pamelia Hoit, ok to decrease Solumedrol dose to 60 mg as premed for Iron Sucrose.  Pt received this as a premed dose in the past. ? ?Ebony Hail, Pharm.D., CPP ?10/23/2021@3 :53 PM ? ? ?

## 2021-10-23 NOTE — Patient Instructions (Signed)

## 2021-10-24 ENCOUNTER — Inpatient Hospital Stay: Payer: Medicaid Other

## 2021-10-24 ENCOUNTER — Telehealth: Payer: Self-pay | Admitting: Hematology and Oncology

## 2021-10-24 NOTE — Telephone Encounter (Signed)
.  Called patient to schedule appointment per 4/25 inbasket, patient is aware of date and time.   ?

## 2021-10-25 ENCOUNTER — Inpatient Hospital Stay: Payer: Medicaid Other

## 2021-10-26 ENCOUNTER — Inpatient Hospital Stay: Payer: Medicaid Other

## 2021-10-26 VITALS — BP 106/76 | HR 85 | Temp 98.3°F | Resp 17

## 2021-10-26 DIAGNOSIS — D500 Iron deficiency anemia secondary to blood loss (chronic): Secondary | ICD-10-CM

## 2021-10-26 MED ORDER — FAMOTIDINE IN NACL 20-0.9 MG/50ML-% IV SOLN
20.0000 mg | Freq: Once | INTRAVENOUS | Status: AC
  Administered 2021-10-26: 20 mg via INTRAVENOUS
  Filled 2021-10-26: qty 50

## 2021-10-26 MED ORDER — METHYLPREDNISOLONE SODIUM SUCC 125 MG IJ SOLR
60.0000 mg | Freq: Once | INTRAMUSCULAR | Status: AC
  Administered 2021-10-26: 60 mg via INTRAVENOUS
  Filled 2021-10-26: qty 2

## 2021-10-26 MED ORDER — SODIUM CHLORIDE 0.9 % IV SOLN
200.0000 mg | Freq: Once | INTRAVENOUS | Status: AC
  Administered 2021-10-26: 200 mg via INTRAVENOUS
  Filled 2021-10-26: qty 200

## 2021-10-26 MED ORDER — SODIUM CHLORIDE 0.9 % IV SOLN: Freq: Once | INTRAVENOUS | Status: AC

## 2021-10-26 MED ORDER — DIPHENHYDRAMINE HCL 50 MG/ML IJ SOLN
25.0000 mg | Freq: Once | INTRAMUSCULAR | Status: AC
  Administered 2021-10-26: 25 mg via INTRAVENOUS
  Filled 2021-10-26: qty 1

## 2021-10-26 NOTE — Patient Instructions (Signed)

## 2021-10-27 ENCOUNTER — Inpatient Hospital Stay: Payer: Medicaid Other

## 2021-10-30 ENCOUNTER — Inpatient Hospital Stay: Payer: Medicaid Other | Attending: Hematology and Oncology

## 2021-10-30 VITALS — BP 120/86 | HR 79 | Temp 98.5°F | Resp 18

## 2021-10-30 DIAGNOSIS — Z79899 Other long term (current) drug therapy: Secondary | ICD-10-CM | POA: Insufficient documentation

## 2021-10-30 DIAGNOSIS — D500 Iron deficiency anemia secondary to blood loss (chronic): Secondary | ICD-10-CM | POA: Insufficient documentation

## 2021-10-30 MED ORDER — FAMOTIDINE IN NACL 20-0.9 MG/50ML-% IV SOLN
20.0000 mg | Freq: Once | INTRAVENOUS | Status: AC
  Administered 2021-10-30: 20 mg via INTRAVENOUS
  Filled 2021-10-30: qty 50

## 2021-10-30 MED ORDER — SODIUM CHLORIDE 0.9 % IV SOLN: Freq: Once | INTRAVENOUS | Status: DC | PRN

## 2021-10-30 MED ORDER — EPINEPHRINE 0.3 MG/0.3ML IJ SOAJ: 0.3000 mg | Freq: Once | INTRAMUSCULAR | Status: DC | PRN

## 2021-10-30 MED ORDER — ALBUTEROL SULFATE (2.5 MG/3ML) 0.083% IN NEBU: 2.5000 mg | INHALATION_SOLUTION | Freq: Once | RESPIRATORY_TRACT | Status: DC | PRN

## 2021-10-30 MED ORDER — HEPARIN SOD (PORK) LOCK FLUSH 100 UNIT/ML IV SOLN: 500.0000 [IU] | Freq: Once | INTRAVENOUS | Status: DC | PRN

## 2021-10-30 MED ORDER — ALTEPLASE 2 MG IJ SOLR: 2.0000 mg | Freq: Once | INTRAMUSCULAR | Status: DC | PRN

## 2021-10-30 MED ORDER — FAMOTIDINE IN NACL 20-0.9 MG/50ML-% IV SOLN: 20.0000 mg | Freq: Once | INTRAVENOUS | Status: DC | PRN

## 2021-10-30 MED ORDER — HEPARIN SOD (PORK) LOCK FLUSH 100 UNIT/ML IV SOLN: 250.0000 [IU] | Freq: Once | INTRAVENOUS | Status: DC | PRN

## 2021-10-30 MED ORDER — SODIUM CHLORIDE 0.9 % IV SOLN: Freq: Once | INTRAVENOUS | Status: AC

## 2021-10-30 MED ORDER — METHYLPREDNISOLONE SODIUM SUCC 125 MG IJ SOLR
60.0000 mg | Freq: Once | INTRAMUSCULAR | Status: AC
  Administered 2021-10-30: 60 mg via INTRAVENOUS
  Filled 2021-10-30: qty 2

## 2021-10-30 MED ORDER — DIPHENHYDRAMINE HCL 50 MG/ML IJ SOLN: 50.0000 mg | Freq: Once | INTRAMUSCULAR | Status: DC | PRN

## 2021-10-30 MED ORDER — SODIUM CHLORIDE 0.9 % IV SOLN
200.0000 mg | Freq: Once | INTRAVENOUS | Status: AC
  Administered 2021-10-30: 200 mg via INTRAVENOUS
  Filled 2021-10-30: qty 200

## 2021-10-30 MED ORDER — SODIUM CHLORIDE 0.9% FLUSH: 3.0000 mL | Freq: Once | INTRAVENOUS | Status: DC | PRN

## 2021-10-30 MED ORDER — SODIUM CHLORIDE 0.9% FLUSH: 10.0000 mL | Freq: Once | INTRAVENOUS | Status: DC | PRN

## 2021-10-30 MED ORDER — METHYLPREDNISOLONE SODIUM SUCC 125 MG IJ SOLR: 125.0000 mg | Freq: Once | INTRAMUSCULAR | Status: DC | PRN

## 2021-10-30 MED ORDER — DIPHENHYDRAMINE HCL 50 MG/ML IJ SOLN
25.0000 mg | Freq: Once | INTRAMUSCULAR | Status: AC
  Administered 2021-10-30: 25 mg via INTRAVENOUS
  Filled 2021-10-30: qty 1

## 2021-10-30 NOTE — Patient Instructions (Signed)

## 2021-11-02 ENCOUNTER — Inpatient Hospital Stay: Payer: Medicaid Other

## 2021-11-02 ENCOUNTER — Other Ambulatory Visit: Payer: Self-pay

## 2021-11-02 VITALS — BP 125/83 | HR 90 | Temp 98.2°F | Resp 18

## 2021-11-02 DIAGNOSIS — D500 Iron deficiency anemia secondary to blood loss (chronic): Secondary | ICD-10-CM

## 2021-11-02 MED ORDER — FAMOTIDINE IN NACL 20-0.9 MG/50ML-% IV SOLN
20.0000 mg | Freq: Once | INTRAVENOUS | Status: AC
  Administered 2021-11-02: 20 mg via INTRAVENOUS
  Filled 2021-11-02: qty 50

## 2021-11-02 MED ORDER — SODIUM CHLORIDE 0.9 % IV SOLN
200.0000 mg | Freq: Once | INTRAVENOUS | Status: AC
  Administered 2021-11-02: 200 mg via INTRAVENOUS
  Filled 2021-11-02: qty 200

## 2021-11-02 MED ORDER — SODIUM CHLORIDE 0.9 % IV SOLN: Freq: Once | INTRAVENOUS | Status: AC

## 2021-11-02 MED ORDER — DIPHENHYDRAMINE HCL 50 MG/ML IJ SOLN
25.0000 mg | Freq: Once | INTRAMUSCULAR | Status: AC
  Administered 2021-11-02: 25 mg via INTRAVENOUS
  Filled 2021-11-02: qty 1

## 2021-11-02 MED ORDER — METHYLPREDNISOLONE SODIUM SUCC 125 MG IJ SOLR
60.0000 mg | Freq: Once | INTRAMUSCULAR | Status: AC
  Administered 2021-11-02: 60 mg via INTRAVENOUS
  Filled 2021-11-02: qty 2

## 2021-11-06 ENCOUNTER — Inpatient Hospital Stay (HOSPITAL_BASED_OUTPATIENT_CLINIC_OR_DEPARTMENT_OTHER): Payer: Medicaid Other

## 2021-11-06 ENCOUNTER — Other Ambulatory Visit: Payer: Self-pay

## 2021-11-06 VITALS — BP 106/77 | HR 77 | Temp 98.6°F | Resp 18

## 2021-11-06 DIAGNOSIS — D500 Iron deficiency anemia secondary to blood loss (chronic): Secondary | ICD-10-CM | POA: Diagnosis not present

## 2021-11-06 MED ORDER — DIPHENHYDRAMINE HCL 50 MG/ML IJ SOLN
25.0000 mg | Freq: Once | INTRAMUSCULAR | Status: AC
  Administered 2021-11-06: 25 mg via INTRAVENOUS
  Filled 2021-11-06: qty 1

## 2021-11-06 MED ORDER — FAMOTIDINE IN NACL 20-0.9 MG/50ML-% IV SOLN
20.0000 mg | Freq: Once | INTRAVENOUS | Status: AC
  Administered 2021-11-06: 20 mg via INTRAVENOUS
  Filled 2021-11-06: qty 50

## 2021-11-06 MED ORDER — METHYLPREDNISOLONE SODIUM SUCC 125 MG IJ SOLR
60.0000 mg | Freq: Once | INTRAMUSCULAR | Status: AC
  Administered 2021-11-06: 60 mg via INTRAVENOUS
  Filled 2021-11-06: qty 2

## 2021-11-06 MED ORDER — SODIUM CHLORIDE 0.9 % IV SOLN: Freq: Once | INTRAVENOUS | Status: AC

## 2021-11-06 MED ORDER — SODIUM CHLORIDE 0.9 % IV SOLN
200.0000 mg | Freq: Once | INTRAVENOUS | Status: AC
  Administered 2021-11-06: 200 mg via INTRAVENOUS
  Filled 2021-11-06: qty 200

## 2021-11-06 NOTE — Patient Instructions (Signed)

## 2021-11-28 ENCOUNTER — Ambulatory Visit: Payer: Medicaid Other | Admitting: Gastroenterology

## 2021-12-04 ENCOUNTER — Telehealth: Payer: Self-pay | Admitting: Hematology and Oncology

## 2021-12-04 NOTE — Telephone Encounter (Signed)
Rescheduled appointment per provider PAL. Left message. 

## 2021-12-08 ENCOUNTER — Inpatient Hospital Stay: Payer: Medicaid Other

## 2021-12-22 ENCOUNTER — Inpatient Hospital Stay: Payer: Medicaid Other

## 2021-12-25 ENCOUNTER — Inpatient Hospital Stay: Payer: Medicaid Other | Admitting: Hematology and Oncology

## 2022-01-01 NOTE — Progress Notes (Signed)
HEMATOLOGY-ONCOLOGY TELEPHONE VISIT PROGRESS NOTE  I connected with [name] on 01/15/22 at  9:30 AM EDT by telephone and verified that I am speaking with the correct person using two identifiers.  I discussed the limitations, risks, security and privacy concerns of performing an evaluation and management service by telephone and the availability of in person appointments.  I also discussed with the patient that there may be a patient responsible charge related to this service. The patient expressed understanding and agreed to proceed.   CHIEF COMPLIANT: Follow-up of iron deficiency anemia  History of Present Illness:[name] is a 31 y.o. female with above-mentioned history of thrombocytopenia and anemia. She presents to the clinic today via telephone follow-up.  She received IV iron therapy in April and May 2023 and seem to have tolerated 5 treatment dose regimen much better.  Did not experience any major side effects with IV iron.  She however has not felt any differently since iron infusion.    REVIEW OF SYSTEMS:   Constitutional: Denies fevers, chills or abnormal weight loss   All other systems were reviewed with the patient and are negative. Observations/Objective:     Assessment Plan:  Iron deficiency anemia due to chronic blood loss History of heavy menstrual bleeding along with urinary tract bleeding from chronic interstitial cystitis.   Treatment summary: IV iron given July 2017, May 2019, November 2019, November 2021, April 2023  Hospitalization 04/13/2020-04/18/2020: Small bowel obstruction   Lab review:  11/05/2018: Hemoglobin 12.3, MCV 90.5 Iron studies: Ferritin 189, TIBC 255, iron saturation 27% 05/12/19: Hb 12.4, MCV: 92.6, Ferritin: 95, Iron sat: 31% 05/11/2020: Hemoglobin 9.9, MCV 87.2, iron saturation 6%, TIBC 366, ferritin 9 02/08/2021: Hemoglobin 10.8, MCV 89.2 05/12/2021: Hemoglobin 11.9  09/13/21: Hemoglobin 11.9, MCV 89, iron saturation 9%, ferritin 11,  platelets 239 01/12/2022: Hemoglobin 11.4, MCV 89.6, iron saturation 32%, ferritin 203  Excellent response to IV iron therapy given in April and May 2023. Recheck labs in 6 months and telephone visit after that.   I discussed the assessment and treatment plan with the patient. The patient was provided an opportunity to ask questions and all were answered. The patient agreed with the plan and demonstrated an understanding of the instructions. The patient was advised to call back or seek an in-person evaluation if the symptoms worsen or if the condition fails to improve as anticipated.   I provided 11 minutes of non-face-to-face time during this encounter. Tamsen Meek, MD    I Janan Ridge am scribing for Dr. Pamelia Hoit  I have reviewed the above documentation for accuracy and completeness, and I agree with the above.

## 2022-01-11 ENCOUNTER — Other Ambulatory Visit: Payer: Self-pay | Admitting: *Deleted

## 2022-01-11 DIAGNOSIS — D500 Iron deficiency anemia secondary to blood loss (chronic): Secondary | ICD-10-CM

## 2022-01-12 ENCOUNTER — Other Ambulatory Visit: Payer: Self-pay

## 2022-01-12 ENCOUNTER — Inpatient Hospital Stay: Payer: Medicaid Other

## 2022-01-12 ENCOUNTER — Inpatient Hospital Stay: Payer: Medicaid Other | Attending: Hematology and Oncology

## 2022-01-12 DIAGNOSIS — D500 Iron deficiency anemia secondary to blood loss (chronic): Secondary | ICD-10-CM | POA: Insufficient documentation

## 2022-01-12 LAB — CBC WITH DIFFERENTIAL (CANCER CENTER ONLY)
Abs Immature Granulocytes: 0.04 10*3/uL (ref 0.00–0.07)
Basophils Absolute: 0 10*3/uL (ref 0.0–0.1)
Basophils Relative: 0 %
Eosinophils Absolute: 0.1 10*3/uL (ref 0.0–0.5)
Eosinophils Relative: 1 %
HCT: 34.5 % — ABNORMAL LOW (ref 36.0–46.0)
Hemoglobin: 11.4 g/dL — ABNORMAL LOW (ref 12.0–15.0)
Immature Granulocytes: 1 %
Lymphocytes Relative: 27 %
Lymphs Abs: 1.8 10*3/uL (ref 0.7–4.0)
MCH: 29.6 pg (ref 26.0–34.0)
MCHC: 33 g/dL (ref 30.0–36.0)
MCV: 89.6 fL (ref 80.0–100.0)
Monocytes Absolute: 0.3 10*3/uL (ref 0.1–1.0)
Monocytes Relative: 4 %
Neutro Abs: 4.5 10*3/uL (ref 1.7–7.7)
Neutrophils Relative %: 67 %
Platelet Count: 213 10*3/uL (ref 150–400)
RBC: 3.85 MIL/uL — ABNORMAL LOW (ref 3.87–5.11)
RDW: 14.3 % (ref 11.5–15.5)
WBC Count: 6.8 10*3/uL (ref 4.0–10.5)
nRBC: 0 % (ref 0.0–0.2)

## 2022-01-12 LAB — FERRITIN: Ferritin: 203 ng/mL (ref 11–307)

## 2022-01-12 LAB — IRON AND IRON BINDING CAPACITY (CC-WL,HP ONLY)
Iron: 88 ug/dL (ref 28–170)
Saturation Ratios: 32 % — ABNORMAL HIGH (ref 10.4–31.8)
TIBC: 272 ug/dL (ref 250–450)
UIBC: 184 ug/dL (ref 148–442)

## 2022-01-15 ENCOUNTER — Inpatient Hospital Stay (HOSPITAL_BASED_OUTPATIENT_CLINIC_OR_DEPARTMENT_OTHER): Payer: Medicaid Other | Admitting: Hematology and Oncology

## 2022-01-15 DIAGNOSIS — D500 Iron deficiency anemia secondary to blood loss (chronic): Secondary | ICD-10-CM | POA: Diagnosis not present

## 2022-01-15 NOTE — Assessment & Plan Note (Signed)
History of heavy menstrual bleeding along with urinary tract bleeding from chronic interstitial cystitis.  Treatment summary: IV iron given July 2017, May 2019, November 2019, November 2021, April 2023  Hospitalization 04/13/2020-04/18/2020: Small bowel obstruction  Lab review: 11/05/2018: Hemoglobin 12.3, MCV 90.5 Iron studies: Ferritin 189, TIBC 255, iron saturation 27% 05/12/19: Hb 12.4, MCV: 92.6, Ferritin: 95, Iron sat: 31% 05/11/2020: Hemoglobin 9.9, MCV 87.2, iron saturation 6%, TIBC 366, ferritin9 02/08/2021: Hemoglobin 10.8, MCV 89.2 05/12/2021: Hemoglobin 11.9 09/13/21: Hemoglobin 11.9, MCV 89, iron saturation 9%, ferritin 11, platelets 239 01/12/2022: Hemoglobin 11.4, MCV 89.6, iron saturation 32%, ferritin 203  Excellent response to IV iron therapy given in April and May 2023. Recheck labs in 4 months and telephone visit after the

## 2022-01-16 ENCOUNTER — Telehealth: Payer: Self-pay | Admitting: Hematology and Oncology

## 2022-01-16 NOTE — Telephone Encounter (Signed)
Scheduled appointment per 7/17 los. Patient is aware. 

## 2022-05-04 ENCOUNTER — Encounter: Payer: Self-pay | Admitting: Hematology and Oncology

## 2022-05-04 ENCOUNTER — Other Ambulatory Visit: Payer: Self-pay

## 2022-05-04 DIAGNOSIS — D500 Iron deficiency anemia secondary to blood loss (chronic): Secondary | ICD-10-CM

## 2022-05-07 ENCOUNTER — Inpatient Hospital Stay: Payer: Medicaid Other | Attending: Hematology and Oncology

## 2022-05-07 DIAGNOSIS — D500 Iron deficiency anemia secondary to blood loss (chronic): Secondary | ICD-10-CM | POA: Insufficient documentation

## 2022-05-07 DIAGNOSIS — Z79899 Other long term (current) drug therapy: Secondary | ICD-10-CM | POA: Diagnosis not present

## 2022-05-07 DIAGNOSIS — Z79890 Hormone replacement therapy (postmenopausal): Secondary | ICD-10-CM | POA: Insufficient documentation

## 2022-05-07 LAB — CBC WITH DIFFERENTIAL (CANCER CENTER ONLY)
Abs Immature Granulocytes: 0.01 10*3/uL (ref 0.00–0.07)
Basophils Absolute: 0 10*3/uL (ref 0.0–0.1)
Basophils Relative: 0 %
Eosinophils Absolute: 0 10*3/uL (ref 0.0–0.5)
Eosinophils Relative: 1 %
HCT: 36.3 % (ref 36.0–46.0)
Hemoglobin: 12 g/dL (ref 12.0–15.0)
Immature Granulocytes: 0 %
Lymphocytes Relative: 34 %
Lymphs Abs: 1.3 10*3/uL (ref 0.7–4.0)
MCH: 29.9 pg (ref 26.0–34.0)
MCHC: 33.1 g/dL (ref 30.0–36.0)
MCV: 90.5 fL (ref 80.0–100.0)
Monocytes Absolute: 0.3 10*3/uL (ref 0.1–1.0)
Monocytes Relative: 8 %
Neutro Abs: 2.2 10*3/uL (ref 1.7–7.7)
Neutrophils Relative %: 57 %
Platelet Count: 195 10*3/uL (ref 150–400)
RBC: 4.01 MIL/uL (ref 3.87–5.11)
RDW: 13.2 % (ref 11.5–15.5)
WBC Count: 3.9 10*3/uL — ABNORMAL LOW (ref 4.0–10.5)
nRBC: 0 % (ref 0.0–0.2)

## 2022-05-07 LAB — IRON AND IRON BINDING CAPACITY (CC-WL,HP ONLY)
Iron: 130 ug/dL (ref 28–170)
Saturation Ratios: 48 % — ABNORMAL HIGH (ref 10.4–31.8)
TIBC: 269 ug/dL (ref 250–450)
UIBC: 139 ug/dL — ABNORMAL LOW (ref 148–442)

## 2022-05-07 LAB — FERRITIN: Ferritin: 135 ng/mL (ref 11–307)

## 2022-05-08 ENCOUNTER — Telehealth: Payer: Self-pay | Admitting: Hematology and Oncology

## 2022-05-08 NOTE — Telephone Encounter (Signed)
Scheduled appointment per 11/7 staff message. Patient is aware.

## 2022-05-09 ENCOUNTER — Telehealth: Payer: Self-pay | Admitting: Hematology and Oncology

## 2022-05-09 NOTE — Telephone Encounter (Signed)
Rescheduled appointment per room/resource and 11/8 secure chat. Left voicemail

## 2022-05-09 NOTE — Progress Notes (Signed)
Patient Care Team: Bernita Buffy as PCP - General (Physician Assistant)  DIAGNOSIS: No diagnosis found.  SUMMARY OF ONCOLOGIC HISTORY: Oncology History   No history exists.    CHIEF COMPLIANT:   INTERVAL HISTORY: TAZARIA DLUGOSZ is a   ALLERGIES:  is allergic to penicillins, buprenorphine hcl, morphine and related, and hydrocodone-acetaminophen.  MEDICATIONS:  Current Outpatient Medications  Medication Sig Dispense Refill   AMBULATORY NON FORMULARY MEDICATION Iron Infusions every 3 months done at [facility]- ordered thru [facility]     cetirizine (ZYRTEC) 5 MG tablet Take 5 mg by mouth daily.     cholestyramine (QUESTRAN) 4 GM/DOSE powder Take 1 packet (4 g total) by mouth daily. 30 packet 2   clindamycin (CLEOCIN T) 1 % external solution Apply a thin layer to the face/chest/back every morning. Do not rinse off.     emtricitabine-tenofovir (TRUVADA) 200-300 MG tablet Take 1 tablet by mouth daily.     EPINEPHrine 0.3 mg/0.3 mL IJ SOAJ injection Inject 0.3 mg into the muscle as needed for anaphylaxis.     Erenumab-aooe (AIMOVIG) 70 MG/ML SOAJ Inject 70 mg into the skin at bedtime. 1 mL 11   Fluocinolone Acetonide Body 0.01 % OIL Apply to scalp daily as needed for scalp lesions/itching.     hydroxychloroquine (PLAQUENIL) 200 MG tablet Take 400 mg by mouth every morning.      hydroxypropyl methylcellulose / hypromellose (ISOPTO TEARS / GONIOVISC) 2.5 % ophthalmic solution Place 2 drops into both eyes 3 (three) times daily as needed for dry eyes.      levothyroxine (SYNTHROID) 100 MCG tablet Take 112 mcg by mouth daily.     mirtazapine (REMERON) 15 MG tablet Take 1 tablet by mouth daily.     omalizumab Geoffry Paradise) 150 MG/ML prefilled syringe Inject 150 mg into the skin every 14 (fourteen) days.     omeprazole (PRILOSEC) 20 MG capsule Take 20 mg by mouth at bedtime.      ondansetron (ZOFRAN-ODT) 8 MG disintegrating tablet Take 1 tablet (8 mg total) by mouth  every 8 (eight) hours as needed. 20 tablet 6   pantoprazole (PROTONIX) 40 MG tablet Take 1 tablet by mouth daily.     prazosin (MINIPRESS) 5 MG capsule Take 1 capsule by mouth in the morning and at bedtime.     predniSONE (DELTASONE) 5 MG tablet Take 1 tablet by mouth as needed.     propranolol ER (INDERAL LA) 80 MG 24 hr capsule Take 1 capsule (80 mg total) by mouth at bedtime. 30 capsule 11   SUMAtriptan (IMITREX) 6 MG/0.5ML SOLN injection Inject 0.5 mLs (6 mg total) into the skin every 2 (two) hours as needed for migraine or headache. May repeat in 2 hours if headache persists or recurs. 6 mL 5   tiZANidine (ZANAFLEX) 4 MG tablet Take 1 tablet (4 mg total) by mouth every 8 (eight) hours as needed for muscle spasms. 30 tablet 3   traMADol (ULTRAM) 50 MG tablet Take 1 tablet by mouth every 6 (six) hours as needed.     White Petrolatum-Mineral Oil (WH PETROL-MINERAL OIL-LANOLIN) 0.1-0.1 % OINT Place 1 application into both eyes at bedtime.     No current facility-administered medications for this visit.    PHYSICAL EXAMINATION: ECOG PERFORMANCE STATUS: {CHL ONC ECOG PS:[id number]}  There were no vitals filed for this visit. There were no vitals filed for this visit.  BREAST:*** No palpable masses or nodules in either right or left breasts.  No palpable axillary supraclavicular or infraclavicular adenopathy no breast tenderness or nipple discharge. (exam performed in the presence of a chaperone)  LABORATORY DATA:  I have reviewed the data as listed    Latest Ref Rng & Units 08/08/2021    8:27 PM 05/12/2021   10:02 AM 09/23/2020    9:17 AM  CMP  Glucose 70 - 99 mg/dL 88  88  82   BUN 6 - 20 mg/dL 8  8  11    Creatinine 0.44 - 1.00 mg/dL  2.01  0.07   Sodium 135 - 145 mmol/L 136  139  136   Potassium 3.5 - 5.1 mmol/L 3.8  3.6  4.6   Chloride 98 - 111 mmol/L 110  115  108   CO2 22 - 32 mmol/L 19  16  22    Calcium 8.9 - 10.3 mg/dL 8.7  8.6  9.0   Total Protein 6.5 - 8.1 g/dL 6.6  7.0     Total Bilirubin 0.3 - 1.2 mg/dL 1.4  2.0    Alkaline Phos 38 - 126 U/L 53  78    AST 15 - 41 U/L 19  15    ALT 0 - 44 U/L 13  18      Lab Results  Component Value Date   WBC 3.9 (L) 05/07/2022   HGB 12.0 05/07/2022   HCT 36.3 05/07/2022   MCV 90.5 05/07/2022   PLT 195 05/07/2022   NEUTROABS 2.2 05/07/2022    ASSESSMENT & PLAN:  No problem-specific Assessment & Plan notes found for this encounter.    No orders of the defined types were placed in this encounter.  The patient has a good understanding of the overall plan. she agrees with it. she will call with any problems that may develop before the next visit here. Total time spent: 30 mins including face to face time and time spent for planning, charting and co-ordination of care   13/11/2021, CMA 05/09/22    I Sherlyn Lick am scribing for Dr. 13/08/23  ***

## 2022-05-10 ENCOUNTER — Inpatient Hospital Stay (HOSPITAL_BASED_OUTPATIENT_CLINIC_OR_DEPARTMENT_OTHER): Payer: Medicaid Other | Admitting: Hematology and Oncology

## 2022-05-10 DIAGNOSIS — D500 Iron deficiency anemia secondary to blood loss (chronic): Secondary | ICD-10-CM | POA: Diagnosis not present

## 2022-05-10 NOTE — Assessment & Plan Note (Signed)
History of heavy menstrual bleeding along with urinary tract bleeding from chronic interstitial cystitis.   Treatment summary: IV iron given July 2017, May 2019, November 2019, November 2021, April 2023   Hospitalization 04/13/2020-04/18/2020: Small bowel obstruction   Lab review:  11/05/2018: Hemoglobin 12.3, MCV 90.5 Iron studies: Ferritin 189, TIBC 255, iron saturation 27% 05/12/19: Hb 12.4, MCV: 92.6, Ferritin: 95, Iron sat: 31% 05/11/2020: Hemoglobin 9.9, MCV 87.2, iron saturation 6%, TIBC 366, ferritin 9 02/08/2021: Hemoglobin 10.8, MCV 89.2 05/12/2021: Hemoglobin 11.9  09/13/21: Hemoglobin 11.9, MCV 89, iron saturation 9%, ferritin 11, platelets 239 01/12/2022: Hemoglobin 11.4, MCV 89.6, iron saturation 32%, ferritin 203 05/07/2022: Hemoglobin 12, MCV 90.5, ferritin 203, iron saturation 48%  Labs in 6 months and follow-up 2 days later with a telephone visit

## 2022-05-30 ENCOUNTER — Ambulatory Visit: Payer: Self-pay

## 2022-05-30 ENCOUNTER — Ambulatory Visit (INDEPENDENT_AMBULATORY_CARE_PROVIDER_SITE_OTHER): Payer: Medicaid Other | Admitting: Family Medicine

## 2022-05-30 ENCOUNTER — Encounter: Payer: Self-pay | Admitting: Family Medicine

## 2022-05-30 VITALS — BP 100/62 | Ht 61.0 in | Wt 96.0 lb

## 2022-05-30 DIAGNOSIS — S63501D Unspecified sprain of right wrist, subsequent encounter: Secondary | ICD-10-CM | POA: Insufficient documentation

## 2022-05-30 DIAGNOSIS — S63501A Unspecified sprain of right wrist, initial encounter: Secondary | ICD-10-CM | POA: Diagnosis present

## 2022-05-30 DIAGNOSIS — G8929 Other chronic pain: Secondary | ICD-10-CM

## 2022-05-30 NOTE — Patient Instructions (Signed)
Good to see you Please use ice as needed  Please try the brace at night or with bowling. . Please try the exercises  Please try over the counter voltaren gel   Please send me a message in MyChart with any questions or updates.  Please see me back in 4-6 weeks .   --Dr. Jordan Likes

## 2022-05-30 NOTE — Assessment & Plan Note (Signed)
Acutely occurring. Mild effusion within the carpal joints.  - counseled on home exercise therapy and supportive care - brace - counseled on voltaren  - could consider injection or imaging

## 2022-05-30 NOTE — Progress Notes (Signed)
[name] - 31 y.o. female MRN [id number]  Date of birth: [birth year]  SUBJECTIVE:  Including CC & ROS.  No chief complaint on file.   [name] is a 31 y.o. female that is  presenting with right wrist pain. Pain at night and with bowling. No injury. No prior surgery.    Review of Systems See HPI   HISTORY: Past Medical, Surgical, Social, and Family History Reviewed & Updated per EMR.   Pertinent Historical Findings include:  Past Medical History:  Diagnosis Date   Anemia    Anxiety    Arthritis    Bilateral hearing loss    Bladder mass    Chronic interstitial cystitis    Chronic interstitial cystitis with hematuria    2016   Chronic sinusitis    Fibromyalgia    GERD (gastroesophageal reflux disease)    Gross hematuria    History of Graves' disease    tx done in 2007   History of migraine    History of recurrent UTIs    Hypothyroidism    Iron deficiency anemia    due to SGS   Lower urinary tract symptoms (LUTS)    Lupus (HCC)    rheumologist--  dr Janalyn Rouse devashwer   Malabsorption syndrome    Migraine    Migraine    Passage of loose stools    chronic --  due to short gut syndrome   Pleuritis    S/P radioactive iodine thyroid ablation    2007   SGS (short gut syndrome)    Short gut syndrome    Sjogren's disease (HCC)    Vitiligo    Wears glasses     Past Surgical History:  Procedure Laterality Date   BOWEL RESECTION  newborn   necrotizing enterocolitis (liver patched)   BRONCHOSCOPY  2014   CESAREAN SECTION  01/02/2012   Procedure: CESAREAN SECTION;  Surgeon: Lenoard Aden, MD;  Location: [facility];  Service: Gynecology;  Laterality: N/A;   COLONOSCOPY  07/29/2013   CYSTECTOMY W/ URETEROILEAL CONDUIT  10/2017   CYSTOSCOPY WITH BIOPSY N/A 11/23/2014   Procedure: CYSTOSCOPY WITH BLADDER  BIOPSY AND FULGERATION;  Surgeon: Bjorn Pippin, MD;  Location: [facility];  Service: Urology;  Laterality: N/A;   INTERSTIM IMPLANT  REMOVAL  07/2021   KNEE ARTHROSCOPY Right 2008   LIVER SURGERY     an infant   MULTIPLE EXTRACTIONS WITH ALVEOLOPLASTY N/A 03/24/2018   Procedure: EXTRACTIONS X 9;  Surgeon: Ocie Doyne, DDS;  Location: [facility];  Service: Oral Surgery;  Laterality: N/A;   SMALL INTESTINE SURGERY     an infant   WISDOM TOOTH EXTRACTION       PHYSICAL EXAM:  VS: BP 100/62 (BP Location: Left Arm, Patient Position: Sitting)   Ht 5\' 1"  (1.549 m)   Wt 96 lb (43.5 kg)   BMI 18.14 kg/m  Physical Exam Gen: NAD, alert, cooperative with exam, well-appearing MSK:  Neurovascularly intact    Limited ultrasound: right wrist pain:  Normal appearing scaphoid  Normal cmc joint  Medium nerve appears normal in the carpal tunnel Mild effusion at the trapezium and scaphoid   Summary: findings consistent with mild effusion in the carpal joints.   Ultrasound and interpretation by , MD    ASSESSMENT & PLAN:   Wrist sprain, right, initial encounter Acutely occurring. Mild effusion within the carpal joints.  - counseled on home exercise therapy and supportive care - brace - counseled  on voltaren  - could consider injection or imaging

## 2022-06-29 ENCOUNTER — Ambulatory Visit (HOSPITAL_BASED_OUTPATIENT_CLINIC_OR_DEPARTMENT_OTHER)
Admission: RE | Admit: 2022-06-29 | Discharge: 2022-06-29 | Disposition: A | Discharge status: Home / Self Care | Payer: Medicaid Other | Source: Ambulatory Visit | Attending: Family Medicine | Admitting: Family Medicine

## 2022-06-29 ENCOUNTER — Ambulatory Visit (INDEPENDENT_AMBULATORY_CARE_PROVIDER_SITE_OTHER): Payer: Medicaid Other | Admitting: Family Medicine

## 2022-06-29 ENCOUNTER — Encounter: Payer: Self-pay | Admitting: Family Medicine

## 2022-06-29 VITALS — BP 110/70 | Ht 62.0 in | Wt 96.0 lb

## 2022-06-29 DIAGNOSIS — S63501D Unspecified sprain of right wrist, subsequent encounter: Secondary | ICD-10-CM

## 2022-06-29 NOTE — Progress Notes (Signed)
  [name] - 31 y.o. female MRN [id number]  Date of birth: [birth year]  SUBJECTIVE:  Including CC & ROS.  No chief complaint on file.   [name] is a 31 y.o. female that is following up for her wrist pain.  Continues to have the pain intermittently.  Notices it more with gripping a doorknob.    Review of Systems See HPI   HISTORY: Past Medical, Surgical, Social, and Family History Reviewed & Updated per EMR.   Pertinent Historical Findings include:  Past Medical History:  Diagnosis Date   Anemia    Anxiety    Arthritis    Bilateral hearing loss    Bladder mass    Chronic interstitial cystitis    Chronic interstitial cystitis with hematuria    2016   Chronic sinusitis    Fibromyalgia    GERD (gastroesophageal reflux disease)    Gross hematuria    History of Graves' disease    tx done in 2007   History of migraine    History of recurrent UTIs    Hypothyroidism    Iron deficiency anemia    due to SGS   Lower urinary tract symptoms (LUTS)    Lupus (HCC)    rheumologist--  dr Janalyn Rouse devashwer   Malabsorption syndrome    Migraine    Migraine    Passage of loose stools    chronic --  due to short gut syndrome   Pleuritis    S/P radioactive iodine thyroid ablation    2007   SGS (short gut syndrome)    Short gut syndrome    Sjogren's disease (HCC)    Vitiligo    Wears glasses     Past Surgical History:  Procedure Laterality Date   BOWEL RESECTION  newborn   necrotizing enterocolitis (liver patched)   BRONCHOSCOPY  2014   CESAREAN SECTION  01/02/2012   Procedure: CESAREAN SECTION;  Surgeon: Lenoard Aden, MD;  Location: [facility];  Service: Gynecology;  Laterality: N/A;   COLONOSCOPY  07/29/2013   CYSTECTOMY W/ URETEROILEAL CONDUIT  10/2017   CYSTOSCOPY WITH BIOPSY N/A 11/23/2014   Procedure: CYSTOSCOPY WITH BLADDER  BIOPSY AND FULGERATION;  Surgeon: Bjorn Pippin, MD;  Location: [facility];  Service: Urology;  Laterality: N/A;    INTERSTIM IMPLANT REMOVAL  07/2021   KNEE ARTHROSCOPY Right 2008   LIVER SURGERY     an infant   MULTIPLE EXTRACTIONS WITH ALVEOLOPLASTY N/A 03/24/2018   Procedure: EXTRACTIONS X 9;  Surgeon: Ocie Doyne, DDS;  Location: [facility];  Service: Oral Surgery;  Laterality: N/A;   SMALL INTESTINE SURGERY     an infant   WISDOM TOOTH EXTRACTION       PHYSICAL EXAM:  VS: BP 110/70   Ht 5\' 2"  (1.575 m)   Wt 96 lb (43.5 kg)   BMI 17.56 kg/m  Physical Exam Gen: NAD, alert, cooperative with exam, well-appearing MSK:  Neurovascularly intact       ASSESSMENT & PLAN:   Wrist sprain, right, subsequent encounter Pain is ongoing.  Intermittent in nature.  Only has the pain with certain movements.  Appears more consistent with de Quervain's as opposed to the the [facility] joint. -Counseled on home exercise therapy and supportive care. -X-ray. -Could consider injection or physical therapy

## 2022-06-29 NOTE — Patient Instructions (Signed)
Good to see you Please use ice as needed  Please continue the exercises  I will call with the results.   Please send me a message in MyChart with any questions or updates.  Please see me back in 6-8 weeks.   --Dr. Jordan Likes

## 2022-06-29 NOTE — Assessment & Plan Note (Signed)
Pain is ongoing.  Intermittent in nature.  Only has the pain with certain movements.  Appears more consistent with de Quervain's as opposed to the the [facility] joint. -Counseled on home exercise therapy and supportive care. -X-ray. -Could consider injection or physical therapy

## 2022-07-19 ENCOUNTER — Inpatient Hospital Stay: Payer: Medicaid Other | Attending: Hematology and Oncology

## 2022-07-19 DIAGNOSIS — D500 Iron deficiency anemia secondary to blood loss (chronic): Secondary | ICD-10-CM | POA: Insufficient documentation

## 2022-07-19 LAB — CBC WITH DIFFERENTIAL (CANCER CENTER ONLY)
Abs Immature Granulocytes: 0.02 10*3/uL (ref 0.00–0.07)
Basophils Absolute: 0 10*3/uL (ref 0.0–0.1)
Basophils Relative: 0 %
Eosinophils Absolute: 0 10*3/uL (ref 0.0–0.5)
Eosinophils Relative: 1 %
HCT: 36.7 % (ref 36.0–46.0)
Hemoglobin: 12 g/dL (ref 12.0–15.0)
Immature Granulocytes: 0 %
Lymphocytes Relative: 37 %
Lymphs Abs: 1.7 10*3/uL (ref 0.7–4.0)
MCH: 29.9 pg (ref 26.0–34.0)
MCHC: 32.7 g/dL (ref 30.0–36.0)
MCV: 91.5 fL (ref 80.0–100.0)
Monocytes Absolute: 0.2 10*3/uL (ref 0.1–1.0)
Monocytes Relative: 5 %
Neutro Abs: 2.7 10*3/uL (ref 1.7–7.7)
Neutrophils Relative %: 57 %
Platelet Count: 188 10*3/uL (ref 150–400)
RBC: 4.01 MIL/uL (ref 3.87–5.11)
RDW: 13.8 % (ref 11.5–15.5)
WBC Count: 4.7 10*3/uL (ref 4.0–10.5)
nRBC: 0 % (ref 0.0–0.2)

## 2022-07-19 LAB — FERRITIN: Ferritin: 91 ng/mL (ref 11–307)

## 2022-07-19 LAB — IRON AND IRON BINDING CAPACITY (CC-WL,HP ONLY)
Iron: 56 ug/dL (ref 28–170)
Saturation Ratios: 19 % (ref 10.4–31.8)
TIBC: 300 ug/dL (ref 250–450)
UIBC: 244 ug/dL (ref 148–442)

## 2022-07-23 ENCOUNTER — Inpatient Hospital Stay (HOSPITAL_BASED_OUTPATIENT_CLINIC_OR_DEPARTMENT_OTHER): Payer: Medicaid Other | Admitting: Hematology and Oncology

## 2022-07-23 DIAGNOSIS — D500 Iron deficiency anemia secondary to blood loss (chronic): Secondary | ICD-10-CM | POA: Diagnosis not present

## 2022-07-23 NOTE — Assessment & Plan Note (Signed)
History of heavy menstrual bleeding along with urinary tract bleeding from chronic interstitial cystitis. Still persists. Seeing GYN   Treatment summary: IV iron given July 2017, May 2019, November 2019, November 2021, April 2023   Hospitalization 04/13/2020-04/18/2020: Small bowel obstruction   Lab review:  11/05/2018: Hemoglobin 12.3, MCV 90.5 Iron studies: Ferritin 189, TIBC 255, iron saturation 27% 05/12/19: Hb 12.4, MCV: 92.6, Ferritin: 95, Iron sat: 31% 05/11/2020: Hemoglobin 9.9, MCV 87.2, iron saturation 6%, TIBC 366, ferritin 9 02/08/2021: Hemoglobin 10.8, MCV 89.2 05/12/2021: Hemoglobin 11.9  09/13/21: Hemoglobin 11.9, MCV 89, iron saturation 9%, ferritin 11, platelets 239 01/12/2022: Hemoglobin 11.4, MCV 89.6, iron saturation 32%, ferritin 203 05/07/2022: Hemoglobin 12, MCV 90.5, ferritin 203, iron saturation 48% 07/19/2022: Hemoglobin 12, MCV 91.5, ferritin 91, iron saturation 19%  There is no role of iron infusions   Labs in 6 months and follow-up 2 days later with a telephone visit

## 2022-07-23 NOTE — Progress Notes (Signed)
HEMATOLOGY-ONCOLOGY TELEPHONE VISIT PROGRESS NOTE  I connected with our patient on 07/23/22 at  8:30 AM EST by telephone and verified that I am speaking with the correct person using two identifiers.  I discussed the limitations, risks, security and privacy concerns of performing an evaluation and management service by telephone and the availability of in person appointments.  I also discussed with the patient that there may be a patient responsible charge related to this service. The patient expressed understanding and agreed to proceed.   History of Present Illness: [name] is a 32 year old above-mentioned history of iron deficiency anemia due to heavy menstrual cycle related blood loss. She presents to the clinic via phone visit.    REVIEW OF SYSTEMS:   Constitutional: Denies fevers, chills or abnormal weight loss All other systems were reviewed with the patient and are negative. Observations/Objective:     Assessment Plan:  Iron deficiency anemia due to chronic blood loss History of heavy menstrual bleeding along with urinary tract bleeding from chronic interstitial cystitis. Still persists. Seeing GYN   Treatment summary: IV iron given July 2017, May 2019, November 2019, November 2021, April 2023   Hospitalization 04/13/2020-04/18/2020: Small bowel obstruction   Lab review:  11/05/2018: Hemoglobin 12.3, MCV 90.5 Iron studies: Ferritin 189, TIBC 255, iron saturation 27% 05/12/19: Hb 12.4, MCV: 92.6, Ferritin: 95, Iron sat: 31% 05/11/2020: Hemoglobin 9.9, MCV 87.2, iron saturation 6%, TIBC 366, ferritin 9 02/08/2021: Hemoglobin 10.8, MCV 89.2 05/12/2021: Hemoglobin 11.9  09/13/21: Hemoglobin 11.9, MCV 89, iron saturation 9%, ferritin 11, platelets 239 01/12/2022: Hemoglobin 11.4, MCV 89.6, iron saturation 32%, ferritin 203 05/07/2022: Hemoglobin 12, MCV 90.5, ferritin 203, iron saturation 48% 07/19/2022: Hemoglobin 12, MCV 91.5, ferritin 91, iron saturation 19%  There is no  role of iron infusions   Labs in 6 months and follow-up 2 days later with a telephone visit     I discussed the assessment and treatment plan with the patient. The patient was provided an opportunity to ask questions and all were answered. The patient agreed with the plan and demonstrated an understanding of the instructions. The patient was advised to call back or seek an in-person evaluation if the symptoms worsen or if the condition fails to improve as anticipated.   I provided 12 minutes of non-face-to-face time during this encounter.  This includes time for charting and coordination of care   Harriette Ohara, MD  I Gardiner Coins am acting as a scribe for Dr.Kasiya Burck  I have reviewed the above documentation for accuracy and completeness, and I agree with the above.

## 2022-07-24 ENCOUNTER — Telehealth: Payer: Self-pay | Admitting: Hematology and Oncology

## 2022-07-24 NOTE — Telephone Encounter (Signed)
Scheduled appointment per 1/22 los. Left voicemail.

## 2022-08-13 ENCOUNTER — Ambulatory Visit: Payer: Medicaid Other | Admitting: Family Medicine

## 2022-10-15 ENCOUNTER — Encounter: Payer: Self-pay | Admitting: *Deleted

## 2022-10-16 ENCOUNTER — Encounter: Payer: Self-pay | Admitting: Hematology and Oncology

## 2023-01-21 ENCOUNTER — Inpatient Hospital Stay: Payer: Medicaid Other | Attending: Hematology and Oncology

## 2023-01-21 ENCOUNTER — Other Ambulatory Visit: Payer: Self-pay

## 2023-01-21 DIAGNOSIS — N3010 Interstitial cystitis (chronic) without hematuria: Secondary | ICD-10-CM | POA: Insufficient documentation

## 2023-01-21 DIAGNOSIS — Z79899 Other long term (current) drug therapy: Secondary | ICD-10-CM | POA: Insufficient documentation

## 2023-01-21 DIAGNOSIS — D509 Iron deficiency anemia, unspecified: Secondary | ICD-10-CM | POA: Diagnosis not present

## 2023-01-21 DIAGNOSIS — D500 Iron deficiency anemia secondary to blood loss (chronic): Secondary | ICD-10-CM

## 2023-01-21 DIAGNOSIS — N920 Excessive and frequent menstruation with regular cycle: Secondary | ICD-10-CM | POA: Insufficient documentation

## 2023-01-21 LAB — IRON AND IRON BINDING CAPACITY (CC-WL,HP ONLY)
Iron: 62 ug/dL (ref 28–170)
Saturation Ratios: 17 % (ref 10.4–31.8)
TIBC: 357 ug/dL (ref 250–450)
UIBC: 295 ug/dL (ref 148–442)

## 2023-01-21 LAB — FERRITIN: Ferritin: 22 ng/mL (ref 11–307)

## 2023-01-21 LAB — CBC WITH DIFFERENTIAL (CANCER CENTER ONLY)
Abs Immature Granulocytes: 0.02 10*3/uL (ref 0.00–0.07)
Basophils Absolute: 0 10*3/uL (ref 0.0–0.1)
Basophils Relative: 0 %
Eosinophils Absolute: 0 10*3/uL (ref 0.0–0.5)
Eosinophils Relative: 0 %
HCT: 35.1 % — ABNORMAL LOW (ref 36.0–46.0)
Hemoglobin: 11.9 g/dL — ABNORMAL LOW (ref 12.0–15.0)
Immature Granulocytes: 0 %
Lymphocytes Relative: 28 %
Lymphs Abs: 1.6 10*3/uL (ref 0.7–4.0)
MCH: 30.4 pg (ref 26.0–34.0)
MCHC: 33.9 g/dL (ref 30.0–36.0)
MCV: 89.8 fL (ref 80.0–100.0)
Monocytes Absolute: 0.3 10*3/uL (ref 0.1–1.0)
Monocytes Relative: 5 %
Neutro Abs: 3.9 10*3/uL (ref 1.7–7.7)
Neutrophils Relative %: 67 %
Platelet Count: 210 10*3/uL (ref 150–400)
RBC: 3.91 MIL/uL (ref 3.87–5.11)
RDW: 13.4 % (ref 11.5–15.5)
WBC Count: 5.8 10*3/uL (ref 4.0–10.5)
nRBC: 0 % (ref 0.0–0.2)

## 2023-01-23 NOTE — Progress Notes (Signed)
HEMATOLOGY-ONCOLOGY TELEPHONE VISIT PROGRESS NOTE  I connected with our patient on 01/24/23 at  8:30 AM EDT by telephone and verified that I am speaking with the correct person using two identifiers.  I discussed the limitations, risks, security and privacy concerns of performing an evaluation and management service by telephone and the availability of in person appointments.  I also discussed with the patient that there may be a patient responsible charge related to this service. The patient expressed understanding and agreed to proceed.   History of Present Illness: [name] is a 32 year old above-mentioned history of iron deficiency anemia due to heavy menstrual cycle related blood loss. She presents to the clinic via phone visit to review labs.  She is complaining of feeling fatigued overall is able to manage reasonably well.  She continues to have heavy menstrual cycles.   REVIEW OF SYSTEMS:   Constitutional: Denies fevers, chills or abnormal weight loss All other systems were reviewed with the patient and are negative. Observations/Objective:     Assessment Plan:  Iron deficiency anemia due to chronic blood loss History of heavy menstrual bleeding along with urinary tract bleeding from chronic interstitial cystitis. Still persists. Seeing GYN   Treatment summary: IV iron given July 2017, May 2019, November 2019, November 2021, April 2023   Hospitalization 04/13/2020-04/18/2020: Small bowel obstruction   Lab review:  11/05/2018: Hemoglobin 12.3, MCV 90.5 Iron studies: Ferritin 189, TIBC 255, iron saturation 27% 05/12/19: Hb 12.4, MCV: 92.6, Ferritin: 95, Iron sat: 31% 05/11/2020: Hemoglobin 9.9, MCV 87.2, iron saturation 6%, TIBC 366, ferritin 9 02/08/2021: Hemoglobin 10.8, MCV 89.2 05/12/2021: Hemoglobin 11.9  09/13/21: Hemoglobin 11.9, MCV 89, iron saturation 9%, ferritin 11, platelets 239 01/12/2022: Hemoglobin 11.4, MCV 89.6, iron saturation 32%, ferritin 203 05/07/2022:  Hemoglobin 12, MCV 90.5, ferritin 203, iron saturation 48% 07/19/2022: Hemoglobin 12, MCV 91.5, ferritin 91, iron saturation 19% 01/21/2023: Hemoglobin 11.9, MCV 89.8,  ferritin 22, iron saturation 17%   There is no role of iron infusions although she is starting to feel more fatigued lately.   Labs in 3 months and follow-up 2 days later with a telephone visit    I discussed the assessment and treatment plan with the patient. The patient was provided an opportunity to ask questions and all were answered. The patient agreed with the plan and demonstrated an understanding of the instructions. The patient was advised to call back or seek an in-person evaluation if the symptoms worsen or if the condition fails to improve as anticipated.   I provided 12 minutes of non-face-to-face time during this encounter.  This includes time for charting and coordination of care   Tamsen Meek, MD  I Janan Ridge am acting as a scribe for Dr.Vinay Gudena  I have reviewed the above documentation for accuracy and completeness, and I agree with the above.

## 2023-01-24 ENCOUNTER — Inpatient Hospital Stay (HOSPITAL_BASED_OUTPATIENT_CLINIC_OR_DEPARTMENT_OTHER): Payer: Medicaid Other | Admitting: Hematology and Oncology

## 2023-01-24 DIAGNOSIS — D500 Iron deficiency anemia secondary to blood loss (chronic): Secondary | ICD-10-CM

## 2023-01-24 NOTE — Assessment & Plan Note (Signed)
History of heavy menstrual bleeding along with urinary tract bleeding from chronic interstitial cystitis. Still persists. Seeing GYN   Treatment summary: IV iron given July 2017, May 2019, November 2019, November 2021, April 2023   Hospitalization 04/13/2020-04/18/2020: Small bowel obstruction   Lab review:  11/05/2018: Hemoglobin 12.3, MCV 90.5 Iron studies: Ferritin 189, TIBC 255, iron saturation 27% 05/12/19: Hb 12.4, MCV: 92.6, Ferritin: 95, Iron sat: 31% 05/11/2020: Hemoglobin 9.9, MCV 87.2, iron saturation 6%, TIBC 366, ferritin 9 02/08/2021: Hemoglobin 10.8, MCV 89.2 05/12/2021: Hemoglobin 11.9  09/13/21: Hemoglobin 11.9, MCV 89, iron saturation 9%, ferritin 11, platelets 239 01/12/2022: Hemoglobin 11.4, MCV 89.6, iron saturation 32%, ferritin 203 05/07/2022: Hemoglobin 12, MCV 90.5, ferritin 203, iron saturation 48% 07/19/2022: Hemoglobin 12, MCV 91.5, ferritin 91, iron saturation 19% 01/21/2023: Hemoglobin 11.9, MCV 89.8,  ferritin 22, iron saturation 17%   There is no role of iron infusions   Labs in 6 months and follow-up 2 days later with a telephone visit

## 2023-02-04 ENCOUNTER — Emergency Department (HOSPITAL_BASED_OUTPATIENT_CLINIC_OR_DEPARTMENT_OTHER): Payer: Medicaid Other

## 2023-02-04 ENCOUNTER — Other Ambulatory Visit: Payer: Self-pay

## 2023-02-04 ENCOUNTER — Emergency Department (HOSPITAL_BASED_OUTPATIENT_CLINIC_OR_DEPARTMENT_OTHER)
Admission: EM | Admit: 2023-02-04 | Discharge: 2023-02-04 | Disposition: A | Discharge status: Home / Self Care | Payer: Medicaid Other | Attending: Emergency Medicine | Admitting: Emergency Medicine

## 2023-02-04 DIAGNOSIS — N83202 Unspecified ovarian cyst, left side: Secondary | ICD-10-CM | POA: Insufficient documentation

## 2023-02-04 DIAGNOSIS — N939 Abnormal uterine and vaginal bleeding, unspecified: Secondary | ICD-10-CM

## 2023-02-04 DIAGNOSIS — D649 Anemia, unspecified: Secondary | ICD-10-CM | POA: Insufficient documentation

## 2023-02-04 DIAGNOSIS — R102 Pelvic and perineal pain: Secondary | ICD-10-CM

## 2023-02-04 DIAGNOSIS — E876 Hypokalemia: Secondary | ICD-10-CM | POA: Diagnosis not present

## 2023-02-04 LAB — BASIC METABOLIC PANEL
Anion gap: 8 (ref 5–15)
BUN: 7 mg/dL (ref 6–20)
CO2: 19 mmol/L — ABNORMAL LOW (ref 22–32)
Calcium: 8.7 mg/dL — ABNORMAL LOW (ref 8.9–10.3)
Chloride: 111 mmol/L (ref 98–111)
Creatinine, Ser: 0.74 mg/dL (ref 0.44–1.00)
GFR, Estimated: >60 mL/min (ref >60)
Glucose, Bld: 89 mg/dL (ref 70–99)
Potassium: 3.4 mmol/L — ABNORMAL LOW (ref 3.5–5.1)
Sodium: 138 mmol/L (ref 135–145)

## 2023-02-04 LAB — CBC
HCT: 32.9 % — ABNORMAL LOW (ref 36.0–46.0)
Hemoglobin: 10.8 g/dL — ABNORMAL LOW (ref 12.0–15.0)
MCH: 29.8 pg (ref 26.0–34.0)
MCHC: 32.8 g/dL (ref 30.0–36.0)
MCV: 90.9 fL (ref 80.0–100.0)
Platelets: 185 10*3/uL (ref 150–400)
RBC: 3.62 MIL/uL — ABNORMAL LOW (ref 3.87–5.11)
RDW: 13.6 % (ref 11.5–15.5)
WBC: 5.7 10*3/uL (ref 4.0–10.5)
nRBC: 0 % (ref 0.0–0.2)

## 2023-02-04 LAB — HCG, QUANTITATIVE, PREGNANCY: hCG, Beta Chain, Quant, S: <1 m[IU]/mL (ref <5)

## 2023-02-04 MED ORDER — NORETHINDRONE-ETH ESTRADIOL 1-35 MG-MCG PO TABS: 1.0000 | ORAL_TABLET | Freq: Every day | ORAL | 0 refills | Status: AC

## 2023-02-04 NOTE — ED Provider Notes (Signed)
[facility] Provider Note   CSN: [id number] Arrival date & time: 02/04/23  1723     History  Chief Complaint  Patient presents with   Vaginal Bleeding    [name] is a 32 y.o. female.   Vaginal Bleeding   32 year old female presents emergency department with complaints of pelvic pain as well as vaginal bleeding.  Patient states that she has been on her menstrual cycle for now 12 days.  States that she usually is around 7 to 8 days and has 1 about every month.  States that pain is lower pelvic region with greater on the right than the left.  Patient states he has a history of ovarian cysts as well as endometriosis for which she follows with OB/GYN in the outpatient setting.  Patient also states that she has had 1 or 2 episodes of emesis over the course of current menstrual cycle with worsened pelvic pain.  Denies any fever, chills, urinary symptoms, change in bowel habits.  Past medical history significant for iron deficiency anemia, fibromyalgia, migraine, Graves' disease, GERD, lupus, Sjogren's disease, vitiligo,  Home Medications Prior to Admission medications   Medication Sig Start Date End Date Taking? Authorizing Provider  norethindrone-ethinyl estradiol 1/35 (ORTHO-NOVUM) tablet Take 1 tablet by mouth daily for 7 days. 02/04/23 02/11/23 Yes Peter Garter, PA  AMBULATORY NON FORMULARY MEDICATION Iron Infusions every 3 months done at [facility]- ordered thru [facility]    [provider]  cetirizine (ZYRTEC) 5 MG tablet Take 5 mg by mouth daily.    [provider]  cholestyramine Lanetta Inch) 4 GM/DOSE powder Take 1 packet (4 g total) by mouth daily. 09/25/21 10/25/21  Rachael Fee, MD  clindamycin (CLEOCIN T) 1 % external solution Apply a thin layer to the face/chest/back every morning. Do not rinse off. 08/30/20   [provider]  emtricitabine-tenofovir (TRUVADA) 200-300 MG tablet Take 1  tablet by mouth daily. 11/23/20   [provider]  EPINEPHrine 0.3 mg/0.3 mL IJ SOAJ injection Inject 0.3 mg into the muscle as needed for anaphylaxis. 03/31/19   [provider]  Erenumab-aooe (AIMOVIG) 70 MG/ML SOAJ Inject 70 mg into the skin at bedtime. 08/11/20   Glean Salvo, NP  Fluocinolone Acetonide Body 0.01 % OIL Apply to scalp daily as needed for scalp lesions/itching. 08/30/20   [provider]  hydroxychloroquine (PLAQUENIL) 200 MG tablet Take 400 mg by mouth every morning.     [provider]  hydroxypropyl methylcellulose / hypromellose (ISOPTO TEARS / GONIOVISC) 2.5 % ophthalmic solution Place 2 drops into both eyes 3 (three) times daily as needed for dry eyes.  04/27/19   [provider]  levothyroxine (SYNTHROID) 100 MCG tablet Take 112 mcg by mouth daily. 02/27/17   [provider]  mirtazapine (REMERON) 15 MG tablet Take 1 tablet by mouth daily. 11/22/20   [provider]  omalizumab Geoffry Paradise) 150 MG/ML prefilled syringe Inject 150 mg into the skin every 14 (fourteen) days.    [provider]  omeprazole (PRILOSEC) 20 MG capsule Take 20 mg by mouth at bedtime.     [provider]  ondansetron (ZOFRAN-ODT) 8 MG disintegrating tablet Take 1 tablet (8 mg total) by mouth every 8 (eight) hours as needed. 08/11/20   Glean Salvo, NP  pantoprazole (PROTONIX) 40 MG tablet Take 1 tablet by mouth daily. 11/23/20   [provider]  prazosin (MINIPRESS) 5 MG capsule Take  1 capsule by mouth in the morning and at bedtime. 11/23/20   [provider]  predniSONE (DELTASONE) 5 MG tablet Take 1 tablet by mouth as needed. 04/27/19   [provider]  propranolol ER (INDERAL LA) 80 MG 24 hr capsule Take 1 capsule (80 mg total) by mouth at bedtime. 08/11/20   Glean Salvo, NP  SUMAtriptan (IMITREX) 6 MG/0.5ML SOLN injection Inject 0.5 mLs (6 mg total) into the skin every 2 (two) hours as needed for  migraine or headache. May repeat in 2 hours if headache persists or recurs. 08/11/20   Glean Salvo, NP  tiZANidine (ZANAFLEX) 4 MG tablet Take 1 tablet (4 mg total) by mouth every 8 (eight) hours as needed for muscle spasms. 08/11/20   Glean Salvo, NP  traMADol (ULTRAM) 50 MG tablet Take 1 tablet by mouth every 6 (six) hours as needed. 11/22/20   [provider]  White Petrolatum-Mineral Oil (WH PETROL-MINERAL OIL-LANOLIN) 0.1-0.1 % OINT Place 1 application into both eyes at bedtime. 04/28/19   [provider]      Allergies    Penicillins, Buprenorphine hcl, Morphine and codeine, and Hydrocodone-acetaminophen    Review of Systems   Review of Systems  Genitourinary:  Positive for vaginal bleeding.  All other systems reviewed and are negative.   Physical Exam Updated Vital Signs BP 92/69 (BP Location: Left Arm)   Pulse 67   Temp 98.1 F (36.7 C) (Oral)   Resp 18   Ht 5\' 2"  (1.575 m)   Wt 43.1 kg   SpO2 100%   BMI 17.38 kg/m  Physical Exam Vitals and nursing note reviewed.  Constitutional:      General: She is not in acute distress.    Appearance: She is well-developed.  HENT:     Head: Normocephalic and atraumatic.  Eyes:     Conjunctiva/sclera: Conjunctivae normal.  Cardiovascular:     Rate and Rhythm: Normal rate and regular rhythm.     Heart sounds: No murmur heard. Pulmonary:     Effort: Pulmonary effort is normal. No respiratory distress.     Breath sounds: Normal breath sounds.  Abdominal:     Palpations: Abdomen is soft.     Tenderness: There is abdominal tenderness. There is no right CVA tenderness or left CVA tenderness.     Comments: Ileostomy present.  Lower midline pelvic tenderness as well as some tenderness in right lower quadrant  Musculoskeletal:        General: No swelling.     Cervical back: Neck supple.  Skin:    General: Skin is warm and dry.     Capillary Refill: Capillary refill takes less than 2 seconds.  Neurological:      Mental Status: She is alert.  Psychiatric:        Mood and Affect: Mood normal.     ED Results / Procedures / Treatments   Labs (all labs ordered are listed, but only abnormal results are displayed) Labs Reviewed  BASIC METABOLIC PANEL - Abnormal; Notable for the following components:      Result Value   Potassium 3.4 (*)    CO2 19 (*)    Calcium 8.7 (*)    All other components within normal limits  CBC - Abnormal; Notable for the following components:   RBC 3.62 (*)    Hemoglobin 10.8 (*)    HCT 32.9 (*)    All other components within normal limits  HCG, QUANTITATIVE, PREGNANCY  EKG None  Radiology US PELVIC COMPLETE W TRANSVAGINAL AND TORSION R/O  Result Date: 02/04/2023 CLINICAL DATA:  Pelvic pain and vaginal bleeding. EXAM: TRANSABDOMINAL AND TRANSVAGINAL ULTRASOUND OF PELVIS DOPPLER ULTRASOUND OF OVARIES TECHNIQUE: Both transabdominal and transvaginal ultrasound examinations of the pelvis were performed. Transabdominal technique was performed for global imaging of the pelvis including uterus, ovaries, adnexal regions, and pelvic cul-de-sac. It was necessary to proceed with endovaginal exam following the transabdominal exam to visualize the uterus, endometrium, bilateral ovaries and bilateral adnexa. Color and duplex Doppler ultrasound was utilized to evaluate blood flow to the ovaries. COMPARISON:  September 04, 2022 FINDINGS: Uterus Measurements: 9.5 cm x 4.6 cm x 4.9 cm = volume: 157 mL. No fibroids or other mass visualized. Endometrium Thickness: 10.1 mm.  Heterogeneous in appearance. Right ovary Measurements: 4.7 cm x 2.3 cm x 2.8 cm = volume: 15.9 mL. Normal appearance/no adnexal mass. Left ovary Measurements: 3.4 cm x 2.0 cm x 1.5 cm = volume: 5.1 mL. A 2.2 cm x 1.5 cm x 1.0 cm simple left ovarian cyst is seen. A 0.9 cm x 0.8 cm x 1.2 cm complex hypoechoic lesion is also noted. No abnormal flow is seen within these areas on color Doppler evaluation. The large complex left  ovarian cyst seen on the prior study is not clearly identified. Pulsed Doppler evaluation of both ovaries demonstrates normal low-resistance arterial and venous waveforms. Other findings No abnormal free fluid. IMPRESSION: 1. 2.2 cm x 1.5 cm x 1.0 cm simple left ovarian cyst. 2. Additional findings which may represent a small complex left ovarian cyst. Correlation with 6 week follow-up pelvic ultrasound is recommended to determine stability. This recommendation follows the consensus statement: Management of Asymptomatic Ovarian and Other Adnexal Cysts Imaged at [place]: Society of Radiologists in Ultrasound Consensus Conference Statement. Radiology 2010; [phone]. Electronically Signed   By: Aram Candela M.D.   On: 02/04/2023 20:37    Procedures Procedures    Medications Ordered in ED Medications - No data to display  ED Course/ Medical Decision Making/ A&P                                 Medical Decision Making Amount and/or Complexity of Data Reviewed Labs: ordered. Radiology: ordered.  Risk Prescription drug management.   This patient presents to the ED for concern of vaginal bleeding, this involves an extensive number of treatment options, and is a complaint that carries with it a high risk of complications and morbidity.  The differential diagnosis includes coagulopathy, ovarian dysfunction, endometriosis, polyp, adenomyoma, leiomyoma, malignancy/hyperplasia of endometrium, ovarian cyst, ectopic pregnancy, endometriosis   Co morbidities that complicate the patient evaluation  See HPI   Additional history obtained:  Additional history obtained from EMR External records from outside source obtained and reviewed including hospital   Lab Tests:  I Ordered, and personally interpreted labs.  The pertinent results include: No leukocytosis.  Patient with evidence anemia with a hemoglobin of 10.8 of which is decreased from laboratory studies in July 2011 0.9.  Platelets within  range.  Mild hypokalemia as well as decrease in bicarb and hypocalcemia of 3.4, 19 and 8.7 respectively but otherwise, electrolytes within normal limits.  No renal dysfunction.  Urine pregnancy negative.   Imaging Studies ordered:  I ordered imaging studies including pelvic ultrasound I independently visualized and interpreted imaging which showed 2.2 x 1.5 x 1.0 simple left ovarian cyst.  Additional left small complex  ovarian cyst. I agree with the radiologist interpretation  Cardiac Monitoring: / EKG:  The patient was maintained on a cardiac monitor.  I personally viewed and interpreted the cardiac monitored which showed an underlying rhythm of: Sinus rhythm   Consultations Obtained:  N/a   Problem List / ED Course / Critical interventions / Medication management  Vaginal bleeding, ovarian cyst, pelvic pain, anemia Reevaluation of the patient showed that the patient stayed the same I have reviewed the patients home medicines and have made adjustments as needed   Social Determinants of Health:  Denies tobacco, illicit drug use   Test / Admission - Considered:  Vaginal bleeding, ovarian cyst, pelvic pain, anemia Vitals signs within normal range and stable throughout visit. Laboratory/imaging studies significant for: See above 32 year old female presents emergency department with complaints of 12 days of vaginal bleeding, intermittent pelvic pain.  Patient with history of bilateral ovarian cyst as well as endometriosis.  Patient's laboratory studies consistent with decrease in patient's hemoglobin from around 11.9-10.8 today from laboratory studies drawn last month.  Patient without symptoms such as lightheadedness, dizziness, shortness of breath concerning for worsening symptomatic anemia.  Patient declined pelvic exam so unable to assess for amount of bleeding.  Patient states the bleeding has somewhat improved since beginning of menstrual cycle but due to persistence as well as  persistence of discomfort, presented to emergency department today.  Pelvic ultrasound was performed which showed 2 left-sided ovarian cysts but without evidence of ovarian torsion or other acute abnormality.  I suspect patient's symptoms could be secondary to underlying endometriosis, left-sided ovarian cysts.  Discussion was had with patient regarding treatment of patient's prolonged period with hormonal therapy of which she elected for given that she has tried NSAIDs in the outpatient setting without relief.  Will begin Ortho-Novum and have patient follow-up in the outpatient setting with OB/GYN.  Treatment plan discussed at length with patient and she acknowledged understanding was agreeable to said plan.  Patient overall well-appearing, afebrile in no acute distress. Worrisome signs and symptoms were discussed with the patient, and the patient acknowledged understanding to return to the ED if noticed. Patient was stable upon discharge.          Final Clinical Impression(s) / ED Diagnoses Final diagnoses:  Vaginal bleeding  Pelvic pain  Cyst of left ovary  Anemia, unspecified type    Rx / DC Orders ED Discharge Orders          Ordered    norethindrone-ethinyl estradiol 1/35 (ORTHO-NOVUM) tablet  Daily        02/04/23 2057              Peter Garter, [place] 02/04/23 2127    Glyn Ade, MD 02/04/23 2154

## 2023-02-04 NOTE — ED Triage Notes (Signed)
Patient presents to ED via POV from home. Here with vaginal bleeding and abdominal cramping. Reports "I was on my period but I should be off now. Today makes day 11".

## 2023-02-04 NOTE — ED Notes (Signed)
Patient transported to Ultrasound 

## 2023-02-04 NOTE — ED Notes (Signed)
ED Provider at bedside. 

## 2023-02-04 NOTE — Discharge Instructions (Signed)
As discussed, visit emergency department today overall reassuring.  Your hemoglobin or red blood cells were decreased from your baseline most likely from your vaginal bleeding.  You do have evidence of 2 ovarian cyst on the left side.  We recommend continued use of ibuprofen at home for pain/discomfort.  Will also send in medication to regulate your cycle.  Take medication as prescribed.  Recommend calling your OB/GYN for further assessment/evaluation.  Please do not hesitate to return to emergency department if the worrisome signs and symptoms we discussed become apparent.

## 2023-03-25 ENCOUNTER — Inpatient Hospital Stay: Payer: Medicaid Other | Attending: Hematology and Oncology

## 2023-03-25 DIAGNOSIS — D500 Iron deficiency anemia secondary to blood loss (chronic): Secondary | ICD-10-CM | POA: Diagnosis present

## 2023-03-25 DIAGNOSIS — N3010 Interstitial cystitis (chronic) without hematuria: Secondary | ICD-10-CM | POA: Insufficient documentation

## 2023-03-25 LAB — CBC WITH DIFFERENTIAL (CANCER CENTER ONLY)
Abs Immature Granulocytes: 0.02 10*3/uL (ref 0.00–0.07)
Basophils Absolute: 0 10*3/uL (ref 0.0–0.1)
Basophils Relative: 0 %
Eosinophils Absolute: 0 10*3/uL (ref 0.0–0.5)
Eosinophils Relative: 1 %
HCT: 37.8 % (ref 36.0–46.0)
Hemoglobin: 12.5 g/dL (ref 12.0–15.0)
Immature Granulocytes: 0 %
Lymphocytes Relative: 34 %
Lymphs Abs: 1.5 10*3/uL (ref 0.7–4.0)
MCH: 29.6 pg (ref 26.0–34.0)
MCHC: 33.1 g/dL (ref 30.0–36.0)
MCV: 89.4 fL (ref 80.0–100.0)
Monocytes Absolute: 0.3 10*3/uL (ref 0.1–1.0)
Monocytes Relative: 7 %
Neutro Abs: 2.6 10*3/uL (ref 1.7–7.7)
Neutrophils Relative %: 58 %
Platelet Count: 204 10*3/uL (ref 150–400)
RBC: 4.23 MIL/uL (ref 3.87–5.11)
RDW: 13.9 % (ref 11.5–15.5)
WBC Count: 4.5 10*3/uL (ref 4.0–10.5)
nRBC: 0 % (ref 0.0–0.2)

## 2023-03-25 LAB — FERRITIN: Ferritin: 13 ng/mL (ref 11–307)

## 2023-03-25 LAB — IRON AND IRON BINDING CAPACITY (CC-WL,HP ONLY)
Iron: 97 ug/dL (ref 28–170)
Saturation Ratios: 22 % (ref 10.4–31.8)
TIBC: 437 ug/dL (ref 250–450)
UIBC: 340 ug/dL (ref 148–442)

## 2023-03-27 ENCOUNTER — Inpatient Hospital Stay (HOSPITAL_BASED_OUTPATIENT_CLINIC_OR_DEPARTMENT_OTHER): Payer: Medicaid Other | Admitting: Hematology and Oncology

## 2023-03-27 DIAGNOSIS — D500 Iron deficiency anemia secondary to blood loss (chronic): Secondary | ICD-10-CM

## 2023-03-27 NOTE — Progress Notes (Signed)
HEMATOLOGY-ONCOLOGY TELEPHONE VISIT PROGRESS NOTE  I connected with our patient on 03/27/23 at  8:45 AM EDT by telephone and verified that I am speaking with the correct person using two identifiers.  I discussed the limitations, risks, security and privacy concerns of performing an evaluation and management service by telephone and the availability of in person appointments.  I also discussed with the patient that there may be a patient responsible charge related to this service. The patient expressed understanding and agreed to proceed.   History of Present Illness: Follow-up of iron deficiency anemia Discussed the use of AI scribe software for clinical note transcription with the patient, who gave verbal consent to proceed.  History of Present Illness   The patient, with a history of iron deficiency anemia, has not required iron supplementation for a year and a half. She reports a recent increase in fatigue, which she attributes to increased physical activity and rapid blood pressure drops. She notes that she has become accustomed to her fatigue, making it difficult for her to discern any changes. Her recent lab results show a hemoglobin of 12.5 and an iron saturation of 22%, both within normal limits. However, her ferritin levels have been slowly declining over time, from 91 in January to 22 in July, and currently at 13.        REVIEW OF SYSTEMS:   Constitutional: Denies fevers, chills or abnormal weight loss All other systems were reviewed with the patient and are negative. Observations/Objective:     Assessment Plan:  Iron deficiency anemia due to chronic blood loss History of heavy menstrual bleeding along with urinary tract bleeding from chronic interstitial cystitis. Still persists. Seeing GYN   Treatment summary: IV iron given July 2017, May 2019, November 2019, November 2021, April 2023   Hospitalization 04/13/2020-04/18/2020: Small bowel obstruction   Lab review:  11/05/2018:  Hemoglobin 12.3, MCV 90.5 Iron studies: Ferritin 189, TIBC 255, iron saturation 27% 05/12/19: Hb 12.4, MCV: 92.6, Ferritin: 95, Iron sat: 31% 05/11/2020: Hemoglobin 9.9, MCV 87.2, iron saturation 6%, TIBC 366, ferritin 9 02/08/2021: Hemoglobin 10.8, MCV 89.2 05/12/2021: Hemoglobin 11.9  09/13/21: Hemoglobin 11.9, MCV 89, iron saturation 9%, ferritin 11, platelets 239 01/12/2022: Hemoglobin 11.4, MCV 89.6, iron saturation 32%, ferritin 203 05/07/2022: Hemoglobin 12, MCV 90.5, ferritin 203, iron saturation 48% 07/19/2022: Hemoglobin 12, MCV 91.5, ferritin 91, iron saturation 19% 01/21/2023: Hemoglobin 11.9, MCV 89.8,  ferritin 22, iron saturation 17% 03/25/2023: Hemoglobin 12.5, MCV 89.4, iron saturation 22%, ferritin 13   There is no role of iron infusions.   Labs in 3 months and follow-up 2 days later with a telephone visit      I discussed the assessment and treatment plan with the patient. The patient was provided an opportunity to ask questions and all were answered. The patient agreed with the plan and demonstrated an understanding of the instructions. The patient was advised to call back or seek an in-person evaluation if the symptoms worsen or if the condition fails to improve as anticipated.   I provided 12 minutes of non-face-to-face time during this encounter.  This includes time for charting and coordination of care   Tamsen Meek, MD

## 2023-03-27 NOTE — Assessment & Plan Note (Addendum)
History of heavy menstrual bleeding along with urinary tract bleeding from chronic interstitial cystitis. Still persists. Seeing GYN   Treatment summary: IV iron given July 2017, May 2019, November 2019, November 2021, April 2023   Hospitalization 04/13/2020-04/18/2020: Small bowel obstruction   Lab review:  11/05/2018: Hemoglobin 12.3, MCV 90.5 Iron studies: Ferritin 189, TIBC 255, iron saturation 27% 05/12/19: Hb 12.4, MCV: 92.6, Ferritin: 95, Iron sat: 31% 05/11/2020: Hemoglobin 9.9, MCV 87.2, iron saturation 6%, TIBC 366, ferritin 9 02/08/2021: Hemoglobin 10.8, MCV 89.2 05/12/2021: Hemoglobin 11.9  09/13/21: Hemoglobin 11.9, MCV 89, iron saturation 9%, ferritin 11, platelets 239 01/12/2022: Hemoglobin 11.4, MCV 89.6, iron saturation 32%, ferritin 203 05/07/2022: Hemoglobin 12, MCV 90.5, ferritin 203, iron saturation 48% 07/19/2022: Hemoglobin 12, MCV 91.5, ferritin 91, iron saturation 19% 01/21/2023: Hemoglobin 11.9, MCV 89.8,  ferritin 22, iron saturation 17% 03/25/2023: Hemoglobin 12.5, MCV 89.4, iron saturation 22%, ferritin 13   There is no role of iron infusions.   Labs in 3 months and follow-up 2 days later with a telephone visit

## 2023-09-19 ENCOUNTER — Encounter (HOSPITAL_BASED_OUTPATIENT_CLINIC_OR_DEPARTMENT_OTHER): Payer: Self-pay

## 2023-09-19 ENCOUNTER — Other Ambulatory Visit: Payer: Self-pay

## 2023-09-19 ENCOUNTER — Emergency Department (HOSPITAL_BASED_OUTPATIENT_CLINIC_OR_DEPARTMENT_OTHER): Admitting: Radiology

## 2023-09-19 ENCOUNTER — Emergency Department (HOSPITAL_BASED_OUTPATIENT_CLINIC_OR_DEPARTMENT_OTHER)
Admission: EM | Admit: 2023-09-19 | Discharge: 2023-09-20 | Disposition: A | Discharge status: Other Acute Inpatient Hospital | Attending: Emergency Medicine | Admitting: Emergency Medicine

## 2023-09-19 DIAGNOSIS — R0602 Shortness of breath: Secondary | ICD-10-CM | POA: Diagnosis not present

## 2023-09-19 DIAGNOSIS — R799 Abnormal finding of blood chemistry, unspecified: Secondary | ICD-10-CM | POA: Diagnosis present

## 2023-09-19 DIAGNOSIS — R071 Chest pain on breathing: Secondary | ICD-10-CM | POA: Insufficient documentation

## 2023-09-19 LAB — BASIC METABOLIC PANEL
Anion gap: 9 (ref 5–15)
BUN: 7 mg/dL (ref 6–20)
CO2: 17 mmol/L — ABNORMAL LOW (ref 22–32)
Calcium: 8.7 mg/dL — ABNORMAL LOW (ref 8.9–10.3)
Chloride: 112 mmol/L — ABNORMAL HIGH (ref 98–111)
Creatinine, Ser: 0.63 mg/dL (ref 0.44–1.00)
GFR, Estimated: >60 mL/min (ref >60)
Glucose, Bld: 87 mg/dL (ref 70–99)
Potassium: 3.2 mmol/L — ABNORMAL LOW (ref 3.5–5.1)
Sodium: 138 mmol/L (ref 135–145)

## 2023-09-19 LAB — CBC
HCT: 35.4 % — ABNORMAL LOW (ref 36.0–46.0)
Hemoglobin: 11.7 g/dL — ABNORMAL LOW (ref 12.0–15.0)
MCH: 29.3 pg (ref 26.0–34.0)
MCHC: 33.1 g/dL (ref 30.0–36.0)
MCV: 88.7 fL (ref 80.0–100.0)
Platelets: 213 10*3/uL (ref 150–400)
RBC: 3.99 MIL/uL (ref 3.87–5.11)
RDW: 17.3 % — ABNORMAL HIGH (ref 11.5–15.5)
WBC: 7.7 10*3/uL (ref 4.0–10.5)
nRBC: 0 % (ref 0.0–0.2)

## 2023-09-19 LAB — TROPONIN I (HIGH SENSITIVITY): Troponin I (High Sensitivity): 2 ng/L (ref <18)

## 2023-09-19 LAB — PHOSPHORUS: Phosphorus: 1 mg/dL — CL (ref 2.5–4.6)

## 2023-09-19 MED ORDER — POTASSIUM & SODIUM PHOSPHATES 280-160-250 MG PO PACK: 2.0000 | PACK | ORAL | Status: DC

## 2023-09-19 MED ORDER — K PHOS MONO-SOD PHOS DI & MONO 155-852-130 MG PO TABS
750.0000 mg | ORAL_TABLET | Freq: Once | ORAL | Status: AC
Start: 2023-09-19 — End: 2023-09-19
  Administered 2023-09-19: 750 mg via ORAL
  Filled 2023-09-19: qty 3

## 2023-09-19 NOTE — ED Provider Notes (Signed)
 [place] EMERGENCY DEPARTMENT AT [facility] Provider Note   CSN: [id number] Arrival date & time: 09/19/23  2035     History {Add pertinent medical, surgical, social history, OB history to HPI:1} No chief complaint on file.   [name] is a 33 y.o. female presented to ED with concern for 4 months of pleuritic left-sided lung pain, also concern for abnormal blood test.  Patient reports that shortness of breath has been unchanged for months.  She gets iron infusions.  I reviewed her external records and she had a CT PE study performed at [facility] 3 weeks ago on September 05, 2023 for pleuritic chest pain, noting no pulmonary embolism, mild atelectasis of the posterior left lung base, no other abnormalities noted.  No pericardial effusion.  She states today she was told to come to the ER because her potassium and phosphorus levels are quite low, and her nephrologist referred her in.   She has a history of lupus.  She has a history of a bladder resection and removal with urostomy.  She says she does sometimes get UTIs but is not currently having any of those symptoms.  Per my review of her records she had a renal function panel performed today which showed a calcium of 8.4, phosphorus less than 1.1, creatinine 0.73, GFR normal, potassium 3.5, sodium 139.  Her vitamin D 25-hydroxy level was 24.2, "insufficient." Hgb 11.6.  UA with leukocytes, negative nitrites  HPI     Home Medications Prior to Admission medications   Medication Sig Start Date End Date Taking? Authorizing Provider  AMBULATORY NON FORMULARY MEDICATION Iron Infusions every 3 months done at [facility]- ordered thru [facility]    [provider]  cetirizine (ZYRTEC) 5 MG tablet Take 5 mg by mouth daily.    [provider]  cholestyramine Lanetta Inch) 4 GM/DOSE powder Take 1 packet (4 g total) by mouth daily. 09/25/21 10/25/21  Rachael Fee, MD  clindamycin (CLEOCIN T) 1 %  external solution Apply a thin layer to the face/chest/back every morning. Do not rinse off. 08/30/20   [provider]  emtricitabine-tenofovir (TRUVADA) 200-300 MG tablet Take 1 tablet by mouth daily. 11/23/20   [provider]  EPINEPHrine 0.3 mg/0.3 mL IJ SOAJ injection Inject 0.3 mg into the muscle as needed for anaphylaxis. 03/31/19   [provider]  Erenumab-aooe (AIMOVIG) 70 MG/ML SOAJ Inject 70 mg into the skin at bedtime. 08/11/20   Glean Salvo, NP  Fluocinolone Acetonide Body 0.01 % OIL Apply to scalp daily as needed for scalp lesions/itching. 08/30/20   [provider]  hydroxychloroquine (PLAQUENIL) 200 MG tablet Take 400 mg by mouth every morning.     [provider]  hydroxypropyl methylcellulose / hypromellose (ISOPTO TEARS / GONIOVISC) 2.5 % ophthalmic solution Place 2 drops into both eyes 3 (three) times daily as needed for dry eyes.  04/27/19   [provider]  levothyroxine (SYNTHROID) 100 MCG tablet Take 112 mcg by mouth daily. 02/27/17   [provider]  mirtazapine (REMERON) 15 MG tablet Take 1 tablet by mouth daily. 11/22/20   [provider]  norethindrone-ethinyl estradiol 1/35 (ORTHO-NOVUM) tablet Take 1 tablet by mouth daily for 7 days. 02/04/23 02/11/23  Peter Garter, PA  omalizumab Geoffry Paradise) 150 MG/ML prefilled syringe Inject 150 mg into the skin every 14 (fourteen) days.    [provider]  omeprazole (PRILOSEC) 20 MG capsule Take 20 mg by mouth at bedtime.  [provider]  ondansetron (ZOFRAN-ODT) 8 MG disintegrating tablet Take 1 tablet (8 mg total) by mouth every 8 (eight) hours as needed. 08/11/20   Glean Salvo, NP  pantoprazole (PROTONIX) 40 MG tablet Take 1 tablet by mouth daily. 11/23/20   [provider]  prazosin (MINIPRESS) 5 MG capsule Take 1 capsule by mouth in the morning and at bedtime. 11/23/20   [provider]  predniSONE (DELTASONE) 5 MG tablet  Take 1 tablet by mouth as needed. 04/27/19   [provider]  propranolol ER (INDERAL LA) 80 MG 24 hr capsule Take 1 capsule (80 mg total) by mouth at bedtime. 08/11/20   Glean Salvo, NP  SUMAtriptan (IMITREX) 6 MG/0.5ML SOLN injection Inject 0.5 mLs (6 mg total) into the skin every 2 (two) hours as needed for migraine or headache. May repeat in 2 hours if headache persists or recurs. 08/11/20   Glean Salvo, NP  tiZANidine (ZANAFLEX) 4 MG tablet Take 1 tablet (4 mg total) by mouth every 8 (eight) hours as needed for muscle spasms. 08/11/20   Glean Salvo, NP  traMADol (ULTRAM) 50 MG tablet Take 1 tablet by mouth every 6 (six) hours as needed. 11/22/20   [provider]  White Petrolatum-Mineral Oil (WH PETROL-MINERAL OIL-LANOLIN) 0.1-0.1 % OINT Place 1 application into both eyes at bedtime. 04/28/19   [provider]      Allergies    Penicillins, Buprenorphine hcl, Morphine and codeine, and Hydrocodone-acetaminophen    Review of Systems   Review of Systems  Physical Exam Updated Vital Signs BP (!) 120/97   Pulse 92   Temp 98.2 F (36.8 C) (Oral)   Resp (!) 22   Wt 43.1 kg   LMP 09/06/2023 (Exact Date)   SpO2 99%   BMI 17.38 kg/m  Physical Exam Constitutional:      General: She is not in acute distress. HENT:     Head: Normocephalic and atraumatic.  Eyes:     Conjunctiva/sclera: Conjunctivae normal.     Pupils: Pupils are equal, round, and reactive to light.  Cardiovascular:     Rate and Rhythm: Normal rate and regular rhythm.  Pulmonary:     Effort: Pulmonary effort is normal. No respiratory distress.     Comments: No respiratory distress, no audible wheezing Abdominal:     General: There is no distension.     Tenderness: There is no abdominal tenderness.     Comments: Urostomy bag  Skin:    General: Skin is warm and dry.     Comments: Vitiligo  Neurological:     General: No focal deficit present.     Mental Status: She is alert. Mental  status is at baseline.  Psychiatric:        Mood and Affect: Mood normal.        Behavior: Behavior normal.     ED Results / Procedures / Treatments   Labs (all labs ordered are listed, but only abnormal results are displayed) Labs Reviewed  BASIC METABOLIC PANEL - Abnormal; Notable for the following components:      Result Value   Potassium 3.2 (*)    Chloride 112 (*)    CO2 17 (*)    Calcium 8.7 (*)    All other components within normal limits  CBC - Abnormal; Notable for the following components:   Hemoglobin 11.7 (*)    HCT 35.4 (*)    RDW 17.3 (*)    All other  components within normal limits  PHOSPHORUS - Abnormal; Notable for the following components:   Phosphorus 1.0 (*)    All other components within normal limits  PREGNANCY, URINE  URINALYSIS, ROUTINE W REFLEX MICROSCOPIC  TROPONIN I (HIGH SENSITIVITY)    EKG EKG Interpretation Date/Time:  Thursday September 19 2023 20:42:44 EDT Ventricular Rate:  100 PR Interval:  136 QRS Duration:  91 QT Interval:  330 QTC Calculation: 426 R Axis:   39  Text Interpretation: Sinus tachycardia Nonspecific T abnormalities, anterior leads Confirmed by Alvester Chou [phone]) on 09/19/2023 8:53:17 PM  Radiology DG Chest 2 View Result Date: 09/19/2023 CLINICAL DATA:  Status post recent iron infusion presenting with left lower lung pain. EXAM: CHEST - 2 VIEW COMPARISON:  September 05, 2023 FINDINGS: The heart size and mediastinal contours are within normal limits. A radiopaque loop recorder device is noted. Both lungs are clear. The visualized skeletal structures are unremarkable. IMPRESSION: No active cardiopulmonary disease. Electronically Signed   By: Aram Candela M.D.   On: 09/19/2023 21:34    Procedures Procedures  {Document cardiac monitor, telemetry assessment procedure when appropriate:1}  Medications Ordered in ED Medications  phosphorus (K PHOS NEUTRAL) tablet 750 mg (has no administration in time range)    ED Course/  Medical Decision Making/ A&P Clinical Course as of 09/19/23 2250  Thu Sep 19, 2023  2234 Only phos product available at [facility] currently is oral Kphos, which has been ordered [MT]    Clinical Course User Index [MT] Saadia Dewitt, Kermit Balo, MD   {   Click here for ABCD2, HEART and other calculatorsREFRESH Note before signing :1}                              Medical Decision Making Amount and/or Complexity of Data Reviewed Labs: ordered. Radiology: ordered.  Risk Prescription drug management.   This patient presents to the ED with concern for concern for abnormal outpatient lab testing and ongoing shortness of breath for several months. This involves an extensive number of treatment options, and is a complaint that carries with it a high risk of complications and morbidity.    Patient is noted to have hypophosphatemia on her outpatient lab testing performed today and referred in urgently by her nephrologist to the ED.  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  The pertinent results include:  Phos < 1.0, K 3.2, Cr 0.63, Ca 8.7, Trop 2, Hgb 11.7 (baseline)  I ordered imaging studies including dg chest I independently visualized and interpreted imaging which showed no emergent findings I agree with the radiologist interpretation  The patient was maintained on a cardiac monitor.  I personally viewed and interpreted the cardiac monitored which showed an underlying rhythm of: Sinus rhythm  Per my interpretation the patient's ECG shows no acute ischemic findings;  Qtc 426  I ordered medication including K Phos for hypokalemia and hypophosphatemia  I have reviewed the patients home medicines and have made adjustments as needed  Test Considered: ***  I requested consultation with the ***,  and discussed lab  and imaging findings as well as pertinent plan - they recommend: ***  After the interventions noted above, I reevaluated the patient and found that they have: {resolved/improved/worsened:23923::"improved"}  Social Determinants of Health:***  Dispostion:  After consideration of the diagnostic results and the patients response to treatment, I feel that the patent would benefit from ***.   {Document critical care time when appropriate:1} {Document review of labs and clinical decision tools ie heart score, Chads2Vasc2 etc:1}  {Document your independent review of radiology images, and any outside records:1} {Document your discussion with family members, caretakers, and with consultants:1} {Document social determinants of health affecting pt's care:1} {Document your decision making why or why not admission, treatments were needed:1} Final Clinical Impression(s) / ED Diagnoses Final diagnoses:  None    Rx / DC Orders ED Discharge Orders     None

## 2023-09-19 NOTE — ED Notes (Signed)
-  [employer] stated it would be awhile for transportation so added patient to [facility]'s transportation list.

## 2023-09-19 NOTE — ED Notes (Signed)
 Patient going to [facility] room 759. Dr. Vena Austria Kondreddi accepting. Report [phone].

## 2023-09-19 NOTE — ED Triage Notes (Signed)
 Iron infusion yesterday. Today woke up with L lower lung pain. Labs today in clinic show undetectable phos, potassium instructed to come to ED.  Reports chest pain and mild SOB for four months now.

## 2023-09-19 NOTE — ED Notes (Signed)
-  Called [facility] line at 1006pm for admission.

## 2023-09-20 DIAGNOSIS — R0602 Shortness of breath: Secondary | ICD-10-CM | POA: Diagnosis not present

## 2023-09-20 DIAGNOSIS — R799 Abnormal finding of blood chemistry, unspecified: Secondary | ICD-10-CM | POA: Diagnosis present

## 2023-09-20 DIAGNOSIS — R071 Chest pain on breathing: Secondary | ICD-10-CM | POA: Diagnosis not present

## 2023-09-20 LAB — PREGNANCY, URINE: Preg Test, Ur: NEGATIVE

## 2023-09-20 LAB — URINALYSIS, ROUTINE W REFLEX MICROSCOPIC
Bilirubin Urine: NEGATIVE
Glucose, UA: NEGATIVE mg/dL
Ketones, ur: NEGATIVE mg/dL
Nitrite: NEGATIVE
Protein, ur: 30 mg/dL — AB
Specific Gravity, Urine: 1.011 (ref 1.005–1.030)
WBC, UA: >50 WBC/hpf (ref 0–5)
pH: 6.5 (ref 5.0–8.0)

## 2023-09-20 MED ORDER — ACETAMINOPHEN 325 MG PO TABS
650.0000 mg | ORAL_TABLET | Freq: Once | ORAL | Status: AC
  Administered 2023-09-20: 650 mg via ORAL
  Filled 2023-09-20: qty 2

## 2023-11-30 ENCOUNTER — Encounter (HOSPITAL_BASED_OUTPATIENT_CLINIC_OR_DEPARTMENT_OTHER): Payer: Self-pay

## 2023-11-30 ENCOUNTER — Emergency Department (HOSPITAL_BASED_OUTPATIENT_CLINIC_OR_DEPARTMENT_OTHER)
Admission: EM | Admit: 2023-11-30 | Discharge: 2023-11-30 | Discharge status: Against Medical Advice | Attending: Emergency Medicine | Admitting: Emergency Medicine

## 2023-11-30 DIAGNOSIS — D72819 Decreased white blood cell count, unspecified: Secondary | ICD-10-CM | POA: Insufficient documentation

## 2023-11-30 LAB — BASIC METABOLIC PANEL WITH GFR
Anion gap: 10 (ref 5–15)
BUN: 10 mg/dL (ref 6–20)
CO2: 18 mmol/L — ABNORMAL LOW (ref 22–32)
Calcium: 8.5 mg/dL — ABNORMAL LOW (ref 8.9–10.3)
Chloride: 111 mmol/L (ref 98–111)
Creatinine, Ser: 0.67 mg/dL (ref 0.44–1.00)
GFR, Estimated: >60 mL/min (ref >60)
Glucose, Bld: 89 mg/dL (ref 70–99)
Potassium: 3.3 mmol/L — ABNORMAL LOW (ref 3.5–5.1)
Sodium: 139 mmol/L (ref 135–145)

## 2023-11-30 LAB — CBC WITH DIFFERENTIAL/PLATELET
Abs Immature Granulocytes: 0.02 10*3/uL (ref 0.00–0.07)
Basophils Absolute: 0 10*3/uL (ref 0.0–0.1)
Basophils Relative: 0 %
Eosinophils Absolute: 0 10*3/uL (ref 0.0–0.5)
Eosinophils Relative: 1 %
HCT: 36 % (ref 36.0–46.0)
Hemoglobin: 12 g/dL (ref 12.0–15.0)
Immature Granulocytes: 0 %
Lymphocytes Relative: 27 %
Lymphs Abs: 1.4 10*3/uL (ref 0.7–4.0)
MCH: 29.8 pg (ref 26.0–34.0)
MCHC: 33.3 g/dL (ref 30.0–36.0)
MCV: 89.3 fL (ref 80.0–100.0)
Monocytes Absolute: 0.3 10*3/uL (ref 0.1–1.0)
Monocytes Relative: 5 %
Neutro Abs: 3.4 10*3/uL (ref 1.7–7.7)
Neutrophils Relative %: 67 %
Platelets: 182 10*3/uL (ref 150–400)
RBC: 4.03 MIL/uL (ref 3.87–5.11)
RDW: 16 % — ABNORMAL HIGH (ref 11.5–15.5)
WBC: 5.1 10*3/uL (ref 4.0–10.5)
nRBC: 0 % (ref 0.0–0.2)

## 2023-11-30 LAB — MAGNESIUM: Magnesium: 1.6 mg/dL — ABNORMAL LOW (ref 1.7–2.4)

## 2023-11-30 LAB — PHOSPHORUS: Phosphorus: <1 mg/dL — CL (ref 2.5–4.6)

## 2023-11-30 MED ORDER — SODIUM CHLORIDE 0.9 % IV SOLN: INTRAVENOUS | Status: DC | PRN

## 2023-11-30 MED ORDER — MAGNESIUM SULFATE 2 GM/50ML IV SOLN
2.0000 g | Freq: Once | INTRAVENOUS | Status: AC
  Administered 2023-11-30: 2 g via INTRAVENOUS
  Filled 2023-11-30: qty 50

## 2023-11-30 NOTE — Discharge Instructions (Signed)
 If you change your mind about admission, I would recommend going directly to [facility] regional for admission.  Return to the emergency room if you have any worsening symptoms.  Otherwise follow-up with your GI doctor on Monday.

## 2023-11-30 NOTE — ED Notes (Signed)
 Pt adamant about not being admitted to the hospital and wanting to go home. She verbalizes understanding of the risks of leaving AMA. Pt states she will present herself for admission tomorrow night after an event. Signatures of AMA acknowledgement obtained.

## 2023-11-30 NOTE — ED Triage Notes (Signed)
 Pt states that her doctor called to inform her about her labs being off. States that her phosphorus levels are low.

## 2023-11-30 NOTE — ED Provider Notes (Signed)
 [place] EMERGENCY DEPARTMENT AT [facility] Provider Note   CSN: [id number] Arrival date & time: 11/30/23  1031     History  Chief Complaint  Patient presents with   Abdominal Pain    [name] is a 33 y.o. female.  Patient is a 33 year old female who presents with low blood counts.  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  She has had persistently low phosphorus levels.  She is taking oral phosphorus replacement but she is having a hard time with absorption.  She recently was admitted to [facility] for low phosphate levels.  She has been working with her gastroenterologist to try to get outpatient infusions but so far they have not been able to set them up.  She comes back today because she was called 2 days ago because her phosphorus levels were low.  She was hoping that she could get an infusion and go home.  She does not feel bad.  She denies any diarrhea or increased output from her colostomy.  No fevers.         Home Medications Prior to Admission medications   Medication Sig Start Date End Date Taking? Authorizing Provider  AMBULATORY NON FORMULARY MEDICATION Iron  Infusions every 3 months done at [facility]- ordered thru [facility]    [provider]  cetirizine (ZYRTEC) 5 MG tablet Take 5 mg by mouth daily.    [provider]  cholestyramine  (QUESTRAN ) 4 GM/DOSE powder Take 1 packet (4 g total) by mouth daily. 09/25/21 10/25/21  Janel Medford, MD  clindamycin  (CLEOCIN  T) 1 % external solution Apply a thin layer to the face/chest/back every morning. Do not rinse off. 08/30/20   [provider]  emtricitabine-tenofovir (TRUVADA) 200-300 MG tablet Take 1 tablet by mouth daily. 11/23/20   [provider]  EPINEPHrine  0.3 mg/0.3 mL IJ SOAJ injection Inject 0.3 mg into the muscle as needed for anaphylaxis. 03/31/19   [provider]  Erenumab -aooe (AIMOVIG ) 70 MG/ML SOAJ  Inject 70 mg into the skin at bedtime. 08/11/20   Wess Hammed, NP  Fluocinolone Acetonide Body 0.01 % OIL Apply to scalp daily as needed for scalp lesions/itching. 08/30/20   [provider]  hydroxychloroquine (PLAQUENIL) 200 MG tablet Take 400 mg by mouth every morning.     [provider]  hydroxypropyl methylcellulose / hypromellose (ISOPTO TEARS / GONIOVISC) 2.5 % ophthalmic solution Place 2 drops into both eyes 3 (three) times daily as needed for dry eyes.  04/27/19   [provider]  levothyroxine  (SYNTHROID ) 100 MCG tablet Take 112 mcg by mouth daily. 02/27/17   [provider]  mirtazapine (REMERON) 15 MG tablet Take 1 tablet by mouth daily. 11/22/20   [provider]  norethindrone -ethinyl estradiol  1/35 (ORTHO-NOVUM) tablet Take 1 tablet by mouth daily for 7 days. 02/04/23 02/11/23  [place] Butter, PA  omalizumab (XOLAIR) 150 MG/ML prefilled syringe Inject 150 mg into the skin every 14 (fourteen) days.    [provider]  omeprazole (PRILOSEC) 20 MG capsule Take 20 mg by mouth at bedtime.     [provider]  ondansetron  (ZOFRAN -ODT) 8 MG disintegrating tablet Take 1 tablet (8 mg total) by mouth every 8 (eight) hours as needed. 08/11/20   Wess Hammed, NP  pantoprazole  (PROTONIX ) 40 MG tablet Take 1 tablet by mouth daily. 11/23/20   [provider]  prazosin (MINIPRESS) 5 MG capsule  Take 1 capsule by mouth in the morning and at bedtime. 11/23/20   [provider]  predniSONE (DELTASONE) 5 MG tablet Take 1 tablet by mouth as needed. 04/27/19   [provider]  propranolol  ER (INDERAL  LA) 80 MG 24 hr capsule Take 1 capsule (80 mg total) by mouth at bedtime. 08/11/20   Wess Hammed, NP  SUMAtriptan  (IMITREX ) 6 MG/0.5ML SOLN injection Inject 0.5 mLs (6 mg total) into the skin every 2 (two) hours as needed for migraine or headache. May repeat in 2 hours if headache persists or recurs. 08/11/20   Wess Hammed,  NP  tiZANidine  (ZANAFLEX ) 4 MG tablet Take 1 tablet (4 mg total) by mouth every 8 (eight) hours as needed for muscle spasms. 08/11/20   Wess Hammed, NP  traMADol (ULTRAM) 50 MG tablet Take 1 tablet by mouth every 6 (six) hours as needed. 11/22/20   [provider]  White Petrolatum-Mineral Oil (WH PETROL-MINERAL OIL-LANOLIN) 0.1-0.1 % OINT Place 1 application into both eyes at bedtime. 04/28/19   [provider]      Allergies    Penicillins, Buprenorphine hcl, Morphine  and codeine, and Hydrocodone -acetaminophen     Review of Systems   Review of Systems  Constitutional:  Negative for chills, diaphoresis, fatigue and fever.  HENT:  Negative for congestion, rhinorrhea and sneezing.   Eyes: Negative.   Respiratory:  Negative for cough, chest tightness and shortness of breath.   Cardiovascular:  Negative for chest pain and leg swelling.  Gastrointestinal:  Negative for abdominal pain, blood in stool, diarrhea, nausea and vomiting.  Genitourinary:  Negative for difficulty urinating, flank pain, frequency and hematuria.  Musculoskeletal:  Negative for arthralgias and back pain.  Skin:  Negative for rash.  Neurological:  Negative for dizziness, speech difficulty, weakness, numbness and headaches.    Physical Exam Updated Vital Signs BP (!) 124/91 (BP Location: Right Arm)   Pulse 69   Temp 98.1 F (36.7 C) (Oral)   Resp 19   Ht 5\' 1"  (1.549 m)   Wt 45.4 kg   LMP 11/03/2023 (Approximate)   SpO2 100%   BMI 18.89 kg/m  Physical Exam Constitutional:      Appearance: She is well-developed.  HENT:     Head: Normocephalic and atraumatic.  Eyes:     Pupils: Pupils are equal, round, and reactive to light.  Cardiovascular:     Rate and Rhythm: Normal rate and regular rhythm.     Heart sounds: Normal heart sounds.  Pulmonary:     Effort: Pulmonary effort is normal. No respiratory distress.     Breath sounds: Normal breath sounds. No wheezing or rales.  Chest:      Chest wall: No tenderness.  Abdominal:     General: Bowel sounds are normal.     Palpations: Abdomen is soft.     Tenderness: There is no abdominal tenderness. There is no guarding or rebound.     Comments: Colostomy in place  Musculoskeletal:        General: Normal range of motion.     Cervical back: Normal range of motion and neck supple.  Lymphadenopathy:     Cervical: No cervical adenopathy.  Skin:    General: Skin is warm and dry.     Findings: No rash.  Neurological:     Mental Status: She is alert and oriented to person, place, and time.     ED Results / Procedures / Treatments   Labs (all labs ordered  are listed, but only abnormal results are displayed) Labs Reviewed  CBC WITH DIFFERENTIAL/PLATELET - Abnormal; Notable for the following components:      Result Value   RDW 16.0 (*)    All other components within normal limits  MAGNESIUM  - Abnormal; Notable for the following components:   Magnesium  1.6 (*)    All other components within normal limits  PHOSPHORUS - Abnormal; Notable for the following components:   Phosphorus <1.0 (*)    All other components within normal limits  BASIC METABOLIC PANEL WITH GFR - Abnormal; Notable for the following components:   Potassium 3.3 (*)    CO2 18 (*)    Calcium  8.5 (*)    All other components within normal limits    EKG EKG Interpretation Date/Time:  Saturday Nov 30 2023 10:53:05 EDT Ventricular Rate:  63 PR Interval:  146 QRS Duration:  84 QT Interval:  365 QTC Calculation: 374 R Axis:   49  Text Interpretation: Sinus rhythm since last tracing no significant change Confirmed by Hershel Los [phone]) on 11/30/2023 11:07:44 AM  Radiology No results found.  Procedures Procedures    Medications Ordered in ED Medications  magnesium  sulfate IVPB 2 g 50 mL (2 g Intravenous New Bag/Given 11/30/23 1227)  0.9 %  sodium chloride  infusion ( Intravenous New Bag/Given 11/30/23 1225)    ED Course/ Medical Decision Making/  A&P                                 Medical Decision Making Amount and/or Complexity of Data Reviewed Labs: ordered.  Risk Prescription drug management.   Patient is a 33 year old female who presents with electrolyte abnormalities.  She has history of similar recurrent issues given her short gut syndrome.  Her gastroenterologist on chart review is trying to set up outpatient infusions but has not been successful as of yet.  She had a recent admission for severely low phosphate and magnesium  levels.  Today her levels are equally low.  She is asymptomatic.  She does not have any increase in stool output.  She was given some magnesium  replacement here.  However we do not have IV phosphorus replacement.  I did recommend admission for further treatment.  At this point she is refusing.  She says she has stuff to do today and tomorrow but she can probably be admitted tomorrow.  I did advise her that these electrolyte abnormalities are severe and she would have to leave AMA.  She is going to leave AMA but says that she may return for admission tomorrow.  Would recommend that she goes directly to [facility] for admission.  She acknowledges this.  Otherwise she was advised to talk to her gastroenterologist on Monday.  Return precautions were given.  Final Clinical Impression(s) / ED Diagnoses Final diagnoses:  Hypophosphatemia  Hypomagnesemia    Rx / DC Orders ED Discharge Orders     None         Hershel Los, MD 11/30/23 1315

## 2024-01-27 ENCOUNTER — Other Ambulatory Visit: Payer: Self-pay

## 2024-01-27 ENCOUNTER — Ambulatory Visit: Admitting: Physical Therapy

## 2024-01-27 ENCOUNTER — Encounter: Payer: Self-pay | Admitting: Physical Therapy

## 2024-01-27 DIAGNOSIS — L905 Scar conditions and fibrosis of skin: Secondary | ICD-10-CM | POA: Insufficient documentation

## 2024-01-27 DIAGNOSIS — R102 Pelvic and perineal pain: Secondary | ICD-10-CM | POA: Diagnosis present

## 2024-01-27 DIAGNOSIS — R252 Cramp and spasm: Secondary | ICD-10-CM | POA: Insufficient documentation
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
## 2024-01-27 NOTE — Addendum Note (Signed)
 Addended by: ELNOR ROSELLA F on: 01/27/2024 04:15 PM   Modules accepted: Orders

## 2024-02-03 ENCOUNTER — Ambulatory Visit

## 2024-02-03 ENCOUNTER — Telehealth: Payer: Self-pay

## 2024-02-03 DIAGNOSIS — L905 Scar conditions and fibrosis of skin: Secondary | ICD-10-CM | POA: Insufficient documentation

## 2024-02-03 DIAGNOSIS — R102 Pelvic and perineal pain: Secondary | ICD-10-CM | POA: Insufficient documentation

## 2024-02-03 DIAGNOSIS — R252 Cramp and spasm: Secondary | ICD-10-CM | POA: Insufficient documentation
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
## 2024-02-03 NOTE — Telephone Encounter (Signed)
 Called and spoke with patient after no-show appointment. She confirmed her next appointment for pelvic floor physical therapy on 02/26/24 at 11:45.

## 2024-02-26 ENCOUNTER — Encounter: Payer: Self-pay | Admitting: Physical Therapy

## 2024-02-26 ENCOUNTER — Ambulatory Visit: Payer: Self-pay | Admitting: Physical Therapy

## 2024-02-26 DIAGNOSIS — R252 Cramp and spasm: Secondary | ICD-10-CM | POA: Diagnosis present

## 2024-02-26 DIAGNOSIS — R102 Pelvic and perineal pain: Secondary | ICD-10-CM | POA: Diagnosis present

## 2024-02-26 DIAGNOSIS — L905 Scar conditions and fibrosis of skin: Secondary | ICD-10-CM | POA: Diagnosis present
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
## 2024-03-04 ENCOUNTER — Ambulatory Visit: Admitting: Physical Therapy

## 2024-03-04 ENCOUNTER — Encounter: Payer: Self-pay | Admitting: Physical Therapy

## 2024-03-04 DIAGNOSIS — R252 Cramp and spasm: Secondary | ICD-10-CM | POA: Insufficient documentation

## 2024-03-04 DIAGNOSIS — L905 Scar conditions and fibrosis of skin: Secondary | ICD-10-CM | POA: Diagnosis present

## 2024-03-04 DIAGNOSIS — R102 Pelvic and perineal pain: Secondary | ICD-10-CM | POA: Insufficient documentation
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
## 2024-03-12 ENCOUNTER — Ambulatory Visit

## 2024-03-12 DIAGNOSIS — R252 Cramp and spasm: Secondary | ICD-10-CM | POA: Diagnosis not present

## 2024-03-12 DIAGNOSIS — R102 Pelvic and perineal pain: Secondary | ICD-10-CM
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
## 2024-03-19 ENCOUNTER — Ambulatory Visit

## 2024-03-19 DIAGNOSIS — R252 Cramp and spasm: Secondary | ICD-10-CM

## 2024-03-19 DIAGNOSIS — L905 Scar conditions and fibrosis of skin: Secondary | ICD-10-CM

## 2024-03-19 DIAGNOSIS — R102 Pelvic and perineal pain: Secondary | ICD-10-CM
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
## 2024-03-25 ENCOUNTER — Ambulatory Visit: Payer: Self-pay

## 2024-03-25 ENCOUNTER — Ambulatory Visit: Admitting: Physical Therapy

## 2024-03-25 DIAGNOSIS — L905 Scar conditions and fibrosis of skin: Secondary | ICD-10-CM

## 2024-03-25 DIAGNOSIS — R252 Cramp and spasm: Secondary | ICD-10-CM

## 2024-03-25 DIAGNOSIS — R102 Pelvic and perineal pain: Secondary | ICD-10-CM
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
## 2024-04-01 ENCOUNTER — Encounter: Admitting: Physical Therapy

## 2024-04-01 ENCOUNTER — Ambulatory Visit: Payer: Self-pay

## 2024-04-01 DIAGNOSIS — L905 Scar conditions and fibrosis of skin: Secondary | ICD-10-CM | POA: Diagnosis present

## 2024-04-01 DIAGNOSIS — R252 Cramp and spasm: Secondary | ICD-10-CM | POA: Insufficient documentation

## 2024-04-01 DIAGNOSIS — R102 Pelvic and perineal pain unspecified side: Secondary | ICD-10-CM | POA: Diagnosis present
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
## 2024-04-06 ENCOUNTER — Ambulatory Visit: Payer: Self-pay

## 2024-04-06 DIAGNOSIS — L905 Scar conditions and fibrosis of skin: Secondary | ICD-10-CM

## 2024-04-06 DIAGNOSIS — R252 Cramp and spasm: Secondary | ICD-10-CM | POA: Diagnosis not present

## 2024-04-06 DIAGNOSIS — R102 Pelvic and perineal pain unspecified side: Secondary | ICD-10-CM
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
## 2024-04-08 ENCOUNTER — Encounter: Admitting: Physical Therapy

## 2024-04-23 ENCOUNTER — Ambulatory Visit: Payer: Self-pay

## 2024-04-29 ENCOUNTER — Ambulatory Visit

## 2024-04-29 DIAGNOSIS — R252 Cramp and spasm: Secondary | ICD-10-CM

## 2024-04-29 DIAGNOSIS — L905 Scar conditions and fibrosis of skin: Secondary | ICD-10-CM

## 2024-04-29 DIAGNOSIS — R102 Pelvic and perineal pain unspecified side: Secondary | ICD-10-CM
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
## 2024-05-06 ENCOUNTER — Ambulatory Visit: Payer: Self-pay

## 2024-05-11 ENCOUNTER — Ambulatory Visit: Payer: Self-pay

## 2024-05-11 DIAGNOSIS — R252 Cramp and spasm: Secondary | ICD-10-CM | POA: Diagnosis present

## 2024-05-11 DIAGNOSIS — L905 Scar conditions and fibrosis of skin: Secondary | ICD-10-CM | POA: Insufficient documentation

## 2024-05-11 DIAGNOSIS — R102 Pelvic and perineal pain unspecified side: Secondary | ICD-10-CM | POA: Diagnosis present
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
## 2024-05-20 ENCOUNTER — Ambulatory Visit: Payer: Self-pay

## 2024-05-20 DIAGNOSIS — R102 Pelvic and perineal pain unspecified side: Secondary | ICD-10-CM

## 2024-05-20 DIAGNOSIS — R252 Cramp and spasm: Secondary | ICD-10-CM | POA: Diagnosis not present

## 2024-05-20 DIAGNOSIS — L905 Scar conditions and fibrosis of skin: Secondary | ICD-10-CM
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
## 2024-05-25 ENCOUNTER — Ambulatory Visit

## 2024-05-25 DIAGNOSIS — L905 Scar conditions and fibrosis of skin: Secondary | ICD-10-CM

## 2024-05-25 DIAGNOSIS — R252 Cramp and spasm: Secondary | ICD-10-CM

## 2024-05-25 DIAGNOSIS — R102 Pelvic and perineal pain unspecified side: Secondary | ICD-10-CM
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
## 2024-06-01 ENCOUNTER — Ambulatory Visit

## 2024-06-01 DIAGNOSIS — L905 Scar conditions and fibrosis of skin: Secondary | ICD-10-CM | POA: Diagnosis present

## 2024-06-01 DIAGNOSIS — R252 Cramp and spasm: Secondary | ICD-10-CM | POA: Diagnosis present

## 2024-06-01 DIAGNOSIS — R102 Pelvic and perineal pain unspecified side: Secondary | ICD-10-CM | POA: Insufficient documentation
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
## 2024-06-08 ENCOUNTER — Ambulatory Visit

## 2024-06-15 ENCOUNTER — Encounter

## 2024-08-13 ENCOUNTER — Ambulatory Visit (INDEPENDENT_AMBULATORY_CARE_PROVIDER_SITE_OTHER): Admitting: Otolaryngology
